# Patient Record
Sex: Female | Born: 1945 | Race: White | Hispanic: No | Smoking: Never smoker
Health system: Southern US, Community
[De-identification: ages and names within clinical notes are randomized; demographics above are authoritative.]

## PROBLEM LIST (undated history)

## (undated) DIAGNOSIS — I4891 Unspecified atrial fibrillation: Secondary | ICD-10-CM

## (undated) DIAGNOSIS — I10 Essential (primary) hypertension: Secondary | ICD-10-CM

## (undated) DIAGNOSIS — I499 Cardiac arrhythmia, unspecified: Secondary | ICD-10-CM

## (undated) DIAGNOSIS — M1990 Unspecified osteoarthritis, unspecified site: Secondary | ICD-10-CM

## (undated) DIAGNOSIS — F40240 Claustrophobia: Secondary | ICD-10-CM

## (undated) DIAGNOSIS — H9190 Unspecified hearing loss, unspecified ear: Secondary | ICD-10-CM

## (undated) DIAGNOSIS — Z87442 Personal history of urinary calculi: Secondary | ICD-10-CM

## (undated) HISTORY — PX: CHOLECYSTECTOMY: SHX55

---
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
## 2015-01-02 DEATH — deceased

## 2016-03-31 DIAGNOSIS — I129 Hypertensive chronic kidney disease with stage 1 through stage 4 chronic kidney disease, or unspecified chronic kidney disease: Secondary | ICD-10-CM | POA: Diagnosis not present

## 2016-03-31 DIAGNOSIS — G629 Polyneuropathy, unspecified: Secondary | ICD-10-CM | POA: Diagnosis not present

## 2016-03-31 DIAGNOSIS — Z23 Encounter for immunization: Secondary | ICD-10-CM | POA: Diagnosis not present

## 2016-03-31 DIAGNOSIS — Z6841 Body Mass Index (BMI) 40.0 and over, adult: Secondary | ICD-10-CM | POA: Diagnosis not present

## 2016-03-31 DIAGNOSIS — N182 Chronic kidney disease, stage 2 (mild): Secondary | ICD-10-CM | POA: Diagnosis not present

## 2016-04-05 DIAGNOSIS — G629 Polyneuropathy, unspecified: Secondary | ICD-10-CM | POA: Diagnosis not present

## 2016-04-05 DIAGNOSIS — Z6841 Body Mass Index (BMI) 40.0 and over, adult: Secondary | ICD-10-CM | POA: Diagnosis not present

## 2016-04-05 DIAGNOSIS — I129 Hypertensive chronic kidney disease with stage 1 through stage 4 chronic kidney disease, or unspecified chronic kidney disease: Secondary | ICD-10-CM | POA: Diagnosis not present

## 2016-04-05 DIAGNOSIS — Z79899 Other long term (current) drug therapy: Secondary | ICD-10-CM | POA: Diagnosis not present

## 2016-04-05 DIAGNOSIS — H9190 Unspecified hearing loss, unspecified ear: Secondary | ICD-10-CM | POA: Diagnosis not present

## 2016-04-05 DIAGNOSIS — E538 Deficiency of other specified B group vitamins: Secondary | ICD-10-CM | POA: Diagnosis not present

## 2016-04-05 DIAGNOSIS — N182 Chronic kidney disease, stage 2 (mild): Secondary | ICD-10-CM | POA: Diagnosis not present

## 2016-04-05 DIAGNOSIS — I4891 Unspecified atrial fibrillation: Secondary | ICD-10-CM | POA: Diagnosis not present

## 2016-06-30 DIAGNOSIS — I10 Essential (primary) hypertension: Secondary | ICD-10-CM | POA: Diagnosis not present

## 2016-08-31 DIAGNOSIS — E538 Deficiency of other specified B group vitamins: Secondary | ICD-10-CM | POA: Diagnosis not present

## 2016-08-31 DIAGNOSIS — E7800 Pure hypercholesterolemia, unspecified: Secondary | ICD-10-CM | POA: Diagnosis not present

## 2016-08-31 DIAGNOSIS — R350 Frequency of micturition: Secondary | ICD-10-CM | POA: Diagnosis not present

## 2016-08-31 DIAGNOSIS — I10 Essential (primary) hypertension: Secondary | ICD-10-CM | POA: Diagnosis not present

## 2016-09-19 DIAGNOSIS — Z1231 Encounter for screening mammogram for malignant neoplasm of breast: Secondary | ICD-10-CM | POA: Diagnosis not present

## 2017-02-28 DIAGNOSIS — I10 Essential (primary) hypertension: Secondary | ICD-10-CM | POA: Diagnosis not present

## 2017-02-28 DIAGNOSIS — N182 Chronic kidney disease, stage 2 (mild): Secondary | ICD-10-CM | POA: Diagnosis not present

## 2017-02-28 DIAGNOSIS — F339 Major depressive disorder, recurrent, unspecified: Secondary | ICD-10-CM | POA: Diagnosis not present

## 2017-02-28 DIAGNOSIS — E7800 Pure hypercholesterolemia, unspecified: Secondary | ICD-10-CM | POA: Diagnosis not present

## 2017-02-28 DIAGNOSIS — F330 Major depressive disorder, recurrent, mild: Secondary | ICD-10-CM | POA: Diagnosis not present

## 2017-02-28 DIAGNOSIS — F3160 Bipolar disorder, current episode mixed, unspecified: Secondary | ICD-10-CM | POA: Diagnosis not present

## 2017-02-28 DIAGNOSIS — I129 Hypertensive chronic kidney disease with stage 1 through stage 4 chronic kidney disease, or unspecified chronic kidney disease: Secondary | ICD-10-CM | POA: Diagnosis not present

## 2017-02-28 DIAGNOSIS — E538 Deficiency of other specified B group vitamins: Secondary | ICD-10-CM | POA: Diagnosis not present

## 2017-02-28 DIAGNOSIS — Z1379 Encounter for other screening for genetic and chromosomal anomalies: Secondary | ICD-10-CM | POA: Diagnosis not present

## 2017-02-28 DIAGNOSIS — M79609 Pain in unspecified limb: Secondary | ICD-10-CM | POA: Diagnosis not present

## 2017-04-06 DIAGNOSIS — H6122 Impacted cerumen, left ear: Secondary | ICD-10-CM | POA: Diagnosis not present

## 2017-08-23 DIAGNOSIS — E7800 Pure hypercholesterolemia, unspecified: Secondary | ICD-10-CM | POA: Diagnosis not present

## 2017-08-23 DIAGNOSIS — Z0000 Encounter for general adult medical examination without abnormal findings: Secondary | ICD-10-CM | POA: Diagnosis not present

## 2017-08-23 DIAGNOSIS — N182 Chronic kidney disease, stage 2 (mild): Secondary | ICD-10-CM | POA: Diagnosis not present

## 2017-08-23 DIAGNOSIS — I129 Hypertensive chronic kidney disease with stage 1 through stage 4 chronic kidney disease, or unspecified chronic kidney disease: Secondary | ICD-10-CM | POA: Diagnosis not present

## 2017-08-23 DIAGNOSIS — Z139 Encounter for screening, unspecified: Secondary | ICD-10-CM | POA: Diagnosis not present

## 2017-08-23 DIAGNOSIS — I4891 Unspecified atrial fibrillation: Secondary | ICD-10-CM | POA: Diagnosis not present

## 2017-08-23 DIAGNOSIS — Z1331 Encounter for screening for depression: Secondary | ICD-10-CM | POA: Diagnosis not present

## 2018-01-08 DIAGNOSIS — Z1231 Encounter for screening mammogram for malignant neoplasm of breast: Secondary | ICD-10-CM | POA: Diagnosis not present

## 2018-01-18 DIAGNOSIS — N6489 Other specified disorders of breast: Secondary | ICD-10-CM | POA: Diagnosis not present

## 2018-01-18 DIAGNOSIS — R918 Other nonspecific abnormal finding of lung field: Secondary | ICD-10-CM | POA: Diagnosis not present

## 2018-01-22 DIAGNOSIS — R928 Other abnormal and inconclusive findings on diagnostic imaging of breast: Secondary | ICD-10-CM | POA: Diagnosis not present

## 2018-01-22 DIAGNOSIS — N6489 Other specified disorders of breast: Secondary | ICD-10-CM | POA: Diagnosis not present

## 2018-02-28 DIAGNOSIS — I129 Hypertensive chronic kidney disease with stage 1 through stage 4 chronic kidney disease, or unspecified chronic kidney disease: Secondary | ICD-10-CM | POA: Diagnosis not present

## 2018-02-28 DIAGNOSIS — N182 Chronic kidney disease, stage 2 (mild): Secondary | ICD-10-CM | POA: Diagnosis not present

## 2018-02-28 DIAGNOSIS — Z23 Encounter for immunization: Secondary | ICD-10-CM | POA: Diagnosis not present

## 2018-02-28 DIAGNOSIS — R600 Localized edema: Secondary | ICD-10-CM | POA: Diagnosis not present

## 2018-02-28 DIAGNOSIS — E785 Hyperlipidemia, unspecified: Secondary | ICD-10-CM | POA: Diagnosis not present

## 2018-02-28 DIAGNOSIS — E7800 Pure hypercholesterolemia, unspecified: Secondary | ICD-10-CM | POA: Diagnosis not present

## 2018-03-19 DIAGNOSIS — Z1211 Encounter for screening for malignant neoplasm of colon: Secondary | ICD-10-CM | POA: Diagnosis not present

## 2018-03-19 DIAGNOSIS — Z1212 Encounter for screening for malignant neoplasm of rectum: Secondary | ICD-10-CM | POA: Diagnosis not present

## 2018-04-05 DIAGNOSIS — R600 Localized edema: Secondary | ICD-10-CM | POA: Diagnosis not present

## 2018-04-05 DIAGNOSIS — M7989 Other specified soft tissue disorders: Secondary | ICD-10-CM | POA: Diagnosis not present

## 2018-08-22 DIAGNOSIS — N182 Chronic kidney disease, stage 2 (mild): Secondary | ICD-10-CM | POA: Diagnosis not present

## 2018-08-22 DIAGNOSIS — E538 Deficiency of other specified B group vitamins: Secondary | ICD-10-CM | POA: Diagnosis not present

## 2018-08-22 DIAGNOSIS — E7800 Pure hypercholesterolemia, unspecified: Secondary | ICD-10-CM | POA: Diagnosis not present

## 2018-08-22 DIAGNOSIS — I129 Hypertensive chronic kidney disease with stage 1 through stage 4 chronic kidney disease, or unspecified chronic kidney disease: Secondary | ICD-10-CM | POA: Diagnosis not present

## 2018-08-22 DIAGNOSIS — I4891 Unspecified atrial fibrillation: Secondary | ICD-10-CM | POA: Diagnosis not present

## 2018-09-05 DIAGNOSIS — K625 Hemorrhage of anus and rectum: Secondary | ICD-10-CM | POA: Diagnosis not present

## 2018-09-05 DIAGNOSIS — I4891 Unspecified atrial fibrillation: Secondary | ICD-10-CM | POA: Diagnosis not present

## 2018-09-05 DIAGNOSIS — M170 Bilateral primary osteoarthritis of knee: Secondary | ICD-10-CM | POA: Diagnosis not present

## 2018-09-05 DIAGNOSIS — N183 Chronic kidney disease, stage 3 (moderate): Secondary | ICD-10-CM | POA: Diagnosis not present

## 2018-09-05 DIAGNOSIS — Z1331 Encounter for screening for depression: Secondary | ICD-10-CM | POA: Diagnosis not present

## 2018-09-17 DIAGNOSIS — R609 Edema, unspecified: Secondary | ICD-10-CM | POA: Diagnosis not present

## 2018-09-17 DIAGNOSIS — E278 Other specified disorders of adrenal gland: Secondary | ICD-10-CM | POA: Diagnosis not present

## 2018-09-17 DIAGNOSIS — K625 Hemorrhage of anus and rectum: Secondary | ICD-10-CM | POA: Diagnosis not present

## 2018-09-17 DIAGNOSIS — R262 Difficulty in walking, not elsewhere classified: Secondary | ICD-10-CM | POA: Diagnosis not present

## 2018-09-21 DIAGNOSIS — I129 Hypertensive chronic kidney disease with stage 1 through stage 4 chronic kidney disease, or unspecified chronic kidney disease: Secondary | ICD-10-CM | POA: Diagnosis not present

## 2018-09-21 DIAGNOSIS — I10 Essential (primary) hypertension: Secondary | ICD-10-CM | POA: Diagnosis not present

## 2018-09-21 DIAGNOSIS — N189 Chronic kidney disease, unspecified: Secondary | ICD-10-CM | POA: Diagnosis not present

## 2018-09-21 DIAGNOSIS — K649 Unspecified hemorrhoids: Secondary | ICD-10-CM | POA: Diagnosis not present

## 2018-09-21 DIAGNOSIS — Z7901 Long term (current) use of anticoagulants: Secondary | ICD-10-CM | POA: Diagnosis not present

## 2018-09-21 DIAGNOSIS — R609 Edema, unspecified: Secondary | ICD-10-CM | POA: Diagnosis not present

## 2018-09-21 DIAGNOSIS — R52 Pain, unspecified: Secondary | ICD-10-CM | POA: Diagnosis not present

## 2018-09-21 DIAGNOSIS — R58 Hemorrhage, not elsewhere classified: Secondary | ICD-10-CM | POA: Diagnosis not present

## 2018-09-21 DIAGNOSIS — I4891 Unspecified atrial fibrillation: Secondary | ICD-10-CM | POA: Diagnosis not present

## 2018-09-21 DIAGNOSIS — N938 Other specified abnormal uterine and vaginal bleeding: Secondary | ICD-10-CM | POA: Diagnosis not present

## 2018-09-24 DIAGNOSIS — Z7689 Persons encountering health services in other specified circumstances: Secondary | ICD-10-CM | POA: Diagnosis not present

## 2018-09-24 DIAGNOSIS — I482 Chronic atrial fibrillation, unspecified: Secondary | ICD-10-CM | POA: Diagnosis not present

## 2018-09-24 DIAGNOSIS — Z7189 Other specified counseling: Secondary | ICD-10-CM | POA: Diagnosis not present

## 2018-09-24 DIAGNOSIS — K649 Unspecified hemorrhoids: Secondary | ICD-10-CM | POA: Diagnosis not present

## 2018-11-30 DIAGNOSIS — I4891 Unspecified atrial fibrillation: Secondary | ICD-10-CM | POA: Diagnosis not present

## 2018-11-30 DIAGNOSIS — R609 Edema, unspecified: Secondary | ICD-10-CM | POA: Diagnosis not present

## 2018-11-30 DIAGNOSIS — I878 Other specified disorders of veins: Secondary | ICD-10-CM | POA: Diagnosis not present

## 2018-11-30 DIAGNOSIS — I872 Venous insufficiency (chronic) (peripheral): Secondary | ICD-10-CM | POA: Diagnosis not present

## 2018-12-28 DIAGNOSIS — Z6841 Body Mass Index (BMI) 40.0 and over, adult: Secondary | ICD-10-CM | POA: Diagnosis not present

## 2018-12-28 DIAGNOSIS — E876 Hypokalemia: Secondary | ICD-10-CM | POA: Diagnosis not present

## 2018-12-28 DIAGNOSIS — I4891 Unspecified atrial fibrillation: Secondary | ICD-10-CM

## 2018-12-28 DIAGNOSIS — I480 Paroxysmal atrial fibrillation: Secondary | ICD-10-CM | POA: Diagnosis not present

## 2018-12-28 DIAGNOSIS — I10 Essential (primary) hypertension: Secondary | ICD-10-CM

## 2018-12-28 DIAGNOSIS — I361 Nonrheumatic tricuspid (valve) insufficiency: Secondary | ICD-10-CM | POA: Diagnosis not present

## 2018-12-28 DIAGNOSIS — G4730 Sleep apnea, unspecified: Secondary | ICD-10-CM | POA: Diagnosis not present

## 2018-12-28 DIAGNOSIS — F419 Anxiety disorder, unspecified: Secondary | ICD-10-CM | POA: Diagnosis not present

## 2018-12-28 DIAGNOSIS — I50810 Right heart failure, unspecified: Secondary | ICD-10-CM | POA: Diagnosis not present

## 2018-12-28 DIAGNOSIS — L97919 Non-pressure chronic ulcer of unspecified part of right lower leg with unspecified severity: Secondary | ICD-10-CM | POA: Diagnosis not present

## 2018-12-28 DIAGNOSIS — I340 Nonrheumatic mitral (valve) insufficiency: Secondary | ICD-10-CM | POA: Diagnosis not present

## 2018-12-28 DIAGNOSIS — I110 Hypertensive heart disease with heart failure: Secondary | ICD-10-CM | POA: Diagnosis not present

## 2018-12-28 DIAGNOSIS — I517 Cardiomegaly: Secondary | ICD-10-CM | POA: Diagnosis not present

## 2018-12-28 DIAGNOSIS — I351 Nonrheumatic aortic (valve) insufficiency: Secondary | ICD-10-CM | POA: Diagnosis not present

## 2018-12-28 DIAGNOSIS — Z881 Allergy status to other antibiotic agents status: Secondary | ICD-10-CM | POA: Diagnosis not present

## 2018-12-28 DIAGNOSIS — L89890 Pressure ulcer of other site, unstageable: Secondary | ICD-10-CM | POA: Diagnosis not present

## 2018-12-28 DIAGNOSIS — J9811 Atelectasis: Secondary | ICD-10-CM | POA: Diagnosis not present

## 2018-12-28 DIAGNOSIS — Z888 Allergy status to other drugs, medicaments and biological substances status: Secondary | ICD-10-CM | POA: Diagnosis not present

## 2018-12-28 DIAGNOSIS — I83019 Varicose veins of right lower extremity with ulcer of unspecified site: Secondary | ICD-10-CM | POA: Diagnosis not present

## 2018-12-28 DIAGNOSIS — M1990 Unspecified osteoarthritis, unspecified site: Secondary | ICD-10-CM | POA: Diagnosis not present

## 2018-12-28 DIAGNOSIS — Z79899 Other long term (current) drug therapy: Secondary | ICD-10-CM | POA: Diagnosis not present

## 2018-12-28 DIAGNOSIS — N179 Acute kidney failure, unspecified: Secondary | ICD-10-CM | POA: Diagnosis not present

## 2018-12-28 DIAGNOSIS — H9190 Unspecified hearing loss, unspecified ear: Secondary | ICD-10-CM | POA: Diagnosis not present

## 2018-12-28 DIAGNOSIS — R600 Localized edema: Secondary | ICD-10-CM | POA: Diagnosis not present

## 2018-12-28 DIAGNOSIS — Z880 Allergy status to penicillin: Secondary | ICD-10-CM | POA: Diagnosis not present

## 2018-12-28 DIAGNOSIS — I4811 Longstanding persistent atrial fibrillation: Secondary | ICD-10-CM | POA: Diagnosis not present

## 2019-01-07 DIAGNOSIS — I129 Hypertensive chronic kidney disease with stage 1 through stage 4 chronic kidney disease, or unspecified chronic kidney disease: Secondary | ICD-10-CM | POA: Diagnosis not present

## 2019-01-07 DIAGNOSIS — L97819 Non-pressure chronic ulcer of other part of right lower leg with unspecified severity: Secondary | ICD-10-CM | POA: Diagnosis not present

## 2019-01-07 DIAGNOSIS — E876 Hypokalemia: Secondary | ICD-10-CM | POA: Diagnosis not present

## 2019-01-07 DIAGNOSIS — G629 Polyneuropathy, unspecified: Secondary | ICD-10-CM | POA: Diagnosis not present

## 2019-01-07 DIAGNOSIS — H9190 Unspecified hearing loss, unspecified ear: Secondary | ICD-10-CM | POA: Diagnosis not present

## 2019-01-07 DIAGNOSIS — N182 Chronic kidney disease, stage 2 (mild): Secondary | ICD-10-CM | POA: Diagnosis not present

## 2019-01-07 DIAGNOSIS — I480 Paroxysmal atrial fibrillation: Secondary | ICD-10-CM | POA: Diagnosis not present

## 2019-01-07 DIAGNOSIS — N200 Calculus of kidney: Secondary | ICD-10-CM | POA: Diagnosis not present

## 2019-01-07 DIAGNOSIS — Z7982 Long term (current) use of aspirin: Secondary | ICD-10-CM | POA: Diagnosis not present

## 2019-01-07 DIAGNOSIS — N179 Acute kidney failure, unspecified: Secondary | ICD-10-CM | POA: Diagnosis not present

## 2019-01-07 DIAGNOSIS — I872 Venous insufficiency (chronic) (peripheral): Secondary | ICD-10-CM | POA: Diagnosis not present

## 2019-01-14 DIAGNOSIS — Z7689 Persons encountering health services in other specified circumstances: Secondary | ICD-10-CM | POA: Diagnosis not present

## 2019-01-14 DIAGNOSIS — N183 Chronic kidney disease, stage 3 (moderate): Secondary | ICD-10-CM | POA: Diagnosis not present

## 2019-01-14 DIAGNOSIS — E785 Hyperlipidemia, unspecified: Secondary | ICD-10-CM | POA: Diagnosis not present

## 2019-01-14 DIAGNOSIS — E876 Hypokalemia: Secondary | ICD-10-CM | POA: Diagnosis not present

## 2019-01-14 DIAGNOSIS — I872 Venous insufficiency (chronic) (peripheral): Secondary | ICD-10-CM | POA: Diagnosis not present

## 2019-01-14 DIAGNOSIS — E538 Deficiency of other specified B group vitamins: Secondary | ICD-10-CM | POA: Diagnosis not present

## 2019-01-14 DIAGNOSIS — N179 Acute kidney failure, unspecified: Secondary | ICD-10-CM | POA: Diagnosis not present

## 2019-01-25 DIAGNOSIS — N183 Chronic kidney disease, stage 3 (moderate): Secondary | ICD-10-CM | POA: Diagnosis not present

## 2019-01-25 DIAGNOSIS — R609 Edema, unspecified: Secondary | ICD-10-CM | POA: Diagnosis not present

## 2019-01-25 DIAGNOSIS — Z6841 Body Mass Index (BMI) 40.0 and over, adult: Secondary | ICD-10-CM | POA: Diagnosis not present

## 2019-01-25 DIAGNOSIS — E876 Hypokalemia: Secondary | ICD-10-CM | POA: Diagnosis not present

## 2019-02-01 ENCOUNTER — Other Ambulatory Visit: Payer: Self-pay

## 2019-02-06 DIAGNOSIS — I872 Venous insufficiency (chronic) (peripheral): Secondary | ICD-10-CM | POA: Diagnosis not present

## 2019-02-06 DIAGNOSIS — I129 Hypertensive chronic kidney disease with stage 1 through stage 4 chronic kidney disease, or unspecified chronic kidney disease: Secondary | ICD-10-CM | POA: Diagnosis not present

## 2019-02-06 DIAGNOSIS — L97819 Non-pressure chronic ulcer of other part of right lower leg with unspecified severity: Secondary | ICD-10-CM | POA: Diagnosis not present

## 2019-02-06 DIAGNOSIS — H9190 Unspecified hearing loss, unspecified ear: Secondary | ICD-10-CM | POA: Diagnosis not present

## 2019-02-06 DIAGNOSIS — N179 Acute kidney failure, unspecified: Secondary | ICD-10-CM | POA: Diagnosis not present

## 2019-02-06 DIAGNOSIS — I480 Paroxysmal atrial fibrillation: Secondary | ICD-10-CM | POA: Diagnosis not present

## 2019-02-06 DIAGNOSIS — E876 Hypokalemia: Secondary | ICD-10-CM | POA: Diagnosis not present

## 2019-02-06 DIAGNOSIS — Z7982 Long term (current) use of aspirin: Secondary | ICD-10-CM | POA: Diagnosis not present

## 2019-02-06 DIAGNOSIS — N200 Calculus of kidney: Secondary | ICD-10-CM | POA: Diagnosis not present

## 2019-02-06 DIAGNOSIS — G629 Polyneuropathy, unspecified: Secondary | ICD-10-CM | POA: Diagnosis not present

## 2019-02-06 DIAGNOSIS — N182 Chronic kidney disease, stage 2 (mild): Secondary | ICD-10-CM | POA: Diagnosis not present

## 2019-02-22 DIAGNOSIS — M170 Bilateral primary osteoarthritis of knee: Secondary | ICD-10-CM | POA: Diagnosis not present

## 2019-02-22 DIAGNOSIS — I10 Essential (primary) hypertension: Secondary | ICD-10-CM | POA: Diagnosis not present

## 2019-02-22 DIAGNOSIS — G629 Polyneuropathy, unspecified: Secondary | ICD-10-CM | POA: Diagnosis not present

## 2019-02-22 DIAGNOSIS — L97812 Non-pressure chronic ulcer of other part of right lower leg with fat layer exposed: Secondary | ICD-10-CM | POA: Diagnosis not present

## 2019-02-22 DIAGNOSIS — I87311 Chronic venous hypertension (idiopathic) with ulcer of right lower extremity: Secondary | ICD-10-CM | POA: Diagnosis not present

## 2019-03-01 DIAGNOSIS — I87311 Chronic venous hypertension (idiopathic) with ulcer of right lower extremity: Secondary | ICD-10-CM | POA: Diagnosis not present

## 2019-03-01 DIAGNOSIS — L97812 Non-pressure chronic ulcer of other part of right lower leg with fat layer exposed: Secondary | ICD-10-CM | POA: Diagnosis not present

## 2019-03-08 DIAGNOSIS — L97812 Non-pressure chronic ulcer of other part of right lower leg with fat layer exposed: Secondary | ICD-10-CM | POA: Diagnosis not present

## 2019-03-08 DIAGNOSIS — L97512 Non-pressure chronic ulcer of other part of right foot with fat layer exposed: Secondary | ICD-10-CM | POA: Diagnosis not present

## 2019-03-08 DIAGNOSIS — I87311 Chronic venous hypertension (idiopathic) with ulcer of right lower extremity: Secondary | ICD-10-CM | POA: Diagnosis not present

## 2019-03-15 DIAGNOSIS — I87311 Chronic venous hypertension (idiopathic) with ulcer of right lower extremity: Secondary | ICD-10-CM | POA: Diagnosis not present

## 2019-03-15 DIAGNOSIS — L97812 Non-pressure chronic ulcer of other part of right lower leg with fat layer exposed: Secondary | ICD-10-CM | POA: Diagnosis not present

## 2019-04-05 DIAGNOSIS — L97812 Non-pressure chronic ulcer of other part of right lower leg with fat layer exposed: Secondary | ICD-10-CM | POA: Diagnosis not present

## 2019-04-05 DIAGNOSIS — I87311 Chronic venous hypertension (idiopathic) with ulcer of right lower extremity: Secondary | ICD-10-CM | POA: Diagnosis not present

## 2019-04-09 DIAGNOSIS — I87311 Chronic venous hypertension (idiopathic) with ulcer of right lower extremity: Secondary | ICD-10-CM | POA: Diagnosis not present

## 2019-04-09 DIAGNOSIS — L97812 Non-pressure chronic ulcer of other part of right lower leg with fat layer exposed: Secondary | ICD-10-CM | POA: Diagnosis not present

## 2019-05-03 DIAGNOSIS — I87311 Chronic venous hypertension (idiopathic) with ulcer of right lower extremity: Secondary | ICD-10-CM | POA: Diagnosis not present

## 2019-05-03 DIAGNOSIS — L97819 Non-pressure chronic ulcer of other part of right lower leg with unspecified severity: Secondary | ICD-10-CM | POA: Diagnosis not present

## 2019-05-10 DIAGNOSIS — L97812 Non-pressure chronic ulcer of other part of right lower leg with fat layer exposed: Secondary | ICD-10-CM | POA: Diagnosis not present

## 2019-05-10 DIAGNOSIS — I87331 Chronic venous hypertension (idiopathic) with ulcer and inflammation of right lower extremity: Secondary | ICD-10-CM | POA: Diagnosis not present

## 2019-05-10 DIAGNOSIS — I87311 Chronic venous hypertension (idiopathic) with ulcer of right lower extremity: Secondary | ICD-10-CM | POA: Diagnosis not present

## 2019-05-23 DIAGNOSIS — I87311 Chronic venous hypertension (idiopathic) with ulcer of right lower extremity: Secondary | ICD-10-CM | POA: Diagnosis not present

## 2019-05-23 DIAGNOSIS — L97812 Non-pressure chronic ulcer of other part of right lower leg with fat layer exposed: Secondary | ICD-10-CM | POA: Diagnosis not present

## 2019-06-04 DIAGNOSIS — I87311 Chronic venous hypertension (idiopathic) with ulcer of right lower extremity: Secondary | ICD-10-CM | POA: Diagnosis not present

## 2019-06-04 DIAGNOSIS — L97812 Non-pressure chronic ulcer of other part of right lower leg with fat layer exposed: Secondary | ICD-10-CM | POA: Diagnosis not present

## 2019-06-04 DIAGNOSIS — L97819 Non-pressure chronic ulcer of other part of right lower leg with unspecified severity: Secondary | ICD-10-CM | POA: Diagnosis not present

## 2019-06-07 DIAGNOSIS — I87311 Chronic venous hypertension (idiopathic) with ulcer of right lower extremity: Secondary | ICD-10-CM | POA: Diagnosis not present

## 2019-06-10 DIAGNOSIS — L97819 Non-pressure chronic ulcer of other part of right lower leg with unspecified severity: Secondary | ICD-10-CM | POA: Diagnosis not present

## 2019-06-10 DIAGNOSIS — I87311 Chronic venous hypertension (idiopathic) with ulcer of right lower extremity: Secondary | ICD-10-CM | POA: Diagnosis not present

## 2019-07-01 DIAGNOSIS — Z136 Encounter for screening for cardiovascular disorders: Secondary | ICD-10-CM | POA: Diagnosis not present

## 2019-07-01 DIAGNOSIS — Z23 Encounter for immunization: Secondary | ICD-10-CM | POA: Diagnosis not present

## 2019-07-01 DIAGNOSIS — Z0000 Encounter for general adult medical examination without abnormal findings: Secondary | ICD-10-CM | POA: Diagnosis not present

## 2019-07-01 DIAGNOSIS — Z1231 Encounter for screening mammogram for malignant neoplasm of breast: Secondary | ICD-10-CM | POA: Diagnosis not present

## 2019-07-01 DIAGNOSIS — Z139 Encounter for screening, unspecified: Secondary | ICD-10-CM | POA: Diagnosis not present

## 2019-07-01 DIAGNOSIS — Z1331 Encounter for screening for depression: Secondary | ICD-10-CM | POA: Diagnosis not present

## 2019-07-01 DIAGNOSIS — Z1339 Encounter for screening examination for other mental health and behavioral disorders: Secondary | ICD-10-CM | POA: Diagnosis not present

## 2019-07-03 DIAGNOSIS — N183 Chronic kidney disease, stage 3 unspecified: Secondary | ICD-10-CM | POA: Diagnosis not present

## 2019-07-03 DIAGNOSIS — Z6841 Body Mass Index (BMI) 40.0 and over, adult: Secondary | ICD-10-CM | POA: Diagnosis not present

## 2019-07-03 DIAGNOSIS — I872 Venous insufficiency (chronic) (peripheral): Secondary | ICD-10-CM | POA: Diagnosis not present

## 2019-07-03 DIAGNOSIS — E876 Hypokalemia: Secondary | ICD-10-CM | POA: Diagnosis not present

## 2019-07-11 DIAGNOSIS — I87311 Chronic venous hypertension (idiopathic) with ulcer of right lower extremity: Secondary | ICD-10-CM | POA: Diagnosis not present

## 2019-07-17 DIAGNOSIS — L97812 Non-pressure chronic ulcer of other part of right lower leg with fat layer exposed: Secondary | ICD-10-CM | POA: Diagnosis not present

## 2019-07-17 DIAGNOSIS — I87311 Chronic venous hypertension (idiopathic) with ulcer of right lower extremity: Secondary | ICD-10-CM | POA: Diagnosis not present

## 2019-07-31 DIAGNOSIS — I87311 Chronic venous hypertension (idiopathic) with ulcer of right lower extremity: Secondary | ICD-10-CM | POA: Diagnosis not present

## 2019-07-31 DIAGNOSIS — L97812 Non-pressure chronic ulcer of other part of right lower leg with fat layer exposed: Secondary | ICD-10-CM | POA: Diagnosis not present

## 2019-08-02 DIAGNOSIS — E876 Hypokalemia: Secondary | ICD-10-CM | POA: Diagnosis not present

## 2019-08-02 DIAGNOSIS — N183 Chronic kidney disease, stage 3 unspecified: Secondary | ICD-10-CM | POA: Diagnosis not present

## 2019-08-02 DIAGNOSIS — I872 Venous insufficiency (chronic) (peripheral): Secondary | ICD-10-CM | POA: Diagnosis not present

## 2019-08-19 DIAGNOSIS — I10 Essential (primary) hypertension: Secondary | ICD-10-CM | POA: Diagnosis not present

## 2019-08-19 DIAGNOSIS — Z6841 Body Mass Index (BMI) 40.0 and over, adult: Secondary | ICD-10-CM | POA: Diagnosis not present

## 2019-08-27 DIAGNOSIS — I10 Essential (primary) hypertension: Secondary | ICD-10-CM | POA: Diagnosis not present

## 2019-09-01 DIAGNOSIS — N183 Chronic kidney disease, stage 3 unspecified: Secondary | ICD-10-CM | POA: Diagnosis not present

## 2019-09-01 DIAGNOSIS — I10 Essential (primary) hypertension: Secondary | ICD-10-CM | POA: Diagnosis not present

## 2019-09-01 DIAGNOSIS — I872 Venous insufficiency (chronic) (peripheral): Secondary | ICD-10-CM | POA: Diagnosis not present

## 2019-09-01 DIAGNOSIS — E876 Hypokalemia: Secondary | ICD-10-CM | POA: Diagnosis not present

## 2019-09-02 DIAGNOSIS — Z6841 Body Mass Index (BMI) 40.0 and over, adult: Secondary | ICD-10-CM | POA: Diagnosis not present

## 2019-09-02 DIAGNOSIS — I10 Essential (primary) hypertension: Secondary | ICD-10-CM | POA: Diagnosis not present

## 2019-09-12 DIAGNOSIS — I87311 Chronic venous hypertension (idiopathic) with ulcer of right lower extremity: Secondary | ICD-10-CM | POA: Diagnosis not present

## 2019-09-12 DIAGNOSIS — L97819 Non-pressure chronic ulcer of other part of right lower leg with unspecified severity: Secondary | ICD-10-CM | POA: Diagnosis not present

## 2019-09-18 DIAGNOSIS — Z1231 Encounter for screening mammogram for malignant neoplasm of breast: Secondary | ICD-10-CM | POA: Diagnosis not present

## 2019-10-01 DIAGNOSIS — I10 Essential (primary) hypertension: Secondary | ICD-10-CM | POA: Diagnosis not present

## 2019-10-02 DIAGNOSIS — N183 Chronic kidney disease, stage 3 unspecified: Secondary | ICD-10-CM | POA: Diagnosis not present

## 2019-10-02 DIAGNOSIS — I10 Essential (primary) hypertension: Secondary | ICD-10-CM | POA: Diagnosis not present

## 2019-10-02 DIAGNOSIS — I872 Venous insufficiency (chronic) (peripheral): Secondary | ICD-10-CM | POA: Diagnosis not present

## 2019-10-02 DIAGNOSIS — E876 Hypokalemia: Secondary | ICD-10-CM | POA: Diagnosis not present

## 2019-10-17 ENCOUNTER — Other Ambulatory Visit: Payer: Self-pay | Admitting: Family Medicine

## 2019-10-17 DIAGNOSIS — N6489 Other specified disorders of breast: Secondary | ICD-10-CM | POA: Diagnosis not present

## 2019-10-17 DIAGNOSIS — R928 Other abnormal and inconclusive findings on diagnostic imaging of breast: Secondary | ICD-10-CM

## 2019-10-30 ENCOUNTER — Ambulatory Visit
Admission: RE | Admit: 2019-10-30 | Discharge: 2019-10-30 | Disposition: A | Discharge status: Home / Self Care | Payer: PPO | Source: Ambulatory Visit | Attending: Family Medicine | Admitting: Family Medicine

## 2019-10-30 ENCOUNTER — Other Ambulatory Visit: Payer: Self-pay | Admitting: Family Medicine

## 2019-10-30 ENCOUNTER — Other Ambulatory Visit: Payer: Self-pay

## 2019-10-30 DIAGNOSIS — N6310 Unspecified lump in the right breast, unspecified quadrant: Secondary | ICD-10-CM

## 2019-10-30 DIAGNOSIS — R928 Other abnormal and inconclusive findings on diagnostic imaging of breast: Secondary | ICD-10-CM

## 2019-11-01 DIAGNOSIS — E876 Hypokalemia: Secondary | ICD-10-CM | POA: Diagnosis not present

## 2019-11-01 DIAGNOSIS — M25551 Pain in right hip: Secondary | ICD-10-CM | POA: Diagnosis not present

## 2019-11-01 DIAGNOSIS — I10 Essential (primary) hypertension: Secondary | ICD-10-CM | POA: Diagnosis not present

## 2019-11-01 DIAGNOSIS — N183 Chronic kidney disease, stage 3 unspecified: Secondary | ICD-10-CM | POA: Diagnosis not present

## 2019-11-01 DIAGNOSIS — I872 Venous insufficiency (chronic) (peripheral): Secondary | ICD-10-CM | POA: Diagnosis not present

## 2019-11-04 DIAGNOSIS — N183 Chronic kidney disease, stage 3 unspecified: Secondary | ICD-10-CM | POA: Diagnosis not present

## 2019-11-04 DIAGNOSIS — I10 Essential (primary) hypertension: Secondary | ICD-10-CM | POA: Diagnosis not present

## 2019-11-04 DIAGNOSIS — Z6841 Body Mass Index (BMI) 40.0 and over, adult: Secondary | ICD-10-CM | POA: Diagnosis not present

## 2019-11-08 ENCOUNTER — Emergency Department (HOSPITAL_COMMUNITY): Payer: PPO

## 2019-11-08 ENCOUNTER — Inpatient Hospital Stay (HOSPITAL_COMMUNITY)
Admission: EM | Admit: 2019-11-08 | Discharge: 2019-11-12 | DRG: 660 | Disposition: A | Discharge status: Home Health | Payer: PPO | Source: Ambulatory Visit | Attending: Internal Medicine | Admitting: Internal Medicine

## 2019-11-08 ENCOUNTER — Other Ambulatory Visit: Payer: Self-pay

## 2019-11-08 ENCOUNTER — Encounter (HOSPITAL_COMMUNITY): Payer: Self-pay | Admitting: Emergency Medicine

## 2019-11-08 DIAGNOSIS — E872 Acidosis, unspecified: Secondary | ICD-10-CM

## 2019-11-08 DIAGNOSIS — Z888 Allergy status to other drugs, medicaments and biological substances status: Secondary | ICD-10-CM | POA: Diagnosis not present

## 2019-11-08 DIAGNOSIS — I1310 Hypertensive heart and chronic kidney disease without heart failure, with stage 1 through stage 4 chronic kidney disease, or unspecified chronic kidney disease: Secondary | ICD-10-CM | POA: Diagnosis present

## 2019-11-08 DIAGNOSIS — Z880 Allergy status to penicillin: Secondary | ICD-10-CM | POA: Diagnosis not present

## 2019-11-08 DIAGNOSIS — R5381 Other malaise: Secondary | ICD-10-CM | POA: Diagnosis not present

## 2019-11-08 DIAGNOSIS — N179 Acute kidney failure, unspecified: Principal | ICD-10-CM | POA: Diagnosis present

## 2019-11-08 DIAGNOSIS — N189 Chronic kidney disease, unspecified: Secondary | ICD-10-CM | POA: Diagnosis present

## 2019-11-08 DIAGNOSIS — R1031 Right lower quadrant pain: Secondary | ICD-10-CM | POA: Diagnosis present

## 2019-11-08 DIAGNOSIS — H9190 Unspecified hearing loss, unspecified ear: Secondary | ICD-10-CM | POA: Diagnosis not present

## 2019-11-08 DIAGNOSIS — J90 Pleural effusion, not elsewhere classified: Secondary | ICD-10-CM | POA: Diagnosis not present

## 2019-11-08 DIAGNOSIS — Z9181 History of falling: Secondary | ICD-10-CM

## 2019-11-08 DIAGNOSIS — Z79899 Other long term (current) drug therapy: Secondary | ICD-10-CM | POA: Diagnosis not present

## 2019-11-08 DIAGNOSIS — Z6839 Body mass index (BMI) 39.0-39.9, adult: Secondary | ICD-10-CM | POA: Diagnosis not present

## 2019-11-08 DIAGNOSIS — N183 Chronic kidney disease, stage 3 unspecified: Secondary | ICD-10-CM | POA: Diagnosis not present

## 2019-11-08 DIAGNOSIS — Z91041 Radiographic dye allergy status: Secondary | ICD-10-CM

## 2019-11-08 DIAGNOSIS — N132 Hydronephrosis with renal and ureteral calculous obstruction: Secondary | ICD-10-CM | POA: Diagnosis present

## 2019-11-08 DIAGNOSIS — N3281 Overactive bladder: Secondary | ICD-10-CM | POA: Diagnosis present

## 2019-11-08 DIAGNOSIS — Z91040 Latex allergy status: Secondary | ICD-10-CM

## 2019-11-08 DIAGNOSIS — N200 Calculus of kidney: Secondary | ICD-10-CM | POA: Diagnosis not present

## 2019-11-08 DIAGNOSIS — I5033 Acute on chronic diastolic (congestive) heart failure: Secondary | ICD-10-CM | POA: Diagnosis not present

## 2019-11-08 DIAGNOSIS — Z20822 Contact with and (suspected) exposure to covid-19: Secondary | ICD-10-CM | POA: Diagnosis present

## 2019-11-08 DIAGNOSIS — N139 Obstructive and reflux uropathy, unspecified: Secondary | ICD-10-CM | POA: Diagnosis not present

## 2019-11-08 DIAGNOSIS — I4510 Unspecified right bundle-branch block: Secondary | ICD-10-CM | POA: Diagnosis present

## 2019-11-08 DIAGNOSIS — Z7982 Long term (current) use of aspirin: Secondary | ICD-10-CM

## 2019-11-08 DIAGNOSIS — I4891 Unspecified atrial fibrillation: Secondary | ICD-10-CM | POA: Diagnosis not present

## 2019-11-08 DIAGNOSIS — E8770 Fluid overload, unspecified: Secondary | ICD-10-CM | POA: Diagnosis not present

## 2019-11-08 DIAGNOSIS — N3941 Urge incontinence: Secondary | ICD-10-CM | POA: Diagnosis present

## 2019-11-08 DIAGNOSIS — J9811 Atelectasis: Secondary | ICD-10-CM | POA: Diagnosis not present

## 2019-11-08 DIAGNOSIS — N1330 Unspecified hydronephrosis: Secondary | ICD-10-CM

## 2019-11-08 DIAGNOSIS — Z03818 Encounter for observation for suspected exposure to other biological agents ruled out: Secondary | ICD-10-CM | POA: Diagnosis not present

## 2019-11-08 DIAGNOSIS — R0602 Shortness of breath: Secondary | ICD-10-CM | POA: Diagnosis not present

## 2019-11-08 DIAGNOSIS — E875 Hyperkalemia: Secondary | ICD-10-CM | POA: Diagnosis not present

## 2019-11-08 DIAGNOSIS — Z881 Allergy status to other antibiotic agents status: Secondary | ICD-10-CM

## 2019-11-08 DIAGNOSIS — R34 Anuria and oliguria: Secondary | ICD-10-CM | POA: Diagnosis not present

## 2019-11-08 DIAGNOSIS — N201 Calculus of ureter: Secondary | ICD-10-CM | POA: Diagnosis not present

## 2019-11-08 HISTORY — DX: Unspecified atrial fibrillation: I48.91

## 2019-11-08 HISTORY — DX: Unspecified hearing loss, unspecified ear: H91.90

## 2019-11-08 LAB — URINALYSIS, ROUTINE W REFLEX MICROSCOPIC
Bilirubin Urine: NEGATIVE
Glucose, UA: NEGATIVE mg/dL
Ketones, ur: NEGATIVE mg/dL
Nitrite: NEGATIVE
Protein, ur: 100 mg/dL — AB
RBC / HPF: >50 RBC/hpf — ABNORMAL HIGH (ref 0–5)
Specific Gravity, Urine: 1.012 (ref 1.005–1.030)
pH: 5 (ref 5.0–8.0)

## 2019-11-08 LAB — COMPREHENSIVE METABOLIC PANEL
ALT: 14 U/L (ref 0–44)
AST: 14 U/L — ABNORMAL LOW (ref 15–41)
Albumin: 3.3 g/dL — ABNORMAL LOW (ref 3.5–5.0)
Alkaline Phosphatase: 66 U/L (ref 38–126)
Anion gap: 11 (ref 5–15)
BUN: 98 mg/dL — ABNORMAL HIGH (ref 8–23)
CO2: 21 mmol/L — ABNORMAL LOW (ref 22–32)
Calcium: 8.7 mg/dL — ABNORMAL LOW (ref 8.9–10.3)
Chloride: 109 mmol/L (ref 98–111)
Creatinine, Ser: 5.23 mg/dL — ABNORMAL HIGH (ref 0.44–1.00)
GFR calc Af Amer: 9 mL/min — ABNORMAL LOW (ref >60)
GFR calc non Af Amer: 8 mL/min — ABNORMAL LOW (ref >60)
Glucose, Bld: 101 mg/dL — ABNORMAL HIGH (ref 70–99)
Potassium: 5.6 mmol/L — ABNORMAL HIGH (ref 3.5–5.1)
Sodium: 141 mmol/L (ref 135–145)
Total Bilirubin: 0.6 mg/dL (ref 0.3–1.2)
Total Protein: 6.9 g/dL (ref 6.5–8.1)

## 2019-11-08 LAB — CBC WITH DIFFERENTIAL/PLATELET
Abs Immature Granulocytes: 0.02 10*3/uL (ref 0.00–0.07)
Basophils Absolute: 0 10*3/uL (ref 0.0–0.1)
Basophils Relative: 0 %
Eosinophils Absolute: 0 10*3/uL (ref 0.0–0.5)
Eosinophils Relative: 0 %
HCT: 40 % (ref 36.0–46.0)
Hemoglobin: 12.4 g/dL (ref 12.0–15.0)
Immature Granulocytes: 0 %
Lymphocytes Relative: 5 %
Lymphs Abs: 0.4 10*3/uL — ABNORMAL LOW (ref 0.7–4.0)
MCH: 31.8 pg (ref 26.0–34.0)
MCHC: 31 g/dL (ref 30.0–36.0)
MCV: 102.6 fL — ABNORMAL HIGH (ref 80.0–100.0)
Monocytes Absolute: 0.7 10*3/uL (ref 0.1–1.0)
Monocytes Relative: 7 %
Neutro Abs: 7.9 10*3/uL — ABNORMAL HIGH (ref 1.7–7.7)
Neutrophils Relative %: 88 %
Platelets: 260 10*3/uL (ref 150–400)
RBC: 3.9 MIL/uL (ref 3.87–5.11)
RDW: 13.1 % (ref 11.5–15.5)
WBC: 9 10*3/uL (ref 4.0–10.5)
nRBC: 0 % (ref 0.0–0.2)

## 2019-11-08 LAB — MAGNESIUM: Magnesium: 2.1 mg/dL (ref 1.7–2.4)

## 2019-11-08 LAB — PHOSPHORUS: Phosphorus: 6.6 mg/dL — ABNORMAL HIGH (ref 2.5–4.6)

## 2019-11-08 LAB — BRAIN NATRIURETIC PEPTIDE: B Natriuretic Peptide: 345.3 pg/mL — ABNORMAL HIGH (ref 0.0–100.0)

## 2019-11-08 MED ORDER — SODIUM ZIRCONIUM CYCLOSILICATE 10 G PO PACK
10.0000 g | PACK | Freq: Once | ORAL | Status: AC
  Administered 2019-11-08: 10 g via ORAL
  Filled 2019-11-08: qty 1

## 2019-11-08 NOTE — H&P (View-Only) (Signed)
Acute renal failure w/ oliguria.  CT shows bilateral UPJ stones with proximal hydronephrosis.  No evidence of infection.  Pain controlled.  Plan to place stents bilaterally in the AM.  Please make the patient NPO p MN.

## 2019-11-08 NOTE — ED Notes (Signed)
Pure wick has been placed. Suction set to 45mmHg.  

## 2019-11-08 NOTE — ED Provider Notes (Signed)
[facility] DEPT Provider Note   CSN: [id number] Arrival date & time: 11/08/19  1608     History Chief Complaint  Patient presents with  . Abnormal Lab    Gloria [name] is a 74 y.o. female.  Gloria [name] is a 75 y.o. female who is very hard of hearing with history of A. fib, CHF, obesity, who presents to the ED for evaluation of abnormal labs.  Due to patient's hearing impairment and anxiety she is able to provide limited information, she is able to read lips to communicate.  Her brother is at the bedside and helps to care for her, although patient lives at home by herself.  He states that he took her to a routine checkup appointment earlier this week where they did lab work and adjusted her carvedilol notes lab work came back with a creatinine of 7 and a GFR of 8.  Patient's PCP, Dr. Maryella Shivers was very concerned by this, have her come back in today for repeat lab work which again showed an elevated creatinine and low GFR.  For the past few years patient has had stable GFR is between 48-56, so this was very unexpected.  Patient has not had any recent medication changes or additions.  She does take a small dose of a diuretic as needed for swelling in her legs.  States she is not been taking it much lately, does have worsening swelling in her legs, previously had a chronic wound that has since healed.  Patient states she has been eating and drinking normally.  No chest pain, shortness of breath, abdominal pain, vomiting or diarrhea.  Denies any flank pain, has not noticed blood in her urine.  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        Past Medical History:  Diagnosis Date  . Atrial fibrillation ([place])   . Claustrophobia   . HOH (hard of hearing)     Patient Active Problem List   Diagnosis Date Noted  . Acute renal failure (ARF)  ([place]) 11/08/2019    History reviewed. No pertinent surgical history.   OB History   No obstetric history on file.     History reviewed. No pertinent family history.  Social History   Tobacco Use  . Smoking status: Not on file  Substance Use Topics  . Alcohol use: Not on file  . Drug use: Not on file    Home Medications Prior to Admission medications   Medication Sig Start Date End Date Taking? Authorizing Provider  aspirin 325 MG tablet Take 325 mg by mouth daily.   Yes [provider]  carvedilol (COREG) 6.25 MG tablet Take 6.25 mg by mouth 2 (two) times daily. 11/08/19  Yes [provider]  furosemide (LASIX) 20 MG tablet Take 20 mg by mouth as needed for fluid.   Yes [provider]  meclizine (ANTIVERT) 25 MG tablet Take 25 mg by mouth as needed for dizziness. 08/17/13  Yes [provider]    Allergies    Aspartame and phenylalanine, Azithromycin, Bee venom, Cortizone-5 [hydrocortisone], Latex, Olmesartan, Other, Penicillins, and Valsartan  Review of Systems   Review of Systems  Constitutional: Negative for chills and fever.  HENT: Negative.   Respiratory: Negative for cough and shortness of breath.   Gastrointestinal: Negative for abdominal pain, diarrhea, nausea and vomiting.  Genitourinary: Positive for difficulty urinating. Negative for dysuria.  Musculoskeletal: Positive for back pain.  Skin: Negative for color change and rash.  Neurological: Negative for dizziness, syncope and light-headedness.    Physical Exam Updated Vital Signs BP (!) 165/82 (BP Location: Left Arm)   Pulse (!) 57 Comment: Pt family expressed baseline lower HR.  Temp 97.6 F (36.4 C) (Oral)   Resp 16   Ht 5\' 3"  (1.6 m)   Wt 102.1 kg   SpO2 97%   BMI 39.86 kg/m   Physical Exam Vitals and nursing note reviewed.  Constitutional:      General: She is not in acute distress.    Appearance: She is well-developed. She is obese. She is not diaphoretic.       Comments: Chronically ill-appearing obese, elderly female  HENT:     Head: Normocephalic and atraumatic.  Eyes:     General:        Right eye: No discharge.        Left eye: No discharge.     Pupils: Pupils are equal, round, and reactive to light.  Cardiovascular:     Rate and Rhythm: Normal rate and regular rhythm.     Heart sounds: Normal heart sounds.  Pulmonary:     Effort: Pulmonary effort is normal. No respiratory distress.     Breath sounds: Normal breath sounds. No wheezing or rales.     Comments: Respirations equal and unlabored, patient able to speak in full sentences, lungs clear to auscultation bilaterally, no crackles or wheezes noted  Abdominal:     General: Bowel sounds are normal. There is no distension.     Palpations: Abdomen is soft. There is no mass.     Tenderness: There is no abdominal tenderness. There is no guarding.     Comments: Abdomen soft, nondistended, nontender to palpation in all quadrants without guarding or peritoneal sign  Musculoskeletal:        General: No deformity.     Cervical back: Neck supple.     Right lower leg: Edema present.     Left lower leg: Edema present.     Comments: Bilateral pitting lower extremity edema with well-healed chronic wound on the medial aspect of the right lower leg  Skin:    General: Skin is warm and dry.     Capillary Refill: Capillary refill takes less than 2 seconds.  Neurological:     Mental Status: She is alert.     Coordination: Coordination normal.     Comments: Speech is clear, able to follow commands CN III-XII intact Normal strength in upper and lower extremities bilaterally including dorsiflexion and plantar flexion, strong and equal grip strength Sensation normal to light and sharp touch Moves extremities without ataxia, coordination intact      ED Results / Procedures / Treatments   Labs (all labs ordered are listed, but only abnormal results are displayed) Labs Reviewed  COMPREHENSIVE  METABOLIC PANEL - Abnormal; Notable for the following components:      Result Value   Potassium 5.6 (*)    CO2 21 (*)    Glucose, Bld 101 (*)    BUN 98 (*)    Creatinine, Ser 5.23 (*)    Calcium 8.7 (*)    Albumin 3.3 (*)    AST 14 (*)    GFR calc non Af Amer 8 (*)    GFR calc Af Amer 9 (*)    All other components within normal limits  CBC  WITH DIFFERENTIAL/PLATELET - Abnormal; Notable for the following components:   MCV 102.6 (*)    Neutro Abs 7.9 (*)    Lymphs Abs 0.4 (*)    All other components within normal limits  URINALYSIS, ROUTINE W REFLEX MICROSCOPIC - Abnormal; Notable for the following components:   Hgb urine dipstick LARGE (*)    Protein, ur 100 (*)    Leukocytes,Ua SMALL (*)    RBC / HPF >50 (*)    Bacteria, UA RARE (*)    All other components within normal limits  BRAIN NATRIURETIC PEPTIDE - Abnormal; Notable for the following components:   B Natriuretic Peptide 345.3 (*)    All other components within normal limits  PHOSPHORUS - Abnormal; Notable for the following components:   Phosphorus 6.6 (*)    All other components within normal limits  RESPIRATORY PANEL BY RT PCR (FLU A&B, COVID)  MAGNESIUM    EKG EKG Interpretation  Date/Time:  Friday Nov 08 2019 17:23:55 EDT Ventricular Rate:  59 PR Interval:    QRS Duration: 177 QT Interval:  427 QTC Calculation: 423 R Axis:   90 Text Interpretation: Atrial fibrillation RBBB and LPFB Artifact in lead(s) I II III aVR aVL aVF V1 V2 No old tracing to compare Confirmed by Daleen Bo [phone]) on 11/08/2019 5:44:43 PM   Radiology US Renal  Result Date: 11/08/2019 CLINICAL DATA:  74 year old female with acute renal insufficiency. EXAM: RENAL / URINARY TRACT ULTRASOUND COMPLETE COMPARISON:  None. FINDINGS: Evaluation is very limited due to body habitus and inability of the patient to cooperate with exam. Right Kidney: Renal measurements: 13.4 x 7.3 x 8.5 cm = volume: 437 mL. There is mild increased renal parenchymal  echogenicity. There is moderate hydronephrosis. Inferior pole stone or clusters of adjacent stones measure up to 2.5 cm. Left Kidney: Renal measurements: 12.5 x 6.1 x 7.0 cm = volume: 278 mL. The left kidney is suboptimally visualized. There is mild increased echogenicity. There is moderate left hydronephrosis. No shadowing stone. Bladder: Not visualized. Other: None. IMPRESSION: 1. Increased renal parenchymal echogenicity in keeping with underlying kidney disease. 2. Moderate bilateral hydronephrosis. 3. Right renal calculi. Electronically Signed   By: Anner Crete M.D.   On: 11/08/2019 20:55   DG Chest Port 1 View  Result Date: 11/08/2019 CLINICAL DATA:  Severe anxiety.  Short of breath and weakness EXAM: PORTABLE CHEST 1 VIEW COMPARISON:  12/28/2018 FINDINGS: Moderate enlargement of the cardiopericardial silhouette. No mediastinal or hilar masses. Clear lungs.  No convincing pleural effusion and no pneumothorax. Skeletal structures are grossly intact. IMPRESSION: 1. No acute cardiopulmonary disease. 2. Stable moderate cardiomegaly. Electronically Signed   By: Lajean Manes M.D.   On: 11/08/2019 18:28   CT Renal Stone Study  Result Date: 11/08/2019 CLINICAL DATA:  Flank pain EXAM: CT ABDOMEN AND PELVIS WITHOUT CONTRAST TECHNIQUE: Multidetector CT imaging of the abdomen and pelvis was performed following the standard protocol without IV contrast. COMPARISON:  CT 07/03/2015, ultrasound 11/08/2019 FINDINGS: Lower chest: Moderate right pleural effusion.  Cardiomegaly. Hepatobiliary: Status post cholecystectomy. No focal hepatic abnormality or biliary dilatation. Pancreas: Unremarkable. No pancreatic ductal dilatation or surrounding inflammatory changes. Spleen: Normal in size without focal abnormality. Adrenals/Urinary Tract: 2.6 cm right adrenal adenoma. 2.3 cm left adrenal adenoma. These are unchanged. Moderate to marked right hydronephrosis and moderate left hydronephrosis. Numerous intrarenal stones on  the right. Twelve by 16 mm stone in the left renal pelvis. Sixteen by 15 mm stone in the right renal pelvis. Multiple additional  stones in the lower pole of the right kidney. Considerable right perinephric stranding. No distal ureteral stones. Urinary bladder is unremarkable Stomach/Bowel: The stomach is nonenlarged. No dilated small bowel. No bowel wall thickening. Sigmoid colon diverticula without acute inflammatory change Vascular/Lymphatic: Moderate aortic atherosclerosis without aneurysm. No suspicious adenopathy Reproductive: Uterus unremarkable. Stable ovoid calcification in the left adnexa Other: No free air. Small pelvic fluid. Left paramidline ventral hernia containing mesenteric fat. Generalized subcutaneous edema consistent with anasarca. Musculoskeletal: No acute or suspicious osseous abnormality IMPRESSION: 1. Moderate severe right hydronephrosis and moderate left hydronephrosis. Large stones within the bilateral renal pelvises measuring 16 mm on the right and 16 mm on the left, contributing to bilateral hydronephrosis. No ureteral stones. Multiple additional intrarenal stones on the right 2. Stable bilateral adrenal gland adenomas. 3. Left abdominal ventral hernia containing fat 4. Sigmoid colon diverticular disease without acute inflammatory change 5. Cardiomegaly.  Moderate right pleural effusion Electronically Signed   By: Donavan Foil M.D.   On: 11/08/2019 22:40    Procedures .Critical Care Performed by: Jacqlyn Larsen, PA-C Authorized by: Jacqlyn Larsen, PA-C   Critical care provider statement:    Critical care time (minutes):  45   Critical care was necessary to treat or prevent imminent or life-threatening deterioration of the following conditions:  Renal failure   Critical care was time spent personally by me on the following activities:  Discussions with consultants, evaluation of patient's response to treatment, examination of patient, ordering and performing treatments and  interventions, ordering and review of laboratory studies, ordering and review of radiographic studies, pulse oximetry, re-evaluation of patient's condition, obtaining history from patient or surrogate and review of old charts   (including critical care time)  Medications Ordered in ED Medications  sodium zirconium cyclosilicate (LOKELMA) packet 10 g (10 g Oral Given 11/08/19 1956)    ED Course  I have reviewed the triage vital signs and the nursing notes.  Pertinent labs & imaging results that were available during my care of the patient were reviewed by me and considered in my medical decision making (see chart for details).    MDM Rules/Calculators/A&P                      74 year old female sent from PCPs office for acutely worsening renal failure, with unclear cause.  Patient does not appear dehydrated, and if anything appears a bit fluid overloaded.  She has not had any new medication changes that would likely contribute to worsening kidney function, and only takes a low dose of diuretic as needed.  She states she has been eating and drinking well.  Does have a history of kidney stones but denies flank pain or aminal pain.  Will get basic labs, chest x-ray and renal ultrasound to assess for potential causes of acute renal failure.  I have independently ordered, reviewed and interpreted all labs and imaging: Labs confirm creatinine of 5.23 with a GFR of 8.  Patient also noted to have hyperkalemia of 5.8, and mild hypocalcemia.  Phosphorus is elevated as well.  LFTs are unremarkable.  Patient without leukocytosis and stable hemoglobin.  Urinalysis without significant signs of infection.  Chest x-ray shows stable cardiomegaly without acute findings. Renal ultrasound shows bilateral hydronephrosis with some intrarenal stones present, concern that with bilateral hypertrophic certainly be contributing to patient's acute renal failure, will get CT renal stone study.  CT shows two 21mm kidney  stones each present in bilateral renal pelvises causing hydronephrosis,  worse on the right than left.  Patient also noted to have numerous intrarenal stones.  I suspect this obstructive uropathy is causing patient's symptoms.  Will discuss with urology.  Case discussed with Dr. Louis Meckel with urology who reviewed films, will likely plan to take patient tomorrow morning for stenting, request medicine admission.  Case discussed with Dr. Replied with [facility] hospitalist who will see and admit the patient.  Final Clinical Impression(s) / ED Diagnoses Final diagnoses:  Acute renal failure, unspecified acute renal failure type ([place])  Bilateral renal stones  Bilateral hydronephrosis    Rx / DC Orders ED Discharge Orders    None       Jacqlyn Larsen, [place] 11/09/19 Irven Coe    Daleen Bo, MD 11/09/19 1515

## 2019-11-08 NOTE — Consult Note (Signed)
Acute renal failure w/ oliguria.  CT shows bilateral UPJ stones with proximal hydronephrosis.  No evidence of infection.  Pain controlled.  Plan to place stents bilaterally in the AM.  Please make the patient NPO p MN.

## 2019-11-08 NOTE — ED Notes (Signed)
While attempting to get pt into bed. Pt is noted to have severe anxiety. Pt is very tearful and anxious. Pt unable to tolerate the gown to be tied, pt pulling gown off so gown will not be touching her throat. . Pt wants the door left open due to claustrophobia, pt unable to lay flat. Pts brother at bedside.

## 2019-11-08 NOTE — H&P (Signed)
History and Physical    Gloria [name] [id number] DOB: [birth year] DOA: 11/08/2019  PCP: Maryella Shivers, MD Patient coming from: Home  Chief Complaint: Abnormal labs  HPI: Gloria [name] is a 74 y.o. female with medical history significant of atrial fibrillation, hypertension being sent to the ED by his PCP for evaluation of acute renal failure.  No history could be obtained from the patient as she is very hard of hearing and is able to communicate only by reading her brothers lips who is at bedside.  Brother states that patient was seen by her PCP 3 days ago for follow-up for high blood pressure and lab work was done which revealed a creatinine of 7.  Repeat labs were done today which came back showing creatinine of 5.  Patient had a mammogram 2 weeks ago and since then has been complaining of mild intermittent right groin pain.  She thought this was related to her twisting on the table to get her mammogram done.  She was subsequently seen at an urgent care center and prescribed prednisone for her groin pain.  She has not complained of any flank pain.  No fevers, chills, cough, or chest pain.  Brother states patient had some shortness of breath a few days ago while taking prednisone but it has now resolved.  He is not sure how much urine output the patient is having as she wears pads for incontinence.  States her legs are chronically swollen and there is no recent change in the swelling.  Patient lives by herself and her brother lives next door to her.  ED Course: No fever. Labs showing no leukocytosis.  BUN 98, creatinine 5.2. GFR 8. Potassium 5.6. Bicarb 21, anion gap 11. BNP 345.  Phosphorus 6.6. UA with evidence of microscopic hematuria but no significant pyuria or bacteriuria to suggest infection. SARS-CoV-2 PCR test pending. Chest x-ray showing stable moderate cardiomegaly and no acute cardiopulmonary disease. Renal ultrasound showing increased renal parenchymal echogenicity, right  renal calculi, and moderate bilateral hydronephrosis. CT renal stone study showing moderate to severe right hydronephrosis and moderate left hydronephrosis. Large stones within the bilateral renal pelvises measuring 16 mm in the right and 16 mm on the left, contributing to bilateral hydronephrosis. No ureteral stones. Multiple additional intrarenal stones on the right.  CT renal stone study also showing cardiomegaly and moderate right pleural effusion.  Patient was given Lokelma.  Urology was consulted.   Review of Systems:  All systems reviewed and apart from history of presenting illness, are negative.  Past Medical History:  Diagnosis Date  . Atrial fibrillation ([place])   . Claustrophobia   . HOH (hard of hearing)     History reviewed. No pertinent surgical history.   has no history on file for tobacco, alcohol, and drug.  Allergies  Allergen Reactions  . Aspartame And Phenylalanine   . Azithromycin Other (See Comments)    Unknown  . Bee Venom Other (See Comments)    Hyper  . Cortizone-5 [Hydrocortisone]   . Latex   . Olmesartan Other (See Comments)    unknown  . Other Other (See Comments)    IV dye Dyes - unknown reaction   . Penicillins   . Valsartan Other (See Comments)    Unknown    History reviewed. No pertinent family history.  Prior to Admission medications   Medication Sig Start Date End Date Taking? Authorizing Provider  aspirin 325 MG tablet Take 325 mg by mouth daily.   Yes [provider]  carvedilol (COREG) 6.25 MG tablet Take 6.25 mg by mouth 2 (two) times daily. 11/08/19  Yes [provider]  furosemide (LASIX) 20 MG tablet Take 20 mg by mouth as needed for fluid.   Yes [provider]  meclizine (ANTIVERT) 25 MG tablet Take 25 mg by mouth as needed for dizziness. 08/17/13  Yes [provider]    Physical Exam: Vitals:   11/08/19 2000 11/08/19 2100 11/08/19 2200 11/08/19 2300  BP: 136/67 (!) 142/78 (!) 150/70 (!)  151/82  Pulse: (!) 58 66 62 (!) 50  Resp: 20 20 20 16   Temp:      TempSrc:      SpO2: 97% 98% 97% 92%  Weight:      Height:        Physical Exam  Constitutional: She is oriented to person, place, and time. She appears well-developed and well-nourished. No distress.  HENT:  Head: Normocephalic.  Eyes: Right eye exhibits no discharge. Left eye exhibits no discharge.  Cardiovascular: Normal rate, regular rhythm and intact distal pulses.  Pulmonary/Chest: No respiratory distress. She has no wheezes. She has no rales.  Respiratory rate in the low 20s Decreased breath sounds at the right lung base  Abdominal: Soft. Bowel sounds are normal. She exhibits no distension. There is no abdominal tenderness. There is no guarding.  Musculoskeletal:        General: Edema present.     Cervical back: Neck supple.     Comments: +4 pitting edema of bilateral lower extremities  Neurological: She is alert and oriented to person, place, and time.  Skin: Skin is warm and dry. She is not diaphoretic.    Labs on Admission: I have personally reviewed following labs and imaging studies  CBC: Recent Labs  Lab 11/08/19 1722  WBC 9.0  NEUTROABS 7.9*  HGB 12.4  HCT 40.0  MCV 102.6*  PLT 123456   Basic Metabolic Panel: Recent Labs  Lab 11/08/19 1722  NA 141  K 5.6*  CL 109  CO2 21*  GLUCOSE 101*  BUN 98*  CREATININE 5.23*  CALCIUM 8.7*  MG 2.1  PHOS 6.6*   GFR: Estimated Creatinine Clearance: 10.8 mL/min (A) (by C-G formula based on SCr of 5.23 mg/dL (H)). Liver Function Tests: Recent Labs  Lab 11/08/19 1722  AST 14*  ALT 14  ALKPHOS 66  BILITOT 0.6  PROT 6.9  ALBUMIN 3.3*   No results for input(s): LIPASE, AMYLASE in the last 168 hours. No results for input(s): AMMONIA in the last 168 hours. Coagulation Profile: No results for input(s): INR, PROTIME in the last 168 hours. Cardiac Enzymes: No results for input(s): CKTOTAL, CKMB, CKMBINDEX, TROPONINI in the last 168 hours. BNP  (last 3 results) No results for input(s): PROBNP in the last 8760 hours. HbA1C: No results for input(s): HGBA1C in the last 72 hours. CBG: No results for input(s): GLUCAP in the last 168 hours. Lipid Profile: No results for input(s): CHOL, HDL, LDLCALC, TRIG, CHOLHDL, LDLDIRECT in the last 72 hours. Thyroid Function Tests: No results for input(s): TSH, T4TOTAL, FREET4, T3FREE, THYROIDAB in the last 72 hours. Anemia Panel: No results for input(s): VITAMINB12, FOLATE, FERRITIN, TIBC, IRON, RETICCTPCT in the last 72 hours. Urine analysis:    Component Value Date/Time   COLORURINE YELLOW 11/08/2019 2001   APPEARANCEUR CLEAR 11/08/2019 2001   LABSPEC 1.012 11/08/2019 2001   PHURINE 5.0 11/08/2019 2001   GLUCOSEU NEGATIVE 11/08/2019 2001   [place] (A) 11/08/2019 2001  BILIRUBINUR NEGATIVE 11/08/2019 2001   [place] 11/08/2019 2001   PROTEINUR 100 (A) 11/08/2019 2001   NITRITE NEGATIVE 11/08/2019 2001   LEUKOCYTESUR SMALL (A) 11/08/2019 2001    Radiological Exams on Admission: US Renal  Result Date: 11/08/2019 CLINICAL DATA:  74 year old female with acute renal insufficiency. EXAM: RENAL / URINARY TRACT ULTRASOUND COMPLETE COMPARISON:  None. FINDINGS: Evaluation is very limited due to body habitus and inability of the patient to cooperate with exam. Right Kidney: Renal measurements: 13.4 x 7.3 x 8.5 cm = volume: 437 mL. There is mild increased renal parenchymal echogenicity. There is moderate hydronephrosis. Inferior pole stone or clusters of adjacent stones measure up to 2.5 cm. Left Kidney: Renal measurements: 12.5 x 6.1 x 7.0 cm = volume: 278 mL. The left kidney is suboptimally visualized. There is mild increased echogenicity. There is moderate left hydronephrosis. No shadowing stone. Bladder: Not visualized. Other: None. IMPRESSION: 1. Increased renal parenchymal echogenicity in keeping with underlying kidney disease. 2. Moderate bilateral hydronephrosis. 3. Right renal  calculi. Electronically Signed   By: Anner Crete M.D.   On: 11/08/2019 20:55   DG Chest Port 1 View  Result Date: 11/08/2019 CLINICAL DATA:  Severe anxiety.  Short of breath and weakness EXAM: PORTABLE CHEST 1 VIEW COMPARISON:  12/28/2018 FINDINGS: Moderate enlargement of the cardiopericardial silhouette. No mediastinal or hilar masses. Clear lungs.  No convincing pleural effusion and no pneumothorax. Skeletal structures are grossly intact. IMPRESSION: 1. No acute cardiopulmonary disease. 2. Stable moderate cardiomegaly. Electronically Signed   By: Lajean Manes M.D.   On: 11/08/2019 18:28   CT Renal Stone Study  Result Date: 11/08/2019 CLINICAL DATA:  Flank pain EXAM: CT ABDOMEN AND PELVIS WITHOUT CONTRAST TECHNIQUE: Multidetector CT imaging of the abdomen and pelvis was performed following the standard protocol without IV contrast. COMPARISON:  CT 07/03/2015, ultrasound 11/08/2019 FINDINGS: Lower chest: Moderate right pleural effusion.  Cardiomegaly. Hepatobiliary: Status post cholecystectomy. No focal hepatic abnormality or biliary dilatation. Pancreas: Unremarkable. No pancreatic ductal dilatation or surrounding inflammatory changes. Spleen: Normal in size without focal abnormality. Adrenals/Urinary Tract: 2.6 cm right adrenal adenoma. 2.3 cm left adrenal adenoma. These are unchanged. Moderate to marked right hydronephrosis and moderate left hydronephrosis. Numerous intrarenal stones on the right. Twelve by 16 mm stone in the left renal pelvis. Sixteen by 15 mm stone in the right renal pelvis. Multiple additional stones in the lower pole of the right kidney. Considerable right perinephric stranding. No distal ureteral stones. Urinary bladder is unremarkable Stomach/Bowel: The stomach is nonenlarged. No dilated small bowel. No bowel wall thickening. Sigmoid colon diverticula without acute inflammatory change Vascular/Lymphatic: Moderate aortic atherosclerosis without aneurysm. No suspicious adenopathy  Reproductive: Uterus unremarkable. Stable ovoid calcification in the left adnexa Other: No free air. Small pelvic fluid. Left paramidline ventral hernia containing mesenteric fat. Generalized subcutaneous edema consistent with anasarca. Musculoskeletal: No acute or suspicious osseous abnormality IMPRESSION: 1. Moderate severe right hydronephrosis and moderate left hydronephrosis. Large stones within the bilateral renal pelvises measuring 16 mm on the right and 16 mm on the left, contributing to bilateral hydronephrosis. No ureteral stones. Multiple additional intrarenal stones on the right 2. Stable bilateral adrenal gland adenomas. 3. Left abdominal ventral hernia containing fat 4. Sigmoid colon diverticular disease without acute inflammatory change 5. Cardiomegaly.  Moderate right pleural effusion Electronically Signed   By: Donavan Foil M.D.   On: 11/08/2019 22:40    EKG: Independently reviewed. Rate controlled A. fib, RBBB. No prior EKG for comparison.  Assessment/Plan Principal  Problem:   Acute renal failure (ARF) (HCC) Active Problems:   Obstructive uropathy   Hyperkalemia   Metabolic acidosis   Hyperphosphatemia   Acute renal failure secondary to obstructive uropathy:  BUN 98, creatinine 5.2. GFR 8. No recent labs for comparison. CT showing large stones within the bilateral renal pelvises contributing to bilateral hydronephrosis (moderate to severe in the right and moderate on the left). Multiple additional intrarenal stones on the right. Urology planning on placing bilateral stents in the morning. Keep n.p.o. after midnight. Avoid nephrotoxic agents/contrast.  Obstructive uropathy: CT showing large stones within the bilateral renal pelvises contributing to bilateral hydronephrosis (moderate to severe in the right and moderate on the left). Multiple additional intrarenal stones on the right. No signs of infection. Urology planning on placing bilateral stents in the morning. Keep n.p.o. after  midnight.  Mild intermittent right groin pain: Musculoskeletal versus possible renal colic.  No acute osseous abnormality identified on CT.  Order Tylenol as needed for pain.  Mild hyperkalemia: Likely due to AKI. Potassium 5.6. Patient has been given Lokelma. Repeat BMP. Continue cardiac monitoring.  Mild normal anion gap metabolic acidosis: Likely due to AKI. Bicarb 21, anion gap 11. Management of AKI as mentioned above. Continue to monitor.  Hyperphosphatemia: Likely due to AKI. Phosphorus 6.6. Management of AKI as mentioned above. Continue to monitor.  Volume overload: Possibly related to acute renal failure versus CHF. No documented history of CHF. BNP elevated at 345.  Has significant peripheral edema on exam which her brother states is chronic.  Chest x-ray showing stable moderate cardiomegaly and no acute cardiopulmonary disease.  CT renal stone study with evidence of moderate right pleural effusion.  Patient is slightly tachypneic but not hypoxic.  Order CT chest without contrast for further elevation, may need thoracentesis.  Also order echocardiogram. Unable to give a diuretic in the setting of acute renal failure.    Atrial fibrillation: Currently rate controlled. Last cardiology note is from August 2017 under care everywhere. She is currently on aspirin 325 mg daily. Not on anticoagulation due to high risk of falls. Continue home aspirin and Coreg.   Hypertension: Blood pressure mildly elevated with systolic in the 0000000 to [id number]. Continue home Coreg. Hydralazine as needed for SBP >170.   DVT prophylaxis: SCDs Code Status: Full code.  Discussed with patient's brother at bedside. Family Communication: Brother updated. Disposition Plan: Status is: Inpatient  Remains inpatient appropriate because:Ongoing diagnostic testing needed not appropriate for outpatient work up and Inpatient level of care appropriate due to severity of illness   Dispo: The patient is from: Home               Anticipated d/c is to: Home              Anticipated d/c date is: 3 days              Patient currently is not medically stable to d/c.  The medical decision making on this patient was of high complexity and the patient is at high risk for clinical deterioration, therefore this is a level 3 visit.  Shela Leff MD [facility]  If 7PM-7AM, please contact night-coverage [email or web address]  11/08/2019, 11:54 PM

## 2019-11-08 NOTE — ED Provider Notes (Signed)
  Face-to-face evaluation   History: Patient sent here by PCP after screening labs revealed creatinine to be elevated.  She last had screening labs in July 2021 apparently her GFR was around 40.  Her only recent illness is leg pain, treated with prednisone for 4 days.  Physical exam: Alert obese elderly female.  She is alert and cooperative and answers questions, when she can read lips.  No respiratory distress.  She is lucid.  Medical screening examination/treatment/procedure(s) were conducted as a shared visit with non-physician practitioner(s) and myself.  I personally evaluated the patient during the encounter    Daleen Bo, MD 11/09/19 1515

## 2019-11-08 NOTE — ED Triage Notes (Signed)
Patient went to PCP for basic labs, found creatinine at 7, redrew the lab and creatinine was still at 7. Patient has no complaints. Brother is primary source of information, patient is HOH.

## 2019-11-09 ENCOUNTER — Inpatient Hospital Stay (HOSPITAL_COMMUNITY): Payer: PPO

## 2019-11-09 ENCOUNTER — Encounter (HOSPITAL_COMMUNITY): Payer: Self-pay | Admitting: Internal Medicine

## 2019-11-09 ENCOUNTER — Encounter (HOSPITAL_COMMUNITY)
Admission: EM | Disposition: A | Discharge status: Home Health | Payer: Self-pay | Source: Ambulatory Visit | Attending: Internal Medicine

## 2019-11-09 ENCOUNTER — Inpatient Hospital Stay (HOSPITAL_COMMUNITY): Payer: PPO | Admitting: Certified Registered Nurse Anesthetist

## 2019-11-09 DIAGNOSIS — N189 Chronic kidney disease, unspecified: Secondary | ICD-10-CM | POA: Diagnosis not present

## 2019-11-09 DIAGNOSIS — N179 Acute kidney failure, unspecified: Secondary | ICD-10-CM | POA: Diagnosis not present

## 2019-11-09 DIAGNOSIS — E872 Acidosis: Secondary | ICD-10-CM

## 2019-11-09 DIAGNOSIS — N201 Calculus of ureter: Secondary | ICD-10-CM | POA: Diagnosis not present

## 2019-11-09 DIAGNOSIS — I5033 Acute on chronic diastolic (congestive) heart failure: Secondary | ICD-10-CM

## 2019-11-09 DIAGNOSIS — E875 Hyperkalemia: Secondary | ICD-10-CM

## 2019-11-09 DIAGNOSIS — I4891 Unspecified atrial fibrillation: Secondary | ICD-10-CM | POA: Diagnosis not present

## 2019-11-09 DIAGNOSIS — N139 Obstructive and reflux uropathy, unspecified: Secondary | ICD-10-CM

## 2019-11-09 HISTORY — PX: CYSTOSCOPY WITH STENT PLACEMENT: SHX5790

## 2019-11-09 LAB — RENAL FUNCTION PANEL
Albumin: 3 g/dL — ABNORMAL LOW (ref 3.5–5.0)
Anion gap: 11 (ref 5–15)
BUN: 97 mg/dL — ABNORMAL HIGH (ref 8–23)
CO2: 19 mmol/L — ABNORMAL LOW (ref 22–32)
Calcium: 8.3 mg/dL — ABNORMAL LOW (ref 8.9–10.3)
Chloride: 109 mmol/L (ref 98–111)
Creatinine, Ser: 5.24 mg/dL — ABNORMAL HIGH (ref 0.44–1.00)
GFR calc Af Amer: 9 mL/min — ABNORMAL LOW (ref >60)
GFR calc non Af Amer: 7 mL/min — ABNORMAL LOW (ref >60)
Glucose, Bld: 84 mg/dL (ref 70–99)
Phosphorus: 6.6 mg/dL — ABNORMAL HIGH (ref 2.5–4.6)
Potassium: 5.3 mmol/L — ABNORMAL HIGH (ref 3.5–5.1)
Sodium: 139 mmol/L (ref 135–145)

## 2019-11-09 LAB — RESPIRATORY PANEL BY RT PCR (FLU A&B, COVID)
Influenza A by PCR: NEGATIVE
Influenza B by PCR: NEGATIVE
SARS Coronavirus 2 by RT PCR: NEGATIVE

## 2019-11-09 LAB — ECHOCARDIOGRAM COMPLETE
Height: 63 in
Weight: 3485.03 oz

## 2019-11-09 LAB — POTASSIUM: Potassium: 5.4 mmol/L — ABNORMAL HIGH (ref 3.5–5.1)

## 2019-11-09 SURGERY — CYSTOSCOPY, WITH STENT INSERTION
Anesthesia: General | Site: Ureter | Laterality: Bilateral

## 2019-11-09 MED ORDER — CIPROFLOXACIN IN D5W 400 MG/200ML IV SOLN
INTRAVENOUS | Status: AC
  Filled 2019-11-09: qty 200

## 2019-11-09 MED ORDER — PROPOFOL 10 MG/ML IV BOLUS
INTRAVENOUS | Status: AC
  Filled 2019-11-09: qty 20

## 2019-11-09 MED ORDER — STERILE WATER FOR IRRIGATION IR SOLN
Status: DC | PRN
  Administered 2019-11-09: 10 mL

## 2019-11-09 MED ORDER — ASPIRIN 325 MG PO TABS
325.0000 mg | ORAL_TABLET | Freq: Every day | ORAL | Status: DC
  Administered 2019-11-10 – 2019-11-12 (×3): 325 mg via ORAL
  Filled 2019-11-09 (×3): qty 1

## 2019-11-09 MED ORDER — ONDANSETRON HCL 4 MG/2ML IJ SOLN
INTRAMUSCULAR | Status: AC
  Filled 2019-11-09: qty 2

## 2019-11-09 MED ORDER — SODIUM CHLORIDE 0.9 % IR SOLN
Status: DC | PRN
  Administered 2019-11-09: 3000 mL

## 2019-11-09 MED ORDER — CHLORHEXIDINE GLUCONATE CLOTH 2 % EX PADS
6.0000 | MEDICATED_PAD | Freq: Every day | CUTANEOUS | Status: DC
  Administered 2019-11-09 – 2019-11-10 (×2): 6 via TOPICAL

## 2019-11-09 MED ORDER — SODIUM CHLORIDE 0.9% FLUSH
3.0000 mL | Freq: Two times a day (BID) | INTRAVENOUS | Status: DC
  Administered 2019-11-09 – 2019-11-12 (×7): 3 mL via INTRAVENOUS

## 2019-11-09 MED ORDER — DEXAMETHASONE SODIUM PHOSPHATE 10 MG/ML IJ SOLN
INTRAMUSCULAR | Status: DC | PRN
  Administered 2019-11-09: 10 mg via INTRAVENOUS

## 2019-11-09 MED ORDER — PROMETHAZINE HCL 25 MG/ML IJ SOLN: 6.2500 mg | INTRAMUSCULAR | Status: DC | PRN

## 2019-11-09 MED ORDER — FENTANYL CITRATE (PF) 100 MCG/2ML IJ SOLN
INTRAMUSCULAR | Status: DC | PRN
  Administered 2019-11-09 (×2): 25 ug via INTRAVENOUS
  Administered 2019-11-09: 50 ug via INTRAVENOUS

## 2019-11-09 MED ORDER — PROPOFOL 500 MG/50ML IV EMUL
INTRAVENOUS | Status: DC | PRN
  Administered 2019-11-09: 120 mg via INTRAVENOUS

## 2019-11-09 MED ORDER — ACETAMINOPHEN 325 MG PO TABS: 650.0000 mg | ORAL_TABLET | ORAL | Status: DC | PRN

## 2019-11-09 MED ORDER — IOHEXOL 300 MG/ML  SOLN
INTRAMUSCULAR | Status: DC | PRN
  Administered 2019-11-09: 15 mL via URETHRAL

## 2019-11-09 MED ORDER — EPHEDRINE 5 MG/ML INJ
INTRAVENOUS | Status: AC
  Filled 2019-11-09: qty 10

## 2019-11-09 MED ORDER — LACTATED RINGERS IV SOLN: INTRAVENOUS | Status: DC | PRN

## 2019-11-09 MED ORDER — SODIUM CHLORIDE 0.9 % IV SOLN: 250.0000 mL | INTRAVENOUS | Status: DC | PRN

## 2019-11-09 MED ORDER — CIPROFLOXACIN IN D5W 400 MG/200ML IV SOLN
400.0000 mg | Freq: Once | INTRAVENOUS | Status: AC
  Administered 2019-11-09: 400 mg via INTRAVENOUS

## 2019-11-09 MED ORDER — FENTANYL CITRATE (PF) 100 MCG/2ML IJ SOLN
INTRAMUSCULAR | Status: AC
  Filled 2019-11-09: qty 2

## 2019-11-09 MED ORDER — CARVEDILOL 6.25 MG PO TABS
6.2500 mg | ORAL_TABLET | Freq: Two times a day (BID) | ORAL | Status: DC
  Administered 2019-11-09 – 2019-11-12 (×8): 6.25 mg via ORAL
  Filled 2019-11-09 (×7): qty 1
  Filled 2019-11-09: qty 2

## 2019-11-09 MED ORDER — DEXAMETHASONE SODIUM PHOSPHATE 10 MG/ML IJ SOLN
INTRAMUSCULAR | Status: AC
  Filled 2019-11-09: qty 1

## 2019-11-09 MED ORDER — LIDOCAINE 2% (20 MG/ML) 5 ML SYRINGE
INTRAMUSCULAR | Status: DC | PRN
  Administered 2019-11-09: 60 mg via INTRAVENOUS

## 2019-11-09 MED ORDER — ONDANSETRON HCL 4 MG/2ML IJ SOLN
INTRAMUSCULAR | Status: DC | PRN
  Administered 2019-11-09: 4 mg via INTRAVENOUS

## 2019-11-09 MED ORDER — LIDOCAINE 2% (20 MG/ML) 5 ML SYRINGE
INTRAMUSCULAR | Status: AC
  Filled 2019-11-09: qty 5

## 2019-11-09 MED ORDER — FENTANYL CITRATE (PF) 100 MCG/2ML IJ SOLN: 25.0000 ug | INTRAMUSCULAR | Status: DC | PRN

## 2019-11-09 MED ORDER — HYDRALAZINE HCL 20 MG/ML IJ SOLN: 5.0000 mg | INTRAMUSCULAR | Status: DC | PRN

## 2019-11-09 MED ORDER — SODIUM CHLORIDE 0.9% FLUSH: 3.0000 mL | INTRAVENOUS | Status: DC | PRN

## 2019-11-09 MED ORDER — FENTANYL CITRATE (PF) 100 MCG/2ML IJ SOLN: 12.5000 ug | INTRAMUSCULAR | Status: DC | PRN

## 2019-11-09 MED ORDER — PHENYLEPHRINE 40 MCG/ML (10ML) SYRINGE FOR IV PUSH (FOR BLOOD PRESSURE SUPPORT)
PREFILLED_SYRINGE | INTRAVENOUS | Status: AC
  Filled 2019-11-09: qty 10

## 2019-11-09 SURGICAL SUPPLY — 18 items
BAG URO CATCHER STRL LF (MISCELLANEOUS) ×3 IMPLANT
CATH URET 5FR 28IN OPEN ENDED (CATHETERS) ×3 IMPLANT
CLOTH BEACON ORANGE TIMEOUT ST (SAFETY) ×3 IMPLANT
GLOVE BIOGEL PI IND STRL 7.0 (GLOVE) ×2 IMPLANT
GLOVE BIOGEL PI IND STRL 7.5 (GLOVE) ×2 IMPLANT
GLOVE BIOGEL PI INDICATOR 7.0 (GLOVE) ×4
GLOVE BIOGEL PI INDICATOR 7.5 (GLOVE) ×4
GOWN STRL REUS W/TWL XL LVL3 (GOWN DISPOSABLE) ×6 IMPLANT
GUIDEWIRE STR DUAL SENSOR (WIRE) ×3 IMPLANT
KIT TURNOVER KIT A (KITS) ×3 IMPLANT
MANIFOLD NEPTUNE II (INSTRUMENTS) ×3 IMPLANT
STENT URET 6FRX24 CONTOUR (STENTS) ×6 IMPLANT
TRAY CYSTO PACK (CUSTOM PROCEDURE TRAY) ×3 IMPLANT
TRAY FOL W/BAG SLVR 16FR STRL (SET/KITS/TRAYS/PACK) ×1 IMPLANT
TRAY FOLEY W/BAG SLVR 16FR LF (SET/KITS/TRAYS/PACK) ×2
TRAY PREP A LATEX SAFE STRL (SET/KITS/TRAYS/PACK) ×3 IMPLANT
TUBING CONNECTING 10 (TUBING) ×2 IMPLANT
TUBING CONNECTING 10' (TUBING) ×1

## 2019-11-09 NOTE — Anesthesia Preprocedure Evaluation (Addendum)
Anesthesia Evaluation  Patient identified by MRN, date of birth, ID band Patient awake    Reviewed: Allergy & Precautions, NPO status , Patient's Chart, lab work & pertinent test results  History of Anesthesia Complications Negative for: history of anesthetic complications  Airway Mallampati: III  TM Distance: >3 FB Neck ROM: Full    Dental no notable dental hx. (+) Dental Advisory Given   Pulmonary neg pulmonary ROS,    Pulmonary exam normal        Cardiovascular + dysrhythmias Atrial Fibrillation  Rhythm:Irregular Rate:Normal     Neuro/Psych negative neurological ROS     GI/Hepatic negative GI ROS, Neg liver ROS,   Endo/Other  negative endocrine ROS  Renal/GU ARFRenal disease     Musculoskeletal negative musculoskeletal ROS (+)   Abdominal   Peds  Hematology negative hematology ROS (+)   Anesthesia Other Findings Day of surgery medications reviewed with the patient.  Reproductive/Obstetrics                            Anesthesia Physical Anesthesia Plan  ASA: III  Anesthesia Plan: General   Post-op Pain Management:    Induction: Intravenous  PONV Risk Score and Plan: 3 and Ondansetron, Dexamethasone and Treatment may vary due to age or medical condition  Airway Management Planned: LMA  Additional Equipment:   Intra-op Plan:   Post-operative Plan: Extubation in OR  Informed Consent: I have reviewed the patients History and Physical, chart, labs and discussed the procedure including the risks, benefits and alternatives for the proposed anesthesia with the patient or authorized representative who has indicated his/her understanding and acceptance.     Dental advisory given  Plan Discussed with: Anesthesiologist, CRNA and Surgeon  Anesthesia Plan Comments:        Anesthesia Quick Evaluation

## 2019-11-09 NOTE — Anesthesia Procedure Notes (Signed)
Procedure Name: LMA Insertion Date/Time: 11/09/2019 10:42 AM Performed by: Gerald Leitz, CRNA Pre-anesthesia Checklist: Patient identified, Patient being monitored, Timeout performed, Emergency Drugs available and Suction available Patient Re-evaluated:Patient Re-evaluated prior to induction Oxygen Delivery Method: Circle system utilized Preoxygenation: Pre-oxygenation with 100% oxygen Induction Type: IV induction Ventilation: Mask ventilation without difficulty LMA: LMA inserted and LMA with gastric port inserted LMA Size: 4.0 Tube type: Oral Number of attempts: 1 Placement Confirmation: positive ETCO2 and breath sounds checked- equal and bilateral Tube secured with: Tape Dental Injury: Teeth and Oropharynx as per pre-operative assessment

## 2019-11-09 NOTE — Progress Notes (Signed)
The patient underwent cystoscopy, bilateral retrograde pyelogram and stent placement.  The patient had significant hydronephrosis proximal to the obstructing stones.  There was a lot of brisk sediment is/purulent urine in the patient's right collecting system.  She will certainly feel better after having the stents placed.  Would recommend keep a close eye on her vital signs that she may develop infection or sepsis following this procedure.  I did send a urine culture.  Once the patient recovers from her acute renal failure and this hospitalization we will get her scheduled for ureteroscopy or stone treatment.

## 2019-11-09 NOTE — Progress Notes (Signed)
Patient returned from surgery. No change from am assessment. Foley is now in place, site clean dry and intact. Will continue to monitor.

## 2019-11-09 NOTE — Transfer of Care (Signed)
Immediate Anesthesia Transfer of Care Note  Patient: Gloria [name]  Procedure(s) Performed: Procedure(s): CYSTOSCOPY BILATERAL URETERAL STENT PLACEMENT (Bilateral)  Patient Location: PACU  Anesthesia Type:General  Level of Consciousness: Alert, Awake, Oriented  Airway & Oxygen Therapy: Patient Spontanous Breathing  Post-op Assessment: Report given to RN  Post vital signs: Reviewed and stable  Last Vitals:  Vitals:   11/09/19 0643 11/09/19 0912  BP: (!) 162/92 (!) 149/81  Pulse: 65 (!) 55  Resp: 17 18  Temp: 36.6 C 36.7 C  SpO2: [id number]    Complications: No apparent anesthesia complications

## 2019-11-09 NOTE — Progress Notes (Signed)
PROGRESS NOTE  Gloria [name] [id number] DOB: 1946-05-20 DOA: 11/08/2019 PCP: Maryella Shivers, MD   LOS: 1 day   Brief narrative: As per HPI,  Gloria [name] is a 74 y.o. female with past medical history significant of atrial fibrillation, hypertension was sent to the emergency department for abnormal labs.  Patient was a hard of hearing but  reads lips.  Information was obtained from the patient's brother who stated that the patient had gone to her primary care physician for elevated blood pressure and had elevated creatinine of 7.  Repeat labs were done which showed a creatinine of 5.  Patient was also complaining of groin pain shortness of breath.  History of chronic leg swelling.  Patient does live by herself at home.  ED Course:  Patient did not have any fever.  Labs showed no leukocytosis.  BUN 98, creatinine 5.2. GFR 8. Potassium 5.6. Bicarb 21, anion gap 11. BNP 345.  Phosphorus 6.6. UA with evidence of microscopic hematuria but no significant pyuria or bacteriuria to suggest infection. SARS-CoV-2 PCR test pending. Chest x-ray showing stable moderate cardiomegaly and no acute cardiopulmonary disease. Renal ultrasound showing increased renal parenchymal echogenicity, right renal calculi, and moderate bilateral hydronephrosis. CT renal stone study showing moderate to severe right hydronephrosis and moderate left hydronephrosis. Large stones within the bilateral renal pelvises measuring 16 mm in the right and 16 mm on the left, contributing to bilateral hydronephrosis. No ureteral stones. Multiple additional intrarenal stones on the right.  CT renal stone study also showing cardiomegaly and moderate right pleural effusion.  Assessment/Plan:  Principal Problem:   Acute renal failure (ARF) (HCC) Active Problems:   Obstructive uropathy   Hyperkalemia   Metabolic acidosis   Hyperphosphatemia  Acute kidney injury secondary to obstructive uropathy:  BUN 98, creatinine 5.2. GFR 8.   CT showing large stones within the bilateral renal pelvises contributing to bilateral hydronephrosis (moderate to severe in the right and moderate on the left). Multiple additional intrarenal stones on the right.  Urology on board and planning for bilateral stents today.  Keep n.p.o. for now.    Obstructive uropathy: CT showing large stones within the bilateral renal pelvises contributing to bilateral hydronephrosis (moderate to severe in the right and moderate on the left). Multiple additional intrarenal stones on the right. No signs of infection. Urology planning on placing bilateral stents in the morning. Keep n.p.o. after midnight.  Mild intermittent right groin pain: Musculoskeletal versus possible renal colic.    CT scan with renal stones..  Mild hyperkalemia: Received Lokelma.  Slightly improved, potassium level today potassium of 5.3.  Check BMP in a.m.  Mild normal anion gap metabolic acidosis: Anion gap of 11.  Hyperphosphatemia: Likely due to AKI. Phosphorus 6.6. Management of AKI as mentioned above. Continue to monitor.  Volume overload: Likely secondary to acute renal failure.  No prior history of congestive heart failure.  BNP elevated at 345.  Peripheral edema on exam.  Chest x-ray with moderate cardiomegaly.  CT scan with moderate pleural effusion.  Check 2D echocardiogram.  Hold off with diuretics at this time.   Atrial fibrillation: on aspirin 325 mg daily. Not on anticoagulation due to high risk of falls. Continue home aspirin and Coreg.  Rate controlled at this time.   Hypertension:  Continue Coreg, hydralazine as needed for SBP >170.   VTE Prophylaxis: Sequential compression device  Code Status: Full code  Family Communication: Spoke with the patient's brother at bedside.  Status is: Inpatient  Remains  inpatient appropriate because:Ongoing diagnostic testing needed not appropriate for outpatient work up, Unsafe d/c plan and Inpatient level of care appropriate due  to severity of illness, plan for urological intervention  Dispo: The patient is from: Home              Anticipated d/c is to: Home              Anticipated d/c date is: 2 days              Patient currently is not medically stable to d/c.  Consultants:  Urology  Procedures:  None yet  Antibiotics:  . None  Anti-infectives (From admission, onward)   None     Subjective: Today, patient was seen and examined at bedside.  Very hard of hearing.  Patient anxious at bedside.  Denies any abdominal pain or urinary urgency but has knee pain and body pain  Objective: Vitals:   11/09/19 0241 11/09/19 0643  BP: 124/85 (!) 162/92  Pulse: 61 65  Resp: 16 17  Temp: 97.7 F (36.5 C) 97.8 F (36.6 C)  SpO2: 93% 95%   No intake or output data in the 24 hours ending 11/09/19 0744 Filed Weights   11/08/19 1617 11/09/19 0500  Weight: 102.1 kg 98.8 kg   Body mass index is 38.58 kg/m.   Physical Exam: GENERAL: Patient is alert awake and oriented. Not in obvious distress.  Morbidly obese, anxious hard of hearing HENT: No scleral pallor or icterus. Pupils equally reactive to light. Oral mucosa is moist NECK: is supple, no gross swelling noted. CHEST:   Diminished breath sounds bilaterally. CVS: S1 and S2 heard, no murmur.  Irregular rhythm. ABDOMEN: Soft, non-tender, bowel sounds are present.  External Foley catheter in place EXTREMITIES: Redness of the bilateral lower extremities, knee tenderness on palpation. CNS: Cranial nerves are intact. No focal motor deficits. SKIN: warm and dry without rashes.  Data Review: I have personally reviewed the following laboratory data and studies,  CBC: Recent Labs  Lab 11/08/19 1722  WBC 9.0  NEUTROABS 7.9*  HGB 12.4  HCT 40.0  MCV 102.6*  PLT [id number]   Basic Metabolic Panel: Recent Labs  Lab 11/08/19 1722 11/09/19 0000 11/09/19 0431  NA 141  --  139  K 5.6* 5.4* 5.3*  CL 109  --  109  CO2 21*  --  19*  GLUCOSE 101*  --  84  BUN  98*  --  97*  CREATININE 5.23*  --  5.24*  CALCIUM 8.7*  --  8.3*  MG 2.1  --   --   PHOS 6.6*  --  6.6*   Liver Function Tests: Recent Labs  Lab 11/08/19 1722 11/09/19 0431  AST 14*  --   ALT 14  --   ALKPHOS 66  --   BILITOT 0.6  --   PROT 6.9  --   ALBUMIN 3.3* 3.0*   No results for input(s): LIPASE, AMYLASE in the last 168 hours. No results for input(s): AMMONIA in the last 168 hours. Cardiac Enzymes: No results for input(s): CKTOTAL, CKMB, CKMBINDEX, TROPONINI in the last 168 hours. BNP (last 3 results) Recent Labs    11/08/19 1722  BNP 345.3*    ProBNP (last 3 results) No results for input(s): PROBNP in the last 8760 hours.  CBG: No results for input(s): GLUCAP in the last 168 hours. Recent Results (from the past 240 hour(s))  Respiratory Panel by RT PCR (Flu A&B, Covid) - Nasopharyngeal Swab  Status: None   Collection Time: 11/08/19 10:48 PM   Specimen: Nasopharyngeal Swab  Result Value Ref Range Status   SARS Coronavirus 2 by RT PCR NEGATIVE NEGATIVE Final    Comment: (NOTE) SARS-CoV-2 target nucleic acids are NOT DETECTED. The SARS-CoV-2 RNA is generally detectable in upper respiratoy specimens during the acute phase of infection. The lowest concentration of SARS-CoV-2 viral copies this assay can detect is 131 copies/mL. A negative result does not preclude SARS-Cov-2 infection and should not be used as the sole basis for treatment or other patient management decisions. A negative result may occur with  improper specimen collection/handling, submission of specimen other than nasopharyngeal swab, presence of viral mutation(s) within the areas targeted by this assay, and inadequate number of viral copies (<131 copies/mL). A negative result must be combined with clinical observations, patient history, and epidemiological information. The expected result is Negative. Fact Sheet for Patients:  PinkCheek.be Fact Sheet for  Healthcare Providers:  GravelBags.it This test is not yet ap proved or cleared by the [place] FDA and  has been authorized for detection and/or diagnosis of SARS-CoV-2 by FDA under an Emergency Use Authorization (EUA). This EUA will remain  in effect (meaning this test can be used) for the duration of the COVID-19 declaration under Section 564(b)(1) of the Act, 21 U.S.C. section 360bbb-3(b)(1), unless the authorization is terminated or revoked sooner.    Influenza A by PCR NEGATIVE NEGATIVE Final   Influenza B by PCR NEGATIVE NEGATIVE Final    Comment: (NOTE) The Xpert Xpress SARS-CoV-2/FLU/RSV assay is intended as an aid in  the diagnosis of influenza from Nasopharyngeal swab specimens and  should not be used as a sole basis for treatment. Nasal washings and  aspirates are unacceptable for Xpert Xpress SARS-CoV-2/FLU/RSV  testing. Fact Sheet for Patients: PinkCheek.be Fact Sheet for Healthcare Providers: GravelBags.it This test is not yet approved or cleared by the [place] FDA and  has been authorized for detection and/or diagnosis of SARS-CoV-2 by  FDA under an Emergency Use Authorization (EUA). This EUA will remain  in effect (meaning this test can be used) for the duration of the  Covid-19 declaration under Section 564(b)(1) of the Act, 21  U.S.C. section 360bbb-3(b)(1), unless the authorization is  terminated or revoked. Performed at [facility], [place] [address], [place], [place] [address]      Studies: US Renal  Result Date: 11/08/2019 CLINICAL DATA:  74 year old female with acute renal insufficiency. EXAM: RENAL / URINARY TRACT ULTRASOUND COMPLETE COMPARISON:  None. FINDINGS: Evaluation is very limited due to body habitus and inability of the patient to cooperate with exam. Right Kidney: Renal measurements: 13.4 x 7.3 x 8.5 cm = volume: 437 mL. There is mild  increased renal parenchymal echogenicity. There is moderate hydronephrosis. Inferior pole stone or clusters of adjacent stones measure up to 2.5 cm. Left Kidney: Renal measurements: 12.5 x 6.1 x 7.0 cm = volume: 278 mL. The left kidney is suboptimally visualized. There is mild increased echogenicity. There is moderate left hydronephrosis. No shadowing stone. Bladder: Not visualized. Other: None. IMPRESSION: 1. Increased renal parenchymal echogenicity in keeping with underlying kidney disease. 2. Moderate bilateral hydronephrosis. 3. Right renal calculi. Electronically Signed   By: Anner Crete M.D.   On: 11/08/2019 20:55   DG Chest Port 1 View  Result Date: 11/08/2019 CLINICAL DATA:  Severe anxiety.  Short of breath and weakness EXAM: PORTABLE CHEST 1 VIEW COMPARISON:  12/28/2018 FINDINGS: Moderate enlargement of the cardiopericardial silhouette.  No mediastinal or hilar masses. Clear lungs.  No convincing pleural effusion and no pneumothorax. Skeletal structures are grossly intact. IMPRESSION: 1. No acute cardiopulmonary disease. 2. Stable moderate cardiomegaly. Electronically Signed   By: Lajean Manes M.D.   On: 11/08/2019 18:28   CT Renal Stone Study  Result Date: 11/08/2019 CLINICAL DATA:  Flank pain EXAM: CT ABDOMEN AND PELVIS WITHOUT CONTRAST TECHNIQUE: Multidetector CT imaging of the abdomen and pelvis was performed following the standard protocol without IV contrast. COMPARISON:  CT 07/03/2015, ultrasound 11/08/2019 FINDINGS: Lower chest: Moderate right pleural effusion.  Cardiomegaly. Hepatobiliary: Status post cholecystectomy. No focal hepatic abnormality or biliary dilatation. Pancreas: Unremarkable. No pancreatic ductal dilatation or surrounding inflammatory changes. Spleen: Normal in size without focal abnormality. Adrenals/Urinary Tract: 2.6 cm right adrenal adenoma. 2.3 cm left adrenal adenoma. These are unchanged. Moderate to marked right hydronephrosis and moderate left hydronephrosis.  Numerous intrarenal stones on the right. Twelve by 16 mm stone in the left renal pelvis. Sixteen by 15 mm stone in the right renal pelvis. Multiple additional stones in the lower pole of the right kidney. Considerable right perinephric stranding. No distal ureteral stones. Urinary bladder is unremarkable Stomach/Bowel: The stomach is nonenlarged. No dilated small bowel. No bowel wall thickening. Sigmoid colon diverticula without acute inflammatory change Vascular/Lymphatic: Moderate aortic atherosclerosis without aneurysm. No suspicious adenopathy Reproductive: Uterus unremarkable. Stable ovoid calcification in the left adnexa Other: No free air. Small pelvic fluid. Left paramidline ventral hernia containing mesenteric fat. Generalized subcutaneous edema consistent with anasarca. Musculoskeletal: No acute or suspicious osseous abnormality IMPRESSION: 1. Moderate severe right hydronephrosis and moderate left hydronephrosis. Large stones within the bilateral renal pelvises measuring 16 mm on the right and 16 mm on the left, contributing to bilateral hydronephrosis. No ureteral stones. Multiple additional intrarenal stones on the right 2. Stable bilateral adrenal gland adenomas. 3. Left abdominal ventral hernia containing fat 4. Sigmoid colon diverticular disease without acute inflammatory change 5. Cardiomegaly.  Moderate right pleural effusion Electronically Signed   By: Donavan Foil M.D.   On: 11/08/2019 22:40      Flora Lipps, MD  [facility] 11/09/2019

## 2019-11-09 NOTE — Consult Note (Signed)
I have been asked to see the patient by Dr. Shela Leff, for evaluation and management of bilateral hydronephrosis from UPJ obstructing stones and acute renal failure.  History of present illness: 74 year old female with a history of mild renal insufficiency who has a history of osteoarthritis and associated joint pain who has been following with her PCP over the last several days because of her pain and elevated blood pressure.  She was noted to have at elevated creatinine.  This was subsequently repeated and the creatinine was higher.  The patient was experiencing some right-sided flank and abdominal pain, which she initially attributed to her osteoarthritis and hip pain.  She was sent to the emergency department for further evaluation.  She had a ultrasound demonstrating hydronephrosis.  A CT scan was subsequently performed demonstrating bilateral hydronephrosis with bilateral obstructing stones.  Her creatinine was 5.2.  Given her obstructing stones and worsening renal function urology was consulted.  The patient denies any dysuria, fevers or chills.  Review of systems: A 12 point comprehensive review of systems was obtained and is negative unless otherwise stated in the history of present illness.  Patient Active Problem List   Diagnosis Date Noted  . Acute renal failure (ARF) ([place]) 11/08/2019  . Obstructive uropathy 11/08/2019  . Hyperkalemia 11/08/2019  . Metabolic acidosis [id number]  . Hyperphosphatemia 11/08/2019    No current facility-administered medications on file prior to encounter.   Current Outpatient Medications on File Prior to Encounter  Medication Sig Dispense Refill  . aspirin 325 MG tablet Take 325 mg by mouth daily.    . carvedilol (COREG) 6.25 MG tablet Take 6.25 mg by mouth 2 (two) times daily.    . furosemide (LASIX) 20 MG tablet Take 20 mg by mouth as needed for fluid.    Marland Kitchen meclizine (ANTIVERT) 25 MG tablet Take 25 mg by mouth as needed for dizziness.       Past Medical History:  Diagnosis Date  . Atrial fibrillation ([place])   . Claustrophobia   . HOH (hard of hearing)     History reviewed. No pertinent surgical history.  Social History   Tobacco Use  . Smoking status: Never Smoker  . Smokeless tobacco: Never Used  Substance Use Topics  . Alcohol use: Not on file  . Drug use: Not on file    History reviewed. No pertinent family history.  PE: Vitals:   11/09/19 0241 11/09/19 0500 11/09/19 0643 11/09/19 0912  BP: 124/85  (!) 162/92 (!) 149/81  Pulse: 61  65 (!) 55  Resp: 16  17 18   Temp: 97.7 F (36.5 C)  97.8 F (36.6 C) 98 F (36.7 C)  TempSrc: Oral  Oral Oral  SpO2: 93%  95% 93%  Weight:  98.8 kg    Height:       Patient appears to be in no acute distress  patient is alert and oriented x3 Atraumatic normocephalic head No cervical or supraclavicular lymphadenopathy appreciated No increased work of breathing, no audible wheezes/rhonchi Regular sinus rhythm/rate Abdomen is soft, nontender, nondistended, right-sided CVA and flank tenderness Lower extremities are symmetric without appreciable edema Grossly neurologically intact No identifiable skin lesions  Recent Labs    11/08/19 1722  WBC 9.0  HGB 12.4  HCT 40.0   Recent Labs    11/08/19 1722 11/09/19 0000 11/09/19 0431  NA 141  --  139  K 5.6* 5.4* 5.3*  CL 109  --  109  CO2 21*  --  19*  GLUCOSE  101*  --  84  BUN 98*  --  97*  CREATININE 5.23*  --  5.24*  CALCIUM 8.7*  --  8.3*   No results for input(s): LABPT, INR in the last 72 hours. No results for input(s): LABURIN in the last 72 hours. Results for orders placed or performed during the hospital encounter of 11/08/19  Respiratory Panel by RT PCR (Flu A&B, Covid) - Nasopharyngeal Swab     Status: None   Collection Time: 11/08/19 10:48 PM   Specimen: Nasopharyngeal Swab  Result Value Ref Range Status   SARS Coronavirus 2 by RT PCR NEGATIVE NEGATIVE Final    Comment: (NOTE) SARS-CoV-2  target nucleic acids are NOT DETECTED. The SARS-CoV-2 RNA is generally detectable in upper respiratoy specimens during the acute phase of infection. The lowest concentration of SARS-CoV-2 viral copies this assay can detect is 131 copies/mL. A negative result does not preclude SARS-Cov-2 infection and should not be used as the sole basis for treatment or other patient management decisions. A negative result may occur with  improper specimen collection/handling, submission of specimen other than nasopharyngeal swab, presence of viral mutation(s) within the areas targeted by this assay, and inadequate number of viral copies (<131 copies/mL). A negative result must be combined with clinical observations, patient history, and epidemiological information. The expected result is Negative. Fact Sheet for Patients:  PinkCheek.be Fact Sheet for Healthcare Providers:  GravelBags.it This test is not yet ap proved or cleared by the [place] FDA and  has been authorized for detection and/or diagnosis of SARS-CoV-2 by FDA under an Emergency Use Authorization (EUA). This EUA will remain  in effect (meaning this test can be used) for the duration of the COVID-19 declaration under Section 564(b)(1) of the Act, 21 U.S.C. section 360bbb-3(b)(1), unless the authorization is terminated or revoked sooner.    Influenza A by PCR NEGATIVE NEGATIVE Final   Influenza B by PCR NEGATIVE NEGATIVE Final    Comment: (NOTE) The Xpert Xpress SARS-CoV-2/FLU/RSV assay is intended as an aid in  the diagnosis of influenza from Nasopharyngeal swab specimens and  should not be used as a sole basis for treatment. Nasal washings and  aspirates are unacceptable for Xpert Xpress SARS-CoV-2/FLU/RSV  testing. Fact Sheet for Patients: PinkCheek.be Fact Sheet for Healthcare Providers: GravelBags.it This test is  not yet approved or cleared by the [place] FDA and  has been authorized for detection and/or diagnosis of SARS-CoV-2 by  FDA under an Emergency Use Authorization (EUA). This EUA will remain  in effect (meaning this test can be used) for the duration of the  Covid-19 declaration under Section 564(b)(1) of the Act, 21  U.S.C. section 360bbb-3(b)(1), unless the authorization is  terminated or revoked. Performed at [facility], [place] [address], [place], [place] [address]     Imaging: I have independently reviewed the patient's CT scan demonstrating a large proximal ureteral stones with associated hydronephrosis.  Imp: The patient has acute on chronic renal failure from bilateral ureteral obstruction.  Recommendations: Our plan is to take the patient urgently to the operating room and placed bilateral ureteral stents.  I spoke with her brother as well as the patient collectively about the proposed procedure.  I explained the rationale for it and the risk and associated benefits.  The patient has opted to proceed.    Ardis Hughs

## 2019-11-09 NOTE — Progress Notes (Signed)
  Echocardiogram 2D Echocardiogram has been performed.  Gloria [name] 11/09/2019, 1:26 PM

## 2019-11-09 NOTE — Anesthesia Postprocedure Evaluation (Signed)
Anesthesia Post Note  Patient: Gloria [name]  Procedure(s) Performed: CYSTOSCOPY BILATERAL URETERAL STENT PLACEMENT (Bilateral Ureter)     Patient location during evaluation: PACU Anesthesia Type: General Level of consciousness: sedated Pain management: pain level controlled Vital Signs Assessment: post-procedure vital signs reviewed and stable Respiratory status: spontaneous breathing and respiratory function stable Cardiovascular status: stable Postop Assessment: no apparent nausea or vomiting Anesthetic complications: no    Last Vitals:  Vitals:   11/09/19 1130 11/09/19 1145  BP: 140/77 132/74  Pulse: 66 62  Resp: 13 (!) 21  Temp:    SpO2: 99% 90%    Last Pain:  Vitals:   11/09/19 1145  TempSrc:   PainSc: 0-No pain                 Tyvion Edmondson DANIEL

## 2019-11-09 NOTE — H&P (View-Only) (Signed)
I have been asked to see the patient by Dr. Shela Leff, for evaluation and management of bilateral hydronephrosis from UPJ obstructing stones and acute renal failure.  History of present illness: 74 year old female with a history of mild renal insufficiency who has a history of osteoarthritis and associated joint pain who has been following with her PCP over the last several days because of her pain and elevated blood pressure.  She was noted to have at elevated creatinine.  This was subsequently repeated and the creatinine was higher.  The patient was experiencing some right-sided flank and abdominal pain, which she initially attributed to her osteoarthritis and hip pain.  She was sent to the emergency department for further evaluation.  She had a ultrasound demonstrating hydronephrosis.  A CT scan was subsequently performed demonstrating bilateral hydronephrosis with bilateral obstructing stones.  Her creatinine was 5.2.  Given her obstructing stones and worsening renal function urology was consulted.  The patient denies any dysuria, fevers or chills.  Review of systems: A 12 point comprehensive review of systems was obtained and is negative unless otherwise stated in the history of present illness.  Patient Active Problem List   Diagnosis Date Noted  . Acute renal failure (ARF) ([place]) 11/08/2019  . Obstructive uropathy 11/08/2019  . Hyperkalemia 11/08/2019  . Metabolic acidosis [id number]  . Hyperphosphatemia 11/08/2019    No current facility-administered medications on file prior to encounter.   Current Outpatient Medications on File Prior to Encounter  Medication Sig Dispense Refill  . aspirin 325 MG tablet Take 325 mg by mouth daily.    . carvedilol (COREG) 6.25 MG tablet Take 6.25 mg by mouth 2 (two) times daily.    . furosemide (LASIX) 20 MG tablet Take 20 mg by mouth as needed for fluid.    Marland Kitchen meclizine (ANTIVERT) 25 MG tablet Take 25 mg by mouth as needed for dizziness.       Past Medical History:  Diagnosis Date  . Atrial fibrillation ([place])   . Claustrophobia   . HOH (hard of hearing)     History reviewed. No pertinent surgical history.  Social History   Tobacco Use  . Smoking status: Never Smoker  . Smokeless tobacco: Never Used  Substance Use Topics  . Alcohol use: Not on file  . Drug use: Not on file    History reviewed. No pertinent family history.  PE: Vitals:   11/09/19 0241 11/09/19 0500 11/09/19 0643 11/09/19 0912  BP: 124/85  (!) 162/92 (!) 149/81  Pulse: 61  65 (!) 55  Resp: 16  17 18   Temp: 97.7 F (36.5 C)  97.8 F (36.6 C) 98 F (36.7 C)  TempSrc: Oral  Oral Oral  SpO2: 93%  95% 93%  Weight:  98.8 kg    Height:       Patient appears to be in no acute distress  patient is alert and oriented x3 Atraumatic normocephalic head No cervical or supraclavicular lymphadenopathy appreciated No increased work of breathing, no audible wheezes/rhonchi Regular sinus rhythm/rate Abdomen is soft, nontender, nondistended, right-sided CVA and flank tenderness Lower extremities are symmetric without appreciable edema Grossly neurologically intact No identifiable skin lesions  Recent Labs    11/08/19 1722  WBC 9.0  HGB 12.4  HCT 40.0   Recent Labs    11/08/19 1722 11/09/19 0000 11/09/19 0431  NA 141  --  139  K 5.6* 5.4* 5.3*  CL 109  --  109  CO2 21*  --  19*  GLUCOSE  101*  --  84  BUN 98*  --  97*  CREATININE 5.23*  --  5.24*  CALCIUM 8.7*  --  8.3*   No results for input(s): LABPT, INR in the last 72 hours. No results for input(s): LABURIN in the last 72 hours. Results for orders placed or performed during the hospital encounter of 11/08/19  Respiratory Panel by RT PCR (Flu A&B, Covid) - Nasopharyngeal Swab     Status: None   Collection Time: 11/08/19 10:48 PM   Specimen: Nasopharyngeal Swab  Result Value Ref Range Status   SARS Coronavirus 2 by RT PCR NEGATIVE NEGATIVE Final    Comment: (NOTE) SARS-CoV-2  target nucleic acids are NOT DETECTED. The SARS-CoV-2 RNA is generally detectable in upper respiratoy specimens during the acute phase of infection. The lowest concentration of SARS-CoV-2 viral copies this assay can detect is 131 copies/mL. A negative result does not preclude SARS-Cov-2 infection and should not be used as the sole basis for treatment or other patient management decisions. A negative result may occur with  improper specimen collection/handling, submission of specimen other than nasopharyngeal swab, presence of viral mutation(s) within the areas targeted by this assay, and inadequate number of viral copies (<131 copies/mL). A negative result must be combined with clinical observations, patient history, and epidemiological information. The expected result is Negative. Fact Sheet for Patients:  PinkCheek.be Fact Sheet for Healthcare Providers:  GravelBags.it This test is not yet ap proved or cleared by the [place] FDA and  has been authorized for detection and/or diagnosis of SARS-CoV-2 by FDA under an Emergency Use Authorization (EUA). This EUA will remain  in effect (meaning this test can be used) for the duration of the COVID-19 declaration under Section 564(b)(1) of the Act, 21 U.S.C. section 360bbb-3(b)(1), unless the authorization is terminated or revoked sooner.    Influenza A by PCR NEGATIVE NEGATIVE Final   Influenza B by PCR NEGATIVE NEGATIVE Final    Comment: (NOTE) The Xpert Xpress SARS-CoV-2/FLU/RSV assay is intended as an aid in  the diagnosis of influenza from Nasopharyngeal swab specimens and  should not be used as a sole basis for treatment. Nasal washings and  aspirates are unacceptable for Xpert Xpress SARS-CoV-2/FLU/RSV  testing. Fact Sheet for Patients: PinkCheek.be Fact Sheet for Healthcare Providers: GravelBags.it This test is  not yet approved or cleared by the [place] FDA and  has been authorized for detection and/or diagnosis of SARS-CoV-2 by  FDA under an Emergency Use Authorization (EUA). This EUA will remain  in effect (meaning this test can be used) for the duration of the  Covid-19 declaration under Section 564(b)(1) of the Act, 21  U.S.C. section 360bbb-3(b)(1), unless the authorization is  terminated or revoked. Performed at [facility], [place] [address], [place], [place] [address]     Imaging: I have independently reviewed the patient's CT scan demonstrating a large proximal ureteral stones with associated hydronephrosis.  Imp: The patient has acute on chronic renal failure from bilateral ureteral obstruction.  Recommendations: Our plan is to take the patient urgently to the operating room and placed bilateral ureteral stents.  I spoke with her brother as well as the patient collectively about the proposed procedure.  I explained the rationale for it and the risk and associated benefits.  The patient has opted to proceed.    Ardis Hughs

## 2019-11-09 NOTE — Op Note (Signed)
Preoperative diagnosis:  1. Bilateral obstructing urolithiasis 2. Acute on chronic renal failure  Postoperative diagnosis:  1. Same  Procedure:  1. Cystoscopy 2. bilateral ureteral stent placement 3. bilateral retrograde pyelography with interpretation   Surgeon: Ardis Hughs, MD  Anesthesia: General  Complications: None  Intraoperative findings:  bilateral retrograde pyelography demonstrated a filling defect within the bilateral ureter consistent with the patient's known calculus without other abnormalities.  Her obstruction was proximal in both circumstances.  The distal ureters were normal.  The bladder was also normal in appearance.  EBL: Minimal  Specimens: Urine culture  Indication: Gloria [name] is a 74 y.o. patient with acute renal failure and found to have bilateral kidney stones.  After reviewing the management options for treatment, he elected to proceed with the above surgical procedure(s). We have discussed the potential benefits and risks of the procedure, side effects of the proposed treatment, the likelihood of the patient achieving the goals of the procedure, and any potential problems that might occur during the procedure or recuperation. Informed consent has been obtained.  Description of procedure:  The patient was taken to the operating room and general anesthesia was induced.  The patient was placed in the dorsal lithotomy position, prepped and draped in the usual sterile fashion, and preoperative antibiotics were administered. A preoperative time-out was performed.   Cystourethroscopy was performed.  The patient's urethra was examined and was normal. The bladder was then systematically examined in its entirety. There was no evidence for any bladder tumors, stones, or other mucosal pathology.    Attention then turned to the bilateralureteral orifice and a ureteral catheter was used to intubate the ureteral orifice.  Omnipaque contrast was injected  through the ureteral catheter and a retrograde pyelogram was performed with findings as dictated above.  A 0.38 sensor guidewire was then advanced up the bilateral ureter into the renal pelvis under fluoroscopic guidance.  The wire was then backloaded through the cystoscope and a ureteral stent was advance over the wire using Seldinger technique.  The stent was positioned appropriately under fluoroscopic and cystoscopic guidance.  The wire was then removed with an adequate stent curl noted in the renal pelvis as well as in the bladder.  The bladder was then emptied and the procedure ended.  The patient appeared to tolerate the procedure well and without complications.  The patient was able to be awakened and transferred to the recovery unit in satisfactory condition.   Given the hydronephrotic drip and the sediment in the patient's right collecting system that was effluxing from the stent I opted to send a urine culture for further analysis.  Ardis Hughs, M.D.

## 2019-11-10 DIAGNOSIS — N200 Calculus of kidney: Secondary | ICD-10-CM

## 2019-11-10 LAB — CBC
HCT: 38 % (ref 36.0–46.0)
Hemoglobin: 11.5 g/dL — ABNORMAL LOW (ref 12.0–15.0)
MCH: 31.4 pg (ref 26.0–34.0)
MCHC: 30.3 g/dL (ref 30.0–36.0)
MCV: 103.8 fL — ABNORMAL HIGH (ref 80.0–100.0)
Platelets: 237 10*3/uL (ref 150–400)
RBC: 3.66 MIL/uL — ABNORMAL LOW (ref 3.87–5.11)
RDW: 13 % (ref 11.5–15.5)
WBC: 6.6 10*3/uL (ref 4.0–10.5)
nRBC: 0 % (ref 0.0–0.2)

## 2019-11-10 LAB — MAGNESIUM: Magnesium: 1.9 mg/dL (ref 1.7–2.4)

## 2019-11-10 LAB — COMPREHENSIVE METABOLIC PANEL
ALT: 13 U/L (ref 0–44)
AST: 13 U/L — ABNORMAL LOW (ref 15–41)
Albumin: 2.7 g/dL — ABNORMAL LOW (ref 3.5–5.0)
Alkaline Phosphatase: 57 U/L (ref 38–126)
Anion gap: 8 (ref 5–15)
BUN: 79 mg/dL — ABNORMAL HIGH (ref 8–23)
CO2: 22 mmol/L (ref 22–32)
Calcium: 8.5 mg/dL — ABNORMAL LOW (ref 8.9–10.3)
Chloride: 115 mmol/L — ABNORMAL HIGH (ref 98–111)
Creatinine, Ser: 3.53 mg/dL — ABNORMAL HIGH (ref 0.44–1.00)
GFR calc Af Amer: 14 mL/min — ABNORMAL LOW (ref >60)
GFR calc non Af Amer: 12 mL/min — ABNORMAL LOW (ref >60)
Glucose, Bld: 128 mg/dL — ABNORMAL HIGH (ref 70–99)
Potassium: 5.4 mmol/L — ABNORMAL HIGH (ref 3.5–5.1)
Sodium: 145 mmol/L (ref 135–145)
Total Bilirubin: 0.3 mg/dL (ref 0.3–1.2)
Total Protein: 6.1 g/dL — ABNORMAL LOW (ref 6.5–8.1)

## 2019-11-10 LAB — URINE CULTURE: Culture: NO GROWTH

## 2019-11-10 LAB — PHOSPHORUS: Phosphorus: 5.2 mg/dL — ABNORMAL HIGH (ref 2.5–4.6)

## 2019-11-10 MED ORDER — MIRABEGRON ER 50 MG PO TB24: 50.0000 mg | ORAL_TABLET | Freq: Every day | ORAL | 11 refills | Status: DC

## 2019-11-10 MED ORDER — OXYCODONE-ACETAMINOPHEN 5-325 MG PO TABS: 1.0000 | ORAL_TABLET | Freq: Four times a day (QID) | ORAL | Status: DC | PRN

## 2019-11-10 MED ORDER — MIRABEGRON ER 25 MG PO TB24
50.0000 mg | ORAL_TABLET | Freq: Every day | ORAL | Status: DC
  Administered 2019-11-10 – 2019-11-11 (×2): 50 mg via ORAL
  Filled 2019-11-10 (×3): qty 2

## 2019-11-10 NOTE — H&P (View-Only) (Signed)
Urology Inpatient Progress Report  Bilateral hydronephrosis [N13.30] Acute renal failure (ARF) (HCC) [N17.9] Acute renal failure, unspecified acute renal failure type ([place]) [N17.9] Bilateral renal stones [N20.0]  Procedure(s): CYSTOSCOPY BILATERAL URETERAL STENT PLACEMENT  1 Day Post-Op   Intv/Subj: No acute events overnight. Patient is without complaint. Patient feels significantly better this morning. Kidney function has improved.  Principal Problem:   Acute renal failure (ARF) (HCC) Active Problems:   Obstructive uropathy   Hyperkalemia   Metabolic acidosis   Hyperphosphatemia  Current Facility-Administered Medications  Medication Dose Route Frequency Provider Last Rate Last Admin  . 0.9 %  sodium chloride infusion  250 mL Intravenous PRN Shela Leff, MD      . acetaminophen (TYLENOL) tablet 650 mg  650 mg Oral Q4H PRN Shela Leff, MD      . aspirin tablet 325 mg  325 mg Oral Daily Shela Leff, MD   325 mg at 11/10/19 0907  . carvedilol (COREG) tablet 6.25 mg  6.25 mg Oral BID Shela Leff, MD   6.25 mg at 11/10/19 0907  . Chlorhexidine Gluconate Cloth 2 % PADS 6 each  6 each Topical Daily Pokhrel, Laxman, MD   6 each at 11/10/19 0907  . fentaNYL (SUBLIMAZE) injection 12.5 mcg  12.5 mcg Intravenous Q2H PRN Shela Leff, MD      . hydrALAZINE (APRESOLINE) injection 5 mg  5 mg Intravenous Q4H PRN Shela Leff, MD      . sodium chloride flush (NS) 0.9 % injection 3 mL  3 mL Intravenous Q12H Shela Leff, MD   3 mL at 11/10/19 0908  . sodium chloride flush (NS) 0.9 % injection 3 mL  3 mL Intravenous PRN Shela Leff, MD         Objective: Vital: Vitals:   11/09/19 2114 11/10/19 0107 11/10/19 0502 11/10/19 0854  BP: (!) 125/56 (!) 136/109 (!) 141/68 (!) 134/59  Pulse: (!) 56 60 (!) 52 (!) 58  Resp: 16 16 16    Temp: 97.7 F (36.5 C) 97.6 F (36.4 C) 97.6 F (36.4 C)   TempSrc: Oral Oral Oral   SpO2: 94% 92% 94% 93%   Weight:      Height:       I/Os: I/O last 3 completed shifts: In: 39 [P.O.:360; I.V.:500] Out: 2700 [Urine:2700]  Physical Exam:  General: Patient is in no apparent distress Lungs: Normal respiratory effort, chest expands symmetrically. GI: Incisions are c/d/i.  The abdomen is soft and nontender without mass. Foley: Clear yellow urine Ext: lower extremities symmetric  Lab Results: Recent Labs    11/08/19 1722 11/10/19 0455  WBC 9.0 6.6  HGB 12.4 11.5*  HCT 40.0 38.0   Recent Labs    11/08/19 1722 11/08/19 1722 11/09/19 0000 11/09/19 0431 11/10/19 0455  NA 141  --   --  139 145  K 5.6*   < > 5.4* 5.3* 5.4*  CL 109  --   --  109 115*  CO2 21*  --   --  19* 22  GLUCOSE 101*  --   --  84 128*  BUN 98*  --   --  97* 79*  CREATININE 5.23*  --   --  5.24* 3.53*  CALCIUM 8.7*  --   --  8.3* 8.5*   < > = values in this interval not displayed.   No results for input(s): LABPT, INR in the last 72 hours. No results for input(s): LABURIN in the last 72 hours. Results for orders placed or  performed during the hospital encounter of 11/08/19  Respiratory Panel by RT PCR (Flu A&B, Covid) - Nasopharyngeal Swab     Status: None   Collection Time: 11/08/19 10:48 PM   Specimen: Nasopharyngeal Swab  Result Value Ref Range Status   SARS Coronavirus 2 by RT PCR NEGATIVE NEGATIVE Final    Comment: (NOTE) SARS-CoV-2 target nucleic acids are NOT DETECTED. The SARS-CoV-2 RNA is generally detectable in upper respiratoy specimens during the acute phase of infection. The lowest concentration of SARS-CoV-2 viral copies this assay can detect is 131 copies/mL. A negative result does not preclude SARS-Cov-2 infection and should not be used as the sole basis for treatment or other patient management decisions. A negative result may occur with  improper specimen collection/handling, submission of specimen other than nasopharyngeal swab, presence of viral mutation(s) within the areas targeted  by this assay, and inadequate number of viral copies (<131 copies/mL). A negative result must be combined with clinical observations, patient history, and epidemiological information. The expected result is Negative. Fact Sheet for Patients:  PinkCheek.be Fact Sheet for Healthcare Providers:  GravelBags.it This test is not yet ap proved or cleared by the [place] FDA and  has been authorized for detection and/or diagnosis of SARS-CoV-2 by FDA under an Emergency Use Authorization (EUA). This EUA will remain  in effect (meaning this test can be used) for the duration of the COVID-19 declaration under Section 564(b)(1) of the Act, 21 U.S.C. section 360bbb-3(b)(1), unless the authorization is terminated or revoked sooner.    Influenza A by PCR NEGATIVE NEGATIVE Final   Influenza B by PCR NEGATIVE NEGATIVE Final    Comment: (NOTE) The Xpert Xpress SARS-CoV-2/FLU/RSV assay is intended as an aid in  the diagnosis of influenza from Nasopharyngeal swab specimens and  should not be used as a sole basis for treatment. Nasal washings and  aspirates are unacceptable for Xpert Xpress SARS-CoV-2/FLU/RSV  testing. Fact Sheet for Patients: PinkCheek.be Fact Sheet for Healthcare Providers: GravelBags.it This test is not yet approved or cleared by the [place] FDA and  has been authorized for detection and/or diagnosis of SARS-CoV-2 by  FDA under an Emergency Use Authorization (EUA). This EUA will remain  in effect (meaning this test can be used) for the duration of the  Covid-19 declaration under Section 564(b)(1) of the Act, 21  U.S.C. section 360bbb-3(b)(1), unless the authorization is  terminated or revoked. Performed at [facility], [place] [address], [place], [place] [address]   Anaerobic culture     Status: None (Preliminary result)   Collection Time:  11/09/19 11:08 AM   Specimen: Urine, Cystoscope; Urinary  Result Value Ref Range Status   Specimen Description   Final    CYSTOSCOPY Performed at [place] [address], [place], [place] [address]    Special Requests   Final    STENT Performed at [facility], [place] [address] [place], [place] [address]    Gram Stain   Final    CYTOSPIN SMEAR WBC PRESENT, PREDOMINANTLY PMN NO ORGANISMS SEEN Performed at [facility], [place] [address] [place], [place] [address]    Culture PENDING  Incomplete   Report Status PENDING  Incomplete    Studies/Results: CT CHEST WO CONTRAST  Result Date: 11/09/2019 CLINICAL DATA:  74 year old female with pleural effusion seen on the x-ray. EXAM: CT CHEST WITHOUT CONTRAST TECHNIQUE: Multidetector CT imaging of the chest was performed following the standard protocol without IV contrast. COMPARISON:  Chest radiograph dated 11/08/2019. FINDINGS: Evaluation of this exam is limited in the absence of intravenous contrast. Cardiovascular: There is moderate cardiomegaly. Advanced coronary vascular calcification of the LAD as well as calcification of the mitral annulus. There is mild atherosclerotic calcification of the thoracic aorta. There is enlargement of the main pulmonary trunk suggestive of a degree of pulmonary hypertension. Clinical correlation is recommended. Mediastinum/Nodes: There is no hilar or mediastinal adenopathy. The esophagus is grossly unremarkable. No mediastinal fluid collection. Lungs/Pleura: There is a moderate right pleural effusion with partial compressive atelectasis of the right lower lobe. Pneumonia is not excluded clinical correlation is recommended. Bibasilar linear and bandlike atelectasis/scarring. There is no pneumothorax. The central airways are patent. Upper Abdomen: Partially visualized air within the right kidney may be related to recent instrumentation or infection. Clinical correlation is  recommended. There is an indeterminate 2 cm right adrenal nodule. Musculoskeletal: Osteopenia with degenerative changes of the spine. No acute osseous pathology. IMPRESSION: 1. Moderate right pleural effusion with partial compressive atelectasis of the right lower lobe. Pneumonia is not excluded. Clinical correlation is recommended. 2. Moderate cardiomegaly with advanced coronary vascular calcification of the LAD. 3. Partially visualized air within the right kidney may be related to recent instrumentation or infection. Clinical correlation is recommended. 4. Aortic Atherosclerosis (ICD10-I70.0). Electronically Signed   By: Anner Crete M.D.   On: 11/09/2019 20:37   US Renal  Result Date: 11/08/2019 CLINICAL DATA:  74 year old female with acute renal insufficiency. EXAM: RENAL / URINARY TRACT ULTRASOUND COMPLETE COMPARISON:  None. FINDINGS: Evaluation is very limited due to body habitus and inability of the patient to cooperate with exam. Right Kidney: Renal measurements: 13.4 x 7.3 x 8.5 cm = volume: 437 mL. There is mild increased renal parenchymal echogenicity. There is moderate hydronephrosis. Inferior pole stone or clusters of adjacent stones measure up to 2.5 cm. Left Kidney: Renal measurements: 12.5 x 6.1 x 7.0 cm = volume: 278 mL. The left kidney is suboptimally visualized. There is mild increased echogenicity. There is moderate left hydronephrosis. No shadowing stone. Bladder: Not visualized. Other: None. IMPRESSION: 1. Increased renal parenchymal echogenicity in keeping with underlying kidney disease. 2. Moderate bilateral hydronephrosis. 3. Right renal calculi. Electronically Signed   By: Anner Crete M.D.   On: 11/08/2019 20:55   DG Chest Port 1 View  Result Date: 11/08/2019 CLINICAL DATA:  Severe anxiety.  Short of breath and weakness EXAM: PORTABLE CHEST 1 VIEW COMPARISON:  12/28/2018 FINDINGS: Moderate enlargement of the cardiopericardial silhouette. No mediastinal or hilar masses. Clear  lungs.  No convincing pleural effusion and no pneumothorax. Skeletal structures are grossly intact. IMPRESSION: 1. No acute cardiopulmonary disease. 2. Stable moderate cardiomegaly. Electronically Signed   By: Lajean Manes M.D.   On: 11/08/2019 18:28   DG C-Arm 1-60 Min-No Report  Result Date: 11/09/2019 Fluoroscopy was utilized by the requesting physician.  No radiographic interpretation.   ECHOCARDIOGRAM COMPLETE  Result Date: 11/09/2019    ECHOCARDIOGRAM REPORT   Patient Name:   NAKILA HAMELL Date of Exam: 11/09/2019 Medical Rec #:  [id number]           Height:       63.0 in Accession #:    [id number]          Weight:       217.8 lb Date of Birth:  [birth year]            BSA:          2.005 m Patient Age:  73 years            BP:           132/78 mmHg Patient Gender: F                   HR:           53 bpm. Exam Location:  Inpatient Procedure: 2D Echo, Cardiac Doppler and Color Doppler Indications:    I50.33 Acute on chronic diastolic (congestive) heart failure  History:        Patient has no prior history of Echocardiogram examinations.                 CHF, Abnormal ECG; Arrythmias:Atrial Fibrillation.  Sonographer:    Roseanna Rainbow RDCS Referring Phys: [id number] [facility]  Sonographer Comments: Patient is morbidly obese and Technically difficult study due to poor echo windows. Patient complained of pain form pressure in apical region. IMPRESSIONS  1. Left ventricular ejection fraction, by estimation, is 60 to 65%. The left ventricle has normal function. The left ventricle has no regional wall motion abnormalities. There is severe left ventricular hypertrophy. Left ventricular diastolic function could not be evaluated.  2. Right ventricular systolic function is normal. The right ventricular size is moderately enlarged. There is moderately elevated pulmonary artery systolic pressure. The estimated right ventricular systolic pressure is [id number] mmHg.  3. Left atrial size was severely dilated.  4. Right  atrial size was mildly dilated.  5. The mitral valve is normal in structure. Mild mitral valve regurgitation.  6. Tricuspid valve regurgitation is moderate.  7. The aortic valve is tricuspid. Aortic valve regurgitation is mild. No aortic stenosis is present.  8. Aortic dilatation noted. 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  9. The inferior vena cava is dilated in size with <50% respiratory variability, suggesting right atrial pressure of 15 mmHg. FINDINGS  Left Ventricle: Left ventricular ejection fraction, by estimation, is 60 to 65%. The left ventricle has normal function. The left ventricle has no regional wall motion abnormalities. The left ventricular internal cavity size was normal in size. There is  severe left ventricular hypertrophy. Left ventricular diastolic function could not be evaluated due to atrial fibrillation. Left ventricular diastolic function could not be evaluated. Right Ventricle: The right ventricular size is moderately enlarged. No increase in right ventricular wall thickness. Right ventricular systolic function is normal. There is moderately elevated pulmonary artery systolic pressure. The tricuspid regurgitant  velocity is 3.19 m/s, and with an assumed right atrial pressure of 15 mmHg, the estimated right ventricular systolic pressure is 99991111 mmHg. Left Atrium: Left atrial size was severely dilated. Right Atrium: Right atrial size was mildly dilated. Pericardium: Trivial pericardial effusion is present. Mitral Valve: The mitral valve is normal in structure. Moderate mitral annular calcification. Mild mitral valve regurgitation. MV peak gradient, 11.0 mmHg. The mean mitral valve gradient is 3.0 mmHg. Tricuspid Valve: The tricuspid valve is normal in structure. Tricuspid valve regurgitation is moderate. Aortic Valve: The aortic valve is tricuspid. . There is mild thickening of the aortic valve. Aortic valve regurgitation is mild. No aortic stenosis is present.  Mild aortic valve annular calcification. There is mild thickening of the aortic valve. Aortic valve mean gradient measures 5.0 mmHg. Aortic valve peak gradient measures 11.4 mmHg. Aortic valve area, by VTI measures 1.77 cm. Pulmonic Valve: The pulmonic valve was normal in structure. Pulmonic valve regurgitation is not visualized. No evidence of pulmonic stenosis. Aorta: Aortic dilatation noted.  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 Venous: The inferior vena cava is dilated in size with less than 50% respiratory variability, suggesting right atrial pressure of 15 mmHg. IAS/Shunts: The atrial septum is grossly normal.  LEFT VENTRICLE PLAX 2D LVIDd:         3.72 cm     Diastology LVIDs:         1.83 cm     LV e' lateral:   6.58 cm/s LV PW:         1.82 cm     LV E/e' lateral: 23.3 LV IVS:        2.09 cm     LV e' medial:    5.06 cm/s LVOT diam:     1.80 cm     LV E/e' medial:  30.3 LV SV:         65 LV SV Index:   32 LVOT Area:     2.54 cm  LV Volumes (MOD) LV vol d, MOD A2C: 45.7 ml LV vol d, MOD A4C: 60.5 ml LV vol s, MOD A2C: 19.7 ml LV vol s, MOD A4C: 15.8 ml LV SV MOD A2C:     26.0 ml LV SV MOD A4C:     60.5 ml LV SV MOD BP:      37.7 ml RIGHT VENTRICLE            IVC RV S prime:     7.97 cm/s  IVC diam: 3.03 cm TAPSE (M-mode): 1.9 cm LEFT ATRIUM              Index       RIGHT ATRIUM           Index LA diam:        5.10 cm  2.54 cm/m  RA Area:     23.90 cm LA Vol (A2C):   64.4 ml  32.12 ml/m RA Volume:   78.80 ml  39.30 ml/m LA Vol (A4C):   164.0 ml 81.79 ml/m LA Biplane Vol: 106.0 ml 52.87 ml/m  AORTIC VALVE AV Area (Vmax):    1.64 cm AV Area (Vmean):   1.72 cm AV Area (VTI):     1.77 cm AV Vmax:           169.00 cm/s AV Vmean:          105.000 cm/s AV VTI:            0.369 m AV Peak Grad:      11.4 mmHg AV Mean Grad:      5.0 mmHg LVOT Vmax:         109.00 cm/s LVOT Vmean:        70.900 cm/s LVOT VTI:          0.256 m LVOT/AV VTI ratio: 0.69  AORTA Ao Root diam: 3.60 cm  Ao Asc diam:  4.00 cm MITRAL VALVE                TRICUSPID VALVE MV Area (PHT): 3.32 cm     TR Peak grad:   40.7 mmHg MV Peak grad:  11.0 mmHg    TR Vmax:        319.00 cm/s MV Mean grad:  3.0 mmHg MV Vmax:       1.66 m/s     SHUNTS MV Vmean:      72.2 cm/s    Systemic VTI:  0.26 m MV Decel Time: 229 msec  Systemic Diam: 1.80 cm MV E velocity: 153.50 cm/s MV A velocity: 41.60 cm/s MV E/A ratio:  3.69 Mertie Moores MD Electronically signed by Mertie Moores MD Signature Date/Time: 11/09/2019/2:16:33 PM    Final    CT Renal Stone Study  Result Date: 11/08/2019 CLINICAL DATA:  Flank pain EXAM: CT ABDOMEN AND PELVIS WITHOUT CONTRAST TECHNIQUE: Multidetector CT imaging of the abdomen and pelvis was performed following the standard protocol without IV contrast. COMPARISON:  CT 07/03/2015, ultrasound 11/08/2019 FINDINGS: Lower chest: Moderate right pleural effusion.  Cardiomegaly. Hepatobiliary: Status post cholecystectomy. No focal hepatic abnormality or biliary dilatation. Pancreas: Unremarkable. No pancreatic ductal dilatation or surrounding inflammatory changes. Spleen: Normal in size without focal abnormality. Adrenals/Urinary Tract: 2.6 cm right adrenal adenoma. 2.3 cm left adrenal adenoma. These are unchanged. Moderate to marked right hydronephrosis and moderate left hydronephrosis. Numerous intrarenal stones on the right. Twelve by 16 mm stone in the left renal pelvis. Sixteen by 15 mm stone in the right renal pelvis. Multiple additional stones in the lower pole of the right kidney. Considerable right perinephric stranding. No distal ureteral stones. Urinary bladder is unremarkable Stomach/Bowel: The stomach is nonenlarged. No dilated small bowel. No bowel wall thickening. Sigmoid colon diverticula without acute inflammatory change Vascular/Lymphatic: Moderate aortic atherosclerosis without aneurysm. No suspicious adenopathy Reproductive: Uterus unremarkable. Stable ovoid calcification in the left adnexa  Other: No free air. Small pelvic fluid. Left paramidline ventral hernia containing mesenteric fat. Generalized subcutaneous edema consistent with anasarca. Musculoskeletal: No acute or suspicious osseous abnormality IMPRESSION: 1. Moderate severe right hydronephrosis and moderate left hydronephrosis. Large stones within the bilateral renal pelvises measuring 16 mm on the right and 16 mm on the left, contributing to bilateral hydronephrosis. No ureteral stones. Multiple additional intrarenal stones on the right 2. Stable bilateral adrenal gland adenomas. 3. Left abdominal ventral hernia containing fat 4. Sigmoid colon diverticular disease without acute inflammatory change 5. Cardiomegaly.  Moderate right pleural effusion Electronically Signed   By: Donavan Foil M.D.   On: 11/08/2019 22:40    Assessment: Procedure(s): CYSTOSCOPY BILATERAL URETERAL STENT PLACEMENT, 1 Day Post-Op  doing well.  Renal function has improved quite significantly.  Her pain is also getting better.  She has no signs or symptoms of infection today.  Plan: DC Foley catheter.  Start Myrbetriq for overactive bladder with urinary urge incontinence.  I have sent a prescription home with the patient for this.  Does not appear that she will require any pain medicine going home. Plan for last 2 schedule her for removal of her kidney stones approximately 2 weeks.  I communicated the plan as above both with the patient and her brother over the phone.  I will sign off on this patient, please contact me for any additional questions or concerns.   Louis Meckel, MD Urology 11/10/2019, 9:09 AM

## 2019-11-10 NOTE — Discharge Instructions (Signed)
DISCHARGE INSTRUCTIONS FOR KIDNEY STONE/URETERAL STENT   MEDICATIONS:  1.  Resume all your other meds from home - except do not take any extra narcotic pain meds that you may have at home.  2. Myrbetriq is for urinary urgency and incontinence.  ACTIVITY:  1. No strenuous activity x 1week  2. No driving while on narcotic pain medications  3. Drink plenty of water  4. Continue to walk at home - you can still get blood clots when you are at home, so keep active, but don't over do it.  5. May return to work/school tomorrow or when you feel ready   BATHING:  1. You can shower and we recommend daily showers    SIGNS/SYMPTOMS TO CALL:  Please call us if you have a fever greater than 101.5, uncontrolled nausea/vomiting, uncontrolled pain, dizziness, unable to urinate, bloody urine, chest pain, shortness of breath, leg swelling, leg pain, redness around wound, drainage from wound, or any other concerns or questions.   You can reach [place] at [phone].   FOLLOW-UP:  1. You will be scheduled for kidney stone removal in 2 weeks.

## 2019-11-10 NOTE — Progress Notes (Signed)
PROGRESS NOTE  Gloria [name] [id number] DOB: 09-01-1945 DOA: 11/08/2019 PCP: Maryella Shivers, MD   LOS: 2 days   Brief narrative: As per HPI,  Gloria [name] is a 74 y.o. female with past medical history significant of atrial fibrillation, hypertension was sent to the emergency department for abnormal labs.  Patient was a hard of hearing but  reads lips.  Information was obtained from the patient's brother who stated that the patient had gone to her primary care physician for elevated blood pressure and had elevated creatinine of 7.0.  Repeat labs were done which showed a creatinine of 5.  Patient was also complaining of groin pain shortness of breath.  History of chronic leg swelling.  Patient does live by herself at home.  ED Course:  Patient did not have any fever.  Labs showed no leukocytosis.  BUN 98, creatinine 5.2. GFR 8. Potassium 5.6. Bicarb 21, anion gap 11. BNP 345.  Phosphorus 6.6. UA with evidence of microscopic hematuria but no significant pyuria or bacteriuria to suggest infection. SARS-CoV-2 PCR test pending. Chest x-ray showing stable moderate cardiomegaly and no acute cardiopulmonary disease. Renal ultrasound showing increased renal parenchymal echogenicity, right renal calculi, and moderate bilateral hydronephrosis. CT renal stone study showing moderate to severe right hydronephrosis and moderate left hydronephrosis. Large stones within the bilateral renal pelvises measuring 16 mm in the right and 16 mm on the left, contributing to bilateral hydronephrosis. No ureteral stones. Multiple additional intrarenal stones on the right.  CT renal stone study also showing cardiomegaly and moderate right pleural effusion.  Assessment/Plan:  Principal Problem:   Acute renal failure (ARF) (HCC) Active Problems:   Obstructive uropathy   Hyperkalemia   Metabolic acidosis   Hyperphosphatemia  Acute kidney injury secondary to obstructive uropathy:  BUN 98, creatinine 5.2. GFR  8 on presentation.  CT showing large stones within the bilateral renal pelvises contributing to bilateral hydronephrosis (moderate to severe in the right and moderate on the left). Multiple additional intrarenal stones on the right.  Status post bilateral retrograde pyelography with bilateral ureteral stent placement on 11/09/2019.  Improvement in renal function noted today.  Creatinine has gone down to 3.5 today from 5.2.  We will continue to monitor.  Patient has been started on Myrbetriq by urology.  Obstructive uropathy: CT showing large stones within the bilateral renal pelvises contributing to bilateral hydronephrosis (moderate to severe in the right and moderate on the left). Multiple additional intrarenal stones on the right. No signs of infection.  Status post bilateral ureteral stent placement.  Continue Myrbetriq.  Patient will need to follow-up with urology as outpatient for definitive procedure.  Urology planning to discontinue Foley catheter today.  Mild intermittent right groin pain: Improved.  Likely to be renal colic.  CT scan with renal stones..  Mild hyperkalemia: Received Lokelma.  Slightly improved, potassium level today potassium of 5.4.  Check BMP in a.m. will not treat this number today.  Renal function is starting to improve.  Mild normal anion gap metabolic acidosis: Improved.  Anion gap of 11 presentation.  Gap of 8 today.  Bicarb of 22.Marland Kitchen  Hyperphosphatemia:  [place] secondary to acute kidney injury,phosphorus 6.6 presentation.  Improved to 5.2 today.  Volume overload: Likely secondary to acute renal failure.  No prior history of congestive heart failure.  BNP elevated at 345.  Peripheral edema on exam.  Chest x-ray with moderate cardiomegaly.  CT scan with moderate pleural effusion.   2D echocardiogram from 11/09/2019 shows LV ejection  fraction of 60 to 65% with severe LVH and elevated pulmonary artery pressure at 55.Marland Kitchen  No dyspnea and appears to be compensated.  Atrial  fibrillation: on aspirin 325 mg daily. Not on anticoagulation due to high risk of falls. Continue home aspirin and Coreg.  Rate controlled at this time.   Hypertension:  Continue Coreg, hydralazine as needed for SBP >170.   VTE Prophylaxis: Sequential compression device  Code Status: Full code  Family Communication:  None today. Spoke with the patient's brother at bedside yesterday.  Status is: Inpatient  Remains inpatient appropriate because: Inpatient level of care appropriate due to severity of illness, status post urological intervention, significant acute kidney injury  Dispo: The patient is from: Home              Anticipated d/c is to: Home              Anticipated d/c date is: 2 days              Patient currently is not medically stable to d/c.  Consultants:  Urology  Procedures:  Retrograde ureteroscopy with bilateral ureteric stent placement on 11/19/2019 by urology.  Antibiotics:  . Prophylactic antibiotic with ciprofloxacin  Anti-infectives (From admission, onward)   Start     Dose/Rate Route Frequency Ordered Stop   11/09/19 1115  ciprofloxacin (CIPRO) IVPB 400 mg     400 mg 200 mL/hr over 60 Minutes Intravenous  Once 11/09/19 1029 11/09/19 1038   11/09/19 1029  ciprofloxacin (CIPRO) 400 MG/200ML IVPB    Note to Pharmacy: Marquis Buggy   : cabinet override      11/09/19 1029 11/09/19 1047     Subjective: Today, patient was seen and examined at bedside.  Feels little better.  Less anxious.  Denies abdominal pain.  Denies nausea vomiting cough or shortness of breath.    Objective: Vitals:   11/10/19 0502 11/10/19 0854  BP: (!) 141/68 (!) 134/59  Pulse: (!) 52 (!) 58  Resp: 16   Temp: 97.6 F (36.4 C)   SpO2: 94% 93%    Intake/Output Summary (Last 24 hours) at 11/10/2019 0942 Last data filed at 11/10/2019 0127 Gross per 24 hour  Intake 860 ml  Output 2700 ml  Net -1840 ml   Filed Weights   11/08/19 1617 11/09/19 0500  Weight: 102.1 kg 98.8 kg    Body mass index is 38.58 kg/m.   Physical Exam: GENERAL: Patient is alert awake and oriented. Not in obvious distress.  Morbidly obese,  hard of hearing but able to read lips. HENT: No scleral pallor or icterus. Pupils equally reactive to light. Oral mucosa is moist NECK: is supple, no gross swelling noted. CHEST:   Diminished breath sounds bilaterally. CVS: S1 and S2 heard, no murmur.  Irregular rhythm. ABDOMEN: Soft, non-tender, bowel sounds are present.  Foley catheter in place. EXTREMITIES: Redness of the bilateral lower extremities, knee tenderness on palpation. CNS: Cranial nerves are intact. No focal motor deficits. SKIN: warm and dry without rashes.  Data Review: I have personally reviewed the following laboratory data and studies,  CBC: Recent Labs  Lab 11/08/19 1722 11/10/19 0455  WBC 9.0 6.6  NEUTROABS 7.9*  --   HGB 12.4 11.5*  HCT 40.0 38.0  MCV 102.6* 103.8*  PLT 260 [id number]   Basic Metabolic Panel: Recent Labs  Lab 11/08/19 1722 11/09/19 0000 11/09/19 0431 11/10/19 0455  NA 141  --  139 145  K 5.6* 5.4* 5.3* 5.4*  CL  109  --  109 115*  CO2 21*  --  19* 22  GLUCOSE 101*  --  84 128*  BUN 98*  --  97* 79*  CREATININE 5.23*  --  5.24* 3.53*  CALCIUM 8.7*  --  8.3* 8.5*  MG 2.1  --   --  1.9  PHOS 6.6*  --  6.6* 5.2*   Liver Function Tests: Recent Labs  Lab 11/08/19 1722 11/09/19 0431 11/10/19 0455  AST 14*  --  13*  ALT 14  --  13  ALKPHOS 66  --  57  BILITOT 0.6  --  0.3  PROT 6.9  --  6.1*  ALBUMIN 3.3* 3.0* 2.7*   No results for input(s): LIPASE, AMYLASE in the last 168 hours. No results for input(s): AMMONIA in the last 168 hours. Cardiac Enzymes: No results for input(s): CKTOTAL, CKMB, CKMBINDEX, TROPONINI in the last 168 hours. BNP (last 3 results) Recent Labs    11/08/19 1722  BNP 345.3*    ProBNP (last 3 results) No results for input(s): PROBNP in the last 8760 hours.  CBG: No results for input(s): GLUCAP in the last 168  hours. Recent Results (from the past 240 hour(s))  Respiratory Panel by RT PCR (Flu A&B, Covid) - Nasopharyngeal Swab     Status: None   Collection Time: 11/08/19 10:48 PM   Specimen: Nasopharyngeal Swab  Result Value Ref Range Status   SARS Coronavirus 2 by RT PCR NEGATIVE NEGATIVE Final    Comment: (NOTE) SARS-CoV-2 target nucleic acids are NOT DETECTED. The SARS-CoV-2 RNA is generally detectable in upper respiratoy specimens during the acute phase of infection. The lowest concentration of SARS-CoV-2 viral copies this assay can detect is 131 copies/mL. A negative result does not preclude SARS-Cov-2 infection and should not be used as the sole basis for treatment or other patient management decisions. A negative result may occur with  improper specimen collection/handling, submission of specimen other than nasopharyngeal swab, presence of viral mutation(s) within the areas targeted by this assay, and inadequate number of viral copies (<131 copies/mL). A negative result must be combined with clinical observations, patient history, and epidemiological information. The expected result is Negative. Fact Sheet for Patients:  PinkCheek.be Fact Sheet for Healthcare Providers:  GravelBags.it This test is not yet ap proved or cleared by the [place] FDA and  has been authorized for detection and/or diagnosis of SARS-CoV-2 by FDA under an Emergency Use Authorization (EUA). This EUA will remain  in effect (meaning this test can be used) for the duration of the COVID-19 declaration under Section 564(b)(1) of the Act, 21 U.S.C. section 360bbb-3(b)(1), unless the authorization is terminated or revoked sooner.    Influenza A by PCR NEGATIVE NEGATIVE Final   Influenza B by PCR NEGATIVE NEGATIVE Final    Comment: (NOTE) The Xpert Xpress SARS-CoV-2/FLU/RSV assay is intended as an aid in  the diagnosis of influenza from Nasopharyngeal  swab specimens and  should not be used as a sole basis for treatment. Nasal washings and  aspirates are unacceptable for Xpert Xpress SARS-CoV-2/FLU/RSV  testing. Fact Sheet for Patients: PinkCheek.be Fact Sheet for Healthcare Providers: GravelBags.it This test is not yet approved or cleared by the [place] FDA and  has been authorized for detection and/or diagnosis of SARS-CoV-2 by  FDA under an Emergency Use Authorization (EUA). This EUA will remain  in effect (meaning this test can be used) for the duration of the  Covid-19 declaration under Section 564(b)(1) of the  Act, 21  U.S.C. section 360bbb-3(b)(1), unless the authorization is  terminated or revoked. Performed at [facility], [place] [address] [place], [place] [address]   Anaerobic culture     Status: None (Preliminary result)   Collection Time: 11/09/19 11:08 AM   Specimen: Urine, Cystoscope; Urinary  Result Value Ref Range Status   Specimen Description   Final    CYSTOSCOPY Performed at [facility] [address] [place], [place] [address]    Special Requests   Final    STENT Performed at [facility], [place] [address] [place], [place] [address]    Gram Stain   Final    CYTOSPIN SMEAR WBC PRESENT, PREDOMINANTLY PMN NO ORGANISMS SEEN Performed at [facility], [place] [address], [place], [place] [address]    Culture PENDING  Incomplete   Report Status PENDING  Incomplete     Studies: CT CHEST WO CONTRAST  Result Date: 11/09/2019 CLINICAL DATA:  74 year old female with pleural effusion seen on the x-ray. EXAM: CT CHEST WITHOUT CONTRAST TECHNIQUE: Multidetector CT imaging of the chest was performed following the standard protocol without IV contrast. COMPARISON:  Chest radiograph dated 11/08/2019. FINDINGS: Evaluation of this exam is limited in the absence of intravenous contrast. Cardiovascular:  There is moderate cardiomegaly. Advanced coronary vascular calcification of the LAD as well as calcification of the mitral annulus. There is mild atherosclerotic calcification of the thoracic aorta. There is enlargement of the main pulmonary trunk suggestive of a degree of pulmonary hypertension. Clinical correlation is recommended. Mediastinum/Nodes: There is no hilar or mediastinal adenopathy. The esophagus is grossly unremarkable. No mediastinal fluid collection. Lungs/Pleura: There is a moderate right pleural effusion with partial compressive atelectasis of the right lower lobe. Pneumonia is not excluded clinical correlation is recommended. Bibasilar linear and bandlike atelectasis/scarring. There is no pneumothorax. The central airways are patent. Upper Abdomen: Partially visualized air within the right kidney may be related to recent instrumentation or infection. Clinical correlation is recommended. There is an indeterminate 2 cm right adrenal nodule. Musculoskeletal: Osteopenia with degenerative changes of the spine. No acute osseous pathology. IMPRESSION: 1. Moderate right pleural effusion with partial compressive atelectasis of the right lower lobe. Pneumonia is not excluded. Clinical correlation is recommended. 2. Moderate cardiomegaly with advanced coronary vascular calcification of the LAD. 3. Partially visualized air within the right kidney may be related to recent instrumentation or infection. Clinical correlation is recommended. 4. Aortic Atherosclerosis (ICD10-I70.0). Electronically Signed   By: Anner Crete M.D.   On: 11/09/2019 20:37   US Renal  Result Date: 11/08/2019 CLINICAL DATA:  74 year old female with acute renal insufficiency. EXAM: RENAL / URINARY TRACT ULTRASOUND COMPLETE COMPARISON:  None. FINDINGS: Evaluation is very limited due to body habitus and inability of the patient to cooperate with exam. Right Kidney: Renal measurements: 13.4 x 7.3 x 8.5 cm = volume: 437 mL. There is  mild increased renal parenchymal echogenicity. There is moderate hydronephrosis. Inferior pole stone or clusters of adjacent stones measure up to 2.5 cm. Left Kidney: Renal measurements: 12.5 x 6.1 x 7.0 cm = volume: 278 mL. The left kidney is suboptimally visualized. There is mild increased echogenicity. There is moderate left hydronephrosis. No shadowing stone. Bladder: Not visualized. Other: None. IMPRESSION: 1. Increased renal parenchymal echogenicity in keeping with underlying kidney disease. 2. Moderate bilateral hydronephrosis. 3. Right renal calculi. Electronically Signed   By: Anner Crete M.D.   On: 11/08/2019 20:55   DG Chest Port 1  View  Result Date: 11/08/2019 CLINICAL DATA:  Severe anxiety.  Short of breath and weakness EXAM: PORTABLE CHEST 1 VIEW COMPARISON:  12/28/2018 FINDINGS: Moderate enlargement of the cardiopericardial silhouette. No mediastinal or hilar masses. Clear lungs.  No convincing pleural effusion and no pneumothorax. Skeletal structures are grossly intact. IMPRESSION: 1. No acute cardiopulmonary disease. 2. Stable moderate cardiomegaly. Electronically Signed   By: Lajean Manes M.D.   On: 11/08/2019 18:28   DG C-Arm 1-60 Min-No Report  Result Date: 11/09/2019 Fluoroscopy was utilized by the requesting physician.  No radiographic interpretation.   ECHOCARDIOGRAM COMPLETE  Result Date: 11/09/2019    ECHOCARDIOGRAM REPORT   Patient Name:   MAYDELLE KINDLER Date of Exam: 11/09/2019 Medical Rec #:  [id number]           Height:       63.0 in Accession #:    [id number]          Weight:       217.8 lb Date of Birth:  December 02, 1945            BSA:          2.005 m Patient Age:    61 years            BP:           132/78 mmHg Patient Gender: F                   HR:           53 bpm. Exam Location:  Inpatient Procedure: 2D Echo, Cardiac Doppler and Color Doppler Indications:    I50.33 Acute on chronic diastolic (congestive) heart failure  History:        Patient has no prior history of  Echocardiogram examinations.                 CHF, Abnormal ECG; Arrythmias:Atrial Fibrillation.  Sonographer:    Roseanna Rainbow RDCS Referring Phys: [id number] [facility]  Sonographer Comments: Patient is morbidly obese and Technically difficult study due to poor echo windows. Patient complained of pain form pressure in apical region. IMPRESSIONS  1. Left ventricular ejection fraction, by estimation, is 60 to 65%. The left ventricle has normal function. The left ventricle has no regional wall motion abnormalities. There is severe left ventricular hypertrophy. Left ventricular diastolic function could not be evaluated.  2. Right ventricular systolic function is normal. The right ventricular size is moderately enlarged. There is moderately elevated pulmonary artery systolic pressure. The estimated right ventricular systolic pressure is 99991111 mmHg.  3. Left atrial size was severely dilated.  4. Right atrial size was mildly dilated.  5. The mitral valve is normal in structure. Mild mitral valve regurgitation.  6. Tricuspid valve regurgitation is moderate.  7. The aortic valve is tricuspid. Aortic valve regurgitation is mild. No aortic stenosis is present.  8. Aortic dilatation noted. 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  9. The inferior vena cava is dilated in size with <50% respiratory variability, suggesting right atrial pressure of 15 mmHg. FINDINGS  Left Ventricle: Left ventricular ejection fraction, by estimation, is 60 to 65%. The left ventricle has normal function. The left ventricle has no regional wall motion abnormalities. The left ventricular internal cavity size was normal in size. There is  severe left ventricular hypertrophy. Left ventricular diastolic function could not be evaluated due to atrial fibrillation. Left ventricular diastolic function could not be evaluated. Right Ventricle: The right ventricular  size is moderately enlarged. No increase in right ventricular wall  thickness. Right ventricular systolic function is normal. There is moderately elevated pulmonary artery systolic pressure. The tricuspid regurgitant  velocity is 3.19 m/s, and with an assumed right atrial pressure of 15 mmHg, the estimated right ventricular systolic pressure is 99991111 mmHg. Left Atrium: Left atrial size was severely dilated. Right Atrium: Right atrial size was mildly dilated. Pericardium: Trivial pericardial effusion is present. Mitral Valve: The mitral valve is normal in structure. Moderate mitral annular calcification. Mild mitral valve regurgitation. MV peak gradient, 11.0 mmHg. The mean mitral valve gradient is 3.0 mmHg. Tricuspid Valve: The tricuspid valve is normal in structure. Tricuspid valve regurgitation is moderate. Aortic Valve: The aortic valve is tricuspid. . There is mild thickening of the aortic valve. Aortic valve regurgitation is mild. No aortic stenosis is present. Mild aortic valve annular calcification. There is mild thickening of the aortic valve. Aortic valve mean gradient measures 5.0 mmHg. Aortic valve peak gradient measures 11.4 mmHg. Aortic valve area, by VTI measures 1.77 cm. Pulmonic Valve: The pulmonic valve was normal in structure. Pulmonic valve regurgitation is not visualized. No evidence of pulmonic stenosis. Aorta: Aortic dilatation noted. 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 Venous: The inferior vena cava is dilated in size with less than 50% respiratory variability, suggesting right atrial pressure of 15 mmHg. IAS/Shunts: The atrial septum is grossly normal.  LEFT VENTRICLE PLAX 2D LVIDd:         3.72 cm     Diastology LVIDs:         1.83 cm     LV e' lateral:   6.58 cm/s LV PW:         1.82 cm     LV E/e' lateral: 23.3 LV IVS:        2.09 cm     LV e' medial:    5.06 cm/s LVOT diam:     1.80 cm     LV E/e' medial:  30.3 LV SV:         65 LV SV Index:   32 LVOT Area:     2.54 cm  LV Volumes (MOD) LV vol d, MOD A2C: 45.7 ml LV vol  d, MOD A4C: 60.5 ml LV vol s, MOD A2C: 19.7 ml LV vol s, MOD A4C: 15.8 ml LV SV MOD A2C:     26.0 ml LV SV MOD A4C:     60.5 ml LV SV MOD BP:      37.7 ml RIGHT VENTRICLE            IVC RV S prime:     7.97 cm/s  IVC diam: 3.03 cm TAPSE (M-mode): 1.9 cm LEFT ATRIUM              Index       RIGHT ATRIUM           Index LA diam:        5.10 cm  2.54 cm/m  RA Area:     23.90 cm LA Vol (A2C):   64.4 ml  32.12 ml/m RA Volume:   78.80 ml  39.30 ml/m LA Vol (A4C):   164.0 ml 81.79 ml/m LA Biplane Vol: 106.0 ml 52.87 ml/m  AORTIC VALVE AV Area (Vmax):    1.64 cm AV Area (Vmean):   1.72 cm AV Area (VTI):     1.77 cm AV Vmax:  169.00 cm/s AV Vmean:          105.000 cm/s AV VTI:            0.369 m AV Peak Grad:      11.4 mmHg AV Mean Grad:      5.0 mmHg LVOT Vmax:         109.00 cm/s LVOT Vmean:        70.900 cm/s LVOT VTI:          0.256 m LVOT/AV VTI ratio: 0.69  AORTA Ao Root diam: 3.60 cm Ao Asc diam:  4.00 cm MITRAL VALVE                TRICUSPID VALVE MV Area (PHT): 3.32 cm     TR Peak grad:   40.7 mmHg MV Peak grad:  11.0 mmHg    TR Vmax:        319.00 cm/s MV Mean grad:  3.0 mmHg MV Vmax:       1.66 m/s     SHUNTS MV Vmean:      72.2 cm/s    Systemic VTI:  0.26 m MV Decel Time: 229 msec     Systemic Diam: 1.80 cm MV E velocity: 153.50 cm/s MV A velocity: 41.60 cm/s MV E/A ratio:  3.69 Mertie Moores MD Electronically signed by Mertie Moores MD Signature Date/Time: 11/09/2019/2:16:33 PM    Final    CT Renal Stone Study  Result Date: 11/08/2019 CLINICAL DATA:  Flank pain EXAM: CT ABDOMEN AND PELVIS WITHOUT CONTRAST TECHNIQUE: Multidetector CT imaging of the abdomen and pelvis was performed following the standard protocol without IV contrast. COMPARISON:  CT 07/03/2015, ultrasound 11/08/2019 FINDINGS: Lower chest: Moderate right pleural effusion.  Cardiomegaly. Hepatobiliary: Status post cholecystectomy. No focal hepatic abnormality or biliary dilatation. Pancreas: Unremarkable. No pancreatic ductal  dilatation or surrounding inflammatory changes. Spleen: Normal in size without focal abnormality. Adrenals/Urinary Tract: 2.6 cm right adrenal adenoma. 2.3 cm left adrenal adenoma. These are unchanged. Moderate to marked right hydronephrosis and moderate left hydronephrosis. Numerous intrarenal stones on the right. Twelve by 16 mm stone in the left renal pelvis. Sixteen by 15 mm stone in the right renal pelvis. Multiple additional stones in the lower pole of the right kidney. Considerable right perinephric stranding. No distal ureteral stones. Urinary bladder is unremarkable Stomach/Bowel: The stomach is nonenlarged. No dilated small bowel. No bowel wall thickening. Sigmoid colon diverticula without acute inflammatory change Vascular/Lymphatic: Moderate aortic atherosclerosis without aneurysm. No suspicious adenopathy Reproductive: Uterus unremarkable. Stable ovoid calcification in the left adnexa Other: No free air. Small pelvic fluid. Left paramidline ventral hernia containing mesenteric fat. Generalized subcutaneous edema consistent with anasarca. Musculoskeletal: No acute or suspicious osseous abnormality IMPRESSION: 1. Moderate severe right hydronephrosis and moderate left hydronephrosis. Large stones within the bilateral renal pelvises measuring 16 mm on the right and 16 mm on the left, contributing to bilateral hydronephrosis. No ureteral stones. Multiple additional intrarenal stones on the right 2. Stable bilateral adrenal gland adenomas. 3. Left abdominal ventral hernia containing fat 4. Sigmoid colon diverticular disease without acute inflammatory change 5. Cardiomegaly.  Moderate right pleural effusion Electronically Signed   By: Donavan Foil M.D.   On: 11/08/2019 22:40      Flora Lipps, MD  [facility] 11/10/2019

## 2019-11-10 NOTE — Progress Notes (Signed)
Urology Inpatient Progress Report  Bilateral hydronephrosis [N13.30] Acute renal failure (ARF) (HCC) [N17.9] Acute renal failure, unspecified acute renal failure type ([place]) [N17.9] Bilateral renal stones [N20.0]  Procedure(s): CYSTOSCOPY BILATERAL URETERAL STENT PLACEMENT  1 Day Post-Op   Intv/Subj: No acute events overnight. Patient is without complaint. Patient feels significantly better this morning. Kidney function has improved.  Principal Problem:   Acute renal failure (ARF) (HCC) Active Problems:   Obstructive uropathy   Hyperkalemia   Metabolic acidosis   Hyperphosphatemia  Current Facility-Administered Medications  Medication Dose Route Frequency Provider Last Rate Last Admin  . 0.9 %  sodium chloride infusion  250 mL Intravenous PRN Shela Leff, MD      . acetaminophen (TYLENOL) tablet 650 mg  650 mg Oral Q4H PRN Shela Leff, MD      . aspirin tablet 325 mg  325 mg Oral Daily Shela Leff, MD   325 mg at 11/10/19 0907  . carvedilol (COREG) tablet 6.25 mg  6.25 mg Oral BID Shela Leff, MD   6.25 mg at 11/10/19 0907  . Chlorhexidine Gluconate Cloth 2 % PADS 6 each  6 each Topical Daily Pokhrel, Laxman, MD   6 each at 11/10/19 0907  . fentaNYL (SUBLIMAZE) injection 12.5 mcg  12.5 mcg Intravenous Q2H PRN Shela Leff, MD      . hydrALAZINE (APRESOLINE) injection 5 mg  5 mg Intravenous Q4H PRN Shela Leff, MD      . sodium chloride flush (NS) 0.9 % injection 3 mL  3 mL Intravenous Q12H Shela Leff, MD   3 mL at 11/10/19 0908  . sodium chloride flush (NS) 0.9 % injection 3 mL  3 mL Intravenous PRN Shela Leff, MD         Objective: Vital: Vitals:   11/09/19 2114 11/10/19 0107 11/10/19 0502 11/10/19 0854  BP: (!) 125/56 (!) 136/109 (!) 141/68 (!) 134/59  Pulse: (!) 56 60 (!) 52 (!) 58  Resp: 16 16 16    Temp: 97.7 F (36.5 C) 97.6 F (36.4 C) 97.6 F (36.4 C)   TempSrc: Oral Oral Oral   SpO2: 94% 92% 94% 93%   Weight:      Height:       I/Os: I/O last 3 completed shifts: In: 59 [P.O.:360; I.V.:500] Out: 2700 [Urine:2700]  Physical Exam:  General: Patient is in no apparent distress Lungs: Normal respiratory effort, chest expands symmetrically. GI: Incisions are c/d/i.  The abdomen is soft and nontender without mass. Foley: Clear yellow urine Ext: lower extremities symmetric  Lab Results: Recent Labs    11/08/19 1722 11/10/19 0455  WBC 9.0 6.6  HGB 12.4 11.5*  HCT 40.0 38.0   Recent Labs    11/08/19 1722 11/08/19 1722 11/09/19 0000 11/09/19 0431 11/10/19 0455  NA 141  --   --  139 145  K 5.6*   < > 5.4* 5.3* 5.4*  CL 109  --   --  109 115*  CO2 21*  --   --  19* 22  GLUCOSE 101*  --   --  84 128*  BUN 98*  --   --  97* 79*  CREATININE 5.23*  --   --  5.24* 3.53*  CALCIUM 8.7*  --   --  8.3* 8.5*   < > = values in this interval not displayed.   No results for input(s): LABPT, INR in the last 72 hours. No results for input(s): LABURIN in the last 72 hours. Results for orders placed or  performed during the hospital encounter of 11/08/19  Respiratory Panel by RT PCR (Flu A&B, Covid) - Nasopharyngeal Swab     Status: None   Collection Time: 11/08/19 10:48 PM   Specimen: Nasopharyngeal Swab  Result Value Ref Range Status   SARS Coronavirus 2 by RT PCR NEGATIVE NEGATIVE Final    Comment: (NOTE) SARS-CoV-2 target nucleic acids are NOT DETECTED. The SARS-CoV-2 RNA is generally detectable in upper respiratoy specimens during the acute phase of infection. The lowest concentration of SARS-CoV-2 viral copies this assay can detect is 131 copies/mL. A negative result does not preclude SARS-Cov-2 infection and should not be used as the sole basis for treatment or other patient management decisions. A negative result may occur with  improper specimen collection/handling, submission of specimen other than nasopharyngeal swab, presence of viral mutation(s) within the areas targeted  by this assay, and inadequate number of viral copies (<131 copies/mL). A negative result must be combined with clinical observations, patient history, and epidemiological information. The expected result is Negative. Fact Sheet for Patients:  PinkCheek.be Fact Sheet for Healthcare Providers:  GravelBags.it This test is not yet ap proved or cleared by the [place] FDA and  has been authorized for detection and/or diagnosis of SARS-CoV-2 by FDA under an Emergency Use Authorization (EUA). This EUA will remain  in effect (meaning this test can be used) for the duration of the COVID-19 declaration under Section 564(b)(1) of the Act, 21 U.S.C. section 360bbb-3(b)(1), unless the authorization is terminated or revoked sooner.    Influenza A by PCR NEGATIVE NEGATIVE Final   Influenza B by PCR NEGATIVE NEGATIVE Final    Comment: (NOTE) The Xpert Xpress SARS-CoV-2/FLU/RSV assay is intended as an aid in  the diagnosis of influenza from Nasopharyngeal swab specimens and  should not be used as a sole basis for treatment. Nasal washings and  aspirates are unacceptable for Xpert Xpress SARS-CoV-2/FLU/RSV  testing. Fact Sheet for Patients: PinkCheek.be Fact Sheet for Healthcare Providers: GravelBags.it This test is not yet approved or cleared by the [place] FDA and  has been authorized for detection and/or diagnosis of SARS-CoV-2 by  FDA under an Emergency Use Authorization (EUA). This EUA will remain  in effect (meaning this test can be used) for the duration of the  Covid-19 declaration under Section 564(b)(1) of the Act, 21  U.S.C. section 360bbb-3(b)(1), unless the authorization is  terminated or revoked. Performed at [facility], [place] [address], [place], [place] [address]   Anaerobic culture     Status: None (Preliminary result)   Collection Time:  11/09/19 11:08 AM   Specimen: Urine, Cystoscope; Urinary  Result Value Ref Range Status   Specimen Description   Final    CYSTOSCOPY Performed at [facility] [address], [place], [place] [address]    Special Requests   Final    STENT Performed at [facility], [place] [address] [place], [place] [address]    Gram Stain   Final    CYTOSPIN SMEAR WBC PRESENT, PREDOMINANTLY PMN NO ORGANISMS SEEN Performed at [facility], [place] [address], [place], [place] [address]    Culture PENDING  Incomplete   Report Status PENDING  Incomplete    Studies/Results: CT CHEST WO CONTRAST  Result Date: 11/09/2019 CLINICAL DATA:  74 year old female with pleural effusion seen on the x-ray. EXAM: CT CHEST WITHOUT CONTRAST TECHNIQUE: Multidetector CT imaging of the chest was performed following the standard protocol without IV contrast. COMPARISON:  Chest radiograph dated 11/08/2019. FINDINGS: Evaluation of this exam is limited in the absence of intravenous contrast. Cardiovascular: There is moderate cardiomegaly. Advanced coronary vascular calcification of the LAD as well as calcification of the mitral annulus. There is mild atherosclerotic calcification of the thoracic aorta. There is enlargement of the main pulmonary trunk suggestive of a degree of pulmonary hypertension. Clinical correlation is recommended. Mediastinum/Nodes: There is no hilar or mediastinal adenopathy. The esophagus is grossly unremarkable. No mediastinal fluid collection. Lungs/Pleura: There is a moderate right pleural effusion with partial compressive atelectasis of the right lower lobe. Pneumonia is not excluded clinical correlation is recommended. Bibasilar linear and bandlike atelectasis/scarring. There is no pneumothorax. The central airways are patent. Upper Abdomen: Partially visualized air within the right kidney may be related to recent instrumentation or infection. Clinical correlation is  recommended. There is an indeterminate 2 cm right adrenal nodule. Musculoskeletal: Osteopenia with degenerative changes of the spine. No acute osseous pathology. IMPRESSION: 1. Moderate right pleural effusion with partial compressive atelectasis of the right lower lobe. Pneumonia is not excluded. Clinical correlation is recommended. 2. Moderate cardiomegaly with advanced coronary vascular calcification of the LAD. 3. Partially visualized air within the right kidney may be related to recent instrumentation or infection. Clinical correlation is recommended. 4. Aortic Atherosclerosis (ICD10-I70.0). Electronically Signed   By: Anner Crete M.D.   On: 11/09/2019 20:37   US Renal  Result Date: 11/08/2019 CLINICAL DATA:  74 year old female with acute renal insufficiency. EXAM: RENAL / URINARY TRACT ULTRASOUND COMPLETE COMPARISON:  None. FINDINGS: Evaluation is very limited due to body habitus and inability of the patient to cooperate with exam. Right Kidney: Renal measurements: 13.4 x 7.3 x 8.5 cm = volume: 437 mL. There is mild increased renal parenchymal echogenicity. There is moderate hydronephrosis. Inferior pole stone or clusters of adjacent stones measure up to 2.5 cm. Left Kidney: Renal measurements: 12.5 x 6.1 x 7.0 cm = volume: 278 mL. The left kidney is suboptimally visualized. There is mild increased echogenicity. There is moderate left hydronephrosis. No shadowing stone. Bladder: Not visualized. Other: None. IMPRESSION: 1. Increased renal parenchymal echogenicity in keeping with underlying kidney disease. 2. Moderate bilateral hydronephrosis. 3. Right renal calculi. Electronically Signed   By: Anner Crete M.D.   On: 11/08/2019 20:55   DG Chest Port 1 View  Result Date: 11/08/2019 CLINICAL DATA:  Severe anxiety.  Short of breath and weakness EXAM: PORTABLE CHEST 1 VIEW COMPARISON:  12/28/2018 FINDINGS: Moderate enlargement of the cardiopericardial silhouette. No mediastinal or hilar masses. Clear  lungs.  No convincing pleural effusion and no pneumothorax. Skeletal structures are grossly intact. IMPRESSION: 1. No acute cardiopulmonary disease. 2. Stable moderate cardiomegaly. Electronically Signed   By: Lajean Manes M.D.   On: 11/08/2019 18:28   DG C-Arm 1-60 Min-No Report  Result Date: 11/09/2019 Fluoroscopy was utilized by the requesting physician.  No radiographic interpretation.   ECHOCARDIOGRAM COMPLETE  Result Date: 11/09/2019    ECHOCARDIOGRAM REPORT   Patient Name:   Gloria [name] Date of Exam: 11/09/2019 Medical Rec #:  [id number]           Height:       63.0 in Accession #:    [id number]          Weight:       217.8 lb Date of Birth:  [birth year]            BSA:          2.005 m Patient Age:  73 years            BP:           132/78 mmHg Patient Gender: F                   HR:           53 bpm. Exam Location:  Inpatient Procedure: 2D Echo, Cardiac Doppler and Color Doppler Indications:    I50.33 Acute on chronic diastolic (congestive) heart failure  History:        Patient has no prior history of Echocardiogram examinations.                 CHF, Abnormal ECG; Arrythmias:Atrial Fibrillation.  Sonographer:    Roseanna Rainbow RDCS Referring Phys: [id number] [facility]  Sonographer Comments: Patient is morbidly obese and Technically difficult study due to poor echo windows. Patient complained of pain form pressure in apical region. IMPRESSIONS  1. Left ventricular ejection fraction, by estimation, is 60 to 65%. The left ventricle has normal function. The left ventricle has no regional wall motion abnormalities. There is severe left ventricular hypertrophy. Left ventricular diastolic function could not be evaluated.  2. Right ventricular systolic function is normal. The right ventricular size is moderately enlarged. There is moderately elevated pulmonary artery systolic pressure. The estimated right ventricular systolic pressure is [id number] mmHg.  3. Left atrial size was severely dilated.  4. Right  atrial size was mildly dilated.  5. The mitral valve is normal in structure. Mild mitral valve regurgitation.  6. Tricuspid valve regurgitation is moderate.  7. The aortic valve is tricuspid. Aortic valve regurgitation is mild. No aortic stenosis is present.  8. Aortic dilatation noted. 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  9. The inferior vena cava is dilated in size with <50% respiratory variability, suggesting right atrial pressure of 15 mmHg. FINDINGS  Left Ventricle: Left ventricular ejection fraction, by estimation, is 60 to 65%. The left ventricle has normal function. The left ventricle has no regional wall motion abnormalities. The left ventricular internal cavity size was normal in size. There is  severe left ventricular hypertrophy. Left ventricular diastolic function could not be evaluated due to atrial fibrillation. Left ventricular diastolic function could not be evaluated. Right Ventricle: The right ventricular size is moderately enlarged. No increase in right ventricular wall thickness. Right ventricular systolic function is normal. There is moderately elevated pulmonary artery systolic pressure. The tricuspid regurgitant  velocity is 3.19 m/s, and with an assumed right atrial pressure of 15 mmHg, the estimated right ventricular systolic pressure is 99991111 mmHg. Left Atrium: Left atrial size was severely dilated. Right Atrium: Right atrial size was mildly dilated. Pericardium: Trivial pericardial effusion is present. Mitral Valve: The mitral valve is normal in structure. Moderate mitral annular calcification. Mild mitral valve regurgitation. MV peak gradient, 11.0 mmHg. The mean mitral valve gradient is 3.0 mmHg. Tricuspid Valve: The tricuspid valve is normal in structure. Tricuspid valve regurgitation is moderate. Aortic Valve: The aortic valve is tricuspid. . There is mild thickening of the aortic valve. Aortic valve regurgitation is mild. No aortic stenosis is present.  Mild aortic valve annular calcification. There is mild thickening of the aortic valve. Aortic valve mean gradient measures 5.0 mmHg. Aortic valve peak gradient measures 11.4 mmHg. Aortic valve area, by VTI measures 1.77 cm. Pulmonic Valve: The pulmonic valve was normal in structure. Pulmonic valve regurgitation is not visualized. No evidence of pulmonic stenosis. Aorta: Aortic dilatation noted.  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 Venous: The inferior vena cava is dilated in size with less than 50% respiratory variability, suggesting right atrial pressure of 15 mmHg. IAS/Shunts: The atrial septum is grossly normal.  LEFT VENTRICLE PLAX 2D LVIDd:         3.72 cm     Diastology LVIDs:         1.83 cm     LV e' lateral:   6.58 cm/s LV PW:         1.82 cm     LV E/e' lateral: 23.3 LV IVS:        2.09 cm     LV e' medial:    5.06 cm/s LVOT diam:     1.80 cm     LV E/e' medial:  30.3 LV SV:         65 LV SV Index:   32 LVOT Area:     2.54 cm  LV Volumes (MOD) LV vol d, MOD A2C: 45.7 ml LV vol d, MOD A4C: 60.5 ml LV vol s, MOD A2C: 19.7 ml LV vol s, MOD A4C: 15.8 ml LV SV MOD A2C:     26.0 ml LV SV MOD A4C:     60.5 ml LV SV MOD BP:      37.7 ml RIGHT VENTRICLE            IVC RV S prime:     7.97 cm/s  IVC diam: 3.03 cm TAPSE (M-mode): 1.9 cm LEFT ATRIUM              Index       RIGHT ATRIUM           Index LA diam:        5.10 cm  2.54 cm/m  RA Area:     23.90 cm LA Vol (A2C):   64.4 ml  32.12 ml/m RA Volume:   78.80 ml  39.30 ml/m LA Vol (A4C):   164.0 ml 81.79 ml/m LA Biplane Vol: 106.0 ml 52.87 ml/m  AORTIC VALVE AV Area (Vmax):    1.64 cm AV Area (Vmean):   1.72 cm AV Area (VTI):     1.77 cm AV Vmax:           169.00 cm/s AV Vmean:          105.000 cm/s AV VTI:            0.369 m AV Peak Grad:      11.4 mmHg AV Mean Grad:      5.0 mmHg LVOT Vmax:         109.00 cm/s LVOT Vmean:        70.900 cm/s LVOT VTI:          0.256 m LVOT/AV VTI ratio: 0.69  AORTA Ao Root diam: 3.60 cm  Ao Asc diam:  4.00 cm MITRAL VALVE                TRICUSPID VALVE MV Area (PHT): 3.32 cm     TR Peak grad:   40.7 mmHg MV Peak grad:  11.0 mmHg    TR Vmax:        319.00 cm/s MV Mean grad:  3.0 mmHg MV Vmax:       1.66 m/s     SHUNTS MV Vmean:      72.2 cm/s    Systemic VTI:  0.26 m MV Decel Time: 229 msec  Systemic Diam: 1.80 cm MV E velocity: 153.50 cm/s MV A velocity: 41.60 cm/s MV E/A ratio:  3.69 Mertie Moores MD Electronically signed by Mertie Moores MD Signature Date/Time: 11/09/2019/2:16:33 PM    Final    CT Renal Stone Study  Result Date: 11/08/2019 CLINICAL DATA:  Flank pain EXAM: CT ABDOMEN AND PELVIS WITHOUT CONTRAST TECHNIQUE: Multidetector CT imaging of the abdomen and pelvis was performed following the standard protocol without IV contrast. COMPARISON:  CT 07/03/2015, ultrasound 11/08/2019 FINDINGS: Lower chest: Moderate right pleural effusion.  Cardiomegaly. Hepatobiliary: Status post cholecystectomy. No focal hepatic abnormality or biliary dilatation. Pancreas: Unremarkable. No pancreatic ductal dilatation or surrounding inflammatory changes. Spleen: Normal in size without focal abnormality. Adrenals/Urinary Tract: 2.6 cm right adrenal adenoma. 2.3 cm left adrenal adenoma. These are unchanged. Moderate to marked right hydronephrosis and moderate left hydronephrosis. Numerous intrarenal stones on the right. Twelve by 16 mm stone in the left renal pelvis. Sixteen by 15 mm stone in the right renal pelvis. Multiple additional stones in the lower pole of the right kidney. Considerable right perinephric stranding. No distal ureteral stones. Urinary bladder is unremarkable Stomach/Bowel: The stomach is nonenlarged. No dilated small bowel. No bowel wall thickening. Sigmoid colon diverticula without acute inflammatory change Vascular/Lymphatic: Moderate aortic atherosclerosis without aneurysm. No suspicious adenopathy Reproductive: Uterus unremarkable. Stable ovoid calcification in the left adnexa  Other: No free air. Small pelvic fluid. Left paramidline ventral hernia containing mesenteric fat. Generalized subcutaneous edema consistent with anasarca. Musculoskeletal: No acute or suspicious osseous abnormality IMPRESSION: 1. Moderate severe right hydronephrosis and moderate left hydronephrosis. Large stones within the bilateral renal pelvises measuring 16 mm on the right and 16 mm on the left, contributing to bilateral hydronephrosis. No ureteral stones. Multiple additional intrarenal stones on the right 2. Stable bilateral adrenal gland adenomas. 3. Left abdominal ventral hernia containing fat 4. Sigmoid colon diverticular disease without acute inflammatory change 5. Cardiomegaly.  Moderate right pleural effusion Electronically Signed   By: Donavan Foil M.D.   On: 11/08/2019 22:40    Assessment: Procedure(s): CYSTOSCOPY BILATERAL URETERAL STENT PLACEMENT, 1 Day Post-Op  doing well.  Renal function has improved quite significantly.  Her pain is also getting better.  She has no signs or symptoms of infection today.  Plan: DC Foley catheter.  Start Myrbetriq for overactive bladder with urinary urge incontinence.  I have sent a prescription home with the patient for this.  Does not appear that she will require any pain medicine going home. Plan for last 2 schedule her for removal of her kidney stones approximately 2 weeks.  I communicated the plan as above both with the patient and her brother over the phone.  I will sign off on this patient, please contact me for any additional questions or concerns.   Louis Meckel, MD Urology 11/10/2019, 9:09 AM

## 2019-11-11 ENCOUNTER — Other Ambulatory Visit: Payer: Self-pay | Admitting: Urology

## 2019-11-11 LAB — COMPREHENSIVE METABOLIC PANEL
ALT: 14 U/L (ref 0–44)
AST: 13 U/L — ABNORMAL LOW (ref 15–41)
Albumin: 2.8 g/dL — ABNORMAL LOW (ref 3.5–5.0)
Alkaline Phosphatase: 54 U/L (ref 38–126)
Anion gap: 9 (ref 5–15)
BUN: 61 mg/dL — ABNORMAL HIGH (ref 8–23)
CO2: 23 mmol/L (ref 22–32)
Calcium: 8.3 mg/dL — ABNORMAL LOW (ref 8.9–10.3)
Chloride: 112 mmol/L — ABNORMAL HIGH (ref 98–111)
Creatinine, Ser: 2.43 mg/dL — ABNORMAL HIGH (ref 0.44–1.00)
GFR calc Af Amer: 22 mL/min — ABNORMAL LOW (ref >60)
GFR calc non Af Amer: 19 mL/min — ABNORMAL LOW (ref >60)
Glucose, Bld: 88 mg/dL (ref 70–99)
Potassium: 4.4 mmol/L (ref 3.5–5.1)
Sodium: 144 mmol/L (ref 135–145)
Total Bilirubin: 0.3 mg/dL (ref 0.3–1.2)
Total Protein: 6.2 g/dL — ABNORMAL LOW (ref 6.5–8.1)

## 2019-11-11 LAB — MAGNESIUM: Magnesium: 1.8 mg/dL (ref 1.7–2.4)

## 2019-11-11 LAB — CBC
HCT: 41.1 % (ref 36.0–46.0)
Hemoglobin: 12.4 g/dL (ref 12.0–15.0)
MCH: 31.8 pg (ref 26.0–34.0)
MCHC: 30.2 g/dL (ref 30.0–36.0)
MCV: 105.4 fL — ABNORMAL HIGH (ref 80.0–100.0)
Platelets: 277 10*3/uL (ref 150–400)
RBC: 3.9 MIL/uL (ref 3.87–5.11)
RDW: 13.3 % (ref 11.5–15.5)
WBC: 8 10*3/uL (ref 4.0–10.5)
nRBC: 0 % (ref 0.0–0.2)

## 2019-11-11 LAB — PHOSPHORUS: Phosphorus: 3.6 mg/dL (ref 2.5–4.6)

## 2019-11-11 NOTE — Evaluation (Signed)
Physical Therapy Evaluation Patient Details Name: Gloria [name] MRN: [id number] DOB: [birth year] Today's Date: 11/11/2019   History of Present Illness  74 yo female admitted with AKI, uropathy. S/P ureteral stent placement 5/8  Clinical Impression  On eval, pt required Min assist for mobility. She walked ~12 feet x 2 (to and from bathroom) using 1 quad cane + 1 straight cane. She adamantly refused RW use during session. O2 88% on RA during session. Dyspnea 2/4. Pt was fatigued after activity. Assisted back to bed. Pt stated she plans to return home at discharge. Recommend HHPT and 24 hour supervision/assist at this time. Will follow and progress activity as tolerated.     Follow Up Recommendations Home health PT;Supervision/Assistance - 24 hour    Equipment Recommendations  None recommended by PT    Recommendations for Other Services       Precautions / Restrictions Precautions Precautions: Fall Precaution Comments: monitor O2 Restrictions Weight Bearing Restrictions: No      Mobility  Bed Mobility Overal bed mobility: Needs Assistance Bed Mobility: Supine to Sit;Sit to Supine     Supine to sit: Min guard;HOB elevated Sit to supine: Min assist;HOB elevated   General bed mobility comments: Small amount of assist for LEs onto bed.  Transfers Overall transfer level: Needs assistance Equipment used: Quad cane;Straight cane Transfers: Sit to/from Stand Sit to Stand: Min assist         General transfer comment: Pt refused RW use. Prefers to use 1 straight cane, 1 quad cane. Assist to power up, steady, control descent.  Ambulation/Gait Ambulation/Gait assistance: Min assist Gait Distance (Feet): 12 Feet(x2) Assistive device: Straight cane;Quad cane Gait Pattern/deviations: Step-through pattern     General Gait Details: Slow gait speed. Assist to steady throughout distance. O2 88% on RA. Dyspnea 2/4. Walked to and from bathroom.  Stairs             Wheelchair Mobility    Modified Rankin (Stroke Patients Only)       Balance Overall balance assessment: Needs assistance         Standing balance support: Bilateral upper extremity supported Standing balance-Leahy Scale: Fair                               Pertinent Vitals/Pain Pain Assessment: Faces Faces Pain Scale: No hurt    Home Living Family/patient expects to be discharged to:: Private residence Living Arrangements: Alone Available Help at Discharge: Family;Available PRN/intermittently(brother lives next door)           Home Equipment: Kasandra Knudsen - quad;Cane - single point      Prior Function Level of Independence: Independent with assistive device(s)         Comments: walks with a cane     Hand Dominance        Extremity/Trunk Assessment   Upper Extremity Assessment Upper Extremity Assessment: Defer to OT evaluation    Lower Extremity Assessment Lower Extremity Assessment: Generalized weakness    Cervical / Trunk Assessment Cervical / Trunk Assessment: Kyphotic  Communication   Communication: HOH  Cognition Arousal/Alertness: Awake/alert Behavior During Therapy: WFL for tasks assessed/performed Overall Cognitive Status: Difficult to assess                                 General Comments: appears [facility]      General Comments  Exercises     Assessment/Plan    PT Assessment Patient needs continued PT services  PT Problem List Decreased strength;Decreased mobility;Decreased balance;Decreased activity tolerance;Decreased knowledge of use of DME;Obesity       PT Treatment Interventions DME instruction;Gait training;Therapeutic activities;Therapeutic exercise;Patient/family education;Balance training;Functional mobility training    PT Goals (Current goals can be found in the Care Plan section)  Acute Rehab PT Goals Patient Stated Goal: home. PT Goal Formulation: With patient Time For Goal Achievement:  11/25/19 Potential to Achieve Goals: Good    Frequency Min 3X/week   Barriers to discharge        Co-evaluation               AM-PAC PT "6 Clicks" Mobility  Outcome Measure Help needed turning from your back to your side while in a flat bed without using bedrails?: A Little Help needed moving from lying on your back to sitting on the side of a flat bed without using bedrails?: A Little Help needed moving to and from a bed to a chair (including a wheelchair)?: A Little Help needed standing up from a chair using your arms (e.g., wheelchair or bedside chair)?: A Little Help needed to walk in hospital room?: A Little Help needed climbing 3-5 steps with a railing? : A Lot 6 Click Score: 17    End of Session Equipment Utilized During Treatment: Gait belt Activity Tolerance: Patient limited by fatigue Patient left: in bed;with call bell/phone within reach;with bed alarm set   PT Visit Diagnosis: Muscle weakness (generalized) (M62.81);Difficulty in walking, not elsewhere classified (R26.2)    Time: [id number] PT Time Calculation (min) (ACUTE ONLY): 26 min   Charges:   PT Evaluation $PT Eval Low Complexity: 1 Low PT Treatments $Gait Training: 8-22 mins           Doreatha Massed, PT Acute Rehabilitation

## 2019-11-11 NOTE — Progress Notes (Addendum)
PROGRESS NOTE  Gloria [name] [id number] DOB: 20-Mar-1946 DOA: 11/08/2019 PCP: Maryella Shivers, MD   LOS: 3 days   Brief narrative: As per HPI,  Gloria [name] is a 74 y.o. female with past medical history significant of atrial fibrillation, hypertension was sent to the emergency department for abnormal labs.  Patient was a hard of hearing but  reads lips.  Information was obtained from the patient's brother who stated that the patient had gone to her primary care physician for elevated blood pressure and had elevated creatinine of 7.0.  Repeat labs were done which showed a creatinine of 5.  Patient was also complaining of groin pain shortness of breath.  History of chronic leg swelling.  Patient does live by herself at home.  ED Course:  Patient did not have any fever.  Labs showed no leukocytosis.  BUN 98, creatinine 5.2. GFR 8. Potassium 5.6. Bicarb 21, anion gap 11. BNP 345.  Phosphorus 6.6. UA with evidence of microscopic hematuria but no significant pyuria or bacteriuria to suggest infection. SARS-CoV-2 PCR test pending. Chest x-ray showing stable moderate cardiomegaly and no acute cardiopulmonary disease. Renal ultrasound showing increased renal parenchymal echogenicity, right renal calculi, and moderate bilateral hydronephrosis. CT renal stone study showing moderate to severe right hydronephrosis and moderate left hydronephrosis. Large stones within the bilateral renal pelvises measuring 16 mm in the right and 16 mm on the left, contributing to bilateral hydronephrosis. No ureteral stones. Multiple additional intrarenal stones on the right.  CT renal stone study also showing cardiomegaly and moderate right pleural effusion.  Assessment/Plan:  Principal Problem:   Acute renal failure (ARF) (HCC) Active Problems:   Obstructive uropathy   Hyperkalemia   Metabolic acidosis   Hyperphosphatemia  Acute kidney injury secondary to obstructive uropathy:  BUN 98, creatinine 5.2. GFR  8 on presentation.  CT showing large stones within the bilateral renal pelvises contributing to bilateral hydronephrosis (moderate to severe in the right and moderate on the left). Multiple additional intrarenal stones on the right.  Status post bilateral retrograde pyelography with bilateral ureteral stent placement on 11/09/2019.  Improvement in renal function .  Creatinine has gone down to 2.4 from 3.5.  on Myrbetriq by urology. Patient will need to follow-up with urology as outpatient for nephrolithiasis.  Mild hyperkalemia: Resolved at this time.  Received Lokelma x1.  Mild normal anion gap metabolic acidosis: Resolved  Hyperphosphatemia:  Likely secondary to acute kidney injury,phosphorus 6.6 presentation.  Improved to 3.6 today.  Volume overload: Improving.  Chest x-ray with moderate cardiomegaly.  CT scan with moderate pleural effusion on presentation..   2D echocardiogram from 11/09/2019 shows LV ejection fraction of 60 to 65% with severe LVH and elevated pulmonary artery pressure at 55.Marland Kitchen  No dyspnea and appears to be compensated.  Atrial fibrillation: Rate controlled at this time.  Continue aspirin Coreg.  Not on anticoagulation due to high risk for falls   Essential hypertension:  Continue Coreg, hydralazine as needed for SBP >170.   Debility, deconditioning.  Patient uses 2 canes at home for ambulation.  Will get PT evaluation.  Might need home PT on discharge.  VTE Prophylaxis: Sequential compression device  Code Status: Full code  Family Communication: Spoke with the patient's brother on the phone and updated him about the clinical condition of the patient and the plan for disposition home in 1 to 2 days.  Status is: Inpatient  Remains inpatient appropriate because: Inpatient level of care appropriate due to severity of illness, status  post urological intervention, significant acute kidney injury, assess for ambulation- consult PT  Dispo: The patient is from: Home               Anticipated d/c is to: Home              Anticipated d/c date is: 1-2 days              Patient currently is not medically stable to d/c.  Consultants:  Urology  Procedures:  Retrograde ureteroscopy with bilateral ureteric stent placement on 11/19/2019 by urology.  Antibiotics:  . Prophylactic antibiotic with ciprofloxacin  Anti-infectives (From admission, onward)   Start     Dose/Rate Route Frequency Ordered Stop   11/09/19 1115  ciprofloxacin (CIPRO) IVPB 400 mg     400 mg 200 mL/hr over 60 Minutes Intravenous  Once 11/09/19 1029 11/09/19 1038   11/09/19 1029  ciprofloxacin (CIPRO) 400 MG/200ML IVPB    Note to Pharmacy: Marquis Buggy   : cabinet override      11/09/19 1029 11/09/19 1047     Subjective: Today, patient was seen and examined at bedside.  States that she was incontinent.  Denies overt pain.  Feels very anxious.  No fever, chills or rigor.  Objective: Vitals:   11/11/19 0437 11/11/19 0933  BP: 134/77 (!) 158/67  Pulse: (!) 50 61  Resp: 18 20  Temp: 97.8 F (36.6 C) 98 F (36.7 C)  SpO2: 93% 92%    Intake/Output Summary (Last 24 hours) at 11/11/2019 1102 Last data filed at 11/11/2019 1000 Gross per 24 hour  Intake 680 ml  Output 2150 ml  Net -1470 ml   Filed Weights   11/08/19 1617 11/09/19 0500 11/11/19 0458  Weight: 102.1 kg 98.8 kg 99.1 kg   Body mass index is 38.7 kg/m.   Physical Exam: GENERAL: Patient is alert awake and oriented. Not in obvious distress.  Morbidly obese,  hard of hearing but able to read lips. HENT: No scleral pallor or icterus. Pupils equally reactive to light. Oral mucosa is moist NECK: is supple, no gross swelling noted. CHEST:   Diminished breath sounds bilaterally.  No obvious crackles or wheezes noted. CVS: S1 and S2 heard, no murmur.  ABDOMEN: Soft, non-tender, bowel sounds are present. EXTREMITIES: Redness of the bilateral lower extremities, knee tenderness on palpation. CNS: Cranial nerves are intact. No focal  motor deficits. SKIN: warm and dry without rashes.  Data Review: I have personally reviewed the following laboratory data and studies,  CBC: Recent Labs  Lab 11/08/19 1722 11/10/19 0455 11/11/19 0442  WBC 9.0 6.6 8.0  NEUTROABS 7.9*  --   --   HGB 12.4 11.5* 12.4  HCT 40.0 38.0 41.1  MCV 102.6* 103.8* 105.4*  PLT 260 237 [id number]   Basic Metabolic Panel: Recent Labs  Lab 11/08/19 1722 11/09/19 0000 11/09/19 0431 11/10/19 0455 11/11/19 0442  NA 141  --  139 145 144  K 5.6* 5.4* 5.3* 5.4* 4.4  CL 109  --  109 115* 112*  CO2 21*  --  19* 22 23  GLUCOSE 101*  --  84 128* 88  BUN 98*  --  97* 79* 61*  CREATININE 5.23*  --  5.24* 3.53* 2.43*  CALCIUM 8.7*  --  8.3* 8.5* 8.3*  MG 2.1  --   --  1.9 1.8  PHOS 6.6*  --  6.6* 5.2* 3.6   Liver Function Tests: Recent Labs  Lab 11/08/19 1722 11/09/19 0431 11/10/19 0455  11/11/19 0442  AST 14*  --  13* 13*  ALT 14  --  13 14  ALKPHOS 66  --  57 54  BILITOT 0.6  --  0.3 0.3  PROT 6.9  --  6.1* 6.2*  ALBUMIN 3.3* 3.0* 2.7* 2.8*   No results for input(s): LIPASE, AMYLASE in the last 168 hours. No results for input(s): AMMONIA in the last 168 hours. Cardiac Enzymes: No results for input(s): CKTOTAL, CKMB, CKMBINDEX, TROPONINI in the last 168 hours. BNP (last 3 results) Recent Labs    11/08/19 1722  BNP 345.3*    ProBNP (last 3 results) No results for input(s): PROBNP in the last 8760 hours.  CBG: No results for input(s): GLUCAP in the last 168 hours. Recent Results (from the past 240 hour(s))  Respiratory Panel by RT PCR (Flu A&B, Covid) - Nasopharyngeal Swab     Status: None   Collection Time: 11/08/19 10:48 PM   Specimen: Nasopharyngeal Swab  Result Value Ref Range Status   SARS Coronavirus 2 by RT PCR NEGATIVE NEGATIVE Final    Comment: (NOTE) SARS-CoV-2 target nucleic acids are NOT DETECTED. The SARS-CoV-2 RNA is generally detectable in upper respiratoy specimens during the acute phase of infection. The  lowest concentration of SARS-CoV-2 viral copies this assay can detect is 131 copies/mL. A negative result does not preclude SARS-Cov-2 infection and should not be used as the sole basis for treatment or other patient management decisions. A negative result may occur with  improper specimen collection/handling, submission of specimen other than nasopharyngeal swab, presence of viral mutation(s) within the areas targeted by this assay, and inadequate number of viral copies (<131 copies/mL). A negative result must be combined with clinical observations, patient history, and epidemiological information. The expected result is Negative. Fact Sheet for Patients:  PinkCheek.be Fact Sheet for Healthcare Providers:  GravelBags.it This test is not yet ap proved or cleared by the [place] FDA and  has been authorized for detection and/or diagnosis of SARS-CoV-2 by FDA under an Emergency Use Authorization (EUA). This EUA will remain  in effect (meaning this test can be used) for the duration of the COVID-19 declaration under Section 564(b)(1) of the Act, 21 U.S.C. section 360bbb-3(b)(1), unless the authorization is terminated or revoked sooner.    Influenza A by PCR NEGATIVE NEGATIVE Final   Influenza B by PCR NEGATIVE NEGATIVE Final    Comment: (NOTE) The Xpert Xpress SARS-CoV-2/FLU/RSV assay is intended as an aid in  the diagnosis of influenza from Nasopharyngeal swab specimens and  should not be used as a sole basis for treatment. Nasal washings and  aspirates are unacceptable for Xpert Xpress SARS-CoV-2/FLU/RSV  testing. Fact Sheet for Patients: PinkCheek.be Fact Sheet for Healthcare Providers: GravelBags.it This test is not yet approved or cleared by the [place] FDA and  has been authorized for detection and/or diagnosis of SARS-CoV-2 by  FDA under an Emergency  Use Authorization (EUA). This EUA will remain  in effect (meaning this test can be used) for the duration of the  Covid-19 declaration under Section 564(b)(1) of the Act, 21  U.S.C. section 360bbb-3(b)(1), unless the authorization is  terminated or revoked. Performed at [facility], [place] [address], [place],  [address]   Anaerobic culture     Status: None (Preliminary result)   Collection Time: 11/09/19 11:08 AM   Specimen: Urine, Cystoscope; Urinary  Result Value Ref Range Status   Specimen Description   Final    CYSTOSCOPY Performed at  [facility], [place] [address] [place], [place] [address]    Special Requests   Final    STENT Performed at [facility], [place] [address] [place], [place] [address]    Gram Stain   Final    CYTOSPIN SMEAR WBC PRESENT, PREDOMINANTLY PMN NO ORGANISMS SEEN Performed at [facility], [place] [address] [place], [place] [address]    Culture   Final    NO ANAEROBES ISOLATED; CULTURE IN PROGRESS FOR 5 DAYS   Report Status PENDING  Incomplete  Urine Culture     Status: None   Collection Time: 11/09/19 11:08 AM   Specimen: Urinary Bladder  Result Value Ref Range Status   Specimen Description BLADDER CYTOSCOPY  Final   Special Requests NONE  Final   Culture   Final    NO GROWTH Performed at [facility], [place] [address], [place], [place] [address]    Report Status 11/10/2019 FINAL  Final     Studies: CT CHEST WO CONTRAST  Result Date: 11/09/2019 CLINICAL DATA:  74 year old female with pleural effusion seen on the x-ray. EXAM: CT CHEST WITHOUT CONTRAST TECHNIQUE: Multidetector CT imaging of the chest was performed following the standard protocol without IV contrast. COMPARISON:  Chest radiograph dated 11/08/2019. FINDINGS: Evaluation of this exam is limited in the absence of intravenous contrast. Cardiovascular: There is moderate cardiomegaly. Advanced coronary vascular  calcification of the LAD as well as calcification of the mitral annulus. There is mild atherosclerotic calcification of the thoracic aorta. There is enlargement of the main pulmonary trunk suggestive of a degree of pulmonary hypertension. Clinical correlation is recommended. Mediastinum/Nodes: There is no hilar or mediastinal adenopathy. The esophagus is grossly unremarkable. No mediastinal fluid collection. Lungs/Pleura: There is a moderate right pleural effusion with partial compressive atelectasis of the right lower lobe. Pneumonia is not excluded clinical correlation is recommended. Bibasilar linear and bandlike atelectasis/scarring. There is no pneumothorax. The central airways are patent. Upper Abdomen: Partially visualized air within the right kidney may be related to recent instrumentation or infection. Clinical correlation is recommended. There is an indeterminate 2 cm right adrenal nodule. Musculoskeletal: Osteopenia with degenerative changes of the spine. No acute osseous pathology. IMPRESSION: 1. Moderate right pleural effusion with partial compressive atelectasis of the right lower lobe. Pneumonia is not excluded. Clinical correlation is recommended. 2. Moderate cardiomegaly with advanced coronary vascular calcification of the LAD. 3. Partially visualized air within the right kidney may be related to recent instrumentation or infection. Clinical correlation is recommended. 4. Aortic Atherosclerosis (ICD10-I70.0). Electronically Signed   By: Anner Crete M.D.   On: 11/09/2019 20:37   DG C-Arm 1-60 Min-No Report  Result Date: 11/09/2019 Fluoroscopy was utilized by the requesting physician.  No radiographic interpretation.   ECHOCARDIOGRAM COMPLETE  Result Date: 11/09/2019    ECHOCARDIOGRAM REPORT   Patient Name:   Gloria [name] Date of Exam: 11/09/2019 Medical Rec #:  [id number]           Height:       63.0 in Accession #:    [id number]          Weight:       217.8 lb Date of Birth:  [birth year]             BSA:          2.005 m Patient Age:    37 years            BP:  132/78 mmHg Patient Gender: F                   HR:           53 bpm. Exam Location:  Inpatient Procedure: 2D Echo, Cardiac Doppler and Color Doppler Indications:    I50.33 Acute on chronic diastolic (congestive) heart failure  History:        Patient has no prior history of Echocardiogram examinations.                 CHF, Abnormal ECG; Arrythmias:Atrial Fibrillation.  Sonographer:    Roseanna Rainbow RDCS Referring Phys: [id number] [facility]  Sonographer Comments: Patient is morbidly obese and Technically difficult study due to poor echo windows. Patient complained of pain form pressure in apical region. IMPRESSIONS  1. Left ventricular ejection fraction, by estimation, is 60 to 65%. The left ventricle has normal function. The left ventricle has no regional wall motion abnormalities. There is severe left ventricular hypertrophy. Left ventricular diastolic function could not be evaluated.  2. Right ventricular systolic function is normal. The right ventricular size is moderately enlarged. There is moderately elevated pulmonary artery systolic pressure. The estimated right ventricular systolic pressure is 99991111 mmHg.  3. Left atrial size was severely dilated.  4. Right atrial size was mildly dilated.  5. The mitral valve is normal in structure. Mild mitral valve regurgitation.  6. Tricuspid valve regurgitation is moderate.  7. The aortic valve is tricuspid. Aortic valve regurgitation is mild. No aortic stenosis is present.  8. Aortic dilatation noted. 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  9. The inferior vena cava is dilated in size with <50% respiratory variability, suggesting right atrial pressure of 15 mmHg. FINDINGS  Left Ventricle: Left ventricular ejection fraction, by estimation, is 60 to 65%. The left ventricle has normal function. The left ventricle has no regional wall motion abnormalities. The  left ventricular internal cavity size was normal in size. There is  severe left ventricular hypertrophy. Left ventricular diastolic function could not be evaluated due to atrial fibrillation. Left ventricular diastolic function could not be evaluated. Right Ventricle: The right ventricular size is moderately enlarged. No increase in right ventricular wall thickness. Right ventricular systolic function is normal. There is moderately elevated pulmonary artery systolic pressure. The tricuspid regurgitant  velocity is 3.19 m/s, and with an assumed right atrial pressure of 15 mmHg, the estimated right ventricular systolic pressure is 99991111 mmHg. Left Atrium: Left atrial size was severely dilated. Right Atrium: Right atrial size was mildly dilated. Pericardium: Trivial pericardial effusion is present. Mitral Valve: The mitral valve is normal in structure. Moderate mitral annular calcification. Mild mitral valve regurgitation. MV peak gradient, 11.0 mmHg. The mean mitral valve gradient is 3.0 mmHg. Tricuspid Valve: The tricuspid valve is normal in structure. Tricuspid valve regurgitation is moderate. Aortic Valve: The aortic valve is tricuspid. . There is mild thickening of the aortic valve. Aortic valve regurgitation is mild. No aortic stenosis is present. Mild aortic valve annular calcification. There is mild thickening of the aortic valve. Aortic valve mean gradient measures 5.0 mmHg. Aortic valve peak gradient measures 11.4 mmHg. Aortic valve area, by VTI measures 1.77 cm. Pulmonic Valve: The pulmonic valve was normal in structure. Pulmonic valve regurgitation is not visualized. No evidence of pulmonic stenosis. Aorta: Aortic dilatation noted. 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 Venous: The inferior vena cava is dilated in size with less than 50% respiratory  variability, suggesting right atrial pressure of 15 mmHg. IAS/Shunts: The atrial septum is grossly normal.  LEFT VENTRICLE PLAX 2D  LVIDd:         3.72 cm     Diastology LVIDs:         1.83 cm     LV e' lateral:   6.58 cm/s LV PW:         1.82 cm     LV E/e' lateral: 23.3 LV IVS:        2.09 cm     LV e' medial:    5.06 cm/s LVOT diam:     1.80 cm     LV E/e' medial:  30.3 LV SV:         65 LV SV Index:   32 LVOT Area:     2.54 cm  LV Volumes (MOD) LV vol d, MOD A2C: 45.7 ml LV vol d, MOD A4C: 60.5 ml LV vol s, MOD A2C: 19.7 ml LV vol s, MOD A4C: 15.8 ml LV SV MOD A2C:     26.0 ml LV SV MOD A4C:     60.5 ml LV SV MOD BP:      37.7 ml RIGHT VENTRICLE            IVC RV S prime:     7.97 cm/s  IVC diam: 3.03 cm TAPSE (M-mode): 1.9 cm LEFT ATRIUM              Index       RIGHT ATRIUM           Index LA diam:        5.10 cm  2.54 cm/m  RA Area:     23.90 cm LA Vol (A2C):   64.4 ml  32.12 ml/m RA Volume:   78.80 ml  39.30 ml/m LA Vol (A4C):   164.0 ml 81.79 ml/m LA Biplane Vol: 106.0 ml 52.87 ml/m  AORTIC VALVE AV Area (Vmax):    1.64 cm AV Area (Vmean):   1.72 cm AV Area (VTI):     1.77 cm AV Vmax:           169.00 cm/s AV Vmean:          105.000 cm/s AV VTI:            0.369 m AV Peak Grad:      11.4 mmHg AV Mean Grad:      5.0 mmHg LVOT Vmax:         109.00 cm/s LVOT Vmean:        70.900 cm/s LVOT VTI:          0.256 m LVOT/AV VTI ratio: 0.69  AORTA Ao Root diam: 3.60 cm Ao Asc diam:  4.00 cm MITRAL VALVE                TRICUSPID VALVE MV Area (PHT): 3.32 cm     TR Peak grad:   40.7 mmHg MV Peak grad:  11.0 mmHg    TR Vmax:        319.00 cm/s MV Mean grad:  3.0 mmHg MV Vmax:       1.66 m/s     SHUNTS MV Vmean:      72.2 cm/s    Systemic VTI:  0.26 m MV Decel Time: 229 msec     Systemic Diam: 1.80 cm MV E velocity: 153.50 cm/s MV A velocity: 41.60 cm/s MV E/A ratio:  3.69 Mertie Moores MD Electronically signed  by Mertie Moores MD Signature Date/Time: 11/09/2019/2:16:33 PM    Final       Flora Lipps, MD  [facility] 11/11/2019

## 2019-11-11 NOTE — Care Management Important Message (Signed)
Important Message  Patient Details IM Letter given to Evette Cristal SW Case Manager to present to the Patient Name: Gloria [name] MRN: [id number] Date of Birth: [birth year]   Medicare Important Message Given:  Yes     Kerin Salen 11/11/2019, 10:37 AM

## 2019-11-12 LAB — CBC
HCT: 42.3 % (ref 36.0–46.0)
Hemoglobin: 12.6 g/dL (ref 12.0–15.0)
MCH: 31.4 pg (ref 26.0–34.0)
MCHC: 29.8 g/dL — ABNORMAL LOW (ref 30.0–36.0)
MCV: 105.5 fL — ABNORMAL HIGH (ref 80.0–100.0)
Platelets: 223 10*3/uL (ref 150–400)
RBC: 4.01 MIL/uL (ref 3.87–5.11)
RDW: 13.2 % (ref 11.5–15.5)
WBC: 5.7 10*3/uL (ref 4.0–10.5)
nRBC: 0 % (ref 0.0–0.2)

## 2019-11-12 LAB — BASIC METABOLIC PANEL
Anion gap: 8 (ref 5–15)
BUN: 47 mg/dL — ABNORMAL HIGH (ref 8–23)
CO2: 26 mmol/L (ref 22–32)
Calcium: 8.3 mg/dL — ABNORMAL LOW (ref 8.9–10.3)
Chloride: 110 mmol/L (ref 98–111)
Creatinine, Ser: 2.02 mg/dL — ABNORMAL HIGH (ref 0.44–1.00)
GFR calc Af Amer: 27 mL/min — ABNORMAL LOW (ref >60)
GFR calc non Af Amer: 24 mL/min — ABNORMAL LOW (ref >60)
Glucose, Bld: 101 mg/dL — ABNORMAL HIGH (ref 70–99)
Potassium: 4.1 mmol/L (ref 3.5–5.1)
Sodium: 144 mmol/L (ref 135–145)

## 2019-11-12 LAB — MAGNESIUM: Magnesium: 1.4 mg/dL — ABNORMAL LOW (ref 1.7–2.4)

## 2019-11-12 LAB — PHOSPHORUS: Phosphorus: 3.1 mg/dL (ref 2.5–4.6)

## 2019-11-12 MED ORDER — MAGNESIUM OXIDE 400 (241.3 MG) MG PO TABS
400.0000 mg | ORAL_TABLET | Freq: Two times a day (BID) | ORAL | Status: DC
  Administered 2019-11-12: 400 mg via ORAL
  Filled 2019-11-12: qty 1

## 2019-11-12 MED ORDER — MAGNESIUM OXIDE 400 (241.3 MG) MG PO TABS: 400.0000 mg | ORAL_TABLET | Freq: Two times a day (BID) | ORAL | 0 refills | Status: DC

## 2019-11-12 MED ORDER — FUROSEMIDE 20 MG PO TABS: 20.0000 mg | ORAL_TABLET | ORAL | 0 refills | Status: DC | PRN

## 2019-11-12 MED ORDER — MAGNESIUM SULFATE 2 GM/50ML IV SOLN
2.0000 g | Freq: Once | INTRAVENOUS | Status: AC
  Administered 2019-11-12: 2 g via INTRAVENOUS
  Filled 2019-11-12: qty 50

## 2019-11-12 NOTE — Discharge Summary (Signed)
Physician Discharge Summary  Gloria [name] [id number] DOB: Apr 09, 1946 DOA: 11/08/2019  PCP: Maryella Shivers, MD  Admit date: 11/08/2019 Discharge date: 11/12/2019  Admitted From: Home  Discharge disposition: Home   Recommendations for Outpatient Follow-Up:   . Follow up with your primary care provider in one week.  . Check CBC, BMP, magnesium in the next visit.  Patient did have obstructive uropathy with acute kidney injury.  This will need to be closely monitored. . Follow-up with Dr. Louis Meckel, urology in 1 to 2 weeks to discuss about kidney stones and further care.   Discharge Diagnosis:   Principal Problem:   Acute renal failure (ARF) (HCC) Active Problems:   Obstructive uropathy   Hyperkalemia   Metabolic acidosis   Hyperphosphatemia   Discharge Condition: Improved.  Diet recommendation: Low sodium, heart healthy.   Wound care: None.  Code status: Full.   History of Present Illness:   Gloria [name] a 74 y.o.femalewith past medical history significant ofatrial fibrillation, hypertension was sent to the emergency department for abnormal labs.  Patient is a hard of hearing but  reads lips.  Information was obtained from the patient's brother who stated that the patient had gone to her primary care physician for elevated blood pressure and had elevated creatinine of 7.0.  Repeat labs were done which showed a creatinine of 5.  Patient was also complained of groin pain shortness of breath.  History of chronic leg swelling.  Patient does live by herself at home.  ED Course: Patient did not have any fever.  Labs showed no leukocytosis. BUN 98, creatinine 5.2. GFR 8. Potassium 5.6. Bicarb 21, anion gap 11. BNP 345. Phosphorus 6.6. UA with evidence of microscopic hematuria but no significant pyuria or bacteriuria to suggest infection. SARS-CoV-2 PCR test pending. Chest x-ray showing stable moderate cardiomegaly and no acute cardiopulmonary disease. Renal  ultrasound showing increased renal parenchymal echogenicity, right renal calculi, and moderate bilateral hydronephrosis. CT renal stone study showing moderate to severe right hydronephrosis and moderate left hydronephrosis. Large stones within the bilateral renal pelvises measuring 16 mm in the right and 16 mm on the left, contributing to bilateral hydronephrosis. No ureteral stones. Multiple additional intrarenal stones on the right.  CT renal stone study also showed cardiomegaly and moderate right pleural effusion.  Patient was then admitted to hospital for further evaluation and care.   Hospital Course:   Following conditions were addressed during hospitalization as listed below,  Acute kidney injury secondary to obstructive uropathy: BUN 98, creatinine 5.2. GFR 8 on presentation.  CT showing large stones within the bilateral renal pelvises contributing to bilateral hydronephrosis (moderate to severe in the right and moderate on the left). Multiple additional intrarenal stones on the right.  Status post bilateral retrograde pyelography with bilateral ureteral stent placement on 11/09/2019.  Improvement in renal function .  Creatinine has gone down to 2.02 from.  on Myrbetriq by urology. Patient will need to follow-up with urology as outpatient for nephrolithiasis.  Continue Myrbetriq on discharge.  Mild hyperkalemia: Resolved at this time.  Received Lokelma x1.  Will need to check BMP as outpatient.  Potassium prior to discharge was 4.1.  Mild normal anion gap metabolic acidosis: Resolved.  Hyperphosphatemia: Likely secondary to acute kidney injury, phosphorus 6.6 presentation.  Improved to 3.1 today.  Volume overload:   Chest x-ray with moderate cardiomegaly.  CT scan with moderate pleural effusion on presentation..   2D echocardiogram from 11/09/2019 shows LV ejection fraction of 60 to 65%  with severe LVH and elevated pulmonary artery pressure at 55.Marland Kitchen  No dyspnea and appears to be compensated.   Patient will resume her home Lasix in few days after discharge.  Atrial fibrillation: Rate controlled at this time.  Continue aspirin Coreg.  Not on anticoagulation due to high risk for falls  Essential hypertension: Continue Coreg on discharge.  Debility, deconditioning.  Patient uses 2 canes at home for ambulation.  PT evaluation recommend home health on discharge.  Home health has been requested on discharge.  Disposition.  At this time, patient is stable for disposition home with home health.  I also spoke with the patient's brother on the phone and updated him about the clinical condition of the patient and the plan for disposition home today.  Medical Consultants:   Urology Procedures:     Retrograde ureteroscopy with bilateral ureteric stent placement on 11/19/2019 by urology. Subjective:   Today, patient feels okay.  Denies any urinary urgency, frequency or dysuria.  Denies fever, chills or rigor.  Discharge Exam:   Vitals:   11/11/19 1933 11/12/19 0340  BP: (!) 118/59 139/60  Pulse: (!) 48 (!) 59  Resp: 18 20  Temp: 98.7 F (37.1 C) 98 F (36.7 C)  SpO2: 94% 96%   Vitals:   11/11/19 1314 11/11/19 1933 11/12/19 0340 11/12/19 0506  BP: (!) 111/55 (!) 118/59 139/60   Pulse: (!) 52 (!) 48 (!) 59   Resp: 18 18 20    Temp: 98.2 F (36.8 C) 98.7 F (37.1 C) 98 F (36.7 C)   TempSrc: Oral Oral Oral   SpO2: 94% 94% 96%   Weight:    94.4 kg  Height:        General: Alert awake, not in obvious distress, hard of hearing.  Reads lips.  Morbidly obese. HENT: pupils equally reacting to light,  No scleral pallor or icterus noted. Oral mucosa is moist.  Chest:  Clear breath sounds.  Diminished breath sounds bilaterally. No crackles or wheezes.  CVS: S1 &S2 heard. No murmur.  Regular rate and rhythm. Abdomen: Soft, nontender, nondistended.  Bowel sounds are heard.   Extremities: No cyanosis, clubbing or edema.  Peripheral pulses are palpable. Psych: Alert, awake and  oriented, normal mood CNS:  No cranial nerve deficits.  Power equal in all extremities.   Skin: Warm and dry, leg redness bilaterally.  The results of significant diagnostics from this hospitalization (including imaging, microbiology, ancillary and laboratory) are listed below for reference.     Diagnostic Studies:   CT CHEST WO CONTRAST  Result Date: 11/09/2019 CLINICAL DATA:  74 year old female with pleural effusion seen on the x-ray. EXAM: CT CHEST WITHOUT CONTRAST TECHNIQUE: Multidetector CT imaging of the chest was performed following the standard protocol without IV contrast. COMPARISON:  Chest radiograph dated 11/08/2019. FINDINGS: Evaluation of this exam is limited in the absence of intravenous contrast. Cardiovascular: There is moderate cardiomegaly. Advanced coronary vascular calcification of the LAD as well as calcification of the mitral annulus. There is mild atherosclerotic calcification of the thoracic aorta. There is enlargement of the main pulmonary trunk suggestive of a degree of pulmonary hypertension. Clinical correlation is recommended. Mediastinum/Nodes: There is no hilar or mediastinal adenopathy. The esophagus is grossly unremarkable. No mediastinal fluid collection. Lungs/Pleura: There is a moderate right pleural effusion with partial compressive atelectasis of the right lower lobe. Pneumonia is not excluded clinical correlation is recommended. Bibasilar linear and bandlike atelectasis/scarring. There is no pneumothorax. The central airways are patent. Upper Abdomen: Partially  visualized air within the right kidney may be related to recent instrumentation or infection. Clinical correlation is recommended. There is an indeterminate 2 cm right adrenal nodule. Musculoskeletal: Osteopenia with degenerative changes of the spine. No acute osseous pathology. IMPRESSION: 1. Moderate right pleural effusion with partial compressive atelectasis of the right lower lobe. Pneumonia is not  excluded. Clinical correlation is recommended. 2. Moderate cardiomegaly with advanced coronary vascular calcification of the LAD. 3. Partially visualized air within the right kidney may be related to recent instrumentation or infection. Clinical correlation is recommended. 4. Aortic Atherosclerosis (ICD10-I70.0). Electronically Signed   By: Anner Crete M.D.   On: 11/09/2019 20:37   US Renal  Result Date: 11/08/2019 CLINICAL DATA:  74 year old female with acute renal insufficiency. EXAM: RENAL / URINARY TRACT ULTRASOUND COMPLETE COMPARISON:  None. FINDINGS: Evaluation is very limited due to body habitus and inability of the patient to cooperate with exam. Right Kidney: Renal measurements: 13.4 x 7.3 x 8.5 cm = volume: 437 mL. There is mild increased renal parenchymal echogenicity. There is moderate hydronephrosis. Inferior pole stone or clusters of adjacent stones measure up to 2.5 cm. Left Kidney: Renal measurements: 12.5 x 6.1 x 7.0 cm = volume: 278 mL. The left kidney is suboptimally visualized. There is mild increased echogenicity. There is moderate left hydronephrosis. No shadowing stone. Bladder: Not visualized. Other: None. IMPRESSION: 1. Increased renal parenchymal echogenicity in keeping with underlying kidney disease. 2. Moderate bilateral hydronephrosis. 3. Right renal calculi. Electronically Signed   By: Anner Crete M.D.   On: 11/08/2019 20:55   DG Chest Port 1 View  Result Date: 11/08/2019 CLINICAL DATA:  Severe anxiety.  Short of breath and weakness EXAM: PORTABLE CHEST 1 VIEW COMPARISON:  12/28/2018 FINDINGS: Moderate enlargement of the cardiopericardial silhouette. No mediastinal or hilar masses. Clear lungs.  No convincing pleural effusion and no pneumothorax. Skeletal structures are grossly intact. IMPRESSION: 1. No acute cardiopulmonary disease. 2. Stable moderate cardiomegaly. Electronically Signed   By: Lajean Manes M.D.   On: 11/08/2019 18:28   DG C-Arm 1-60 Min-No  Report  Result Date: 11/09/2019 Fluoroscopy was utilized by the requesting physician.  No radiographic interpretation.   ECHOCARDIOGRAM COMPLETE  Result Date: 11/09/2019    ECHOCARDIOGRAM REPORT   Patient Name:   JOSETT NICHOL Date of Exam: 11/09/2019 Medical Rec #:  [id number]           Height:       63.0 in Accession #:    [id number]          Weight:       217.8 lb Date of Birth:  09/20/1945            BSA:          2.005 m Patient Age:    62 years            BP:           132/78 mmHg Patient Gender: F                   HR:           53 bpm. Exam Location:  Inpatient Procedure: 2D Echo, Cardiac Doppler and Color Doppler Indications:    I50.33 Acute on chronic diastolic (congestive) heart failure  History:        Patient has no prior history of Echocardiogram examinations.                 CHF, Abnormal ECG; Arrythmias:Atrial Fibrillation.  Sonographer:    Roseanna Rainbow RDCS Referring Phys: [id number] [facility]  Sonographer Comments: Patient is morbidly obese and Technically difficult study due to poor echo windows. Patient complained of pain form pressure in apical region. IMPRESSIONS  1. Left ventricular ejection fraction, by estimation, is 60 to 65%. The left ventricle has normal function. The left ventricle has no regional wall motion abnormalities. There is severe left ventricular hypertrophy. Left ventricular diastolic function could not be evaluated.  2. Right ventricular systolic function is normal. The right ventricular size is moderately enlarged. There is moderately elevated pulmonary artery systolic pressure. The estimated right ventricular systolic pressure is [id number] mmHg.  3. Left atrial size was severely dilated.  4. Right atrial size was mildly dilated.  5. The mitral valve is normal in structure. Mild mitral valve regurgitation.  6. Tricuspid valve regurgitation is moderate.  7. The aortic valve is tricuspid. Aortic valve regurgitation is mild. No aortic stenosis is present.  8. Aortic  dilatation noted. 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  9. The inferior vena cava is dilated in size with <50% respiratory variability, suggesting right atrial pressure of 15 mmHg. FINDINGS  Left Ventricle: Left ventricular ejection fraction, by estimation, is 60 to 65%. The left ventricle has normal function. The left ventricle has no regional wall motion abnormalities. The left ventricular internal cavity size was normal in size. There is  severe left ventricular hypertrophy. Left ventricular diastolic function could not be evaluated due to atrial fibrillation. Left ventricular diastolic function could not be evaluated. Right Ventricle: The right ventricular size is moderately enlarged. No increase in right ventricular wall thickness. Right ventricular systolic function is normal. There is moderately elevated pulmonary artery systolic pressure. The tricuspid regurgitant  velocity is 3.19 m/s, and with an assumed right atrial pressure of 15 mmHg, the estimated right ventricular systolic pressure is 99991111 mmHg. Left Atrium: Left atrial size was severely dilated. Right Atrium: Right atrial size was mildly dilated. Pericardium: Trivial pericardial effusion is present. Mitral Valve: The mitral valve is normal in structure. Moderate mitral annular calcification. Mild mitral valve regurgitation. MV peak gradient, 11.0 mmHg. The mean mitral valve gradient is 3.0 mmHg. Tricuspid Valve: The tricuspid valve is normal in structure. Tricuspid valve regurgitation is moderate. Aortic Valve: The aortic valve is tricuspid. . There is mild thickening of the aortic valve. Aortic valve regurgitation is mild. No aortic stenosis is present. Mild aortic valve annular calcification. There is mild thickening of the aortic valve. Aortic valve mean gradient measures 5.0 mmHg. Aortic valve peak gradient measures 11.4 mmHg. Aortic valve area, by VTI measures 1.77 cm. Pulmonic Valve: The pulmonic valve was  normal in structure. Pulmonic valve regurgitation is not visualized. No evidence of pulmonic stenosis. Aorta: Aortic dilatation noted. 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 Venous: The inferior vena cava is dilated in size with less than 50% respiratory variability, suggesting right atrial pressure of 15 mmHg. IAS/Shunts: The atrial septum is grossly normal.  LEFT VENTRICLE PLAX 2D LVIDd:         3.72 cm     Diastology LVIDs:         1.83 cm     LV e' lateral:   6.58 cm/s LV PW:         1.82 cm     LV E/e' lateral: 23.3 LV IVS:        2.09 cm     LV e' medial:  5.06 cm/s LVOT diam:     1.80 cm     LV E/e' medial:  30.3 LV SV:         65 LV SV Index:   32 LVOT Area:     2.54 cm  LV Volumes (MOD) LV vol d, MOD A2C: 45.7 ml LV vol d, MOD A4C: 60.5 ml LV vol s, MOD A2C: 19.7 ml LV vol s, MOD A4C: 15.8 ml LV SV MOD A2C:     26.0 ml LV SV MOD A4C:     60.5 ml LV SV MOD BP:      37.7 ml RIGHT VENTRICLE            IVC RV S prime:     7.97 cm/s  IVC diam: 3.03 cm TAPSE (M-mode): 1.9 cm LEFT ATRIUM              Index       RIGHT ATRIUM           Index LA diam:        5.10 cm  2.54 cm/m  RA Area:     23.90 cm LA Vol (A2C):   64.4 ml  32.12 ml/m RA Volume:   78.80 ml  39.30 ml/m LA Vol (A4C):   164.0 ml 81.79 ml/m LA Biplane Vol: 106.0 ml 52.87 ml/m  AORTIC VALVE AV Area (Vmax):    1.64 cm AV Area (Vmean):   1.72 cm AV Area (VTI):     1.77 cm AV Vmax:           169.00 cm/s AV Vmean:          105.000 cm/s AV VTI:            0.369 m AV Peak Grad:      11.4 mmHg AV Mean Grad:      5.0 mmHg LVOT Vmax:         109.00 cm/s LVOT Vmean:        70.900 cm/s LVOT VTI:          0.256 m LVOT/AV VTI ratio: 0.69  AORTA Ao Root diam: 3.60 cm Ao Asc diam:  4.00 cm MITRAL VALVE                TRICUSPID VALVE MV Area (PHT): 3.32 cm     TR Peak grad:   40.7 mmHg MV Peak grad:  11.0 mmHg    TR Vmax:        319.00 cm/s MV Mean grad:  3.0 mmHg MV Vmax:       1.66 m/s     SHUNTS MV Vmean:      72.2 cm/s     Systemic VTI:  0.26 m MV Decel Time: 229 msec     Systemic Diam: 1.80 cm MV E velocity: 153.50 cm/s MV A velocity: 41.60 cm/s MV E/A ratio:  3.69 Mertie Moores MD Electronically signed by Mertie Moores MD Signature Date/Time: 11/09/2019/2:16:33 PM    Final    CT Renal Stone Study  Result Date: 11/08/2019 CLINICAL DATA:  Flank pain EXAM: CT ABDOMEN AND PELVIS WITHOUT CONTRAST TECHNIQUE: Multidetector CT imaging of the abdomen and pelvis was performed following the standard protocol without IV contrast. COMPARISON:  CT 07/03/2015, ultrasound 11/08/2019 FINDINGS: Lower chest: Moderate right pleural effusion.  Cardiomegaly. Hepatobiliary: Status post cholecystectomy. No focal hepatic abnormality or biliary dilatation. Pancreas: Unremarkable. No pancreatic ductal dilatation or surrounding inflammatory changes. Spleen: Normal in size without focal abnormality. Adrenals/Urinary Tract: 2.6  cm right adrenal adenoma. 2.3 cm left adrenal adenoma. These are unchanged. Moderate to marked right hydronephrosis and moderate left hydronephrosis. Numerous intrarenal stones on the right. Twelve by 16 mm stone in the left renal pelvis. Sixteen by 15 mm stone in the right renal pelvis. Multiple additional stones in the lower pole of the right kidney. Considerable right perinephric stranding. No distal ureteral stones. Urinary bladder is unremarkable Stomach/Bowel: The stomach is nonenlarged. No dilated small bowel. No bowel wall thickening. Sigmoid colon diverticula without acute inflammatory change Vascular/Lymphatic: Moderate aortic atherosclerosis without aneurysm. No suspicious adenopathy Reproductive: Uterus unremarkable. Stable ovoid calcification in the left adnexa Other: No free air. Small pelvic fluid. Left paramidline ventral hernia containing mesenteric fat. Generalized subcutaneous edema consistent with anasarca. Musculoskeletal: No acute or suspicious osseous abnormality IMPRESSION: 1. Moderate severe right hydronephrosis  and moderate left hydronephrosis. Large stones within the bilateral renal pelvises measuring 16 mm on the right and 16 mm on the left, contributing to bilateral hydronephrosis. No ureteral stones. Multiple additional intrarenal stones on the right 2. Stable bilateral adrenal gland adenomas. 3. Left abdominal ventral hernia containing fat 4. Sigmoid colon diverticular disease without acute inflammatory change 5. Cardiomegaly.  Moderate right pleural effusion Electronically Signed   By: Donavan Foil M.D.   On: 11/08/2019 22:40     Labs:   Basic Metabolic Panel: Recent Labs  Lab 11/08/19 1722 11/09/19 0000 11/09/19 0431 11/09/19 0431 11/10/19 0455 11/10/19 0455 11/11/19 0442 11/12/19 0637  NA 141  --  139  --  145  --  144 144  K 5.6*   < > 5.3*   < > 5.4*   < > 4.4 4.1  CL 109  --  109  --  115*  --  112* 110  CO2 21*  --  19*  --  22  --  23 26  GLUCOSE 101*  --  84  --  128*  --  88 101*  BUN 98*  --  97*  --  79*  --  61* 47*  CREATININE 5.23*  --  5.24*  --  3.53*  --  2.43* 2.02*  CALCIUM 8.7*  --  8.3*  --  8.5*  --  8.3* 8.3*  MG 2.1  --   --   --  1.9  --  1.8 1.4*  PHOS 6.6*  --  6.6*  --  5.2*  --  3.6 3.1   < > = values in this interval not displayed.   GFR Estimated Creatinine Clearance: 26.7 mL/min (A) (by C-G formula based on SCr of 2.02 mg/dL (H)). Liver Function Tests: Recent Labs  Lab 11/08/19 1722 11/09/19 0431 11/10/19 0455 11/11/19 0442  AST 14*  --  13* 13*  ALT 14  --  13 14  ALKPHOS 66  --  57 54  BILITOT 0.6  --  0.3 0.3  PROT 6.9  --  6.1* 6.2*  ALBUMIN 3.3* 3.0* 2.7* 2.8*   No results for input(s): LIPASE, AMYLASE in the last 168 hours. No results for input(s): AMMONIA in the last 168 hours. Coagulation profile No results for input(s): INR, PROTIME in the last 168 hours.  CBC: Recent Labs  Lab 11/08/19 1722 11/10/19 0455 11/11/19 0442 11/12/19 0637  WBC 9.0 6.6 8.0 5.7  NEUTROABS 7.9*  --   --   --   HGB 12.4 11.5* 12.4 12.6  HCT  40.0 38.0 41.1 42.3  MCV 102.6* 103.8* 105.4* 105.5*  PLT 260 237  277 223   Cardiac Enzymes: No results for input(s): CKTOTAL, CKMB, CKMBINDEX, TROPONINI in the last 168 hours. BNP: Invalid input(s): POCBNP CBG: No results for input(s): GLUCAP in the last 168 hours. D-Dimer No results for input(s): DDIMER in the last 72 hours. Hgb A1c No results for input(s): HGBA1C in the last 72 hours. Lipid Profile No results for input(s): CHOL, HDL, LDLCALC, TRIG, CHOLHDL, LDLDIRECT in the last 72 hours. Thyroid function studies No results for input(s): TSH, T4TOTAL, T3FREE, THYROIDAB in the last 72 hours.  Invalid input(s): FREET3 Anemia work up No results for input(s): VITAMINB12, FOLATE, FERRITIN, TIBC, IRON, RETICCTPCT in the last 72 hours. Microbiology Recent Results (from the past 240 hour(s))  Respiratory Panel by RT PCR (Flu A&B, Covid) - Nasopharyngeal Swab     Status: None   Collection Time: 11/08/19 10:48 PM   Specimen: Nasopharyngeal Swab  Result Value Ref Range Status   SARS Coronavirus 2 by RT PCR NEGATIVE NEGATIVE Final    Comment: (NOTE) SARS-CoV-2 target nucleic acids are NOT DETECTED. The SARS-CoV-2 RNA is generally detectable in upper respiratoy specimens during the acute phase of infection. The lowest concentration of SARS-CoV-2 viral copies this assay can detect is 131 copies/mL. A negative result does not preclude SARS-Cov-2 infection and should not be used as the sole basis for treatment or other patient management decisions. A negative result may occur with  improper specimen collection/handling, submission of specimen other than nasopharyngeal swab, presence of viral mutation(s) within the areas targeted by this assay, and inadequate number of viral copies (<131 copies/mL). A negative result must be combined with clinical observations, patient history, and epidemiological information. The expected result is Negative. Fact Sheet for Patients:   PinkCheek.be Fact Sheet for Healthcare Providers:  GravelBags.it This test is not yet ap proved or cleared by the [place] FDA and  has been authorized for detection and/or diagnosis of SARS-CoV-2 by FDA under an Emergency Use Authorization (EUA). This EUA will remain  in effect (meaning this test can be used) for the duration of the COVID-19 declaration under Section 564(b)(1) of the Act, 21 U.S.C. section 360bbb-3(b)(1), unless the authorization is terminated or revoked sooner.    Influenza A by PCR NEGATIVE NEGATIVE Final   Influenza B by PCR NEGATIVE NEGATIVE Final    Comment: (NOTE) The Xpert Xpress SARS-CoV-2/FLU/RSV assay is intended as an aid in  the diagnosis of influenza from Nasopharyngeal swab specimens and  should not be used as a sole basis for treatment. Nasal washings and  aspirates are unacceptable for Xpert Xpress SARS-CoV-2/FLU/RSV  testing. Fact Sheet for Patients: PinkCheek.be Fact Sheet for Healthcare Providers: GravelBags.it This test is not yet approved or cleared by the [place] FDA and  has been authorized for detection and/or diagnosis of SARS-CoV-2 by  FDA under an Emergency Use Authorization (EUA). This EUA will remain  in effect (meaning this test can be used) for the duration of the  Covid-19 declaration under Section 564(b)(1) of the Act, 21  U.S.C. section 360bbb-3(b)(1), unless the authorization is  terminated or revoked. Performed at [facility], [place] [address] [place], [place] [address]   Anaerobic culture     Status: None (Preliminary result)   Collection Time: 11/09/19 11:08 AM   Specimen: Urine, Cystoscope; Urinary  Result Value Ref Range Status   Specimen Description   Final    CYSTOSCOPY Performed at [facility] [address], [place], [place] [address]    Special  Requests  Final    STENT Performed at [facility], [place] [address] [place], [place] [address]    Gram Stain   Final    CYTOSPIN SMEAR WBC PRESENT, PREDOMINANTLY PMN NO ORGANISMS SEEN Performed at [facility], [place] [address] [place], [place] [address]    Culture   Final    NO ANAEROBES ISOLATED; CULTURE IN PROGRESS FOR 5 DAYS   Report Status PENDING  Incomplete  Urine Culture     Status: None   Collection Time: 11/09/19 11:08 AM   Specimen: Urinary Bladder  Result Value Ref Range Status   Specimen Description BLADDER CYTOSCOPY  Final   Special Requests NONE  Final   Culture   Final    NO GROWTH Performed at [facility], [place] [address], [place], [place] [address]    Report Status 11/10/2019 FINAL  Final     Discharge Instructions:   Discharge Instructions    Call MD for:   Complete by: As directed    Worsening symptoms including fever, abdominal pain, urinary symptoms.   Diet - low sodium heart healthy   Complete by: As directed    Discharge instructions   Complete by: As directed    Please follow-up with your primary care physician in 1 week and check blood work at that time.  Follow-up with urology Dr. Louis Meckel in 1 to 2 weeks to discuss about kidney stones.   Increase activity slowly   Complete by: As directed      Allergies as of 11/12/2019      Reactions   Aspartame And Phenylalanine    Azithromycin Other (See Comments)   Unknown   Bee Venom Other (See Comments)   Hyper   Cortizone-5 [hydrocortisone]    Latex    Olmesartan Other (See Comments)   unknown   Other Other (See Comments)   IV dye Dyes - unknown reaction   Penicillins    Valsartan Other (See Comments)   Unknown      Medication List    TAKE these medications   aspirin 325 MG tablet Take 325 mg by mouth daily.   carvedilol 6.25 MG tablet Commonly known as: COREG Take 6.25 mg by mouth 2 (two) times daily.   furosemide 20 MG tablet Commonly known as:  LASIX Take 1 tablet (20 mg total) by mouth as needed for fluid. Start taking on: Nov 15, 2019 What changed: These instructions start on Nov 15, 2019. If you are unsure what to do until then, ask your doctor or other care provider.   magnesium oxide 400 (241.3 Mg) MG tablet Commonly known as: MAG-OX Take 1 tablet (400 mg total) by mouth 2 (two) times daily.   meclizine 25 MG tablet Commonly known as: ANTIVERT Take 25 mg by mouth as needed for dizziness.   mirabegron ER 50 MG Tb24 tablet Commonly known as: Myrbetriq Take 1 tablet (50 mg total) by mouth daily.      Follow-up Information    Ardis Hughs, MD In 2 weeks.   Specialty: Urology Why: Removal of right kidney stones Contact information: [place] [address] [phone]           Time coordinating discharge: 39 minutes  Signed:  Kenzo Ozment  [facility] 11/12/2019, 10:54 AM

## 2019-11-12 NOTE — Plan of Care (Signed)

## 2019-11-12 NOTE — Evaluation (Signed)
Occupational Therapy Evaluation Patient Details Name: Gloria [name] MRN: [id number] DOB: 06/08/1946 Today's Date: 11/12/2019    History of Present Illness 74 yo female admitted with AKI, uropathy. S/P ureteral stent placement 5/8   Clinical Impression   Patient with functional deficits listed below impacting independence with self care. Patient min guard assist for functional transfer for safety and increased time for power up to standing. While brushing teeth patient incontinent of urine onto floor, patient reports at home sometimes she can make it to the bathroom sometimes not. Patient require mod A for LE bathing and total A for peri care after transferring to bedside commode.   Patient reports her primary concern is going to be managing incontinence issues at home as it seems to be worse than prior to hospitalization. Attempt to problem solve with patient use of pads (having difficulty finding in stores and online ordering can take 8-14 days) or wash clothes. Also suggest patient implement a toileting schedule to try and minimize accidents and encourage bladder routine. Recommend initial 24/7 A due to difficulty with incontinence and managing clean up/hygiene.    Follow Up Recommendations  Home health OT;Supervision/Assistance - 24 hour    Equipment Recommendations  None recommended by OT(has recommended equipment)       Precautions / Restrictions Precautions Precautions: Fall Precaution Comments: monitor O2, incontinence Restrictions Weight Bearing Restrictions: No      Mobility Bed Mobility Overal bed mobility: Needs Assistance Bed Mobility: Supine to Sit;Sit to Supine     Supine to sit: Min guard;HOB elevated Sit to supine: Min assist;HOB elevated   General bed mobility comments: min A for safety lifting LEs onto bed  Transfers Overall transfer level: Needs assistance Equipment used: Quad cane;Straight cane Transfers: Sit to/from Stand Sit to Stand: Min guard          General transfer comment: pt prefers use of canes vs walker, min guard and increased time to power up to standing    Balance Overall balance assessment: Needs assistance Sitting-balance support: No upper extremity supported;Feet supported Sitting balance-Leahy Scale: Good     Standing balance support: Bilateral upper extremity supported;During functional activity Standing balance-Leahy Scale: Fair Standing balance comment: able to maintain static balance with one upper extremity supported, increased safety with B UE support                           ADL either performed or assessed with clinical judgement   ADL Overall ADL's : Needs assistance/impaired     Grooming: Oral care;Standing;Supervision/safety   Upper Body Bathing: Set up;Sitting   Lower Body Bathing: Moderate assistance;Sitting/lateral leans   Upper Body Dressing : Set up;Sitting   Lower Body Dressing: Set up;Sit to/from stand Lower Body Dressing Details (indicate cue type and reason): to don slide on shoes Toilet Transfer: Min guard;BSC;Ambulation;Cueing for safety(cane)   Toileting- Clothing Manipulation and Hygiene: Total assistance;Sit to/from stand Toileting - Clothing Manipulation Details (indicate cue type and reason): for peri care in standing     Functional mobility during ADLs: Min guard;Cane;Cueing for safety General ADL Comments: patient requires increased assistance with self care due to decreased activity tolerance, strength     Vision Baseline Vision/History: Wears glasses Wears Glasses: At all times              Pertinent Vitals/Pain Pain Assessment: Faces Faces Pain Scale: Hurts little more Pain Location: B knees Pain Descriptors / Indicators: Aching Pain Intervention(s): Monitored during session  Hand Dominance Right   Extremity/Trunk Assessment Upper Extremity Assessment Upper Extremity Assessment: Generalized weakness   Lower Extremity Assessment Lower  Extremity Assessment: Defer to PT evaluation   Cervical / Trunk Assessment Cervical / Trunk Assessment: Kyphotic   Communication Communication Communication: HOH;Other (comment)(reads lips)   Cognition Arousal/Alertness: Awake/alert Behavior During Therapy: WFL for tasks assessed/performed Overall Cognitive Status: Difficult to assess                                 General Comments: follows directions appropriately              Home Living Family/patient expects to be discharged to:: Private residence Living Arrangements: Alone Available Help at Discharge: Family;Available PRN/intermittently(brother lives next door) Type of Home: House       Home Layout: One level     Bathroom Shower/Tub: Other (comment)(does not use)         Home Equipment: Cane - quad;Cane - single point;Bedside commode;Walker - 2 wheels          Prior Functioning/Environment Level of Independence: Independent with assistive device(s)        Comments: walks with a cane, sponge bathes only        OT Problem List: Decreased strength;Decreased activity tolerance;Impaired balance (sitting and/or standing);Decreased safety awareness      OT Treatment/Interventions: Self-care/ADL training;Therapeutic exercise;Energy conservation;DME and/or AE instruction;Therapeutic activities;Patient/family education;Balance training    OT Goals(Current goals can be found in the care plan section) Acute Rehab OT Goals Patient Stated Goal: home. OT Goal Formulation: With patient Time For Goal Achievement: 11/26/19 Potential to Achieve Goals: Good  OT Frequency: Min 2X/week    AM-PAC OT "6 Clicks" Daily Activity     Outcome Measure Help from another person eating meals?: None Help from another person taking care of personal grooming?: A Little Help from another person toileting, which includes using toliet, bedpan, or urinal?: A Lot Help from another person bathing (including washing,  rinsing, drying)?: A Lot Help from another person to put on and taking off regular upper body clothing?: A Little Help from another person to put on and taking off regular lower body clothing?: A Little 6 Click Score: 17   End of Session Equipment Utilized During Treatment: Other (comment)(quad cane, straight cane) Nurse Communication: Mobility status  Activity Tolerance: Treatment limited secondary to medical complications (Comment)(urinary incontinence) Patient left: in bed;with call bell/phone within reach  OT Visit Diagnosis: Unsteadiness on feet (R26.81);Other abnormalities of gait and mobility (R26.89)                Time: [id number] OT Time Calculation (min): 46 min Charges:  OT General Charges $OT Visit: 1 Visit OT Evaluation $OT Eval Moderate Complexity: 1 Mod OT Treatments $Self Care/Home Management : 23-37 mins  Delbert Phenix OT Pager: [phone]  Rosemary Holms 11/12/2019, 11:57 AM

## 2019-11-12 NOTE — TOC Transition Note (Signed)
Transition of Care [facility]) - CM/SW Discharge Note   Patient Details  Name: Gloria [name] MRN: [id number] Date of Birth: [birth year]  [facility] of [facility]) CM/SW Contact:  Ross Ludwig, LCSW Phone Number: 11/12/2019, 2:11 PM   Clinical Narrative:     Patient will be going home with home health through [facility].  CSW signing off please reconsult with any other social work needs, home health agency has been notified of planned discharge.  Patient's brother will be transporting her home.    Final next level of care: [place] Barriers to Discharge: Barriers Resolved   Patient Goals and CMS Choice Patient states their goals for this hospitalization and ongoing recovery are:: To return back home with home health services.      Discharge Placement     Home with home health PT and OT.                  Discharge Plan and Services     Post Acute Care Choice: Home Health                    HH Arranged: OT, PT [facility] Agency: [place] ([facility]) Date [place]: 11/12/19 Time [place]: [place] Representative spoke with at [place]: [place] ([place]) Interventions     Readmission Risk Interventions No flowsheet data found.

## 2019-11-13 NOTE — Patient Instructions (Addendum)
DUE TO COVID-19 ONLY ONE VISITOR IS ALLOWED TO COME WITH YOU AND STAY IN THE WAITING ROOM ONLY DURING PRE OP AND PROCEDURE DAY OF SURGERY. THE 1 VISITOR MAY VISIT WITH YOU AFTER SURGERY IN YOUR PRIVATE ROOM DURING VISITING HOURS ONLY!  YOU NEED TO HAVE A COVID 19 TEST ON 11-19-19 @ 9:15 AM, THIS TEST MUST BE DONE BEFORE SURGERY, COME  [place], [place] , [address].  ([place]) ONCE YOUR COVID TEST IS COMPLETED, PLEASE BEGIN THE QUARANTINE INSTRUCTIONS AS OUTLINED IN YOUR HANDOUT.                Gloria [name]  11/13/2019   Your procedure is scheduled on: 11-22-19   Report to [facility] Main  Entrance    Report to Admitting at 10:15 AM     Call this number if you have problems the morning of surgery [phone]    Remember: Do not eat food or drink liquids :After Midnight.   BRUSH YOUR TEETH MORNING OF SURGERY AND RINSE YOUR MOUTH OUT, NO CHEWING GUM CANDY OR MINTS.     Take these medicines the morning of surgery with A SIP OF WATER: Carvedilol (Coreg)                                You may not have any metal on your body including hair pins and              piercings     Do not wear jewelry, make-up, lotions, powders or perfumes, deodorant              Do not wear nail polish on your fingernails.  Do not shave  48 hours prior to surgery.      Do not bring valuables to the hospital. [place].  Contacts, dentures or bridgework may not be worn into surgery.     Patients discharged the day of surgery will not be allowed to drive home. IF YOU ARE HAVING SURGERY AND GOING HOME THE SAME DAY, YOU MUST HAVE AN ADULT TO DRIVE YOU HOME AND BE WITH YOU FOR 24 HOURS. YOU MAY GO HOME BY TAXI OR UBER OR ORTHERWISE, BUT AN ADULT MUST ACCOMPANY YOU HOME AND STAY WITH YOU FOR 24 HOURS.  Name and phone number of your driver:[name]) [phone]  Special Instructions: N/A   Please read over the following fact sheets you were given: _____________________________________________________________________             [facility] - Preparing for Surgery Before surgery, you can play an important role.  Because skin is not sterile, your skin needs to be as free of germs as possible.  You can reduce the number of germs on your skin by washing with CHG (chlorahexidine gluconate) soap before surgery.  CHG is an antiseptic cleaner which kills germs and bonds with the skin to continue killing germs even after washing. Please DO NOT use if you have an allergy to CHG or antibacterial soaps.  If your skin becomes reddened/irritated stop using the CHG and inform your nurse when you arrive at Short Stay. Do not shave (including legs and underarms) for at least 48 hours prior to the first CHG shower.  You may shave your face/neck. Please follow these instructions carefully:  1.  Shower with CHG Soap the night before surgery and the  morning of Surgery.  2.  If you choose to wash your hair, wash your hair first as usual with your  normal  shampoo.  3.  After you shampoo, rinse your hair and body thoroughly to remove the  shampoo.                           4.  Use CHG as you would any other liquid soap.  You can apply chg directly  to the skin and wash                       Gently with a scrungie or clean washcloth.  5.  Apply the CHG Soap to your body ONLY FROM THE NECK DOWN.   Do not use on face/ open                           Wound or open sores. Avoid contact with eyes, ears mouth and genitals (private parts).                       Wash face,  Genitals (private parts) with your normal soap.             6.  Wash thoroughly, paying special attention to the area where your surgery  will be performed.  7.  Thoroughly rinse your body with warm water from the neck down.  8.  DO NOT shower/wash with your normal soap after using and rinsing off  the CHG Soap.                9.  Pat  yourself dry with a clean towel.            10.  Wear clean pajamas.            11.  Place clean sheets on your bed the night of your first shower and do not  sleep with pets. Day of Surgery : Do not apply any lotions/deodorants the morning of surgery.  Please wear clean clothes to the hospital/surgery center.  FAILURE TO FOLLOW THESE INSTRUCTIONS MAY RESULT IN THE CANCELLATION OF YOUR SURGERY PATIENT SIGNATURE_________________________________  NURSE SIGNATURE__________________________________  ________________________________________________________________________

## 2019-11-13 NOTE — Progress Notes (Addendum)
PCP - Maryella Shivers, MD Cardiologist -  No stimulator   Chest x-ray -  EKG - 11-11-19 Stress Test -  ECHO - 11-09-19 Cardiac Cath -   Sleep Study -  CPAP -   Fasting Blood Sugar -  Checks Blood Sugar _____ times a day  Blood Thinner Instructions:  Aspirin Instructions: 325 mg ASA. Pt to hold x 5 days.  Last Dose:11-17-19  Anesthesia review:   Patient denies shortness of breath, fever, cough and chest pain at PAT appointment   Patient verbalized understanding of instructions that were given to them at the PAT appointment. Patient was also instructed that they will need to review over the PAT instructions again at home before surgery.

## 2019-11-14 LAB — ANAEROBIC CULTURE

## 2019-11-18 ENCOUNTER — Other Ambulatory Visit: Payer: Self-pay

## 2019-11-18 ENCOUNTER — Encounter (HOSPITAL_COMMUNITY)
Admission: RE | Admit: 2019-11-18 | Discharge: 2019-11-18 | Disposition: A | Discharge status: Home / Self Care | Payer: PPO | Source: Ambulatory Visit | Attending: Urology | Admitting: Urology

## 2019-11-18 ENCOUNTER — Encounter (HOSPITAL_COMMUNITY): Payer: Self-pay

## 2019-11-18 DIAGNOSIS — Z01812 Encounter for preprocedural laboratory examination: Secondary | ICD-10-CM | POA: Insufficient documentation

## 2019-11-19 ENCOUNTER — Other Ambulatory Visit (HOSPITAL_COMMUNITY)
Admission: RE | Admit: 2019-11-19 | Discharge: 2019-11-19 | Disposition: A | Discharge status: Home / Self Care | Payer: PPO | Source: Ambulatory Visit | Attending: Urology | Admitting: Urology

## 2019-11-19 ENCOUNTER — Other Ambulatory Visit: Payer: Self-pay

## 2019-11-19 ENCOUNTER — Encounter (HOSPITAL_COMMUNITY)
Admission: RE | Admit: 2019-11-19 | Discharge: 2019-11-19 | Disposition: A | Discharge status: Home / Self Care | Payer: PPO | Source: Ambulatory Visit | Attending: Family Medicine | Admitting: Family Medicine

## 2019-11-19 DIAGNOSIS — Z20822 Contact with and (suspected) exposure to covid-19: Secondary | ICD-10-CM | POA: Insufficient documentation

## 2019-11-19 DIAGNOSIS — Z01812 Encounter for preprocedural laboratory examination: Secondary | ICD-10-CM | POA: Diagnosis not present

## 2019-11-19 LAB — CBC
HCT: 39.4 % (ref 36.0–46.0)
Hemoglobin: 12.3 g/dL (ref 12.0–15.0)
MCH: 31.9 pg (ref 26.0–34.0)
MCHC: 31.2 g/dL (ref 30.0–36.0)
MCV: 102.3 fL — ABNORMAL HIGH (ref 80.0–100.0)
Platelets: 189 10*3/uL (ref 150–400)
RBC: 3.85 MIL/uL — ABNORMAL LOW (ref 3.87–5.11)
RDW: 13.2 % (ref 11.5–15.5)
WBC: 3.2 10*3/uL — ABNORMAL LOW (ref 4.0–10.5)
nRBC: 0 % (ref 0.0–0.2)

## 2019-11-19 LAB — BASIC METABOLIC PANEL
Anion gap: 9 (ref 5–15)
BUN: 24 mg/dL — ABNORMAL HIGH (ref 8–23)
CO2: 27 mmol/L (ref 22–32)
Calcium: 9.1 mg/dL (ref 8.9–10.3)
Chloride: 107 mmol/L (ref 98–111)
Creatinine, Ser: 1.4 mg/dL — ABNORMAL HIGH (ref 0.44–1.00)
GFR calc Af Amer: 43 mL/min — ABNORMAL LOW (ref >60)
GFR calc non Af Amer: 37 mL/min — ABNORMAL LOW (ref >60)
Glucose, Bld: 89 mg/dL (ref 70–99)
Potassium: 3.8 mmol/L (ref 3.5–5.1)
Sodium: 143 mmol/L (ref 135–145)

## 2019-11-19 LAB — SARS CORONAVIRUS 2 (TAT 6-24 HRS): SARS Coronavirus 2: NEGATIVE

## 2019-11-21 MED ORDER — GENTAMICIN SULFATE 40 MG/ML IJ SOLN
5.0000 mg/kg | INTRAVENOUS | Status: AC
  Administered 2019-11-22: 340 mg via INTRAVENOUS
  Filled 2019-11-21: qty 8.5

## 2019-11-22 ENCOUNTER — Other Ambulatory Visit: Payer: Self-pay

## 2019-11-22 ENCOUNTER — Ambulatory Visit (HOSPITAL_COMMUNITY): Payer: PPO | Admitting: Physician Assistant

## 2019-11-22 ENCOUNTER — Ambulatory Visit (HOSPITAL_COMMUNITY): Payer: PPO | Admitting: Anesthesiology

## 2019-11-22 ENCOUNTER — Ambulatory Visit (HOSPITAL_COMMUNITY): Payer: PPO

## 2019-11-22 ENCOUNTER — Encounter (HOSPITAL_COMMUNITY)
Admission: RE | Disposition: A | Discharge status: Home / Self Care | Payer: Self-pay | Source: Ambulatory Visit | Attending: Urology

## 2019-11-22 ENCOUNTER — Encounter (HOSPITAL_COMMUNITY): Payer: Self-pay | Admitting: Urology

## 2019-11-22 ENCOUNTER — Ambulatory Visit (HOSPITAL_COMMUNITY)
Admission: RE | Admit: 2019-11-22 | Discharge: 2019-11-22 | Disposition: A | Discharge status: Home / Self Care | Payer: PPO | Source: Ambulatory Visit | Attending: Urology | Admitting: Urology

## 2019-11-22 DIAGNOSIS — H9190 Unspecified hearing loss, unspecified ear: Secondary | ICD-10-CM | POA: Diagnosis not present

## 2019-11-22 DIAGNOSIS — Z79899 Other long term (current) drug therapy: Secondary | ICD-10-CM | POA: Diagnosis not present

## 2019-11-22 DIAGNOSIS — Z7982 Long term (current) use of aspirin: Secondary | ICD-10-CM | POA: Diagnosis not present

## 2019-11-22 DIAGNOSIS — N200 Calculus of kidney: Secondary | ICD-10-CM | POA: Diagnosis not present

## 2019-11-22 DIAGNOSIS — I4891 Unspecified atrial fibrillation: Secondary | ICD-10-CM | POA: Insufficient documentation

## 2019-11-22 DIAGNOSIS — N132 Hydronephrosis with renal and ureteral calculous obstruction: Secondary | ICD-10-CM | POA: Diagnosis not present

## 2019-11-22 DIAGNOSIS — N179 Acute kidney failure, unspecified: Secondary | ICD-10-CM | POA: Diagnosis not present

## 2019-11-22 DIAGNOSIS — N139 Obstructive and reflux uropathy, unspecified: Secondary | ICD-10-CM

## 2019-11-22 DIAGNOSIS — E875 Hyperkalemia: Secondary | ICD-10-CM | POA: Diagnosis not present

## 2019-11-22 HISTORY — PX: CYSTOSCOPY/URETEROSCOPY/HOLMIUM LASER/STENT PLACEMENT: SHX6546

## 2019-11-22 SURGERY — CYSTOSCOPY/URETEROSCOPY/HOLMIUM LASER/STENT PLACEMENT
Anesthesia: General | Laterality: Right

## 2019-11-22 MED ORDER — FENTANYL CITRATE (PF) 100 MCG/2ML IJ SOLN
INTRAMUSCULAR | Status: DC | PRN
  Administered 2019-11-22: 50 ug via INTRAVENOUS

## 2019-11-22 MED ORDER — TRAMADOL HCL 50 MG PO TABS: 50.0000 mg | ORAL_TABLET | Freq: Four times a day (QID) | ORAL | 0 refills | Status: DC | PRN

## 2019-11-22 MED ORDER — LACTATED RINGERS IV SOLN: INTRAVENOUS | Status: DC

## 2019-11-22 MED ORDER — LIDOCAINE 2% (20 MG/ML) 5 ML SYRINGE
INTRAMUSCULAR | Status: DC | PRN
  Administered 2019-11-22: 90 mg via INTRAVENOUS

## 2019-11-22 MED ORDER — ONDANSETRON HCL 4 MG/2ML IJ SOLN
INTRAMUSCULAR | Status: DC | PRN
  Administered 2019-11-22: 4 mg via INTRAVENOUS

## 2019-11-22 MED ORDER — FENTANYL CITRATE (PF) 100 MCG/2ML IJ SOLN
INTRAMUSCULAR | Status: AC
  Filled 2019-11-22: qty 2

## 2019-11-22 MED ORDER — DEXAMETHASONE SODIUM PHOSPHATE 10 MG/ML IJ SOLN
INTRAMUSCULAR | Status: DC | PRN
  Administered 2019-11-22: 5 mg via INTRAVENOUS

## 2019-11-22 MED ORDER — CHLORHEXIDINE GLUCONATE 0.12 % MT SOLN
15.0000 mL | Freq: Once | OROMUCOSAL | Status: AC
  Administered 2019-11-22: 15 mL via OROMUCOSAL

## 2019-11-22 MED ORDER — PROPOFOL 10 MG/ML IV BOLUS
INTRAVENOUS | Status: AC
  Filled 2019-11-22: qty 20

## 2019-11-22 MED ORDER — FENTANYL CITRATE (PF) 100 MCG/2ML IJ SOLN
25.0000 ug | INTRAMUSCULAR | Status: DC | PRN
  Administered 2019-11-22: 25 ug via INTRAVENOUS
  Administered 2019-11-22: 50 ug via INTRAVENOUS
  Administered 2019-11-22: 25 ug via INTRAVENOUS

## 2019-11-22 MED ORDER — ACETAMINOPHEN 500 MG PO TABS
1000.0000 mg | ORAL_TABLET | Freq: Once | ORAL | Status: AC
  Administered 2019-11-22: 1000 mg via ORAL
  Filled 2019-11-22: qty 2

## 2019-11-22 MED ORDER — IOHEXOL 300 MG/ML  SOLN
INTRAMUSCULAR | Status: DC | PRN
  Administered 2019-11-22: 9 mL via URETHRAL

## 2019-11-22 MED ORDER — PHENAZOPYRIDINE HCL 200 MG PO TABS: 200.0000 mg | ORAL_TABLET | Freq: Three times a day (TID) | ORAL | 0 refills | Status: DC | PRN

## 2019-11-22 MED ORDER — PROPOFOL 10 MG/ML IV BOLUS
INTRAVENOUS | Status: DC | PRN
  Administered 2019-11-22: 150 mg via INTRAVENOUS

## 2019-11-22 MED ORDER — MIRABEGRON ER 25 MG PO TB24: 25.0000 mg | ORAL_TABLET | Freq: Every day | ORAL | Status: DC

## 2019-11-22 SURGICAL SUPPLY — 20 items
BAG URO CATCHER STRL LF (MISCELLANEOUS) ×3 IMPLANT
BASKET ZERO TIP NITINOL 2.4FR (BASKET) IMPLANT
CATH URET 5FR 28IN OPEN ENDED (CATHETERS) ×3 IMPLANT
CATH URET DUAL LUMEN 6-10FR 50 (CATHETERS) ×3 IMPLANT
CLOTH BEACON ORANGE TIMEOUT ST (SAFETY) ×3 IMPLANT
EXTRACTOR STONE 1.7FRX115CM (UROLOGICAL SUPPLIES) ×3 IMPLANT
FIBER LASER TRAC TIP (UROLOGICAL SUPPLIES) ×3 IMPLANT
GLOVE BIOGEL M STRL SZ7.5 (GLOVE) ×3 IMPLANT
GOWN STRL REUS W/TWL XL LVL3 (GOWN DISPOSABLE) ×6 IMPLANT
GUIDEWIRE ANG ZIPWIRE 038X150 (WIRE) IMPLANT
GUIDEWIRE STR DUAL SENSOR (WIRE) ×6 IMPLANT
KIT TURNOVER KIT A (KITS) ×3 IMPLANT
MANIFOLD NEPTUNE II (INSTRUMENTS) ×3 IMPLANT
SHEATH URETERAL 12FRX28CM (UROLOGICAL SUPPLIES) ×3 IMPLANT
SHEATH URETERAL 12FRX35CM (MISCELLANEOUS) IMPLANT
STENT URET 6FRX24 CONTOUR (STENTS) ×3 IMPLANT
TRAY CYSTO PACK (CUSTOM PROCEDURE TRAY) ×3 IMPLANT
TUBING CONNECTING 10 (TUBING) ×2 IMPLANT
TUBING CONNECTING 10' (TUBING) ×1
TUBING UROLOGY SET (TUBING) ×3 IMPLANT

## 2019-11-22 NOTE — Anesthesia Procedure Notes (Signed)
Procedure Name: LMA Insertion Performed by: Kaetlyn Noa J, CRNA Pre-anesthesia Checklist: Patient identified, Emergency Drugs available, Suction available, Patient being monitored and Timeout performed Patient Re-evaluated:Patient Re-evaluated prior to induction Oxygen Delivery Method: Circle system utilized Preoxygenation: Pre-oxygenation with 100% oxygen Induction Type: IV induction Ventilation: Mask ventilation without difficulty LMA: LMA inserted LMA Size: 4.0 Number of attempts: 1 Placement Confirmation: positive ETCO2 and breath sounds checked- equal and bilateral Tube secured with: Tape Dental Injury: Teeth and Oropharynx as per pre-operative assessment        

## 2019-11-22 NOTE — Transfer of Care (Signed)
Immediate Anesthesia Transfer of Care Note  Patient: Gloria [name]  Procedure(s) Performed: CYSTOSCOPY RIGHT URETEROSCOPY/HOLMIUM LASER STONE EXTRACTION /STENT EXCHANGE (Right )  Patient Location: PACU  Anesthesia Type:General  Level of Consciousness: sedated, patient cooperative and responds to stimulation  Airway & Oxygen Therapy: Patient Spontanous Breathing and Patient connected to face mask oxygen  Post-op Assessment: Report given to RN and Post -op Vital signs reviewed and stable  Post vital signs: Reviewed and stable  Last Vitals:  Vitals Value Taken Time  BP 172/76 11/22/19 1435  Temp    Pulse 48 11/22/19 1437  Resp 7 11/22/19 1437  SpO2 99 % 11/22/19 1437  Vitals shown include unvalidated device data.  Last Pain:  Vitals:   11/22/19 1040  TempSrc: Oral  PainSc:       Patients Stated Pain Goal: 7 ([id number])  Complications: No apparent anesthesia complications

## 2019-11-22 NOTE — Interval H&P Note (Signed)
History and Physical Interval Note:  11/22/2019 12:19 PM  Gloria [name]  has presented today for surgery, with the diagnosis of [place].  The various methods of treatment have been discussed with the patient and family. After consideration of risks, benefits and other options for treatment, the patient has consented to  Procedure(s): CYSTOSCOPY RIGHT URETEROSCOPY/HOLMIUM LASER STONE EXTRACTION /STENT EXCHANGE (Right) as a surgical intervention.  The patient's history has been reviewed, patient examined, no change in status, stable for surgery.  I have reviewed the patient's chart and labs.  Questions were answered to the patient's satisfaction.     Ardis Hughs

## 2019-11-22 NOTE — Progress Notes (Signed)
Pt discharged in NAD, VSS, pain tolerable. Her brother received discharge instructions via telephone. Pt given back all belongings. Pt discharged by wheelchair home with brother.

## 2019-11-22 NOTE — Interval H&P Note (Signed)
History and Physical Interval Note:  11/22/2019 12:17 PM  Gloria [name]  has presented today for surgery, with the diagnosis of [place].  The various methods of treatment have been discussed with the patient and family. After consideration of risks, benefits and other options for treatment, the patient has consented to  Procedure(s): CYSTOSCOPY RIGHT URETEROSCOPY/HOLMIUM LASER STONE EXTRACTION /STENT EXCHANGE (Right) as a surgical intervention.  The patient's history has been reviewed, patient examined, no change in status, stable for surgery.  I have reviewed the patient's chart and labs.  Questions were answered to the patient's satisfaction.     Ardis Hughs

## 2019-11-22 NOTE — Anesthesia Preprocedure Evaluation (Addendum)
Anesthesia Evaluation  Patient identified by MRN, date of birth, ID band Patient awake    Reviewed: Allergy & Precautions, NPO status , Patient's Chart, lab work & pertinent test results, reviewed documented beta blocker date and time   Airway Mallampati: III  TM Distance: >3 FB Neck ROM: Full    Dental no notable dental hx. (+) Teeth Intact, Dental Advisory Given   Pulmonary neg pulmonary ROS,    Pulmonary exam normal breath sounds clear to auscultation       Cardiovascular Normal cardiovascular exam+ dysrhythmias Atrial Fibrillation  Rhythm:Regular Rate:Normal  TTE 11/2019 1. Left ventricular ejection fraction, by estimation, is 60 to 65%. The left ventricle has normal function. The left ventricle has no regional wall motion abnormalities. There is severe left ventricular hypertrophy. Left ventricular diastolic function could not be evaluated.  2. Right ventricular systolic function is normal. The right ventricular size is moderately enlarged. There is moderately elevated pulmonary artery systolic pressure. The estimated right ventricular systolic pressure is 99991111 mmHg.  3. Left atrial size was severely dilated.  4. Right atrial size was mildly dilated.  5. The mitral valve is normal in structure. Mild mitral valve  regurgitation.  6. Tricuspid valve regurgitation is moderate.  7. The aortic valve is tricuspid. Aortic valve regurgitation is mild. No aortic stenosis is present.  8. Aortic dilatation noted. 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  9. The inferior vena cava is dilated in size with <50% respiratory variability, suggesting right atrial pressure of 15 mmHg.    Neuro/Psych PSYCHIATRIC DISORDERS Anxiety negative neurological ROS     GI/Hepatic negative GI ROS, Neg liver ROS,   Endo/Other  negative endocrine ROS  Renal/GU Renal InsufficiencyRenal disease (K 3.8, Cr 1.40)  negative  genitourinary   Musculoskeletal negative musculoskeletal ROS (+)   Abdominal   Peds  Hematology negative hematology ROS (+)   Anesthesia Other Findings   Reproductive/Obstetrics                            Anesthesia Physical Anesthesia Plan  ASA: III  Anesthesia Plan: General   Post-op Pain Management:    Induction: Intravenous  PONV Risk Score and Plan: 3 and Ondansetron, Dexamethasone and Treatment may vary due to age or medical condition  Airway Management Planned: LMA  Additional Equipment:   Intra-op Plan:   Post-operative Plan: Extubation in OR  Informed Consent: I have reviewed the patients History and Physical, chart, labs and discussed the procedure including the risks, benefits and alternatives for the proposed anesthesia with the patient or authorized representative who has indicated his/her understanding and acceptance.     Dental advisory given  Plan Discussed with: CRNA  Anesthesia Plan Comments:         Anesthesia Quick Evaluation

## 2019-11-22 NOTE — Discharge Instructions (Signed)
DISCHARGE INSTRUCTIONS FOR KIDNEY STONE/URETERAL STENT   MEDICATIONS:  1.  Resume all your other meds from home - except do not take any extra narcotic pain meds that you may have at home.  2. Pyridium is to help with the burning/stinging when you urinate. 3. Tramadol is for moderate/severe pain, otherwise taking upto 1000 mg every 6 hours of plainTylenol will help treat your pain.     ACTIVITY:  1. No strenuous activity x 1week  2. No driving while on narcotic pain medications  3. Drink plenty of water  4. Continue to walk at home - you can still get blood clots when you are at home, so keep active, but don't over do it.  5. May return to work/school tomorrow or when you feel ready   BATHING:  1. You can shower and we recommend daily showers   SIGNS/SYMPTOMS TO CALL:  Please call us if you have a fever greater than 101.5, uncontrolled nausea/vomiting, uncontrolled pain, dizziness, unable to urinate, bloody urine, chest pain, shortness of breath, leg swelling, leg pain, redness around wound, drainage from wound, or any other concerns or questions.   You can reach [place] at [phone].   FOLLOW-UP:  1. You have an appointment in 2 weeks for repeat ureteroscopy of the left kidney.

## 2019-11-22 NOTE — Anesthesia Postprocedure Evaluation (Signed)
Anesthesia Post Note  Patient: Gloria [name]  Procedure(s) Performed: CYSTOSCOPY RIGHT URETEROSCOPY/HOLMIUM LASER STONE EXTRACTION /STENT EXCHANGE (Right )     Patient location during evaluation: PACU Anesthesia Type: General Level of consciousness: awake Pain management: pain level controlled Vital Signs Assessment: post-procedure vital signs reviewed and stable Respiratory status: spontaneous breathing Cardiovascular status: stable Postop Assessment: no apparent nausea or vomiting Anesthetic complications: no    Last Vitals:  Vitals:   11/22/19 1040  BP: (!) 167/73  Pulse: (!) 48  Resp: 18  Temp: (!) 36.4 C  SpO2: 100%    Last Pain:  Vitals:   11/22/19 1040  TempSrc: Oral  PainSc:                  Ernestine Rohman

## 2019-11-25 NOTE — Op Note (Signed)
Preoperative diagnosis:  1. Bilateral kidney stones   Postoperative diagnosis:  1. same   Procedure: 1. Right retrograde pyelogram with interpretation 2. Right ureteroscopy, laser lithotripsy, stone extraction 3. Right ureteral stent exchange  Surgeon: Ardis Hughs, MD  Anesthesia: General  Complications: None  Intraoperative findings:  #1: Retrograde pyelogram demonstrated a normal caliber ureter with a large lower pole filling defect consistent with the patient's known stone.   There were no other defects or abnormalities. #2: Large stone burden in lower pole, I removed some of the smaller stones and moved those that I could manipulate into the upper pole.  There was a large lower pole stone in which the angle was to sharp to basket.  I lasered the stones in the upper pole into small fragments and worked on the lower pole stone at a difficult angle.  I was unable to fragment this stone completely. #3: Given the stone burden and difficult angle I opted to stage this side and placed a 24cm x 6 F double J stent.  EBL: Minimal  Specimens: None  Indication: Gloria [name] is a 74 y.o. patient with bilateral ureteral stones - she was stented 10 days prior.  After reviewing the management options for treatment, he elected to proceed with the above surgical procedure(s). We have discussed the potential benefits and risks of the procedure, side effects of the proposed treatment, the likelihood of the patient achieving the goals of the procedure, and any potential problems that might occur during the procedure or recuperation. Informed consent has been obtained.  Description of procedure:  The patient was taken to the operating room and general anesthesia was induced.  The patient was placed in the dorsal lithotomy position, prepped and draped in the usual sterile fashion, and preoperative antibiotics were administered. A preoperative time-out was performed.   W/ a 36F cystoscope  and 30 deg lens I entered the patient's bladder and grasped the stent emanating from her right UO.  I pulled it to the urethral meatus and then advanced a wire through the stent and up to right renal pelvis.  I then removed the stent and exchanged it for a dual lumen catheter.  This was advanced to the right UVJ and a retrograde pyelogram was performed with the above findings.  I advanced a second wire and then advanced a 28cm 12/59F ureteral access sheath up to the proximal right ureter.  Using the dual lumen flexible ureteroscope I nagivated the renal pelvis and noted no stones in the upper pole and a large stone burden in the right lower pole.  I removed some of the smaller stones and then fragmented the larger stones so that they could be easily basketed and relocated to the upper pole.  I was unable to remove the largest stone which was in the most inferior and posterior calyx, and I fragmented this stone as much as it would allow.  I turned my attention to the upper pole and the stones that I had moved there.  I dusted these stones as much as my visualization would allow.  I then opted given the amount of stone remaining to stage this procedure and replaced a 24cmx6F stent through the cystoscope using fluoro in the routine standard fashion.  The ureter had minimal trauma as I backed the sheath and the flexible scope out simultaneously.    No stent tether was left.  The bladder was drained and the patient awoken from anesthesia.  She was returned to the  PACU in good condition.  The patient will be scheduled for bilateral ureteroscopy on her subsequent f/u to finish removing her stones on the right and start her left stone extraction process.    Ardis Hughs, M.D.

## 2019-11-26 DIAGNOSIS — N202 Calculus of kidney with calculus of ureter: Secondary | ICD-10-CM | POA: Diagnosis not present

## 2019-11-29 NOTE — Patient Instructions (Addendum)
DUE TO COVID-19 ONLY ONE VISITOR IS ALLOWED TO COME WITH YOU AND STAY IN THE WAITING ROOM ONLY DURING PRE OP AND PROCEDURE DAY OF SURGERY. THE 1 VISITOR MAY VISIT WITH YOU AFTER SURGERY IN YOUR PRIVATE ROOM DURING VISITING HOURS ONLY!  YOU NEED TO HAVE A COVID 19 TEST ON__6/1_____ @_2 :15______, THIS TEST MUST BE DONE BEFORE SURGERY, COME  [address], [place] , [address].  ([place]) ONCE YOUR COVID TEST IS COMPLETED, PLEASE BEGIN THE QUARANTINE INSTRUCTIONS AS OUTLINED IN YOUR HANDOUT.                Gloria [name]    Your procedure is scheduled on: 12/06/19   Report to [facility] Main  Entrance   Report to admitting at  7:30 AM     Call this number if you have problems the morning of surgery [phone]    Remember: Do not eat food or drink liquids :After Midnight.   BRUSH YOUR TEETH MORNING OF SURGERY AND RINSE YOUR MOUTH OUT, NO CHEWING GUM CANDY OR MINTS.     Take these medicines the morning of surgery with A SIP OF WATER: Coreg, Myrbetriq                                 You may not have any metal on your body including hair pins and              piercings  Do not wear jewelry, make-up, lotions, powders or perfumes, deodorant             Do not wear nail polish on your fingernails.  Do not shave  48 hours prior to surgery.            Do not bring valuables to the hospital. [facility].  Contacts, dentures or bridgework may not be worn into surgery.      Patients discharged the day of surgery will not be allowed to drive home  . IF YOU ARE HAVING SURGERY AND GOING HOME THE SAME DAY, YOU MUST HAVE AN ADULT TO DRIVE YOU HOME AND BE WITH YOU FOR 24 HOURS.   YOU MAY GO HOME BY TAXI OR UBER OR ORTHERWISE, BUT AN ADULT MUST ACCOMPANY YOU HOME AND STAY WITH YOU FOR 24 HOURS.  Name and phone number of your [name]:  Special Instructions: N/A              Please read over the following fact  sheets you were given: _____________________________________________________________________             [facility] - Preparing for Surgery Before surgery, you can play an important role.   Because skin is not sterile, your skin needs to be as free of germs as possible.   You can reduce the number of germs on your skin by washing with CHG (chlorahexidine gluconate) soap before surgery.   CHG is an antiseptic cleaner which kills germs and bonds with the skin to continue killing germs even after washing. Please DO NOT use if you have an allergy to CHG or antibacterial soaps .  If your skin becomes reddened/irritated stop using the CHG and inform your nurse when you arrive at Short Stay. Do not shave (including legs and underarms) for at least 48 hours prior to  the first CHG shower.    Please follow these instructions carefully:  1.  Shower with CHG Soap the night before surgery and the  morning of Surgery.  2.  If you choose to wash your hair, wash your hair first as usual with your  normal  shampoo.  3.  After you shampoo, rinse your hair and body thoroughly to remove the  shampoo.                                        4.  Use CHG as you would any other liquid soap.  You can apply chg directly  to the skin and wash                       Gently with a scrungie or clean washcloth.  5.  Apply the CHG Soap to your body ONLY FROM THE NECK DOWN.   Do not use on face/ open                           Wound or open sores. Avoid contact with eyes, ears mouth and genitals (private parts).                       Wash face,  Genitals (private parts) with your normal soap.             6.  Wash thoroughly, paying special attention to the area where your surgery  will be performed.  7.  Thoroughly rinse your body with warm water from the neck down.  8.  DO NOT shower/wash with your normal soap after using and rinsing off  the CHG Soap.             9.  Pat yourself dry with a clean towel.            10.   Wear clean pajamas.            11.  Place clean sheets on your bed the night of your first shower and do not  sleep with pets. Day of Surgery : Do not apply any lotions/deodorants the morning of surgery.  Please wear clean clothes to the hospital/surgery center.  FAILURE TO FOLLOW THESE INSTRUCTIONS MAY RESULT IN THE CANCELLATION OF YOUR SURGERY PATIENT SIGNATURE_________________________________  NURSE SIGNATURE__________________________________  ________________________________________________________________________

## 2019-12-02 DIAGNOSIS — E876 Hypokalemia: Secondary | ICD-10-CM | POA: Diagnosis not present

## 2019-12-02 DIAGNOSIS — I10 Essential (primary) hypertension: Secondary | ICD-10-CM | POA: Diagnosis not present

## 2019-12-02 DIAGNOSIS — Z6841 Body Mass Index (BMI) 40.0 and over, adult: Secondary | ICD-10-CM | POA: Diagnosis not present

## 2019-12-02 DIAGNOSIS — N183 Chronic kidney disease, stage 3 unspecified: Secondary | ICD-10-CM | POA: Diagnosis not present

## 2019-12-03 ENCOUNTER — Other Ambulatory Visit (HOSPITAL_COMMUNITY)
Admission: RE | Admit: 2019-12-03 | Discharge: 2019-12-03 | Disposition: A | Discharge status: Home / Self Care | Payer: PPO | Source: Ambulatory Visit | Attending: Urology | Admitting: Urology

## 2019-12-03 ENCOUNTER — Encounter (HOSPITAL_COMMUNITY)
Admission: RE | Admit: 2019-12-03 | Discharge: 2019-12-03 | Disposition: A | Discharge status: Home / Self Care | Payer: PPO | Source: Ambulatory Visit | Attending: Urology | Admitting: Urology

## 2019-12-03 ENCOUNTER — Encounter (HOSPITAL_COMMUNITY): Payer: Self-pay

## 2019-12-03 ENCOUNTER — Other Ambulatory Visit: Payer: Self-pay

## 2019-12-03 ENCOUNTER — Encounter (HOSPITAL_COMMUNITY): Payer: PPO

## 2019-12-03 DIAGNOSIS — Z20822 Contact with and (suspected) exposure to covid-19: Secondary | ICD-10-CM | POA: Insufficient documentation

## 2019-12-03 DIAGNOSIS — Z01812 Encounter for preprocedural laboratory examination: Secondary | ICD-10-CM | POA: Insufficient documentation

## 2019-12-03 HISTORY — DX: Cardiac arrhythmia, unspecified: I49.9

## 2019-12-03 HISTORY — DX: Essential (primary) hypertension: I10

## 2019-12-03 HISTORY — DX: Personal history of urinary calculi: Z87.442

## 2019-12-03 LAB — CBC
HCT: 39.6 % (ref 36.0–46.0)
Hemoglobin: 12.3 g/dL (ref 12.0–15.0)
MCH: 31.7 pg (ref 26.0–34.0)
MCHC: 31.1 g/dL (ref 30.0–36.0)
MCV: 102.1 fL — ABNORMAL HIGH (ref 80.0–100.0)
Platelets: 208 10*3/uL (ref 150–400)
RBC: 3.88 MIL/uL (ref 3.87–5.11)
RDW: 13.8 % (ref 11.5–15.5)
WBC: 3.2 10*3/uL — ABNORMAL LOW (ref 4.0–10.5)
nRBC: 0 % (ref 0.0–0.2)

## 2019-12-03 NOTE — Progress Notes (Signed)
COVID Vaccine Completed:No Date COVID Vaccine completed: COVID vaccine manufacturer: [place]   PCP - Dr. Sheral Apley Cardiologist - no  Chest x-ray - 11/09/19 EKG - 11/11/19 Stress Test - no ECHO - 11/09/19 Cardiac Cath - no  Sleep Study - no CPAP -   Fasting Blood Sugar - NA Checks Blood Sugar _____ times a day  Blood Thinner Instructions:ASA  Aspirin Instructions:Dr. Louis Meckel said to stop it 3 days prior Last Dose:12/02/19  Anesthesia review:   Patient denies shortness of breath, fever, cough and chest pain at PAT appointment yes  Patient verbalized understanding of instructions that were given to them at the PAT appointment. Patient was also instructed that they will need to review over the PAT instructions again at home before surgery. Yes

## 2019-12-04 LAB — SARS CORONAVIRUS 2 (TAT 6-24 HRS): SARS Coronavirus 2: NEGATIVE

## 2019-12-05 MED ORDER — GENTAMICIN SULFATE 40 MG/ML IJ SOLN
5.0000 mg/kg | INTRAVENOUS | Status: AC
  Administered 2019-12-06: 340 mg via INTRAVENOUS
  Filled 2019-12-05: qty 8.5

## 2019-12-06 ENCOUNTER — Other Ambulatory Visit: Payer: Self-pay

## 2019-12-06 ENCOUNTER — Ambulatory Visit (HOSPITAL_COMMUNITY): Payer: PPO

## 2019-12-06 ENCOUNTER — Ambulatory Visit (HOSPITAL_COMMUNITY)
Admission: RE | Admit: 2019-12-06 | Discharge: 2019-12-06 | Disposition: A | Discharge status: Home / Self Care | Payer: PPO | Attending: Urology | Admitting: Urology

## 2019-12-06 ENCOUNTER — Ambulatory Visit (HOSPITAL_COMMUNITY): Payer: PPO | Admitting: Registered Nurse

## 2019-12-06 ENCOUNTER — Encounter (HOSPITAL_COMMUNITY)
Admission: RE | Disposition: A | Discharge status: Home / Self Care | Payer: Self-pay | Source: Home / Self Care | Attending: Urology

## 2019-12-06 ENCOUNTER — Encounter (HOSPITAL_COMMUNITY): Payer: Self-pay | Admitting: Urology

## 2019-12-06 DIAGNOSIS — E872 Acidosis: Secondary | ICD-10-CM | POA: Insufficient documentation

## 2019-12-06 DIAGNOSIS — N201 Calculus of ureter: Secondary | ICD-10-CM | POA: Diagnosis not present

## 2019-12-06 DIAGNOSIS — D3501 Benign neoplasm of right adrenal gland: Secondary | ICD-10-CM | POA: Diagnosis not present

## 2019-12-06 DIAGNOSIS — N3281 Overactive bladder: Secondary | ICD-10-CM | POA: Diagnosis not present

## 2019-12-06 DIAGNOSIS — Z9049 Acquired absence of other specified parts of digestive tract: Secondary | ICD-10-CM | POA: Diagnosis not present

## 2019-12-06 DIAGNOSIS — Z7982 Long term (current) use of aspirin: Secondary | ICD-10-CM | POA: Diagnosis not present

## 2019-12-06 DIAGNOSIS — K439 Ventral hernia without obstruction or gangrene: Secondary | ICD-10-CM | POA: Diagnosis not present

## 2019-12-06 DIAGNOSIS — I10 Essential (primary) hypertension: Secondary | ICD-10-CM | POA: Diagnosis not present

## 2019-12-06 DIAGNOSIS — N3941 Urge incontinence: Secondary | ICD-10-CM | POA: Diagnosis not present

## 2019-12-06 DIAGNOSIS — Z79899 Other long term (current) drug therapy: Secondary | ICD-10-CM | POA: Insufficient documentation

## 2019-12-06 DIAGNOSIS — J90 Pleural effusion, not elsewhere classified: Secondary | ICD-10-CM | POA: Insufficient documentation

## 2019-12-06 DIAGNOSIS — K5730 Diverticulosis of large intestine without perforation or abscess without bleeding: Secondary | ICD-10-CM | POA: Diagnosis not present

## 2019-12-06 DIAGNOSIS — N139 Obstructive and reflux uropathy, unspecified: Secondary | ICD-10-CM

## 2019-12-06 DIAGNOSIS — I5033 Acute on chronic diastolic (congestive) heart failure: Secondary | ICD-10-CM | POA: Diagnosis not present

## 2019-12-06 DIAGNOSIS — Z20822 Contact with and (suspected) exposure to covid-19: Secondary | ICD-10-CM | POA: Diagnosis not present

## 2019-12-06 DIAGNOSIS — I083 Combined rheumatic disorders of mitral, aortic and tricuspid valves: Secondary | ICD-10-CM | POA: Insufficient documentation

## 2019-12-06 DIAGNOSIS — E875 Hyperkalemia: Secondary | ICD-10-CM | POA: Insufficient documentation

## 2019-12-06 DIAGNOSIS — N179 Acute kidney failure, unspecified: Secondary | ICD-10-CM | POA: Diagnosis not present

## 2019-12-06 DIAGNOSIS — I700 Atherosclerosis of aorta: Secondary | ICD-10-CM | POA: Insufficient documentation

## 2019-12-06 DIAGNOSIS — F419 Anxiety disorder, unspecified: Secondary | ICD-10-CM | POA: Insufficient documentation

## 2019-12-06 DIAGNOSIS — M8580 Other specified disorders of bone density and structure, unspecified site: Secondary | ICD-10-CM | POA: Diagnosis not present

## 2019-12-06 DIAGNOSIS — D3502 Benign neoplasm of left adrenal gland: Secondary | ICD-10-CM | POA: Diagnosis not present

## 2019-12-06 DIAGNOSIS — I4891 Unspecified atrial fibrillation: Secondary | ICD-10-CM | POA: Insufficient documentation

## 2019-12-06 DIAGNOSIS — N132 Hydronephrosis with renal and ureteral calculous obstruction: Secondary | ICD-10-CM | POA: Insufficient documentation

## 2019-12-06 DIAGNOSIS — N200 Calculus of kidney: Secondary | ICD-10-CM | POA: Diagnosis not present

## 2019-12-06 HISTORY — PX: CYSTOSCOPY/URETEROSCOPY/HOLMIUM LASER/STENT PLACEMENT: SHX6546

## 2019-12-06 SURGERY — CYSTOSCOPY/URETEROSCOPY/HOLMIUM LASER/STENT PLACEMENT
Anesthesia: General | Laterality: Left

## 2019-12-06 MED ORDER — LACTATED RINGERS IV SOLN: INTRAVENOUS | Status: DC

## 2019-12-06 MED ORDER — TRAMADOL HCL 50 MG PO TABS: 50.0000 mg | ORAL_TABLET | Freq: Four times a day (QID) | ORAL | 0 refills | Status: DC | PRN

## 2019-12-06 MED ORDER — LIDOCAINE 2% (20 MG/ML) 5 ML SYRINGE
INTRAMUSCULAR | Status: AC
  Filled 2019-12-06: qty 5

## 2019-12-06 MED ORDER — CHLORHEXIDINE GLUCONATE 0.12 % MT SOLN
15.0000 mL | Freq: Once | OROMUCOSAL | Status: AC
  Administered 2019-12-06: 15 mL via OROMUCOSAL

## 2019-12-06 MED ORDER — ORAL CARE MOUTH RINSE: 15.0000 mL | Freq: Once | OROMUCOSAL | Status: AC

## 2019-12-06 MED ORDER — ONDANSETRON HCL 4 MG/2ML IJ SOLN
INTRAMUSCULAR | Status: AC
  Filled 2019-12-06: qty 2

## 2019-12-06 MED ORDER — BELLADONNA ALKALOIDS-OPIUM 16.2-60 MG RE SUPP
RECTAL | Status: DC | PRN
  Administered 2019-12-06: 1 via RECTAL

## 2019-12-06 MED ORDER — EPHEDRINE SULFATE-NACL 50-0.9 MG/10ML-% IV SOSY
PREFILLED_SYRINGE | INTRAVENOUS | Status: DC | PRN
  Administered 2019-12-06 (×2): 10 mg via INTRAVENOUS
  Administered 2019-12-06: 5 mg via INTRAVENOUS

## 2019-12-06 MED ORDER — LIDOCAINE 2% (20 MG/ML) 5 ML SYRINGE
INTRAMUSCULAR | Status: DC | PRN
  Administered 2019-12-06: 40 mg via INTRAVENOUS

## 2019-12-06 MED ORDER — PROPOFOL 10 MG/ML IV BOLUS
INTRAVENOUS | Status: DC | PRN
  Administered 2019-12-06: 50 mg via INTRAVENOUS
  Administered 2019-12-06: 100 mg via INTRAVENOUS

## 2019-12-06 MED ORDER — EPHEDRINE 5 MG/ML INJ
INTRAVENOUS | Status: AC
  Filled 2019-12-06: qty 10

## 2019-12-06 MED ORDER — BELLADONNA ALKALOIDS-OPIUM 16.2-30 MG RE SUPP
RECTAL | Status: AC
  Filled 2019-12-06: qty 1

## 2019-12-06 MED ORDER — SODIUM CHLORIDE 0.9 % IR SOLN
Status: DC | PRN
  Administered 2019-12-06: 3000 mL

## 2019-12-06 MED ORDER — FENTANYL CITRATE (PF) 100 MCG/2ML IJ SOLN
INTRAMUSCULAR | Status: DC | PRN
  Administered 2019-12-06 (×4): 25 ug via INTRAVENOUS

## 2019-12-06 MED ORDER — ONDANSETRON HCL 4 MG/2ML IJ SOLN
INTRAMUSCULAR | Status: DC | PRN
  Administered 2019-12-06: 4 mg via INTRAVENOUS

## 2019-12-06 MED ORDER — FENTANYL CITRATE (PF) 100 MCG/2ML IJ SOLN
INTRAMUSCULAR | Status: AC
  Filled 2019-12-06: qty 2

## 2019-12-06 MED ORDER — PHENAZOPYRIDINE HCL 200 MG PO TABS
200.0000 mg | ORAL_TABLET | Freq: Three times a day (TID) | ORAL | 0 refills | Status: DC | PRN
Start: 2019-12-06 — End: 2019-12-25

## 2019-12-06 MED ORDER — SODIUM CHLORIDE 0.9 % IV SOLN
INTRAVENOUS | Status: DC | PRN
  Administered 2019-12-06: 10 mL

## 2019-12-06 SURGICAL SUPPLY — 19 items
BAG URO CATCHER STRL LF (MISCELLANEOUS) ×3 IMPLANT
BASKET ZERO TIP NITINOL 2.4FR (BASKET) IMPLANT
CATH URET 5FR 28IN OPEN ENDED (CATHETERS) ×3 IMPLANT
CATH URET FLEX-TIP 2 LUMEN 10F (CATHETERS) ×3 IMPLANT
CLOTH BEACON ORANGE TIMEOUT ST (SAFETY) ×3 IMPLANT
EXTRACTOR STONE 1.7FRX115CM (UROLOGICAL SUPPLIES) ×3 IMPLANT
GLOVE BIOGEL M STRL SZ7.5 (GLOVE) ×3 IMPLANT
GOWN STRL REUS W/TWL XL LVL3 (GOWN DISPOSABLE) ×3 IMPLANT
GUIDEWIRE ANG ZIPWIRE 038X150 (WIRE) IMPLANT
GUIDEWIRE STR DUAL SENSOR (WIRE) ×3 IMPLANT
KIT TURNOVER KIT A (KITS) IMPLANT
MANIFOLD NEPTUNE II (INSTRUMENTS) ×3 IMPLANT
SHEATH URETERAL 12FRX28CM (UROLOGICAL SUPPLIES) IMPLANT
SHEATH URETERAL 12FRX35CM (MISCELLANEOUS) ×3 IMPLANT
STENT URET 6FRX24 CONTOUR (STENTS) ×6 IMPLANT
TRAY CYSTO PACK (CUSTOM PROCEDURE TRAY) ×3 IMPLANT
TUBING CONNECTING 10 (TUBING) ×2 IMPLANT
TUBING CONNECTING 10' (TUBING) ×1
TUBING UROLOGY SET (TUBING) ×3 IMPLANT

## 2019-12-06 NOTE — Transfer of Care (Signed)
Immediate Anesthesia Transfer of Care Note  Patient: Gloria [name]  Procedure(s) Performed: CYSTOSCOPY BILATERAL URETEROSCOPY/HOLMIUM LASER/STENT EXCHANGE (Left )  Patient Location: PACU  Anesthesia Type:General  Level of Consciousness: sedated  Airway & Oxygen Therapy: Patient Spontanous Breathing and Patient connected to face mask oxygen  Post-op Assessment: Report given to RN and Post -op Vital signs reviewed and stable  Post vital signs: Reviewed and stable  Last Vitals:  Vitals Value Taken Time  BP 108/64 12/06/19 1146  Temp    Pulse 61 12/06/19 1147  Resp 8 12/06/19 1147  SpO2 100 % 12/06/19 1147  Vitals shown include unvalidated device data.  Last Pain:  Vitals:   12/06/19 0808  TempSrc:   PainSc: 7       Patients Stated Pain Goal: 4 (78/93/81 0175)  Complications: No apparent anesthesia complications

## 2019-12-06 NOTE — Anesthesia Postprocedure Evaluation (Signed)
Anesthesia Post Note  Patient: Gloria [name]  Procedure(s) Performed: CYSTOSCOPY BILATERAL URETEROSCOPY/HOLMIUM LASER/STENT EXCHANGE (Left )     Patient location during evaluation: PACU Anesthesia Type: General Level of consciousness: awake and alert Pain management: pain level controlled Vital Signs Assessment: post-procedure vital signs reviewed and stable Respiratory status: spontaneous breathing, nonlabored ventilation, respiratory function stable and patient connected to nasal cannula oxygen Cardiovascular status: blood pressure returned to baseline and stable Postop Assessment: no apparent nausea or vomiting Anesthetic complications: no    Last Vitals:  Vitals:   12/06/19 1245 12/06/19 1300  BP: (!) 165/98 (!) 156/77  Pulse: (!) 56 (!) 52  Resp: 14 17  Temp:  36.5 C  SpO2: 99% 97%    Last Pain:  Vitals:   12/06/19 1230  TempSrc:   PainSc: 0-No pain                 Effie Berkshire

## 2019-12-06 NOTE — Anesthesia Preprocedure Evaluation (Addendum)
Anesthesia Evaluation  Patient identified by MRN, date of birth, ID band Patient awake    Reviewed: Allergy & Precautions, NPO status , Patient's Chart, lab work & pertinent test results  Airway Mallampati: I  TM Distance: >3 FB Neck ROM: Full    Dental  (+) Teeth Intact, Dental Advisory Given   Pulmonary neg pulmonary ROS,    breath sounds clear to auscultation       Cardiovascular hypertension, Pt. on home beta blockers + dysrhythmias Atrial Fibrillation  Rhythm:Regular Rate:Normal     Neuro/Psych negative neurological ROS     GI/Hepatic negative GI ROS, Neg liver ROS,   Endo/Other  negative endocrine ROS  Renal/GU Renal disease     Musculoskeletal negative musculoskeletal ROS (+)   Abdominal Normal abdominal exam  (+)   Peds  Hematology negative hematology ROS (+)   Anesthesia Other Findings   Reproductive/Obstetrics                            Anesthesia Physical Anesthesia Plan  ASA: III  Anesthesia Plan: General   Post-op Pain Management:    Induction: Intravenous  PONV Risk Score and Plan: 4 or greater and Ondansetron and Treatment may vary due to age or medical condition  Airway Management Planned: LMA  Additional Equipment: None  Intra-op Plan:   Post-operative Plan: Extubation in OR  Informed Consent: I have reviewed the patients History and Physical, chart, labs and discussed the procedure including the risks, benefits and alternatives for the proposed anesthesia with the patient or authorized representative who has indicated his/her understanding and acceptance.     Dental advisory given  Plan Discussed with: CRNA  Anesthesia Plan Comments: (EKG: atrial fibrillation.  Echo: 1. Left ventricular ejection fraction, by estimation, is 60 to 65%. The  left ventricle has normal function. The left ventricle has no regional  wall motion abnormalities. There is  severe left ventricular hypertrophy.  Left ventricular diastolic function  could not be evaluated.  2. Right ventricular systolic function is normal. The right ventricular  size is moderately enlarged. There is moderately elevated pulmonary artery  systolic pressure. The estimated right ventricular systolic pressure is  93.7 mmHg.  3. Left atrial size was severely dilated.  4. Right atrial size was mildly dilated.  5. The mitral valve is normal in structure. Mild mitral valve  regurgitation.  6. Tricuspid valve regurgitation is moderate.  7. The aortic valve is tricuspid. Aortic valve regurgitation is mild. No  aortic stenosis is present.  8. Aortic dilatation noted. 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  9. The inferior vena cava is dilated in size with <50% respiratory  variability, suggesting right atrial pressure of 15 mmHg.  )       Anesthesia Quick Evaluation

## 2019-12-06 NOTE — Interval H&P Note (Signed)
History and Physical Interval Note:  12/06/2019 8:52 AM  Gloria [name]  has presented today for surgery, with the diagnosis of Malakoff.  The various methods of treatment have been discussed with the patient and family. After consideration of risks, benefits and other options for treatment, the patient has consented to  bilateral ureteral ureteroscopy/laser lithotripsy and stone remove with stent exchange as a surgical intervention.  The patient's history has been reviewed, patient examined, no change in status, stable for surgery.  I have reviewed the patient's chart and labs.  Questions were answered to the patient's satisfaction.     Ardis Hughs

## 2019-12-06 NOTE — Discharge Instructions (Signed)
DISCHARGE INSTRUCTIONS FOR KIDNEY STONE/URETERAL STENT   MEDICATIONS:  1.  Resume all your other meds from home - except do not take any extra narcotic pain meds that you may have at home.  2. Pyridium is to help with the burning/stinging when you urinate. 3. Tramadol is for moderate/severe pain, otherwise taking upto 1000 mg every 6 hours of plainTylenol will help treat your pain.    ACTIVITY:  1. No strenuous activity x 1week  2. No driving while on narcotic pain medications  3. Drink plenty of water  4. Continue to walk at home - you can still get blood clots when you are at home, so keep active, but don't over do it.  5. May return to work/school tomorrow or when you feel ready   BATHING:  1. You can shower and we recommend daily showers   SIGNS/SYMPTOMS TO CALL:  Please call us if you have a fever greater than 101.5, uncontrolled nausea/vomiting, uncontrolled pain, dizziness, unable to urinate, bloody urine, chest pain, shortness of breath, leg swelling, leg pain, redness around wound, drainage from wound, or any other concerns or questions.   You can reach [place] at [phone].   FOLLOW-UP:  1. You will be scheduled for completion of the stone removal in 2 weeks.\

## 2019-12-06 NOTE — Op Note (Signed)
Preoperative diagnosis:  1. Bilateral ureteral calculi  Postoperative diagnosis:  Same Procedure: 1. Bilateral ureteroscopy, laser lithotripsy and stone removal 2. Bilateral retrograde pyelograms with interpretation 3. Bilateral ureteral stent exchange  Surgeon: Ardis Hughs, MD  Anesthesia: General  Complications: None  Intraoperative findings:  1.:  The patient has right retrograde pyelogram demonstrated a large filling defect in the lower pole of the right kidney with no significant hydroureteronephrosis or other abnormality. 2.:  The patient's left retrograde pyelogram demonstrated a normal ureter with a normal caliber no filling defects.  There is a large filling defect at the UPJ consistent with the patient's known stone. 3.:  This stone in the lower pole the right kidney was fragmented and thus pieces were then moved to the upper pole/mid pole calyx and the stone was dusted and fragmented into the very small pieces that should pass.  There are no additional stone fragments that were too big to pass. 4. the patient had a large stone at the left UPJ that I fragmented/dusted.  There was a was 2 cm of stone, about 50% of the stone was fragmented.  Visualization became problematic in the patient had been under anesthesia in dorsal lithotomy for 2 hours.  At this point I opted to stage the patient's left ureteroscopy.  EBL: Minimal  Specimens: None  Indication: ERLINE SIDDOWAY is a 74 y.o. patient with Large bilateral stones who presented several weeks ago with renal failure.  She has stents placed at that time.  Two weeks ago she had a right-sided procedure.  She presents today for completion of the right side and initiation of left-sided ureteroscopy and laser lithotripsy..  After reviewing the management options for treatment, he elected to proceed with the above surgical procedure(s). We have discussed the potential benefits and risks of the procedure, side effects of the  proposed treatment, the likelihood of the patient achieving the goals of the procedure, and any potential problems that might occur during the procedure or recuperation. Informed consent has been obtained.  Description of procedure:  The patient was taken to the operating room and general anesthesia was induced.  The patient was placed in the dorsal lithotomy position, prepped and draped in the usual sterile fashion, and preoperative antibiotics were administered. A preoperative time-out was performed.   Twenty-one French 30 degree cystoscope was gently passed through the patient's urethra and into the bladder under visual guidance.  The stent emanating from the patient's right ureter was grasped with a stent grasper and brought to the urethral meatus.  A Sensor wire was then advanced up through the wire and into the right renal pelvis under visual guidance.  The stent was then removed over the wire.  I then advanced a dual-lumen catheter performed a right retrograde pyelogram with the above findings.  Subsequently advanced a 2nd wire through the dual-lumen and into the right renal pelvis under force copy.  I then removed the dual-lumen catheter and advanced a 12/14 French x 35 cm ureteral access sheath under fluoroscopic guidance into the right proximal ureter.  The inner portion of the sheath was removed with the 2nd wire.  I then used the flexible ureteral scope and advanced it up into the right renal pelvis.  The stone was noted to be within the lower pole in a very difficult angle.  I was able to ultimately fragment the stone with the laser and then basketed the big pieces in move them up into the interpolar calyx.  I then  continued to fragment them and [place] them into very small pieces.  I explored the remainder of the renal pelvis and any additional stone fragments were fragmented/dusted.  There did not appear to be any additional large stone fragments at this time.  I subsequently slowly backed out the  ureteral scope and the access she simultaneously noting no significant ureteral trauma.  I then advanced a 24 cm x 6 French double-J stent over the wire and advanced in the renal pelvis under fluoroscopic guidance.  Once the stent was noted to be well within the right renal pelvis I advanced it to the res meatus and putting some gentle traction on the stent while removing the wire the stent was deployed into the patient's bladder.  I then repassed the cystoscope into the patient's bladder and grasped the stent emanating from the patient's left ureteral orifice.  I advanced a wire through the stent up into the left renal pelvis.  I then removed the stent over the wire exchanged for the dual-lumen catheter.  I advanced a dual-lumen into the distal aspect of the patient's ureter and performed retrograde pyelogram with the above findings.  I then advanced a 2nd wire through the dual-lumen catheter and removed the catheter over the 2nd wire.  I then advanced a 12/14 French x 38 cm ureteral access sheath over the 2nd wire and into the proximal ureter removing the inner portion of the wire and sheath.  I then advanced the flexible ureteral scope up to the renal pelvis and using a of laser fragmented the stone.  Stone was noted to be approximately 2 cm and as such only vault 50% of the stone was dusted at the time.  Given the difficulty in visualization as well as the amount of time or any under anesthesia have to do staged is portion of the procedure and will reschedule the patient to have a left-sided ureteroscopy in the coming weeks.  I slowly backed out this ureteral scope and the sheath simultaneously noting no ureteral trauma.  I then advanced a 24 cm x 6 French double-J stent over the wire and into the patient's left renal pelvis prior to removing the wire.  The will stent was noted to be well positioned within left kidney.  I then readvanced the cystoscope noting that there were 2 nice curl within the patient's  bladder.  No stent others were left.  The bladder subsequently empty.  The patient was subsequently extubated returned the PACU in stable condition.  Ardis Hughs, M.D.

## 2019-12-06 NOTE — Anesthesia Procedure Notes (Addendum)
Procedure Name: LMA Insertion Date/Time: 12/06/2019 9:35 AM Performed by: Talbot Grumbling, CRNA Pre-anesthesia Checklist: Patient identified, Emergency Drugs available, Suction available and Patient being monitored Patient Re-evaluated:Patient Re-evaluated prior to induction Oxygen Delivery Method: Circle system utilized Preoxygenation: Pre-oxygenation with 100% oxygen Induction Type: IV induction Ventilation: Mask ventilation without difficulty LMA: LMA inserted LMA Size: 4.0 Number of attempts: 1 Placement Confirmation: positive ETCO2 and breath sounds checked- equal and bilateral Tube secured with: Tape Dental Injury: Teeth and Oropharynx as per pre-operative assessment

## 2019-12-11 ENCOUNTER — Other Ambulatory Visit: Payer: Self-pay | Admitting: Urology

## 2019-12-18 ENCOUNTER — Other Ambulatory Visit: Payer: Self-pay

## 2019-12-18 ENCOUNTER — Encounter (HOSPITAL_COMMUNITY): Payer: Self-pay

## 2019-12-18 ENCOUNTER — Encounter (HOSPITAL_COMMUNITY)
Admission: RE | Admit: 2019-12-18 | Discharge: 2019-12-18 | Disposition: A | Discharge status: Home / Self Care | Payer: PPO | Source: Ambulatory Visit | Attending: Urology | Admitting: Urology

## 2019-12-18 DIAGNOSIS — Z01818 Encounter for other preprocedural examination: Secondary | ICD-10-CM | POA: Diagnosis not present

## 2019-12-18 HISTORY — DX: Unspecified osteoarthritis, unspecified site: M19.90

## 2019-12-18 NOTE — Progress Notes (Signed)
COVID Vaccine Completed:No Date COVID Vaccine completed: COVID vaccine manufacturer: [place]   PCP - Dr. Sheral Apley Cardiologist - no  Chest x-ray - 11/08/19 EKG - 11/11/19 Stress Test - no ECHO - 11/09/19 Cardiac Cath - no  Sleep Study -no  CPAP -   Fasting Blood Sugar - NA Checks Blood Sugar _____ times a day  Blood Thinner Instructions:ASA Aspirin Instructions:Dr. Louis Meckel said to stop it 7 days prior to DOS Last Dose:12/18/19  Anesthesia review:   Patient denies shortness of breath, fever, cough and chest pain at PAT appointment yes   Patient verbalized understanding of instructions that were given to them at the PAT appointment. Patient was also instructed that they will need to review over the PAT instructions again at home before surgery. Yes. Pt has hearing aid in Lt ear. She is very HOH and reads lips. Her Brother is her POA.

## 2019-12-18 NOTE — Patient Instructions (Addendum)
DUE TO COVID-19 ONLY ONE VISITOR IS ALLOWED TO COME WITH YOU AND STAY IN THE WAITING ROOM ONLY DURING PRE OP AND PROCEDURE DAY OF SURGERY. THE 1 VISITOR MAY VISIT WITH YOU AFTER SURGERY IN YOUR PRIVATE ROOM DURING VISITING HOURS ONLY!  YOU NEED TO HAVE A COVID 19 TEST ON: 12/23/19 @  9:45              , THIS TEST MUST BE DONE BEFORE SURGERY, COME  Gloria [name] , [id number].  ([place]) ONCE YOUR COVID TEST IS COMPLETED, PLEASE BEGIN THE QUARANTINE INSTRUCTIONS AS OUTLINED IN YOUR HANDOUT.                Gloria [name]    Your procedure is scheduled on: 12/25/19   Report to [facility] Main  Entrance   Report to admitting at: 8:30 AM     Call this number if you have problems the morning of surgery [phone]    Remember:   NO SOLID FOOD AFTER MIDNIGHT THE NIGHT PRIOR TO SURGERY. NOTHING BY MOUTH EXCEPT CLEAR LIQUIDS UNTIL: 7:30 am .     CLEAR LIQUID DIET   Foods Allowed                                                                     Foods Excluded  Coffee and tea, regular and decaf                             liquids that you cannot  Plain Jell-O any favor except red or purple                                           see through such as: Fruit ices (not with fruit pulp)                                     milk, soups, orange juice  Iced Popsicles                                    All solid food Carbonated beverages, regular and diet                                    Cranberry, grape and apple juices Sports drinks like Gatorade Lightly seasoned clear broth or consume(fat free) Sugar, honey syrup    BRUSH YOUR TEETH MORNING OF SURGERY AND RINSE YOUR MOUTH OUT, NO CHEWING GUM CANDY OR MINTS.     Take these medicines the morning of surgery with A SIP OF WATER: carvedilol                                 You may not have any metal on your body including hair pins and  piercings  Do not wear jewelry, make-up,  lotions, powders or perfumes, deodorant             Do not wear nail polish on your fingernails.             Do not shave  48 hours prior to surgery.              Do not bring valuables to the hospital. [place].  Contacts, dentures or bridgework may not be worn into surgery.       Patients discharged the day of surgery will not be allowed to drive home.  IF YOU ARE HAVING SURGERY AND GOING HOME THE SAME DAY, YOU MUST HAVE AN ADULT TO DRIVE YOU HOME AND BE WITH YOU FOR 24 HOURS . YOU MAY GO HOME BY TAXI OR UBER OR ORTHERWISE, BUT AN ADULT MUST ACCOMPANY YOU HOME AND STAY WITH YOU FOR 24 HOURS.  Name and phone number of your driver:  Special Instructions: N/A              Please read over the following fact sheets you were given: _____________________________________________________________________  [facility] - Preparing for Surgery Before surgery, you can play an important role .  Because skin is not sterile, your skin needs to be as free of germs as possible.   You can reduce the number of germs on your skin by washing with CHG (chlorahexidine gluconate) soap before surgery.   CHG is an antiseptic cleaner which kills germs and bonds with the skin to continue killing germs even after washing. Please DO NOT use if you have an allergy to CHG or antibacterial soaps.   If your skin becomes reddened/irritated stop using the CHG and inform your nurse when you arrive at Short Stay. Do not shave (including legs and underarms) for at least 48 hours prior to the first CHG shower.   Please follow these instructions carefully:   1.  Shower with CHG Soap the night before surgery and the  morning of Surgery.  2.  If you choose to wash your hair, wash your hair first as usual with your  normal  shampoo.  3.  After you shampoo, rinse your hair and body thoroughly to remove the  shampoo.                                        4.  Use CHG as you  would any other liquid soap.  You can apply chg directly  to the skin and wash                       Gently with a scrungie or clean washcloth.  5.  Apply the CHG Soap to your body ONLY FROM THE NECK DOWN.   Do not use on face/ open                           Wound or open sores. Avoid contact with eyes, ears mouth and genitals (private parts).                       Wash face,  Genitals (private parts) with your normal soap.  6.  Wash thoroughly, paying special attention to the area where your surgery  will be performed.  7.  Thoroughly rinse your body with warm water from the neck down.  8.  DO NOT shower/wash with your normal soap after using and rinsing off  the CHG Soap.             9.  Pat yourself dry with a clean towel.            10.  Wear clean pajamas.            11.  Place clean sheets on your bed the night of your first shower and do not  sleep with pets. Day of Surgery : Do not apply any lotions/deodorants the morning of surgery.  Please wear clean clothes to the hospital/surgery center.  FAILURE TO FOLLOW THESE INSTRUCTIONS MAY RESULT IN THE CANCELLATION OF YOUR SURGERY PATIENT SIGNATURE_________________________________  NURSE SIGNATURE__________________________________  ________________________________________________________________________

## 2019-12-18 NOTE — Progress Notes (Signed)
Pt. Needs orders for upcomming surgery.PST appointment on 12/18/19.Thank you.

## 2019-12-23 ENCOUNTER — Encounter (HOSPITAL_COMMUNITY)
Admission: RE | Admit: 2019-12-23 | Discharge: 2019-12-23 | Disposition: A | Discharge status: Home / Self Care | Payer: PPO | Source: Ambulatory Visit | Attending: Urology | Admitting: Urology

## 2019-12-23 ENCOUNTER — Other Ambulatory Visit: Payer: Self-pay

## 2019-12-23 ENCOUNTER — Other Ambulatory Visit (HOSPITAL_COMMUNITY)
Admission: RE | Admit: 2019-12-23 | Discharge: 2019-12-23 | Disposition: A | Discharge status: Home / Self Care | Payer: PPO | Source: Ambulatory Visit | Attending: Urology | Admitting: Urology

## 2019-12-23 DIAGNOSIS — Z20822 Contact with and (suspected) exposure to covid-19: Secondary | ICD-10-CM | POA: Insufficient documentation

## 2019-12-23 DIAGNOSIS — Z01812 Encounter for preprocedural laboratory examination: Secondary | ICD-10-CM | POA: Diagnosis not present

## 2019-12-23 LAB — BASIC METABOLIC PANEL
Anion gap: 10 (ref 5–15)
BUN: 26 mg/dL — ABNORMAL HIGH (ref 8–23)
CO2: 25 mmol/L (ref 22–32)
Calcium: 9 mg/dL (ref 8.9–10.3)
Chloride: 108 mmol/L (ref 98–111)
Creatinine, Ser: 1.49 mg/dL — ABNORMAL HIGH (ref 0.44–1.00)
GFR calc Af Amer: 40 mL/min — ABNORMAL LOW (ref >60)
GFR calc non Af Amer: 34 mL/min — ABNORMAL LOW (ref >60)
Glucose, Bld: 96 mg/dL (ref 70–99)
Potassium: 4.8 mmol/L (ref 3.5–5.1)
Sodium: 143 mmol/L (ref 135–145)

## 2019-12-23 LAB — CBC
HCT: 38.1 % (ref 36.0–46.0)
Hemoglobin: 11.8 g/dL — ABNORMAL LOW (ref 12.0–15.0)
MCH: 32.4 pg (ref 26.0–34.0)
MCHC: 31 g/dL (ref 30.0–36.0)
MCV: 104.7 fL — ABNORMAL HIGH (ref 80.0–100.0)
Platelets: 224 10*3/uL (ref 150–400)
RBC: 3.64 MIL/uL — ABNORMAL LOW (ref 3.87–5.11)
RDW: 13.9 % (ref 11.5–15.5)
WBC: 3.9 10*3/uL — ABNORMAL LOW (ref 4.0–10.5)
nRBC: 0 % (ref 0.0–0.2)

## 2019-12-23 LAB — SARS CORONAVIRUS 2 (TAT 6-24 HRS): SARS Coronavirus 2: NEGATIVE

## 2019-12-24 MED ORDER — GENTAMICIN SULFATE 40 MG/ML IJ SOLN
5.0000 mg/kg | INTRAVENOUS | Status: AC
  Administered 2019-12-25: 350 mg via INTRAVENOUS
  Filled 2019-12-24: qty 8.75

## 2019-12-25 ENCOUNTER — Encounter (HOSPITAL_COMMUNITY)
Admission: RE | Disposition: A | Discharge status: Home / Self Care | Payer: Self-pay | Source: Ambulatory Visit | Attending: Urology

## 2019-12-25 ENCOUNTER — Encounter (HOSPITAL_COMMUNITY): Payer: Self-pay | Admitting: Urology

## 2019-12-25 ENCOUNTER — Ambulatory Visit (HOSPITAL_COMMUNITY): Payer: PPO | Admitting: Physician Assistant

## 2019-12-25 ENCOUNTER — Ambulatory Visit (HOSPITAL_COMMUNITY)
Admission: RE | Admit: 2019-12-25 | Discharge: 2019-12-25 | Disposition: A | Discharge status: Home / Self Care | Payer: PPO | Source: Ambulatory Visit | Attending: Urology | Admitting: Urology

## 2019-12-25 ENCOUNTER — Ambulatory Visit (HOSPITAL_COMMUNITY): Payer: PPO

## 2019-12-25 ENCOUNTER — Ambulatory Visit (HOSPITAL_COMMUNITY): Payer: PPO | Admitting: Anesthesiology

## 2019-12-25 DIAGNOSIS — Z466 Encounter for fitting and adjustment of urinary device: Secondary | ICD-10-CM | POA: Insufficient documentation

## 2019-12-25 DIAGNOSIS — I10 Essential (primary) hypertension: Secondary | ICD-10-CM | POA: Insufficient documentation

## 2019-12-25 DIAGNOSIS — N179 Acute kidney failure, unspecified: Secondary | ICD-10-CM | POA: Diagnosis not present

## 2019-12-25 DIAGNOSIS — Z7982 Long term (current) use of aspirin: Secondary | ICD-10-CM | POA: Insufficient documentation

## 2019-12-25 DIAGNOSIS — N132 Hydronephrosis with renal and ureteral calculous obstruction: Secondary | ICD-10-CM | POA: Diagnosis not present

## 2019-12-25 DIAGNOSIS — I272 Pulmonary hypertension, unspecified: Secondary | ICD-10-CM | POA: Insufficient documentation

## 2019-12-25 DIAGNOSIS — N139 Obstructive and reflux uropathy, unspecified: Secondary | ICD-10-CM

## 2019-12-25 DIAGNOSIS — Z79899 Other long term (current) drug therapy: Secondary | ICD-10-CM | POA: Insufficient documentation

## 2019-12-25 DIAGNOSIS — I4891 Unspecified atrial fibrillation: Secondary | ICD-10-CM | POA: Insufficient documentation

## 2019-12-25 DIAGNOSIS — I639 Cerebral infarction, unspecified: Secondary | ICD-10-CM | POA: Diagnosis not present

## 2019-12-25 DIAGNOSIS — N200 Calculus of kidney: Secondary | ICD-10-CM | POA: Diagnosis not present

## 2019-12-25 HISTORY — PX: CYSTOSCOPY/URETEROSCOPY/HOLMIUM LASER/STENT PLACEMENT: SHX6546

## 2019-12-25 SURGERY — CYSTOSCOPY/URETEROSCOPY/HOLMIUM LASER/STENT PLACEMENT
Anesthesia: General | Laterality: Bilateral

## 2019-12-25 MED ORDER — LIDOCAINE 2% (20 MG/ML) 5 ML SYRINGE
INTRAMUSCULAR | Status: AC
  Filled 2019-12-25: qty 5

## 2019-12-25 MED ORDER — BELLADONNA ALKALOIDS-OPIUM 16.2-30 MG RE SUPP
RECTAL | Status: AC
  Filled 2019-12-25: qty 1

## 2019-12-25 MED ORDER — PROPOFOL 10 MG/ML IV BOLUS
INTRAVENOUS | Status: AC
  Filled 2019-12-25: qty 20

## 2019-12-25 MED ORDER — OXYCODONE HCL 5 MG PO TABS: 5.0000 mg | ORAL_TABLET | Freq: Once | ORAL | Status: DC | PRN

## 2019-12-25 MED ORDER — BELLADONNA ALKALOIDS-OPIUM 16.2-60 MG RE SUPP
RECTAL | Status: DC | PRN
  Administered 2019-12-25: 1 via RECTAL

## 2019-12-25 MED ORDER — OXYCODONE HCL 5 MG/5ML PO SOLN: 5.0000 mg | Freq: Once | ORAL | Status: DC | PRN

## 2019-12-25 MED ORDER — FENTANYL CITRATE (PF) 100 MCG/2ML IJ SOLN: 25.0000 ug | INTRAMUSCULAR | Status: DC | PRN

## 2019-12-25 MED ORDER — EPHEDRINE SULFATE-NACL 50-0.9 MG/10ML-% IV SOSY
PREFILLED_SYRINGE | INTRAVENOUS | Status: DC | PRN
  Administered 2019-12-25: 5 mg via INTRAVENOUS
  Administered 2019-12-25 (×2): 10 mg via INTRAVENOUS

## 2019-12-25 MED ORDER — ONDANSETRON HCL 4 MG/2ML IJ SOLN
INTRAMUSCULAR | Status: DC | PRN
  Administered 2019-12-25: 4 mg via INTRAVENOUS

## 2019-12-25 MED ORDER — CHLORHEXIDINE GLUCONATE 0.12 % MT SOLN
15.0000 mL | Freq: Once | OROMUCOSAL | Status: AC
  Administered 2019-12-25: 15 mL via OROMUCOSAL

## 2019-12-25 MED ORDER — LIDOCAINE 2% (20 MG/ML) 5 ML SYRINGE
INTRAMUSCULAR | Status: DC | PRN
  Administered 2019-12-25: 60 mg via INTRAVENOUS

## 2019-12-25 MED ORDER — LACTATED RINGERS IV SOLN
INTRAVENOUS | Status: DC | PRN
Start: 2019-12-25 — End: 2019-12-25

## 2019-12-25 MED ORDER — ONDANSETRON HCL 4 MG/2ML IJ SOLN: 4.0000 mg | Freq: Once | INTRAMUSCULAR | Status: DC | PRN

## 2019-12-25 MED ORDER — FENTANYL CITRATE (PF) 100 MCG/2ML IJ SOLN
INTRAMUSCULAR | Status: AC
  Filled 2019-12-25: qty 2

## 2019-12-25 MED ORDER — ONDANSETRON HCL 4 MG/2ML IJ SOLN
INTRAMUSCULAR | Status: AC
  Filled 2019-12-25: qty 2

## 2019-12-25 MED ORDER — PROPOFOL 10 MG/ML IV BOLUS
INTRAVENOUS | Status: DC | PRN
  Administered 2019-12-25: 130 mg via INTRAVENOUS

## 2019-12-25 MED ORDER — FENTANYL CITRATE (PF) 100 MCG/2ML IJ SOLN
INTRAMUSCULAR | Status: DC | PRN
  Administered 2019-12-25 (×4): 25 ug via INTRAVENOUS

## 2019-12-25 MED ORDER — SODIUM CHLORIDE 0.9 % IR SOLN
Status: DC | PRN
  Administered 2019-12-25: 6000 mL via INTRAVESICAL

## 2019-12-25 MED ORDER — ORAL CARE MOUTH RINSE: 15.0000 mL | Freq: Once | OROMUCOSAL | Status: AC

## 2019-12-25 MED ORDER — IOHEXOL 300 MG/ML  SOLN
INTRAMUSCULAR | Status: DC | PRN
  Administered 2019-12-25: 20 mL

## 2019-12-25 MED ORDER — EPHEDRINE 5 MG/ML INJ
INTRAVENOUS | Status: AC
  Filled 2019-12-25: qty 10

## 2019-12-25 SURGICAL SUPPLY — 20 items
BAG URO CATCHER STRL LF (MISCELLANEOUS) ×3 IMPLANT
BASKET ZERO TIP NITINOL 2.4FR (BASKET) IMPLANT
CATH URET 5FR 28IN OPEN ENDED (CATHETERS) ×3 IMPLANT
CATH URET DUAL LUMEN 6-10FR 50 (CATHETERS) ×3 IMPLANT
CLOTH BEACON ORANGE TIMEOUT ST (SAFETY) ×3 IMPLANT
EXTRACTOR STONE 1.7FRX115CM (UROLOGICAL SUPPLIES) ×3 IMPLANT
FIBER LASER TRAC TIP (UROLOGICAL SUPPLIES) ×3 IMPLANT
GLOVE BIOGEL M STRL SZ7.5 (GLOVE) ×3 IMPLANT
GOWN STRL REUS W/TWL XL LVL3 (GOWN DISPOSABLE) ×3 IMPLANT
GUIDEWIRE ANG ZIPWIRE 038X150 (WIRE) IMPLANT
GUIDEWIRE STR DUAL SENSOR (WIRE) ×9 IMPLANT
KIT TURNOVER KIT A (KITS) IMPLANT
MANIFOLD NEPTUNE II (INSTRUMENTS) ×3 IMPLANT
SHEATH URETERAL 12FRX28CM (UROLOGICAL SUPPLIES) IMPLANT
SHEATH URETERAL 12FRX35CM (MISCELLANEOUS) ×3 IMPLANT
STENT URET 6FRX24 CONTOUR (STENTS) ×6 IMPLANT
TRAY CYSTO PACK (CUSTOM PROCEDURE TRAY) ×3 IMPLANT
TUBING CONNECTING 10 (TUBING) ×2 IMPLANT
TUBING CONNECTING 10' (TUBING) ×1
TUBING UROLOGY SET (TUBING) ×3 IMPLANT

## 2019-12-25 NOTE — H&P (Signed)
History of present illness: 74 year old female with a history of mild renal insufficiency who has a history of osteoarthritis and associated joint pain who has been following with her PCP over the last several days because of her pain and elevated blood pressure.  She was noted to have at elevated creatinine.  This was subsequently repeated and the creatinine was higher.  The patient was experiencing some right-sided flank and abdominal pain, which she initially attributed to her osteoarthritis and hip pain.  She was sent to the emergency department for further evaluation.  She had a ultrasound demonstrating hydronephrosis.  A CT scan was subsequently performed demonstrating bilateral hydronephrosis with bilateral obstructing stones.  Her creatinine was 5.2.  Given her obstructing stones and worsening renal function urology was consulted.  The patient denies any dysuria, fevers or chills.  Review of systems: A 12 point comprehensive review of systems was obtained and is negative unless otherwise stated in the history of present illness.      Patient Active Problem List   Diagnosis Date Noted  . Acute renal failure (ARF) ([place]) 11/08/2019  . Obstructive uropathy 11/08/2019  . Hyperkalemia 11/08/2019  . Metabolic acidosis 74/02/1447  . Hyperphosphatemia 11/08/2019    No current facility-administered medications on file prior to encounter.         Current Outpatient Medications on File Prior to Encounter  Medication Sig Dispense Refill  . aspirin 325 MG tablet Take 325 mg by mouth daily.    . carvedilol (COREG) 6.25 MG tablet Take 6.25 mg by mouth 2 (two) times daily.    . furosemide (LASIX) 20 MG tablet Take 20 mg by mouth as needed for fluid.    Marland Kitchen meclizine (ANTIVERT) 25 MG tablet Take 25 mg by mouth as needed for dizziness.          Past Medical History:  Diagnosis Date  . Atrial fibrillation ([place])   . Claustrophobia   . HOH (hard of hearing)     History  reviewed. No pertinent surgical history.  Social History       Tobacco Use  . Smoking status: Never Smoker  . Smokeless tobacco: Never Used  Substance Use Topics  . Alcohol use: Not on file  . Drug use: Not on file    History reviewed. No pertinent family history.  PE:       Vitals:   11/09/19 0241 11/09/19 0500 11/09/19 0643 11/09/19 0912  BP: 124/85  (!) 162/92 (!) 149/81  Pulse: 61  65 (!) 55  Resp: 16  17 18   Temp: 97.7 F (36.5 C)  97.8 F (36.6 C) 98 F (36.7 C)  TempSrc: Oral  Oral Oral  SpO2: 93%  95% 93%  Weight:  98.8 kg    Height:       Patient appears to be in no acute distress  patient is alert and oriented x3 Atraumatic normocephalic head No cervical or supraclavicular lymphadenopathy appreciated No increased work of breathing, no audible wheezes/rhonchi Regular sinus rhythm/rate Abdomen is soft, nontender, nondistended, right-sided CVA and flank tenderness Lower extremities are symmetric without appreciable edema Grossly neurologically intact No identifiable skin lesions  Recent Labs (last 2 labs)      Recent Labs    11/08/19 1722  WBC 9.0  HGB 12.4  HCT 40.0     Recent Labs (last 2 labs)        Recent Labs    11/08/19 1722 11/09/19 0000 11/09/19 0431  NA 141  --  139  K 5.6* 5.4* 5.3*  CL 109  --  109  CO2 21*  --  19*  GLUCOSE 101*  --  84  BUN 98*  --  97*  CREATININE 5.23*  --  5.24*  CALCIUM 8.7*  --  8.3*     Recent Labs (last 2 labs)   No results for input(s): LABPT, INR in the last 72 hours.   Recent Labs (last 2 labs)   No results for input(s): LABURIN in the last 72 hours.   >         Results for orders placed or performed during the hospital encounter of 11/08/19  Respiratory Panel by RT PCR (Flu A&B, Covid) - Nasopharyngeal Swab     Status: None   Collection Time: 11/08/19 10:48 PM   Specimen: Nasopharyngeal Swab  Result Value Ref Range Status   SARS Coronavirus 2 by RT PCR NEGATIVE  NEGATIVE Final    Comment: (NOTE) SARS-CoV-2 target nucleic acids are NOT DETECTED. The SARS-CoV-2 RNA is generally detectable in upper respiratoy specimens during the acute phase of infection. The lowest concentration of SARS-CoV-2 viral copies this assay can detect is 131 copies/mL. A negative result does not preclude SARS-Cov-2 infection and should not be used as the sole basis for treatment or other patient management decisions. A negative result may occur with  improper specimen collection/handling, submission of specimen other than nasopharyngeal swab, presence of viral mutation(s) within the areas targeted by this assay, and inadequate number of viral copies (<131 copies/mL). A negative result must be combined with clinical observations, patient history, and epidemiological information. The expected result is Negative. Fact Sheet for Patients:  PinkCheek.be Fact Sheet for Healthcare Providers:  GravelBags.it This test is not yet ap proved or cleared by the [place] FDA and  has been authorized for detection and/or diagnosis of SARS-CoV-2 by FDA under an Emergency Use Authorization (EUA). This EUA will remain  in effect (meaning this test can be used) for the duration of the COVID-19 declaration under Section 564(b)(1) of the Act, 21 U.S.C. section 360bbb-3(b)(1), unless the authorization is terminated or revoked sooner.    Influenza A by PCR NEGATIVE NEGATIVE Final   Influenza B by PCR NEGATIVE NEGATIVE Final    Comment: (NOTE) The Xpert Xpress SARS-CoV-2/FLU/RSV assay is intended as an aid in  the diagnosis of influenza from Nasopharyngeal swab specimens and  should not be used as a sole basis for treatment. Nasal washings and  aspirates are unacceptable for Xpert Xpress SARS-CoV-2/FLU/RSV  testing. Fact Sheet for Patients: PinkCheek.be Fact Sheet for Healthcare  Providers: GravelBags.it This test is not yet approved or cleared by the [place] FDA and  has been authorized for detection and/or diagnosis of SARS-CoV-2 by  FDA under an Emergency Use Authorization (EUA). This EUA will remain  in effect (meaning this test can be used) for the duration of the  Covid-19 declaration under Section 564(b)(1) of the Act, 21  U.S.C. section 360bbb-3(b)(1), unless the authorization is  terminated or revoked. Performed at [facility], [place] [address] [place], [place] [address]       Imaging: I have independently reviewed the patient's CT scan demonstrating a large proximal ureteral stones with associated hydronephrosis.  Imp: The patient has acute on chronic renal failure from bilateral ureteral obstruction.  Recommendations: Our plan is to take the patient urgently to the operating room and placed bilateral ureteral stents.  I spoke with her brother as well as the patient collectively about  the proposed procedure.  I explained the rationale for it and the risk and associated benefits.  The patient has opted to proceed.

## 2019-12-25 NOTE — Anesthesia Preprocedure Evaluation (Signed)
Anesthesia Evaluation  Patient identified by MRN, date of birth, ID band Patient awake    Reviewed: Allergy & Precautions, NPO status , Patient's Chart, lab work & pertinent test results  Airway Mallampati: I  TM Distance: >3 FB Neck ROM: Full    Dental  (+) Teeth Intact, Dental Advisory Given   Pulmonary  pulm HTN   Pulmonary exam normal        Cardiovascular hypertension, Pt. on home beta blockers Normal cardiovascular exam+ dysrhythmias Atrial Fibrillation      Neuro/Psych negative neurological ROS     GI/Hepatic negative GI ROS, Neg liver ROS,   Endo/Other  negative endocrine ROS  Renal/GU Renal InsufficiencyRenal disease     Musculoskeletal negative musculoskeletal ROS (+)   Abdominal Normal abdominal exam  (+)   Peds  Hematology negative hematology ROS (+)   Anesthesia Other Findings  Echo 11/09/19: EF 60-65%, normal RV function, RVSP 55, mild MR, mod TR, mild AR  Reproductive/Obstetrics                            Anesthesia Physical  Anesthesia Plan  ASA: III  Anesthesia Plan: General   Post-op Pain Management:    Induction: Intravenous  PONV Risk Score and Plan: 4 or greater and Ondansetron, Treatment may vary due to age or medical condition and Dexamethasone  Airway Management Planned: LMA  Additional Equipment: None  Intra-op Plan:   Post-operative Plan: Extubation in OR  Informed Consent: I have reviewed the patients History and Physical, chart, labs and discussed the procedure including the risks, benefits and alternatives for the proposed anesthesia with the patient or authorized representative who has indicated his/her understanding and acceptance.     Dental advisory given  Plan Discussed with:   Anesthesia Plan Comments:        Anesthesia Quick Evaluation

## 2019-12-25 NOTE — Op Note (Signed)
Preoperative diagnosis:  1. Bilateral renal stones  Postoperative diagnosis:  1. Same  Procedure: 1. Cystoscopy, bilateral retrograde pyelogram with interpretation 2. Bilateral ureteroscopy stone extraction 3. Bilateral ureteral stent exchange 4. Left laser lithotripsy  Surgeon: Ardis Hughs, MD  Anesthesia: General  Complications: None  Intraoperative findings:  #1: The right retrograde pyelogram was performed through a dual-lumen catheter and demonstrated a normal caliber ureter with evidence of edema, but no significant filling defects or other abnormalities.  There was mild blunting of the calyces. #2: The left retrograde pyelogram demonstrated some hydronephrosis of the entire ureter with a bifid collecting system but no other significant abnormalities or filling defects. #3: 24 cm time 6 French double-J stents were exchanged bilaterally #4: The patient had some small stone fragments in the lower pole of the right kidney which I was able to remove.  The remainder of the fragments were small enough to pass on their own.  No laser was required on the right side. #5: In the upper pole of the left kidney was a partially fragmented stone that was fragmented the remainder of the way so that these fragments were small enough to pass.  There were no additional stone fragments that were large enough or required laser fragmentation.  The remaining stone fragments are quite small and should pass on their own.  EBL: Minimal  Specimens: Right lower pole stones as well as left upper pole stone fragments will be taken to [facility] urology specialist for stone composition analysis.  Indication: Gloria [name] is a 74 y.o. patient who presented with acute renal failure secondary to bilateral obstructing stones.  She underwent stent placement and her renal function recovered quite significantly.  She has subsequently undergone bilateral ureteroscopy and presents today for the last stage of  her bilateral ureteroscopy for her large stone burden's.  After reviewing the management options for treatment, he elected to proceed with the above surgical procedure(s). We have discussed the potential benefits and risks of the procedure, side effects of the proposed treatment, the likelihood of the patient achieving the goals of the procedure, and any potential problems that might occur during the procedure or recuperation. Informed consent has been obtained.  Description of procedure:  The patient was taken to the operating room and general anesthesia was induced.  The patient was placed in the dorsal lithotomy position, prepped and draped in the usual sterile fashion, and preoperative antibiotics were administered. A preoperative time-out was performed.   A 21 French 0 degree cystoscope was gently passed to the patient's urethra into the bladder under visual guidance.  Cystoscopy demonstrated bullous edema around the ureteral orifice ease but no other significant abnormalities.  Stent from the right orifice was grabbed and brought to the urethral meatus.  I then advanced a wire up through the stent and up into the right kidney removing the stent over the wire.  I then advanced a dual-lumen catheter and performed a right retrograde pyelogram with the above findings.  I advanced a second wire through the catheter and remove the catheter over the wire.  I then advanced a 12/14 French ureteral access sheath over the second wire and into the proximal ureter removing the inner portion as well as the safety wire.  I then performed pyeloscopy under fluoroscopic guidance noting some small stone fragments in the right lower pole which I was able to basket and removed.  I irrigated out the kidney and there were no other large stone fragments and as such  I left the remaining fragments there that will be passed with gravity.  I injected contrast through the scope was I remove the scope slowly along with the ureteral  access sheath.  There were no significant abnormalities noted on the retrograde.  I then advanced a 24 cm time 6 French double-J ureteral stent over the wire and advanced into the patient's urethral meatus slowly pulling back the wire and keeping the stent in position.  Once the wire was removed the stent popped into the patient's bladder with a nice curl in the bladder as well as the renal pelvis noted on fluoroscopy.  I then repassed the cystoscope and grasped the stent emanating from the patient's left ureteral orifice bring it to the urethral meatus.  I wired the stent with a sensor wire and remove the stent over the wire.  I then advanced a dual-lumen catheter to the ureteral orifice and performed retrograde pyelogram with the above findings.  I passed a second wire through the dual-lumen catheter removing the catheter over the wire.  I then advanced a ureteral access sheath into the proximal ureter removing the inner portion as well as the wire.  I performed pyeloscopy and noted that there was a large partially fragmented stone in the upper pole which using a 200 m fiber with settings of 0.5 and 20 I fragmented into small enough fragments that should easily pass on their own.  There were some larger fragments that I did ultimately removed with the basket.  The remaining aspect of the kidney demonstrated very small fragments that should pass on their own.  As such no additional stone fragments were removed.  I then removed the scope as well as the ureteral access sheath simultaneously inspecting the ureter on the way out noting no significant trauma.  I placed a left ureteral stent in a similar fashion as the right.  I then advanced the cystoscope into the patient's bladder to ensure that there was a nice curl within the patient's bladder for both stents and that they were well away from the urethral meatus.  The stent tethers were then brought through the urethra tied together and tucked into the patient's  vagina.  BNO suppositories placed in the patient's rectum and she was subsequently extubated return the PACU in stable condition.  Disposition: The patient will be scheduled for follow-up stent removal in 1 week.  Ardis Hughs, M.D.

## 2019-12-25 NOTE — Anesthesia Postprocedure Evaluation (Signed)
Anesthesia Post Note  Patient: Gloria [name]  Procedure(s) Performed: BILATERAL URETEROSCOPY/HOLMIUM LASER LEFT, BILATERAL STONE EXTRACTION BILATERAL STENT EXCHANGE (Bilateral )     Patient location during evaluation: PACU Anesthesia Type: General Level of consciousness: awake and alert Pain management: pain level controlled Vital Signs Assessment: post-procedure vital signs reviewed and stable Respiratory status: spontaneous breathing, nonlabored ventilation and respiratory function stable Cardiovascular status: blood pressure returned to baseline and stable Postop Assessment: no apparent nausea or vomiting Anesthetic complications: no   No complications documented.  Last Vitals:  Vitals:   12/25/19 1230 12/25/19 1304  BP: (!) 169/78 (!) 160/81  Pulse: (!) 52 (!) 52  Resp: 16 18  Temp: (!) 36.1 C   SpO2: 94% 96%    Last Pain:  Vitals:   12/25/19 1304  TempSrc:   PainSc: 0-No pain                 Lidia Collum

## 2019-12-25 NOTE — Transfer of Care (Signed)
Immediate Anesthesia Transfer of Care Note  Patient: Gloria [name]  Procedure(s) Performed: BILATERAL URETEROSCOPY/HOLMIUM LASER LEFT, BILATERAL STONE EXTRACTION BILATERAL STENT EXCHANGE (Bilateral )  Patient Location: PACU  Anesthesia Type:General  Level of Consciousness: drowsy  Airway & Oxygen Therapy: Patient Spontanous Breathing and Patient connected to face mask oxygen  Post-op Assessment: Report given to RN and Post -op Vital signs reviewed and stable  Post vital signs: Reviewed and stable  Last Vitals:  Vitals Value Taken Time  BP 165/88 12/25/19 1154  Temp    Pulse 55 12/25/19 1157  Resp 42 12/25/19 1157  SpO2 100 % 12/25/19 1157  Vitals shown include unvalidated device data.  Last Pain:  Vitals:   12/25/19 0825  TempSrc:   PainSc: 8          Complications: No complications documented.

## 2019-12-25 NOTE — Interval H&P Note (Signed)
History and Physical Interval Note: Patient presents today for the final phase of her staged bilateral ureteroscopy.  There are no changes to her history and physical.  12/25/2019 9:21 AM  Gloria [name]  has presented today for surgery, with the diagnosis of bilateral ureteral stones.  The various methods of treatment have been discussed with the patient and family. After consideration of risks, benefits and other options for treatment, the patient has consented to  Procedure(s): BILATERAL URETEROSCOPY/HOLMIUM LASER STONE EXTRACTION BILATERAL STENT PLACEMENT (Bilateral) as a surgical intervention.  The patient's history has been reviewed, patient examined, no change in status, stable for surgery.  I have reviewed the patient's chart and labs.  Questions were answered to the patient's satisfaction.     Ardis Hughs

## 2019-12-25 NOTE — Anesthesia Procedure Notes (Signed)
Procedure Name: LMA Insertion Date/Time: 12/25/2019 9:51 AM Performed by: Sharlette Dense, CRNA Patient Re-evaluated:Patient Re-evaluated prior to induction Oxygen Delivery Method: Circle system utilized Preoxygenation: Pre-oxygenation with 100% oxygen Induction Type: IV induction LMA: LMA inserted LMA Size: 4.0 Number of attempts: 1 Placement Confirmation: positive ETCO2 and breath sounds checked- equal and bilateral Tube secured with: Tape Dental Injury: Teeth and Oropharynx as per pre-operative assessment

## 2019-12-25 NOTE — Discharge Instructions (Signed)
DISCHARGE INSTRUCTIONS FOR KIDNEY STONE/URETERAL STENT   MEDICATIONS:  1.  Resume all your other meds from home - except do not take any extra narcotic pain meds that you may have at home.  2. Pyridium is to help with the burning/stinging when you urinate. 3. Tramadol is for moderate/severe pain, otherwise taking upto 1000 mg every 6 hours of plainTylenol will help treat your pain.     ACTIVITY:  1. No strenuous activity x 1week  2. No driving while on narcotic pain medications  3. Drink plenty of water  4. Continue to walk at home - you can still get blood clots when you are at home, so keep active, but don't over do it.  5. May return to work/school tomorrow or when you feel ready   BATHING:  1. You can shower and we recommend daily showers  2. You have a string coming from your urethra: The stent string is attached to your ureteral stent. Do not pull on this.   SIGNS/SYMPTOMS TO CALL:  Please call us if you have a fever greater than 101.5, uncontrolled nausea/vomiting, uncontrolled pain, dizziness, unable to urinate, bloody urine, chest pain, shortness of breath, leg swelling, leg pain, redness around wound, drainage from wound, or any other concerns or questions.   You can reach [place] at [phone].   FOLLOW-UP:  1. You have an appointment for stent removal in 1 week.

## 2019-12-26 ENCOUNTER — Encounter (HOSPITAL_COMMUNITY): Payer: Self-pay | Admitting: Urology

## 2019-12-27 DIAGNOSIS — N201 Calculus of ureter: Secondary | ICD-10-CM | POA: Diagnosis not present

## 2019-12-29 ENCOUNTER — Encounter (HOSPITAL_COMMUNITY)
Admission: EM | Disposition: A | Discharge status: Hospice | Payer: Self-pay | Source: Home / Self Care | Attending: Neurology

## 2019-12-29 ENCOUNTER — Emergency Department (HOSPITAL_COMMUNITY): Payer: PPO

## 2019-12-29 ENCOUNTER — Inpatient Hospital Stay (HOSPITAL_COMMUNITY)
Admission: EM | Admit: 2019-12-29 | Discharge: 2020-01-10 | DRG: 023 | Disposition: A | Discharge status: Hospice | Payer: PPO | Attending: Neurology | Admitting: Neurology

## 2019-12-29 ENCOUNTER — Emergency Department (HOSPITAL_COMMUNITY): Payer: PPO | Admitting: Anesthesiology

## 2019-12-29 ENCOUNTER — Inpatient Hospital Stay (HOSPITAL_COMMUNITY): Payer: PPO

## 2019-12-29 ENCOUNTER — Encounter (HOSPITAL_COMMUNITY): Payer: Self-pay | Admitting: Neurology

## 2019-12-29 DIAGNOSIS — D320 Benign neoplasm of cerebral meninges: Secondary | ICD-10-CM | POA: Diagnosis not present

## 2019-12-29 DIAGNOSIS — R0600 Dyspnea, unspecified: Secondary | ICD-10-CM

## 2019-12-29 DIAGNOSIS — I69391 Dysphagia following cerebral infarction: Secondary | ICD-10-CM

## 2019-12-29 DIAGNOSIS — Z91040 Latex allergy status: Secondary | ICD-10-CM

## 2019-12-29 DIAGNOSIS — I739 Peripheral vascular disease, unspecified: Secondary | ICD-10-CM | POA: Diagnosis present

## 2019-12-29 DIAGNOSIS — Y838 Other surgical procedures as the cause of abnormal reaction of the patient, or of later complication, without mention of misadventure at the time of the procedure: Secondary | ICD-10-CM | POA: Diagnosis not present

## 2019-12-29 DIAGNOSIS — G8194 Hemiplegia, unspecified affecting left nondominant side: Secondary | ICD-10-CM | POA: Diagnosis not present

## 2019-12-29 DIAGNOSIS — K5989 Other specified functional intestinal disorders: Secondary | ICD-10-CM | POA: Diagnosis not present

## 2019-12-29 DIAGNOSIS — E876 Hypokalemia: Secondary | ICD-10-CM | POA: Diagnosis not present

## 2019-12-29 DIAGNOSIS — I10 Essential (primary) hypertension: Secondary | ICD-10-CM | POA: Diagnosis not present

## 2019-12-29 DIAGNOSIS — Z9049 Acquired absence of other specified parts of digestive tract: Secondary | ICD-10-CM

## 2019-12-29 DIAGNOSIS — N132 Hydronephrosis with renal and ureteral calculous obstruction: Secondary | ICD-10-CM | POA: Diagnosis present

## 2019-12-29 DIAGNOSIS — R29723 NIHSS score 23: Secondary | ICD-10-CM | POA: Diagnosis present

## 2019-12-29 DIAGNOSIS — R1313 Dysphagia, pharyngeal phase: Secondary | ICD-10-CM | POA: Diagnosis present

## 2019-12-29 DIAGNOSIS — Z91030 Bee allergy status: Secondary | ICD-10-CM | POA: Diagnosis not present

## 2019-12-29 DIAGNOSIS — Z880 Allergy status to penicillin: Secondary | ICD-10-CM

## 2019-12-29 DIAGNOSIS — D62 Acute posthemorrhagic anemia: Secondary | ICD-10-CM | POA: Diagnosis not present

## 2019-12-29 DIAGNOSIS — M25552 Pain in left hip: Secondary | ICD-10-CM | POA: Diagnosis not present

## 2019-12-29 DIAGNOSIS — N1832 Chronic kidney disease, stage 3b: Secondary | ICD-10-CM | POA: Diagnosis present

## 2019-12-29 DIAGNOSIS — N1330 Unspecified hydronephrosis: Secondary | ICD-10-CM | POA: Diagnosis not present

## 2019-12-29 DIAGNOSIS — M7989 Other specified soft tissue disorders: Secondary | ICD-10-CM | POA: Diagnosis not present

## 2019-12-29 DIAGNOSIS — Z20822 Contact with and (suspected) exposure to covid-19: Secondary | ICD-10-CM | POA: Diagnosis present

## 2019-12-29 DIAGNOSIS — Z515 Encounter for palliative care: Secondary | ICD-10-CM | POA: Diagnosis not present

## 2019-12-29 DIAGNOSIS — I6601 Occlusion and stenosis of right middle cerebral artery: Secondary | ICD-10-CM | POA: Diagnosis present

## 2019-12-29 DIAGNOSIS — E871 Hypo-osmolality and hyponatremia: Secondary | ICD-10-CM | POA: Diagnosis not present

## 2019-12-29 DIAGNOSIS — Z7982 Long term (current) use of aspirin: Secondary | ICD-10-CM

## 2019-12-29 DIAGNOSIS — I4891 Unspecified atrial fibrillation: Secondary | ICD-10-CM | POA: Diagnosis not present

## 2019-12-29 DIAGNOSIS — K6389 Other specified diseases of intestine: Secondary | ICD-10-CM | POA: Diagnosis not present

## 2019-12-29 DIAGNOSIS — I351 Nonrheumatic aortic (valve) insufficiency: Secondary | ICD-10-CM | POA: Diagnosis not present

## 2019-12-29 DIAGNOSIS — H9190 Unspecified hearing loss, unspecified ear: Secondary | ICD-10-CM | POA: Diagnosis present

## 2019-12-29 DIAGNOSIS — R001 Bradycardia, unspecified: Secondary | ICD-10-CM | POA: Diagnosis not present

## 2019-12-29 DIAGNOSIS — Z6841 Body Mass Index (BMI) 40.0 and over, adult: Secondary | ICD-10-CM | POA: Diagnosis not present

## 2019-12-29 DIAGNOSIS — R310 Gross hematuria: Secondary | ICD-10-CM | POA: Diagnosis not present

## 2019-12-29 DIAGNOSIS — K56609 Unspecified intestinal obstruction, unspecified as to partial versus complete obstruction: Secondary | ICD-10-CM

## 2019-12-29 DIAGNOSIS — Z9889 Other specified postprocedural states: Secondary | ICD-10-CM | POA: Diagnosis not present

## 2019-12-29 DIAGNOSIS — Z66 Do not resuscitate: Secondary | ICD-10-CM | POA: Diagnosis not present

## 2019-12-29 DIAGNOSIS — Z7901 Long term (current) use of anticoagulants: Secondary | ICD-10-CM | POA: Diagnosis not present

## 2019-12-29 DIAGNOSIS — M1712 Unilateral primary osteoarthritis, left knee: Secondary | ICD-10-CM | POA: Diagnosis not present

## 2019-12-29 DIAGNOSIS — R52 Pain, unspecified: Secondary | ICD-10-CM | POA: Diagnosis not present

## 2019-12-29 DIAGNOSIS — I480 Paroxysmal atrial fibrillation: Secondary | ICD-10-CM | POA: Diagnosis present

## 2019-12-29 DIAGNOSIS — Z7189 Other specified counseling: Secondary | ICD-10-CM | POA: Diagnosis not present

## 2019-12-29 DIAGNOSIS — Z79899 Other long term (current) drug therapy: Secondary | ICD-10-CM

## 2019-12-29 DIAGNOSIS — Z87442 Personal history of urinary calculi: Secondary | ICD-10-CM

## 2019-12-29 DIAGNOSIS — R404 Transient alteration of awareness: Secondary | ICD-10-CM | POA: Diagnosis not present

## 2019-12-29 DIAGNOSIS — Z9911 Dependence on respirator [ventilator] status: Secondary | ICD-10-CM | POA: Diagnosis not present

## 2019-12-29 DIAGNOSIS — Z881 Allergy status to other antibiotic agents status: Secondary | ICD-10-CM

## 2019-12-29 DIAGNOSIS — I700 Atherosclerosis of aorta: Secondary | ICD-10-CM | POA: Diagnosis present

## 2019-12-29 DIAGNOSIS — S301XXD Contusion of abdominal wall, subsequent encounter: Secondary | ICD-10-CM | POA: Diagnosis not present

## 2019-12-29 DIAGNOSIS — E669 Obesity, unspecified: Secondary | ICD-10-CM | POA: Diagnosis present

## 2019-12-29 DIAGNOSIS — Z4659 Encounter for fitting and adjustment of other gastrointestinal appliance and device: Secondary | ICD-10-CM

## 2019-12-29 DIAGNOSIS — J9600 Acute respiratory failure, unspecified whether with hypoxia or hypercapnia: Secondary | ICD-10-CM | POA: Diagnosis not present

## 2019-12-29 DIAGNOSIS — D72829 Elevated white blood cell count, unspecified: Secondary | ICD-10-CM

## 2019-12-29 DIAGNOSIS — I63411 Cerebral infarction due to embolism of right middle cerebral artery: Principal | ICD-10-CM | POA: Diagnosis present

## 2019-12-29 DIAGNOSIS — I517 Cardiomegaly: Secondary | ICD-10-CM | POA: Diagnosis not present

## 2019-12-29 DIAGNOSIS — K567 Ileus, unspecified: Secondary | ICD-10-CM | POA: Diagnosis not present

## 2019-12-29 DIAGNOSIS — I129 Hypertensive chronic kidney disease with stage 1 through stage 4 chronic kidney disease, or unspecified chronic kidney disease: Secondary | ICD-10-CM | POA: Diagnosis not present

## 2019-12-29 DIAGNOSIS — R319 Hematuria, unspecified: Secondary | ICD-10-CM | POA: Diagnosis present

## 2019-12-29 DIAGNOSIS — G8929 Other chronic pain: Secondary | ICD-10-CM | POA: Diagnosis present

## 2019-12-29 DIAGNOSIS — I639 Cerebral infarction, unspecified: Secondary | ICD-10-CM | POA: Diagnosis not present

## 2019-12-29 DIAGNOSIS — H903 Sensorineural hearing loss, bilateral: Secondary | ICD-10-CM | POA: Diagnosis not present

## 2019-12-29 DIAGNOSIS — N1831 Chronic kidney disease, stage 3a: Secondary | ICD-10-CM | POA: Diagnosis not present

## 2019-12-29 DIAGNOSIS — I082 Rheumatic disorders of both aortic and tricuspid valves: Secondary | ICD-10-CM | POA: Diagnosis present

## 2019-12-29 DIAGNOSIS — R109 Unspecified abdominal pain: Secondary | ICD-10-CM

## 2019-12-29 DIAGNOSIS — N183 Chronic kidney disease, stage 3 unspecified: Secondary | ICD-10-CM | POA: Diagnosis not present

## 2019-12-29 DIAGNOSIS — E878 Other disorders of electrolyte and fluid balance, not elsewhere classified: Secondary | ICD-10-CM | POA: Diagnosis not present

## 2019-12-29 DIAGNOSIS — I119 Hypertensive heart disease without heart failure: Secondary | ICD-10-CM | POA: Diagnosis not present

## 2019-12-29 DIAGNOSIS — D3502 Benign neoplasm of left adrenal gland: Secondary | ICD-10-CM | POA: Diagnosis present

## 2019-12-29 DIAGNOSIS — I63511 Cerebral infarction due to unspecified occlusion or stenosis of right middle cerebral artery: Secondary | ICD-10-CM | POA: Diagnosis not present

## 2019-12-29 DIAGNOSIS — K560 Paralytic ileus: Secondary | ICD-10-CM | POA: Diagnosis not present

## 2019-12-29 DIAGNOSIS — I361 Nonrheumatic tricuspid (valve) insufficiency: Secondary | ICD-10-CM | POA: Diagnosis not present

## 2019-12-29 DIAGNOSIS — Z9181 History of falling: Secondary | ICD-10-CM

## 2019-12-29 DIAGNOSIS — Z6838 Body mass index (BMI) 38.0-38.9, adult: Secondary | ICD-10-CM

## 2019-12-29 DIAGNOSIS — I6523 Occlusion and stenosis of bilateral carotid arteries: Secondary | ICD-10-CM | POA: Diagnosis not present

## 2019-12-29 DIAGNOSIS — I724 Aneurysm of artery of lower extremity: Secondary | ICD-10-CM | POA: Diagnosis not present

## 2019-12-29 DIAGNOSIS — M25562 Pain in left knee: Secondary | ICD-10-CM

## 2019-12-29 DIAGNOSIS — I63311 Cerebral infarction due to thrombosis of right middle cerebral artery: Secondary | ICD-10-CM | POA: Diagnosis not present

## 2019-12-29 DIAGNOSIS — E7800 Pure hypercholesterolemia, unspecified: Secondary | ICD-10-CM | POA: Diagnosis not present

## 2019-12-29 DIAGNOSIS — L7632 Postprocedural hematoma of skin and subcutaneous tissue following other procedure: Secondary | ICD-10-CM | POA: Diagnosis not present

## 2019-12-29 DIAGNOSIS — R32 Unspecified urinary incontinence: Secondary | ICD-10-CM | POA: Diagnosis not present

## 2019-12-29 DIAGNOSIS — M1612 Unilateral primary osteoarthritis, left hip: Secondary | ICD-10-CM | POA: Diagnosis present

## 2019-12-29 DIAGNOSIS — D3501 Benign neoplasm of right adrenal gland: Secondary | ICD-10-CM | POA: Diagnosis present

## 2019-12-29 DIAGNOSIS — S301XXA Contusion of abdominal wall, initial encounter: Secondary | ICD-10-CM | POA: Diagnosis not present

## 2019-12-29 DIAGNOSIS — E785 Hyperlipidemia, unspecified: Secondary | ICD-10-CM | POA: Diagnosis present

## 2019-12-29 DIAGNOSIS — I270 Primary pulmonary hypertension: Secondary | ICD-10-CM | POA: Diagnosis not present

## 2019-12-29 DIAGNOSIS — E8770 Fluid overload, unspecified: Secondary | ICD-10-CM | POA: Diagnosis present

## 2019-12-29 DIAGNOSIS — R140 Abdominal distension (gaseous): Secondary | ICD-10-CM | POA: Diagnosis not present

## 2019-12-29 DIAGNOSIS — T148XXA Other injury of unspecified body region, initial encounter: Secondary | ICD-10-CM | POA: Diagnosis not present

## 2019-12-29 DIAGNOSIS — M25561 Pain in right knee: Secondary | ICD-10-CM | POA: Diagnosis present

## 2019-12-29 DIAGNOSIS — R1033 Periumbilical pain: Secondary | ICD-10-CM | POA: Diagnosis not present

## 2019-12-29 DIAGNOSIS — Z4682 Encounter for fitting and adjustment of non-vascular catheter: Secondary | ICD-10-CM | POA: Diagnosis not present

## 2019-12-29 DIAGNOSIS — Z466 Encounter for fitting and adjustment of urinary device: Secondary | ICD-10-CM | POA: Diagnosis not present

## 2019-12-29 DIAGNOSIS — Z888 Allergy status to other drugs, medicaments and biological substances status: Secondary | ICD-10-CM | POA: Diagnosis not present

## 2019-12-29 DIAGNOSIS — I454 Nonspecific intraventricular block: Secondary | ICD-10-CM | POA: Diagnosis present

## 2019-12-29 DIAGNOSIS — I482 Chronic atrial fibrillation, unspecified: Secondary | ICD-10-CM | POA: Diagnosis not present

## 2019-12-29 DIAGNOSIS — R29810 Facial weakness: Secondary | ICD-10-CM | POA: Diagnosis present

## 2019-12-29 DIAGNOSIS — D329 Benign neoplasm of meninges, unspecified: Secondary | ICD-10-CM | POA: Diagnosis not present

## 2019-12-29 DIAGNOSIS — I672 Cerebral atherosclerosis: Secondary | ICD-10-CM | POA: Diagnosis present

## 2019-12-29 DIAGNOSIS — R609 Edema, unspecified: Secondary | ICD-10-CM | POA: Diagnosis not present

## 2019-12-29 DIAGNOSIS — I6521 Occlusion and stenosis of right carotid artery: Secondary | ICD-10-CM | POA: Diagnosis not present

## 2019-12-29 DIAGNOSIS — I63431 Cerebral infarction due to embolism of right posterior cerebral artery: Secondary | ICD-10-CM | POA: Diagnosis not present

## 2019-12-29 DIAGNOSIS — R0602 Shortness of breath: Secondary | ICD-10-CM

## 2019-12-29 HISTORY — PX: RADIOLOGY WITH ANESTHESIA: SHX6223

## 2019-12-29 LAB — I-STAT CHEM 8, ED
BUN: 34 mg/dL — ABNORMAL HIGH (ref 8–23)
BUN: 26 mg/dL — ABNORMAL HIGH (ref 8–23)
Calcium, Ion: 1.06 mmol/L — ABNORMAL LOW (ref 1.15–1.40)
Calcium, Ion: 1.13 mmol/L — ABNORMAL LOW (ref 1.15–1.40)
Chloride: 106 mmol/L (ref 98–111)
Chloride: 108 mmol/L (ref 98–111)
Creatinine, Ser: 1.9 mg/dL — ABNORMAL HIGH (ref 0.44–1.00)
Creatinine, Ser: 1.5 mg/dL — ABNORMAL HIGH (ref 0.44–1.00)
Glucose, Bld: 103 mg/dL — ABNORMAL HIGH (ref 70–99)
Glucose, Bld: 134 mg/dL — ABNORMAL HIGH (ref 70–99)
HCT: 34 % — ABNORMAL LOW (ref 36.0–46.0)
HCT: 27 % — ABNORMAL LOW (ref 36.0–46.0)
Hemoglobin: 11.6 g/dL — ABNORMAL LOW (ref 12.0–15.0)
Hemoglobin: 9.2 g/dL — ABNORMAL LOW (ref 12.0–15.0)
Potassium: 5.2 mmol/L — ABNORMAL HIGH (ref 3.5–5.1)
Potassium: 4.4 mmol/L (ref 3.5–5.1)
Sodium: 140 mmol/L (ref 135–145)
Sodium: 139 mmol/L (ref 135–145)
TCO2: 25 mmol/L (ref 22–32)
TCO2: 22 mmol/L (ref 22–32)

## 2019-12-29 LAB — CBC
HCT: 38.3 % (ref 36.0–46.0)
Hemoglobin: 11.8 g/dL — ABNORMAL LOW (ref 12.0–15.0)
MCH: 32.2 pg (ref 26.0–34.0)
MCHC: 30.8 g/dL (ref 30.0–36.0)
MCV: 104.4 fL — ABNORMAL HIGH (ref 80.0–100.0)
Platelets: 225 10*3/uL (ref 150–400)
RBC: 3.67 MIL/uL — ABNORMAL LOW (ref 3.87–5.11)
RDW: 13.7 % (ref 11.5–15.5)
WBC: 5.8 10*3/uL (ref 4.0–10.5)
nRBC: 0 % (ref 0.0–0.2)

## 2019-12-29 LAB — COMPREHENSIVE METABOLIC PANEL
ALT: 11 U/L (ref 0–44)
AST: 17 U/L (ref 15–41)
Albumin: 3.2 g/dL — ABNORMAL LOW (ref 3.5–5.0)
Alkaline Phosphatase: 82 U/L (ref 38–126)
Anion gap: 11 (ref 5–15)
BUN: 32 mg/dL — ABNORMAL HIGH (ref 8–23)
CO2: 24 mmol/L (ref 22–32)
Calcium: 8.9 mg/dL (ref 8.9–10.3)
Chloride: 106 mmol/L (ref 98–111)
Creatinine, Ser: 1.76 mg/dL — ABNORMAL HIGH (ref 0.44–1.00)
GFR calc Af Amer: 32 mL/min — ABNORMAL LOW (ref >60)
GFR calc non Af Amer: 28 mL/min — ABNORMAL LOW (ref >60)
Glucose, Bld: 109 mg/dL — ABNORMAL HIGH (ref 70–99)
Potassium: 5.3 mmol/L — ABNORMAL HIGH (ref 3.5–5.1)
Sodium: 141 mmol/L (ref 135–145)
Total Bilirubin: 0.6 mg/dL (ref 0.3–1.2)
Total Protein: 6.2 g/dL — ABNORMAL LOW (ref 6.5–8.1)

## 2019-12-29 LAB — DIFFERENTIAL
Abs Immature Granulocytes: 0.01 10*3/uL (ref 0.00–0.07)
Basophils Absolute: 0 10*3/uL (ref 0.0–0.1)
Basophils Relative: 1 %
Eosinophils Absolute: 0.4 10*3/uL (ref 0.0–0.5)
Eosinophils Relative: 6 %
Immature Granulocytes: 0 %
Lymphocytes Relative: 15 %
Lymphs Abs: 0.9 10*3/uL (ref 0.7–4.0)
Monocytes Absolute: 0.7 10*3/uL (ref 0.1–1.0)
Monocytes Relative: 12 %
Neutro Abs: 3.9 10*3/uL (ref 1.7–7.7)
Neutrophils Relative %: 66 %

## 2019-12-29 LAB — PROTIME-INR
INR: 1.1 (ref 0.8–1.2)
Prothrombin Time: 13.3 seconds (ref 11.4–15.2)

## 2019-12-29 LAB — SARS CORONAVIRUS 2 BY RT PCR (HOSPITAL ORDER, PERFORMED IN ~~LOC~~ HOSPITAL LAB): SARS Coronavirus 2: NEGATIVE

## 2019-12-29 LAB — CBG MONITORING, ED: Glucose-Capillary: 106 mg/dL — ABNORMAL HIGH (ref 70–99)

## 2019-12-29 LAB — APTT: aPTT: 25 seconds (ref 24–36)

## 2019-12-29 SURGERY — IR WITH ANESTHESIA
Anesthesia: General

## 2019-12-29 MED ORDER — EPTIFIBATIDE 20 MG/10ML IV SOLN
INTRAVENOUS | Status: AC
  Filled 2019-12-29: qty 10

## 2019-12-29 MED ORDER — SODIUM CHLORIDE 0.9 % IV SOLN: INTRAVENOUS | Status: DC

## 2019-12-29 MED ORDER — TIROFIBAN HCL IN NACL 5-0.9 MG/100ML-% IV SOLN
INTRAVENOUS | Status: AC
  Filled 2019-12-29: qty 100

## 2019-12-29 MED ORDER — ACETAMINOPHEN 325 MG PO TABS: 650.0000 mg | ORAL_TABLET | ORAL | Status: DC | PRN

## 2019-12-29 MED ORDER — STROKE: EARLY STAGES OF RECOVERY BOOK
Freq: Once | Status: DC
  Filled 2019-12-29: qty 1

## 2019-12-29 MED ORDER — ACETAMINOPHEN 650 MG RE SUPP: 650.0000 mg | RECTAL | Status: DC | PRN

## 2019-12-29 MED ORDER — SODIUM CHLORIDE 0.9% FLUSH: 3.0000 mL | Freq: Once | INTRAVENOUS | Status: DC

## 2019-12-29 MED ORDER — DIPHENHYDRAMINE HCL 25 MG PO CAPS: 50.0000 mg | ORAL_CAPSULE | Freq: Once | ORAL | Status: DC

## 2019-12-29 MED ORDER — ALTEPLASE (STROKE) FULL DOSE INFUSION
0.9000 mg/kg | Freq: Once | INTRAVENOUS | Status: AC
  Administered 2019-12-29: 86.1 mg via INTRAVENOUS
  Filled 2019-12-29: qty 100

## 2019-12-29 MED ORDER — SUCCINYLCHOLINE CHLORIDE 200 MG/10ML IV SOSY
PREFILLED_SYRINGE | INTRAVENOUS | Status: DC | PRN
  Administered 2019-12-29: 120 mg via INTRAVENOUS

## 2019-12-29 MED ORDER — LIDOCAINE 2% (20 MG/ML) 5 ML SYRINGE
INTRAMUSCULAR | Status: DC | PRN
  Administered 2019-12-29: 60 mg via INTRAVENOUS

## 2019-12-29 MED ORDER — CLOPIDOGREL BISULFATE 300 MG PO TABS
ORAL_TABLET | ORAL | Status: AC
  Filled 2019-12-29: qty 1

## 2019-12-29 MED ORDER — SENNOSIDES-DOCUSATE SODIUM 8.6-50 MG PO TABS: 1.0000 | ORAL_TABLET | Freq: Every evening | ORAL | Status: DC | PRN

## 2019-12-29 MED ORDER — LACTATED RINGERS IV SOLN: INTRAVENOUS | Status: DC | PRN

## 2019-12-29 MED ORDER — EPTIFIBATIDE 20 MG/10ML IV SOLN
INTRAVENOUS | Status: DC | PRN
  Administered 2019-12-29 (×4): 2 mg via INTRAVENOUS

## 2019-12-29 MED ORDER — CHLORHEXIDINE GLUCONATE CLOTH 2 % EX PADS
6.0000 | MEDICATED_PAD | Freq: Every day | CUTANEOUS | Status: DC
  Administered 2019-12-30 – 2020-01-07 (×9): 6 via TOPICAL

## 2019-12-29 MED ORDER — LABETALOL HCL 5 MG/ML IV SOLN: 20.0000 mg | Freq: Once | INTRAVENOUS | Status: DC

## 2019-12-29 MED ORDER — SODIUM CHLORIDE (PF) 0.9 % IJ SOLN
INTRAVENOUS | Status: DC | PRN
  Administered 2019-12-29 (×2): 25 ug via INTRA_ARTERIAL

## 2019-12-29 MED ORDER — SODIUM CHLORIDE 0.9 % IV SOLN: INTRAVENOUS | Status: DC | PRN

## 2019-12-29 MED ORDER — PHENYLEPHRINE HCL-NACL 10-0.9 MG/250ML-% IV SOLN
INTRAVENOUS | Status: DC | PRN
  Administered 2019-12-29: 50 ug/min via INTRAVENOUS

## 2019-12-29 MED ORDER — METHYLPREDNISOLONE SODIUM SUCC 125 MG IJ SOLR
INTRAMUSCULAR | Status: AC
  Administered 2019-12-29: 125 mg via INTRAVENOUS
  Filled 2019-12-29: qty 2

## 2019-12-29 MED ORDER — ACETAMINOPHEN 160 MG/5ML PO SOLN
650.0000 mg | ORAL | Status: DC | PRN
  Administered 2020-01-03 – 2020-01-09 (×2): 650 mg
  Filled 2019-12-29 (×2): qty 20.3

## 2019-12-29 MED ORDER — ASPIRIN 81 MG PO CHEW
CHEWABLE_TABLET | ORAL | Status: AC
  Filled 2019-12-29: qty 1

## 2019-12-29 MED ORDER — VANCOMYCIN HCL IN DEXTROSE 1-5 GM/200ML-% IV SOLN
INTRAVENOUS | Status: AC
  Filled 2019-12-29: qty 200

## 2019-12-29 MED ORDER — ONDANSETRON HCL 4 MG/2ML IJ SOLN
INTRAMUSCULAR | Status: DC | PRN
  Administered 2019-12-29: 4 mg via INTRAVENOUS

## 2019-12-29 MED ORDER — CLEVIDIPINE BUTYRATE 0.5 MG/ML IV EMUL: 0.0000 mg/h | INTRAVENOUS | Status: DC

## 2019-12-29 MED ORDER — METHYLPREDNISOLONE SODIUM SUCC 125 MG IJ SOLR: 125.0000 mg | Freq: Once | INTRAMUSCULAR | Status: AC

## 2019-12-29 MED ORDER — IOHEXOL 300 MG/ML  SOLN
150.0000 mL | Freq: Once | INTRAMUSCULAR | Status: AC | PRN
  Administered 2019-12-29: 75 mL via INTRA_ARTERIAL

## 2019-12-29 MED ORDER — DIPHENHYDRAMINE HCL 50 MG/ML IJ SOLN: 50.0000 mg | Freq: Once | INTRAMUSCULAR | Status: DC

## 2019-12-29 MED ORDER — SODIUM CHLORIDE 0.9 % IV SOLN
50.0000 mL | Freq: Once | INTRAVENOUS | Status: AC
  Administered 2019-12-29: 50 mL via INTRAVENOUS

## 2019-12-29 MED ORDER — IOHEXOL 240 MG/ML SOLN
INTRAMUSCULAR | Status: AC
  Filled 2019-12-29: qty 200

## 2019-12-29 MED ORDER — NITROGLYCERIN 1 MG/10 ML FOR IR/CATH LAB
INTRA_ARTERIAL | Status: AC
  Filled 2019-12-29: qty 10

## 2019-12-29 MED ORDER — IOHEXOL 350 MG/ML SOLN
75.0000 mL | Freq: Once | INTRAVENOUS | Status: AC | PRN
  Administered 2019-12-29: 75 mL via INTRAVENOUS

## 2019-12-29 MED ORDER — VANCOMYCIN HCL 1000 MG IV SOLR
INTRAVENOUS | Status: DC | PRN
  Administered 2019-12-29: 1000 mg via INTRAVENOUS

## 2019-12-29 MED ORDER — TICAGRELOR 90 MG PO TABS
ORAL_TABLET | ORAL | Status: AC
  Filled 2019-12-29: qty 2

## 2019-12-29 MED ORDER — CLEVIDIPINE BUTYRATE 0.5 MG/ML IV EMUL
INTRAVENOUS | Status: AC
  Filled 2019-12-29: qty 50

## 2019-12-29 MED ORDER — TICAGRELOR 90 MG PO TABS
ORAL_TABLET | ORAL | Status: AC
  Filled 2019-12-29: qty 1

## 2019-12-29 MED ORDER — PROPOFOL 10 MG/ML IV BOLUS
INTRAVENOUS | Status: DC | PRN
  Administered 2019-12-29: 120 mg via INTRAVENOUS

## 2019-12-29 MED ORDER — ROCURONIUM BROMIDE 10 MG/ML (PF) SYRINGE
PREFILLED_SYRINGE | INTRAVENOUS | Status: DC | PRN
  Administered 2019-12-29: 20 mg via INTRAVENOUS
  Administered 2019-12-29 (×3): 10 mg via INTRAVENOUS
  Administered 2019-12-29: 40 mg via INTRAVENOUS
  Administered 2019-12-29: 10 mg via INTRAVENOUS

## 2019-12-29 MED ORDER — ASPIRIN 300 MG RE SUPP
300.0000 mg | Freq: Once | RECTAL | Status: AC
  Administered 2019-12-30: 300 mg via RECTAL
  Filled 2019-12-29: qty 1

## 2019-12-29 MED ORDER — DIPHENHYDRAMINE HCL 50 MG/ML IJ SOLN
INTRAMUSCULAR | Status: AC
  Filled 2019-12-29: qty 1

## 2019-12-29 MED ORDER — PHENYLEPHRINE HCL (PRESSORS) 10 MG/ML IV SOLN
INTRAVENOUS | Status: DC | PRN
  Administered 2019-12-29: 40 ug via INTRAVENOUS
  Administered 2019-12-29: 80 ug via INTRAVENOUS
  Administered 2019-12-29: 120 ug via INTRAVENOUS
  Administered 2019-12-29 (×5): 40 ug via INTRAVENOUS

## 2019-12-29 MED ORDER — VERAPAMIL HCL 2.5 MG/ML IV SOLN
INTRAVENOUS | Status: AC
  Filled 2019-12-29: qty 2

## 2019-12-29 MED ORDER — PANTOPRAZOLE SODIUM 40 MG IV SOLR
40.0000 mg | Freq: Every day | INTRAVENOUS | Status: DC
  Administered 2019-12-30: 40 mg via INTRAVENOUS
  Filled 2019-12-29: qty 40

## 2019-12-29 MED ORDER — IOHEXOL 300 MG/ML  SOLN
150.0000 mL | Freq: Once | INTRAMUSCULAR | Status: AC | PRN
  Administered 2019-12-29: 50 mL via INTRA_ARTERIAL

## 2019-12-29 NOTE — Procedures (Signed)
S/P RT common carotid arteriogram followed by revascularization of occluded RT MCA M 1 seg with x2 passes with solitaire83mm x 40 mm retriever,x 1 pass with embotrap 5 mm x 37 mm and X1 pass with 2mm x 20 mm solitaire retriever and penumbra aspiration achieving a TICI 2B revascularization. 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 Post procedure CT brain. RT CFA puncture closed with a 1F exoseal and manual compression. Distal pulses all dopplerable and unchained. Also severe FMD changes in the RT ICA ..MOderate to severe tortuosity of  The RT common iliac artery. 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 Present low BP on pressors. RTT groin firm medially over the pubic bone. Patient to stat CT of the abdomen and pelvis and CT brain S.DEvehwar MD

## 2019-12-29 NOTE — ED Notes (Signed)
IR staff preparing to intubate pt.

## 2019-12-29 NOTE — ED Provider Notes (Signed)
Patient brought in by EMS as a code stroke. Dr. Sherry Ruffing assessed the patient at the bridge and cleared airway. Left sided deficits. Patient was taken to the CT scanner, found to be a candidate for TPA and IR intervention. Patient was unable to be formally assessed by EDP as a result.    Gloria [name] 12/29/19 1914    Tegeler, Gwenyth Allegra, MD 12/30/19 [phone]

## 2019-12-29 NOTE — ED Notes (Signed)
Report given to IR RN

## 2019-12-29 NOTE — Consult Note (Signed)
NAME:  Gloria [name], MRN:  [id number], DOB:  1946-07-04, LOS: 0 ADMISSION DATE:  12/29/2019, CONSULTATION DATE:  12/29/2019 REFERRING MD:  Neurology, CHIEF COMPLAINT:  Left sided weakness  Brief History   74 year old woman with history of hypertension, A. fib not on anticoagulation presenting with left-sided weakness and right gaze preference. 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 Now s/p tPA, revascularization of right MCA M1 segment as well as placement of rescue stent in the right MCA.  Postprocedure has hematoma within the right inguinal region extending along the inguinal crease into the pannus of the right lower quadrant abdominal wall.  No extension into the retroperitoneal space.  History of present illness   74 year old woman with history of hypertension, A. fib not on anticoagulation presenting with left-sided weakness and right gaze preference. History obtained from EMR and nursing due to patient intubated/sedated.  Per EMR she was last known normal at [facility].  She was found by her brother when he returned from the store lying on the ground.  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  A code stroke was called.  CT head was negative for hemorrhage.  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 She was given tPA. CTA showed right M1 occlusion.  She underwent revascularization of right MCA M1 segment as well as placement of rescue stent in the right MCA.  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  She was sent for CT abdomen pelvis without contrast.  There is a hematoma within the right inguinal region extending along the inguinal crease into the pannus of the right lower quadrant abdominal wall.  No extension into the retroperitoneal space.  Past Medical History  hypertension, A. fib not on anticoagulation   Significant Hospital Events   6/27> admit, tPA, revascularization of right M1 occlusion as well as stent placement  Consults:  PCCM 6/27  Procedures:  ETT  6/27>  Significant Diagnostic Tests:  CT abd/pelvis w/o contrast 6/27>>Hematoma within the right inguinal region extending along the inguinal crease into the pannus of the right lower quadrant abdominal wall. No extension into the retroperitoneal space.Bilateral ureteral stents. Stable bilateral adrenal adenoma.  CT head w/o contrast 6/27>>Hyperdense material in the right subarachnoid space, overlying the frontal operculum and within the Sylvian fissure. Unchanged left parafalcine meningioma.  CTA head/neck 6/27>>Occlusion of the right M1 MCA. However, there is good filling of the more distal MCA territory. No hemodynamically significant stenosis in the neck.  Micro Data:  COVID19 6/27>> neg  Antimicrobials:  None  Interim history/subjective:  Intubated/sedated  Objective   Blood pressure 134/86, pulse 82, resp. rate (!) 25, weight 95.7 kg, SpO2 92 %.        Intake/Output Summary (Last 24 hours) at 12/29/2019 2354 Last data filed at 12/29/2019 2137 Gross per 24 hour  Intake 750 ml  Output 250 ml  Net 500 ml   Filed Weights   12/29/19 1800  Weight: 95.7 kg    Examination: General: NAD HENT: [place]/AT, ETT in place Lungs: No wheezes Cardiovascular: RRR Abdomen: Soft, nondistended Extremities: No LE edema Neuro: Sedated  Assessment & Plan:  74 year old woman with history of hypertension, A. fib not on anticoagulation presenting with left-sided weakness and right gaze preference. 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 Now s/p tPA, revascularization of right MCA M1 segment as well as placement of rescue stent in the right MCA.  Postprocedure has hematoma within the right inguinal region extending along the inguinal crease into the pannus  of the right lower quadrant abdominal wall.  No extension into the retroperitoneal space.  Acute respiratory insufficiency: Intubated for airway protection --Obtain CXR --Continue vent support. SBT/SAT in AM as able --Wean O2 for goal O2 sat  >89% --Monitor blood gas  MCA M1 occlusion, CVA:  --BP goal per Neuro IR SBP 120-140  --f/u MRI Brain, repeat head CTs per primary --Echocardiogram --HgbA1c, fasting lipid panel --PT consult, OT consult, Speech consult --Telemetry monitoring --Frequent neuro checks --Stroke swallow screen   Post procedure hematoma: Monitor distal pulses  History of atrial fibrillation, HTN: CHA2DS2-VASc score of at least 5 --Will eventually need to be started on anticoagulation  Best practice:  Diet: NPO Pain/Anxiety/Delirium protocol (if indicated): Propofol gtt, fent prn VAP protocol (if indicated): Ordered DVT prophylaxis: SCDs GI prophylaxis: PPI Glucose control: Monitor Mobility: PT/OT when able Code Status: FULL Family Communication: None at bedside Disposition: Admitted to Neuro ICU  Labs   CBC: Recent Labs  Lab 12/23/19 0935 12/29/19 1802 12/29/19 1846 12/29/19 2300  WBC 3.9* 5.8  --   --   NEUTROABS  --  3.9  --   --   HGB 11.8* 11.8* 11.6* 9.2*  HCT 38.1 38.3 34.0* 27.0*  MCV 104.7* 104.4*  --   --   PLT 224 225  --   --     Basic Metabolic Panel: Recent Labs  Lab 12/23/19 0935 12/29/19 1802 12/29/19 1846 12/29/19 2300  NA 143 141 140 139  K 4.8 5.3* 5.2* 4.4  CL 108 106 106 108  CO2 25 24  --   --   GLUCOSE 96 109* 103* 134*  BUN 26* 32* 34* 26*  CREATININE 1.49* 1.76* 1.90* 1.50*  CALCIUM 9.0 8.9  --   --    GFR: Estimated Creatinine Clearance: 35.5 mL/min (A) (by C-G formula based on SCr of 1.5 mg/dL (H)). Recent Labs  Lab 12/23/19 0935 12/29/19 1802  WBC 3.9* 5.8    Liver Function Tests: Recent Labs  Lab 12/29/19 1802  AST 17  ALT 11  ALKPHOS 82  BILITOT 0.6  PROT 6.2*  ALBUMIN 3.2*   ABG    Component Value Date/Time   TCO2 22 12/29/2019 2300     Coagulation Profile: Recent Labs  Lab 12/29/19 1802  INR 1.1   CBG: Recent Labs  Lab 12/29/19 1755  GLUCAP 106*    Review of Systems:   Unable to obtain due to  intubation/sedation  Past Medical History  She,  has a past medical history of Arthritis, Atrial fibrillation ([place]), Dysrhythmia, History of kidney stones, HOH (hard of hearing), and Hypertension.   Surgical History    Past Surgical History:  Procedure Laterality Date  . CHOLECYSTECTOMY    . CYSTOSCOPY WITH STENT PLACEMENT Bilateral 11/09/2019   Procedure: CYSTOSCOPY BILATERAL URETERAL STENT PLACEMENT;  Surgeon: Ardis Hughs, MD;  Location: [facility];  Service: Urology;  Laterality: Bilateral;  . CYSTOSCOPY/URETEROSCOPY/HOLMIUM LASER/STENT PLACEMENT Right 11/22/2019   Procedure: CYSTOSCOPY RIGHT URETEROSCOPY/HOLMIUM LASER STONE EXTRACTION /STENT EXCHANGE;  Surgeon: Ardis Hughs, MD;  Location: [facility];  Service: Urology;  Laterality: Right;  . CYSTOSCOPY/URETEROSCOPY/HOLMIUM LASER/STENT PLACEMENT Left 12/06/2019   Procedure: CYSTOSCOPY BILATERAL URETEROSCOPY/HOLMIUM LASER/STENT EXCHANGE;  Surgeon: Ardis Hughs, MD;  Location: [facility];  Service: Urology;  Laterality: Left;  . CYSTOSCOPY/URETEROSCOPY/HOLMIUM LASER/STENT PLACEMENT Bilateral 12/25/2019   Procedure: BILATERAL URETEROSCOPY/HOLMIUM LASER LEFT, BILATERAL STONE EXTRACTION BILATERAL STENT EXCHANGE;  Surgeon: Ardis Hughs, MD;  Location: [facility];  Service: Urology;  Laterality: Bilateral;  Social History   reports that she has never smoked. She has never used smokeless tobacco. She reports that she does not drink alcohol and does not use drugs.   Family History   Her family history is not on file.   Allergies Allergies  Allergen Reactions  . Aspartame And Phenylalanine Other (See Comments)    unknown  . Azithromycin Other (See Comments)    Unknown  . Bee Venom Other (See Comments)    Hyper  . Cortizone-5 [Hydrocortisone] Itching and Swelling  . Olmesartan Other (See Comments)    unknown  . Other Other (See Comments)    IV dye Dyes - unknown reaction   . Penicillins Other (See Comments)    unknown   . Valsartan Other (See Comments)    Unknown  . Latex Rash and Other (See Comments)    Skin peels     Home Medications  Prior to Admission medications   Medication Sig Start Date End Date Taking? Authorizing Provider  aspirin 325 MG tablet Take 325 mg by mouth daily.   Yes [provider]  carvedilol (COREG) 6.25 MG tablet Take 6.25 mg by mouth 2 (two) times daily. 11/08/19  Yes [provider]  furosemide (LASIX) 20 MG tablet Take 1 tablet (20 mg total) by mouth as needed for fluid. Patient taking differently: Take 20 mg by mouth daily as needed for fluid.  11/15/19  Yes Pokhrel, Laxman, MD  magnesium oxide (MAG-OX) 400 MG tablet Take 400 mg by mouth 2 (two) times daily. 11/12/19  Yes [provider]  traMADol (ULTRAM) 50 MG tablet Take 1-2 tablets (50-100 mg total) by mouth every 6 (six) hours as needed for moderate pain. 12/06/19  Yes Ardis Hughs, MD  mirabegron ER (MYRBETRIQ) 25 MG TB24 tablet Take 1 tablet (25 mg total) by mouth daily. 11/22/19   Ardis Hughs, MD     Critical care time: The patient is critically ill with multiple organ systems failure and requires high complexity decision making for assessment and support, frequent evaluation and titration of therapies, application of advanced monitoring technologies and extensive interpretation of multiple databases.   Critical Care Time devoted to patient care services described in this note is  35 Minutes. This time reflects time of care of this signee. This critical care time does not reflect procedure time, or teaching time or supervisory time of PA/NP/Med student/Med Resident etc but could involve care discussion time.  Jacques Earthly, M.D. [facility] Pulmonary/Critical Care Medicine After hours pager: [phone].

## 2019-12-29 NOTE — Anesthesia Procedure Notes (Signed)
Procedure Name: Intubation Date/Time: 12/29/2019 6:50 PM Performed by: Suzy Bouchard, CRNA Pre-anesthesia Checklist: Patient identified, Emergency Drugs available, Suction available, Patient being monitored and Timeout performed Patient Re-evaluated:Patient Re-evaluated prior to induction Oxygen Delivery Method: Ambu bag Preoxygenation: Pre-oxygenation with 100% oxygen Induction Type: IV induction and Rapid sequence Laryngoscope Size: McGraph and 3 Grade View: Grade I Tube type: Oral Tube size: 7.5 mm Number of attempts: 1 Airway Equipment and Method: Stylet and Video-laryngoscopy Placement Confirmation: ETT inserted through vocal cords under direct vision,  breath sounds checked- equal and bilateral and positive ETCO2 Secured at: 23 cm Tube secured with: Tape Dental Injury: Teeth and Oropharynx as per pre-operative assessment

## 2019-12-29 NOTE — ED Triage Notes (Signed)
Pt bib ems as a code stroke from home. LKW 1645. Per ems, brother was with pt and left to go to the store. When he came back, he found her unresponsive on the floor. Pt is deaf. L sided weakness, L facial droop, R sided gaze, nonverbal on arrival. EDP saw patient at the bridge, cleared for CT. 180/100 HR 80 RR 25 CBG 106 94% RA

## 2019-12-29 NOTE — H&P (Addendum)
NEURO HOSPITALIST  H&P   Chief Complaint: left side flaccid/ right gaze  History obtained from:  EMS / family HPI:                                                                                                                                         Gloria [name] is an 74 y.o. female PMH HTN, a. Fib ( not anticoagulated) presented to [facility] ED as a code stroke for c/o left side flaccid, right gaze.  Per EMS patient was at home with her brother and was at her baseline at 74 when he left to go to the store. When he came back she was lying on the ground with her left side flaccid. 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 Patient is not on any blood thinners. Over the past 6 weeks has had multiple admissions for bilateral ureter stents.   ED course:  CTH: did not reveal a hemorrhage.  Left frontal meningioma CTA: right M1 occlusion BP: 160/81 BG: 106  Date last known well: 12/29/19 Time last known well: 1645 tPA Given: Yes, started at 1630 Modified Rankin: Rankin Score=3 NIHSS:23     Past Medical History:  Diagnosis Date  . Arthritis    knees  . Atrial fibrillation ([place])   . Dysrhythmia   . History of kidney stones   . HOH (hard of hearing)   . Hypertension     Past Surgical History:  Procedure Laterality Date  . CHOLECYSTECTOMY    . CYSTOSCOPY WITH STENT PLACEMENT Bilateral 11/09/2019   Procedure: CYSTOSCOPY BILATERAL URETERAL STENT PLACEMENT;  Surgeon: Ardis Hughs, MD;  Location: [facility];  Service: Urology;  Laterality: Bilateral;  . CYSTOSCOPY/URETEROSCOPY/HOLMIUM LASER/STENT PLACEMENT Right 11/22/2019   Procedure: CYSTOSCOPY RIGHT URETEROSCOPY/HOLMIUM LASER STONE EXTRACTION /STENT EXCHANGE;  Surgeon: Ardis Hughs, MD;  Location: [facility];  Service: Urology;  Laterality: Right;  . CYSTOSCOPY/URETEROSCOPY/HOLMIUM LASER/STENT PLACEMENT Left 12/06/2019   Procedure: CYSTOSCOPY BILATERAL URETEROSCOPY/HOLMIUM LASER/STENT EXCHANGE;   Surgeon: Ardis Hughs, MD;  Location: [facility];  Service: Urology;  Laterality: Left;  . CYSTOSCOPY/URETEROSCOPY/HOLMIUM LASER/STENT PLACEMENT Bilateral 12/25/2019   Procedure: BILATERAL URETEROSCOPY/HOLMIUM LASER LEFT, BILATERAL STONE EXTRACTION BILATERAL STENT EXCHANGE;  Surgeon: Ardis Hughs, MD;  Location: [facility];  Service: Urology;  Laterality: Bilateral;    No family history on file.      Social History:  reports that she has never smoked. She has never used smokeless tobacco. She reports that she does not drink alcohol and does not use drugs.  Allergies:  Allergies  Allergen Reactions  . Aspartame And Phenylalanine Other (See Comments)    unknown  .  Azithromycin Other (See Comments)    Unknown  . Bee Venom Other (See Comments)    Hyper  . Cortizone-5 [Hydrocortisone] Itching and Swelling  . Olmesartan Other (See Comments)    unknown  . Other Other (See Comments)    IV dye Dyes - unknown reaction   . Penicillins Other (See Comments)    unknown  . Valsartan Other (See Comments)    Unknown  . Latex Rash and Other (See Comments)    Skin peels    Medications:                                                                                                                           Current Facility-Administered Medications  Medication Dose Route Frequency Provider Last Rate Last Admin  .  stroke: mapping our early stages of recovery book   Does not apply Once Vonzella Nipple, NP      . alteplase (ACTIVASE) 1 mg/mL infusion 86.1 mg  0.9 mg/kg Intravenous Once Kaylan Friedmann R, MD       Followed by  . 0.9 %  sodium chloride infusion  50 mL Intravenous Once Onaje Warne R, MD      . 0.9 %  sodium chloride infusion   Intravenous Continuous Vonzella Nipple, NP      . acetaminophen (TYLENOL) tablet 650 mg  650 mg Oral Q4H PRN Vonzella Nipple, NP       Or  . acetaminophen (TYLENOL) 160 MG/5ML solution 650 mg  650 mg Per Tube Q4H PRN Vonzella Nipple,  NP       Or  . acetaminophen (TYLENOL) suppository 650 mg  650 mg Rectal Q4H PRN Vonzella Nipple, NP      . aspirin 81 MG chewable tablet           . labetalol (NORMODYNE) injection 20 mg  20 mg Intravenous Once Vonzella Nipple, NP       And  . clevidipine (CLEVIPREX) infusion 0.5 mg/mL  0-21 mg/hr Intravenous Continuous Vonzella Nipple, NP      . clopidogrel (PLAVIX) 300 MG tablet           . diphenhydrAMINE (BENADRYL) 50 MG/ML injection           . diphenhydrAMINE (BENADRYL) capsule 50 mg  50 mg Oral Once Shanele Nissan, Karena Addison R, MD       Or  . diphenhydrAMINE (BENADRYL) injection 50 mg  50 mg Intravenous Once Lazaria Schaben, Karena Addison R, MD      . eptifibatide (INTEGRILIN) 20 MG/10ML injection           . iohexol (OMNIPAQUE) 240 MG/ML injection           . methylPREDNISolone sodium succinate (SOLU-MEDROL) 125 mg/2 mL injection 125 mg  125 mg Intravenous Once Natassja Ollis R, MD      . methylPREDNISolone sodium succinate (SOLU-MEDROL) 125 mg/2 mL injection           .  pantoprazole (PROTONIX) injection 40 mg  40 mg Intravenous QHS Vonzella Nipple, NP      . senna-docusate (Senokot-S) tablet 1 tablet  1 tablet Oral QHS PRN Vonzella Nipple, NP      . sodium chloride flush (NS) 0.9 % injection 3 mL  3 mL Intravenous Once Noemi Chapel, MD      . ticagrelor (BRILINTA) 90 MG tablet           . tirofiban (AGGRASTAT) 5-0.9 MG/100ML-% injection           . verapamil (ISOPTIN) 2.5 MG/ML injection            Current Outpatient Medications  Medication Sig Dispense Refill  . aspirin 325 MG tablet Take 325 mg by mouth daily.    . carvedilol (COREG) 6.25 MG tablet Take 6.25 mg by mouth 2 (two) times daily.    . furosemide (LASIX) 20 MG tablet Take 1 tablet (20 mg total) by mouth as needed for fluid. (Patient taking differently: Take 20 mg by mouth daily as needed for fluid. )  0  . mirabegron ER (MYRBETRIQ) 25 MG TB24 tablet Take 1 tablet (25 mg total) by mouth daily.    . traMADol (ULTRAM) 50  MG tablet Take 1-2 tablets (50-100 mg total) by mouth every 6 (six) hours as needed for moderate pain. 15 tablet 0   Facility-Administered Medications Ordered in Other Encounters  Medication Dose Route Frequency Provider Last Rate Last Admin  . lactated ringers infusion   Intravenous Continuous PRN Suzy Bouchard, CRNA   New Bag at 12/29/19 1839  . lidocaine 2% (20 mg/mL) 5 mL syringe   Intravenous Anesthesia Intra-op Suzy Bouchard, CRNA   60 mg at 12/29/19 1849  . propofol (DIPRIVAN) 10 mg/mL bolus/IV push   Intravenous Anesthesia Intra-op Suzy Bouchard, CRNA   120 mg at 12/29/19 1849  . succinylcholine (ANECTINE) syringe   Intravenous Anesthesia Intra-op Suzy Bouchard, CRNA   120 mg at 12/29/19 1849     ROS:                                                                                                                                       Unable to obtain d/t AMS   General Examination:                                                                                                      There were no vitals taken for this  visit.  Physical Exam  Constitutional: Appears well-developed and well-nourished.  Eyes: Normal external eye and conjunctiva. HENT: Normocephalic, no lesions, without obvious abnormality.   Musculoskeletal-no joint tenderness, deformity or swelling Cardiovascular: Normal rate and regular rhythm.  Respiratory: Effort normal, non-labored breathing saturations WNL GI: Soft.  No distension. There is no tenderness.  Skin: WDI  Neurological Examination Mental Status: Patient is deaf at baseline, attempted use of ASL interpreters for exam but patient did not/ would not open eyes.  Cranial Nerves: Right gaze deviation. Tongue appears midline Motor: Flaccid on the left. RUE able to raise anti gravity. RLE 2/5 Tone and bulk:normal tone throughout; no atrophy noted Sensory: neglecting left side Cerebellar: UTA Gait: deferred   Lab Results: Basic Metabolic  Panel: Recent Labs  Lab 12/23/19 0935  NA 143  K 4.8  CL 108  CO2 25  GLUCOSE 96  BUN 26*  CREATININE 1.49*  CALCIUM 9.0    CBC: Recent Labs  Lab 12/23/19 0935  WBC 3.9*  HGB 11.8*  HCT 38.1  MCV 104.7*  PLT 224   CBG: Recent Labs  Lab 12/29/19 1755  GLUCAP 106*    Imaging: CT HEAD CODE STROKE WO CONTRAST  Result Date: 12/29/2019 CLINICAL DATA:  Code stroke.  Left-sided weakness EXAM: CT HEAD WITHOUT CONTRAST TECHNIQUE: Contiguous axial images were obtained from the base of the skull through the vertex without intravenous contrast. COMPARISON:  None. FINDINGS: Brain: There is no acute intracranial hemorrhage or mass effect. Gray-white differentiation is preserved. Ventricles and sulci are within normal limits in size and configuration. Patchy hypoattenuation in the supratentorial white matter is nonspecific but probably reflects chronic microvascular ischemic changes. Extra-axial dural-based hyperdense lesion along the high left frontal convexity and falx measuring 2.6 x 1.8 x 2.3 cm is consistent with a meningioma. There is no apparent underlying edema. This abuts the superior sagittal sinus. Vascular: No hyperdense vessel. Mild intracranial atherosclerotic calcification at the skull base. Skull: Unremarkable. Sinuses/Orbits: No acute abnormality. Other: Mastoid air cells are clear. ASPECTS ([place] Stroke Program Early CT Score) - Ganglionic level infarction (caudate, lentiform nuclei, internal capsule, insula, M1-M3 cortex): 7 - Supraganglionic infarction (M4-M6 cortex): 3 Total score (0-10 with 10 being normal): 10 IMPRESSION: No acute intracranial hemorrhage or evidence of acute infarction. ASPECT score is 10. Chronic microvascular ischemic changes. Left frontal meningioma without significant mass effect or underlying edema. Initial results were communicated to Dr. Lorraine Lax at [place] 6/27/2021by text page via the [facility] messaging system. Electronically Signed   By: Macy Mis M.D.   On: 12/29/2019 18:20       Laurey Morale, MSN, NP-C [facility] [phone]  12/29/2019, 6:06 PM   Attending physician note to follow with Assessment and plan .   Assessment: 74 y.o. female PMH HTN, a. Fib ( not anticoagulated) presented to [facility] ED as a code stroke for c/o left side flaccid, right gaze. Brother was consented and patient was given TPA. 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 status post IV TPA and emergent mechanical thrombectomy  Plan:  -Admit to neuro ICU -- BP goal :  Neuro IR recommendations SBP 120-140  --MRI Brain  -- repeat head CT 24 hours after TPA  --Echocardiogram -- High intensity Statin if LDL > 70 -- HgbA1c, fasting lipid panel -- PT consult, OT consult, Speech consult --Telemetry monitoring --Frequent neuro  checks --Stroke swallow screen   History of atrial fibrillation -We will eventually need to be started on anticoagulation -Metoprolol as needed for heart rate >130  HTN: Monitor SBP 120-140 Labetalol/ cleviprex   GI: doc/senna Code status: FULL CODE DVT prophylaxis: SCD's only   --Please page the Stroke team from 8am-4pm.   You can look them up on [email or web address]    NEUROHOSPITALIST ADDENDUM Performed a face to face diagnostic evaluation at time of code stroke.   I have reviewed the contents of history and physical exam as documented by PA/ARNP/Resident and agree with above documentation.  I have discussed and formulated the above plan as documented. Edits to the note have been made as needed.  74 year old female with past medical history of atrial fibrillation not on anticoagulation, hypertension, sensorineural hearing loss following side effect of penicillin, who lives independently at home with her brother as her neighbor presents to the ED as a code  stroke for sudden onset left-sided weakness and right-sided gaze.  Less than normal 4:45 PM-patient's brother was with her and they were talking and she went to the kitchen and when he checked on her she was on the floor.  EMS was notified and patient was brought immediately to [facility] ED, blood pressure was 086 systolic.  On examination, this was limited as patient is deaf.  She was moving right upper and lower extremity spontaneously and with grip my hand on her right side.  Left upper extremity is plegic and left lower extremity was 1/ 5 strength.  She had forced gaze deviation to the right and reduced blink to threat on the left.  She was not speaking spontaneously.  Patient had recent ureteral stent placed 4 days ago for kidney stones-discussed with urologist and said this is not a contraindication for IV TPA.  CT head was negative for hemorrhage showed most likely left frontal meningioma.  After discussing with brother over the phone and obtaining consent IV TPA was administered.  CTA was obtained which showed a right M1 occlusion-patient was taken for emergent mechanical thrombectomy after explaining risk versus benefit to brother.  Dr. Patrecia Pour was also on the phone when we have obtained consent.  Regarding her baseline, she lives next to her brother but alone at home.  She is able to do most tasks, she cooks and uses the bathroom by herself.  Manages her finances with little assistance.  She does have some difficulty  ambulating due to arthritis and uses a walker to walk.    This patient is neurologically critically ill due to acute right MCA stroke he is at risk for significant risk of neurological worsening from cerebral edema,  death from brain herniation, heart failure, hemorrhagic conversion, infection, respiratory failure and seizure. This patient's care requires constant monitoring of vital signs, hemodynamics, respiratory and cardiac monitoring, review of multiple databases,  neurological assessment, discussion with family, other specialists and medical decision making of high complexity.  I spent  55 minutes minutes of neurocritical time in the care of this patient.       Karena Addison Crystale Giannattasio MD [facility] [phone]   If 7pm to 7am, please call on call as listed on AMION.

## 2019-12-29 NOTE — Consult Note (Cosign Needed)
NEURO HOSPITALIST  H&P   Chief Complaint: left side flaccid/ right gaze  History obtained from:  EMS / family HPI:                                                                                                                                         Gloria [name] is an 74 y.o. female PMH HTN, a. Fib ( not anticoagulated) presented to [facility] ED as a code stroke for c/o left side flaccid, right gaze.  Per EMS patient was at home with her brother and was at her baseline at 78 when he left to go to the store. When he came back she was lying on the ground with her left side flaccid. 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 Patient is not on any blood thinners. Over the past 6 weeks has had multiple admissions for bilateral ureter stents.   ED course:  CTH: did not reveal a hemorrhage.  Left frontal meningioma CTA: right M1 occlusion BP: 160/81 BG: 106  Date last known well: 12/29/19 Time last known well: 1645 tPA Given: Yes, started at 1630 Modified Rankin: Rankin Score=3 NIHSS:23     Past Medical History:  Diagnosis Date  . Arthritis    knees  . Atrial fibrillation ([place])   . Dysrhythmia   . History of kidney stones   . HOH (hard of hearing)   . Hypertension     Past Surgical History:  Procedure Laterality Date  . CHOLECYSTECTOMY    . CYSTOSCOPY WITH STENT PLACEMENT Bilateral 11/09/2019   Procedure: CYSTOSCOPY BILATERAL URETERAL STENT PLACEMENT;  Surgeon: Ardis Hughs, MD;  Location: [facility];  Service: Urology;  Laterality: Bilateral;  . CYSTOSCOPY/URETEROSCOPY/HOLMIUM LASER/STENT PLACEMENT Right 11/22/2019   Procedure: CYSTOSCOPY RIGHT URETEROSCOPY/HOLMIUM LASER STONE EXTRACTION /STENT EXCHANGE;  Surgeon: Ardis Hughs, MD;  Location: [facility];  Service: Urology;  Laterality: Right;  . CYSTOSCOPY/URETEROSCOPY/HOLMIUM LASER/STENT PLACEMENT Left 12/06/2019   Procedure: CYSTOSCOPY BILATERAL URETEROSCOPY/HOLMIUM LASER/STENT EXCHANGE;   Surgeon: Ardis Hughs, MD;  Location: [facility];  Service: Urology;  Laterality: Left;  . CYSTOSCOPY/URETEROSCOPY/HOLMIUM LASER/STENT PLACEMENT Bilateral 12/25/2019   Procedure: BILATERAL URETEROSCOPY/HOLMIUM LASER LEFT, BILATERAL STONE EXTRACTION BILATERAL STENT EXCHANGE;  Surgeon: Ardis Hughs, MD;  Location: [facility];  Service: Urology;  Laterality: Bilateral;    No family history on file.      Social History:  reports that she has never smoked. She has never used smokeless tobacco. She reports that she does not drink alcohol and does not use drugs.  Allergies:  Allergies  Allergen Reactions  . Aspartame And Phenylalanine Other (See Comments)    unknown  .  Azithromycin Other (See Comments)    Unknown  . Bee Venom Other (See Comments)    Hyper  . Cortizone-5 [Hydrocortisone] Itching and Swelling  . Olmesartan Other (See Comments)    unknown  . Other Other (See Comments)    IV dye Dyes - unknown reaction   . Penicillins Other (See Comments)    unknown  . Valsartan Other (See Comments)    Unknown  . Latex Rash and Other (See Comments)    Skin peels    Medications:                                                                                                                           Current Facility-Administered Medications  Medication Dose Route Frequency Provider Last Rate Last Admin  .  stroke: mapping our early stages of recovery book   Does not apply Once Vonzella Nipple, NP      . alteplase (ACTIVASE) 1 mg/mL infusion 86.1 mg  0.9 mg/kg Intravenous Once Aroor, Sushanth R, MD       Followed by  . 0.9 %  sodium chloride infusion  50 mL Intravenous Once Aroor, Sushanth R, MD      . 0.9 %  sodium chloride infusion   Intravenous Continuous Vonzella Nipple, NP      . acetaminophen (TYLENOL) tablet 650 mg  650 mg Oral Q4H PRN Vonzella Nipple, NP       Or  . acetaminophen (TYLENOL) 160 MG/5ML solution 650 mg  650 mg Per Tube Q4H PRN Vonzella Nipple,  NP       Or  . acetaminophen (TYLENOL) suppository 650 mg  650 mg Rectal Q4H PRN Vonzella Nipple, NP      . aspirin 81 MG chewable tablet           . labetalol (NORMODYNE) injection 20 mg  20 mg Intravenous Once Vonzella Nipple, NP       And  . clevidipine (CLEVIPREX) infusion 0.5 mg/mL  0-21 mg/hr Intravenous Continuous Vonzella Nipple, NP      . clopidogrel (PLAVIX) 300 MG tablet           . diphenhydrAMINE (BENADRYL) 50 MG/ML injection           . diphenhydrAMINE (BENADRYL) capsule 50 mg  50 mg Oral Once Aroor, Karena Addison R, MD       Or  . diphenhydrAMINE (BENADRYL) injection 50 mg  50 mg Intravenous Once Aroor, Karena Addison R, MD      . eptifibatide (INTEGRILIN) 20 MG/10ML injection           . iohexol (OMNIPAQUE) 240 MG/ML injection           . methylPREDNISolone sodium succinate (SOLU-MEDROL) 125 mg/2 mL injection 125 mg  125 mg Intravenous Once Aroor, Sushanth R, MD      . methylPREDNISolone sodium succinate (SOLU-MEDROL) 125 mg/2 mL injection           .  pantoprazole (PROTONIX) injection 40 mg  40 mg Intravenous QHS Vonzella Nipple, NP      . senna-docusate (Senokot-S) tablet 1 tablet  1 tablet Oral QHS PRN Vonzella Nipple, NP      . sodium chloride flush (NS) 0.9 % injection 3 mL  3 mL Intravenous Once Noemi Chapel, MD      . ticagrelor (BRILINTA) 90 MG tablet           . tirofiban (AGGRASTAT) 5-0.9 MG/100ML-% injection           . verapamil (ISOPTIN) 2.5 MG/ML injection            Current Outpatient Medications  Medication Sig Dispense Refill  . aspirin 325 MG tablet Take 325 mg by mouth daily.    . carvedilol (COREG) 6.25 MG tablet Take 6.25 mg by mouth 2 (two) times daily.    . furosemide (LASIX) 20 MG tablet Take 1 tablet (20 mg total) by mouth as needed for fluid. (Patient taking differently: Take 20 mg by mouth daily as needed for fluid. )  0  . mirabegron ER (MYRBETRIQ) 25 MG TB24 tablet Take 1 tablet (25 mg total) by mouth daily.    . traMADol (ULTRAM) 50  MG tablet Take 1-2 tablets (50-100 mg total) by mouth every 6 (six) hours as needed for moderate pain. 15 tablet 0   Facility-Administered Medications Ordered in Other Encounters  Medication Dose Route Frequency Provider Last Rate Last Admin  . lactated ringers infusion   Intravenous Continuous PRN Suzy Bouchard, CRNA   New Bag at 12/29/19 1839  . lidocaine 2% (20 mg/mL) 5 mL syringe   Intravenous Anesthesia Intra-op Suzy Bouchard, CRNA   60 mg at 12/29/19 1849  . propofol (DIPRIVAN) 10 mg/mL bolus/IV push   Intravenous Anesthesia Intra-op Suzy Bouchard, CRNA   120 mg at 12/29/19 1849  . succinylcholine (ANECTINE) syringe   Intravenous Anesthesia Intra-op Suzy Bouchard, CRNA   120 mg at 12/29/19 1849     ROS:                                                                                                                                       Unable to obtain d/t AMS   General Examination:                                                                                                      There were no vitals taken for this  visit.  Physical Exam  Constitutional: Appears well-developed and well-nourished.  Eyes: Normal external eye and conjunctiva. HENT: Normocephalic, no lesions, without obvious abnormality.   Musculoskeletal-no joint tenderness, deformity or swelling Cardiovascular: Normal rate and regular rhythm.  Respiratory: Effort normal, non-labored breathing saturations WNL GI: Soft.  No distension. There is no tenderness.  Skin: WDI  Neurological Examination Mental Status: Patient is deaf at baseline, attempted use of ASL interpreters for exam but patient did not/ would not open eyes.  Cranial Nerves: Right gaze deviation. Tongue appears midline Motor: Flaccid on the left. RUE able to raise anti gravity. RLE 2/5 Tone and bulk:normal tone throughout; no atrophy noted Sensory: neglecting left side Cerebellar: UTA Gait: deferred   Lab Results: Basic Metabolic  Panel: Recent Labs  Lab 12/23/19 0935  NA 143  K 4.8  CL 108  CO2 25  GLUCOSE 96  BUN 26*  CREATININE 1.49*  CALCIUM 9.0    CBC: Recent Labs  Lab 12/23/19 0935  WBC 3.9*  HGB 11.8*  HCT 38.1  MCV 104.7*  PLT 224   CBG: Recent Labs  Lab 12/29/19 1755  GLUCAP 106*    Imaging: CT HEAD CODE STROKE WO CONTRAST  Result Date: 12/29/2019 CLINICAL DATA:  Code stroke.  Left-sided weakness EXAM: CT HEAD WITHOUT CONTRAST TECHNIQUE: Contiguous axial images were obtained from the base of the skull through the vertex without intravenous contrast. COMPARISON:  None. FINDINGS: Brain: There is no acute intracranial hemorrhage or mass effect. Gray-white differentiation is preserved. Ventricles and sulci are within normal limits in size and configuration. Patchy hypoattenuation in the supratentorial white matter is nonspecific but probably reflects chronic microvascular ischemic changes. Extra-axial dural-based hyperdense lesion along the high left frontal convexity and falx measuring 2.6 x 1.8 x 2.3 cm is consistent with a meningioma. There is no apparent underlying edema. This abuts the superior sagittal sinus. Vascular: No hyperdense vessel. Mild intracranial atherosclerotic calcification at the skull base. Skull: Unremarkable. Sinuses/Orbits: No acute abnormality. Other: Mastoid air cells are clear. ASPECTS ([place] Stroke Program Early CT Score) - Ganglionic level infarction (caudate, lentiform nuclei, internal capsule, insula, M1-M3 cortex): 7 - Supraganglionic infarction (M4-M6 cortex): 3 Total score (0-10 with 10 being normal): 10 IMPRESSION: No acute intracranial hemorrhage or evidence of acute infarction. ASPECT score is 10. Chronic microvascular ischemic changes. Left frontal meningioma without significant mass effect or underlying edema. Initial results were communicated to Dr. Lorraine Lax at [place] 6/27/2021by text page via the [facility] messaging system. Electronically Signed   By: Macy Mis M.D.   On: 12/29/2019 18:20       Laurey Morale, MSN, NP-C [facility] [phone]  12/29/2019, 6:06 PM   Attending physician note to follow with Assessment and plan .   Assessment: 74 y.o. female PMH HTN, a. Fib ( not anticoagulated) presented to [facility] ED as a code stroke for c/o left side flaccid, right gaze. Brother was consented and patient was given TPA. CTA revealed a right M1 occlusion and patient taken to IR.   Stroke Risk Factors - atrial fibrillation   Plan:   CVA -- BP goal :  Neuro IR recommendations SBP 120-140  --MRI Brain  -- repeat head CT 24 hours after TPA  --Echocardiogram -- High intensity Statin if LDL > 70 -- HgbA1c, fasting lipid panel -- PT consult, OT consult, Speech consult --Telemetry monitoring --Frequent neuro checks --Stroke swallow screen   Disposition: TBD GI: doc/senna Code status: FULL CODE DVT prophylaxis: SCD's only  HTN: Monitor  SBP 120-140 Labetalol/ cleviprex  --Please page the Stroke team from 8am-4pm.   You can look them up on [email or web address]

## 2019-12-29 NOTE — Anesthesia Procedure Notes (Signed)
Arterial Line Insertion Start/End6/27/2021 7:00 PM, 12/29/2019 7:04 PM Performed by: Suzette Battiest, MD, anesthesiologist  Patient location: Pre-op. Preanesthetic checklist: patient identified, IV checked, site marked, risks and benefits discussed, surgical consent, monitors and equipment checked, pre-op evaluation, timeout performed and anesthesia consent Lidocaine 1% used for infiltration Left, radial was placed Catheter size: 20 Fr Hand hygiene performed  and maximum sterile barriers used   Attempts: 1 Procedure performed without using ultrasound guided technique. Following insertion, dressing applied and Biopatch. Post procedure assessment: normal and unchanged  Patient tolerated the procedure well with no immediate complications.

## 2019-12-29 NOTE — Anesthesia Preprocedure Evaluation (Signed)
Anesthesia Evaluation  Patient identified by MRN, date of birth, ID band Patient confused  Preop documentation limited or incomplete due to emergent nature of procedure.  Airway Mallampati: II  TM Distance: >3 FB     Dental  (+) Dental Advisory Given   Pulmonary neg pulmonary ROS,    breath sounds clear to auscultation       Cardiovascular hypertension, Pt. on medications and Pt. on home beta blockers + dysrhythmias Atrial Fibrillation  Rhythm:Regular Rate:Normal     Neuro/Psych CVA    GI/Hepatic   Endo/Other    Renal/GU Renal InsufficiencyRenal disease     Musculoskeletal   Abdominal   Peds  Hematology  (+) anemia ,   Anesthesia Other Findings   Reproductive/Obstetrics                             Anesthesia Physical Anesthesia Plan  ASA: IV and emergent  Anesthesia Plan: General   Post-op Pain Management:    Induction: Intravenous and Rapid sequence  PONV Risk Score and Plan: 3 and Dexamethasone, Ondansetron and Treatment may vary due to age or medical condition  Airway Management Planned: Oral ETT and Video Laryngoscope Planned  Additional Equipment: Arterial line  Intra-op Plan:   Post-operative Plan: Possible Post-op intubation/ventilation  Informed Consent: I have reviewed the patients History and Physical, chart, labs and discussed the procedure including the risks, benefits and alternatives for the proposed anesthesia with the patient or authorized representative who has indicated his/her understanding and acceptance.     Dental advisory given  Plan Discussed with:   Anesthesia Plan Comments:         Anesthesia Quick Evaluation

## 2019-12-29 NOTE — Progress Notes (Signed)
Pharmacist Code Stroke Response  Notified to mix tPA at 1819 by Dr. Lorraine Lax Delivered tPA to RN at 1823 tPA dose delivered at 1830  tPA dose = 8.6mg  bolus over 1 minute followed by 77.5mg  for a total dose of 86.1mg  over 1 hour  Issues/delays encountered (if applicable): IV contrast allergy. Additionally, patient was deaf   Albertina Parr, PharmD., BCPS, BCCCP Clinical Pharmacist Clinical phone for 12/29/19 until 9:30pm: [phone] If after 9:30pm, please refer to [facility] for unit-specific pharmacist

## 2019-12-30 ENCOUNTER — Inpatient Hospital Stay (HOSPITAL_COMMUNITY): Payer: PPO

## 2019-12-30 ENCOUNTER — Other Ambulatory Visit: Payer: Self-pay | Admitting: Interventional Radiology

## 2019-12-30 ENCOUNTER — Encounter (HOSPITAL_COMMUNITY): Payer: Self-pay | Admitting: Radiology

## 2019-12-30 DIAGNOSIS — I6601 Occlusion and stenosis of right middle cerebral artery: Secondary | ICD-10-CM | POA: Diagnosis present

## 2019-12-30 DIAGNOSIS — I724 Aneurysm of artery of lower extremity: Secondary | ICD-10-CM

## 2019-12-30 DIAGNOSIS — I351 Nonrheumatic aortic (valve) insufficiency: Secondary | ICD-10-CM

## 2019-12-30 DIAGNOSIS — I361 Nonrheumatic tricuspid (valve) insufficiency: Secondary | ICD-10-CM

## 2019-12-30 HISTORY — PX: IR CT HEAD LTD: IMG2386

## 2019-12-30 HISTORY — PX: IR INTRA CRAN STENT: IMG2345

## 2019-12-30 HISTORY — PX: IR PERCUTANEOUS ART THROMBECTOMY/INFUSION INTRACRANIAL INC DIAG ANGIO: IMG6087

## 2019-12-30 LAB — CBC WITH DIFFERENTIAL/PLATELET
Abs Immature Granulocytes: 0.03 10*3/uL (ref 0.00–0.07)
Basophils Absolute: 0 10*3/uL (ref 0.0–0.1)
Basophils Relative: 0 %
Eosinophils Absolute: 0 10*3/uL (ref 0.0–0.5)
Eosinophils Relative: 0 %
HCT: 26.6 % — ABNORMAL LOW (ref 36.0–46.0)
Hemoglobin: 8.4 g/dL — ABNORMAL LOW (ref 12.0–15.0)
Immature Granulocytes: 0 %
Lymphocytes Relative: 5 %
Lymphs Abs: 0.4 10*3/uL — ABNORMAL LOW (ref 0.7–4.0)
MCH: 32.6 pg (ref 26.0–34.0)
MCHC: 31.6 g/dL (ref 30.0–36.0)
MCV: 103.1 fL — ABNORMAL HIGH (ref 80.0–100.0)
Monocytes Absolute: 0.1 10*3/uL (ref 0.1–1.0)
Monocytes Relative: 2 %
Neutro Abs: 7.3 10*3/uL (ref 1.7–7.7)
Neutrophils Relative %: 93 %
Platelets: 200 10*3/uL (ref 150–400)
RBC: 2.58 MIL/uL — ABNORMAL LOW (ref 3.87–5.11)
RDW: 13.5 % (ref 11.5–15.5)
WBC: 7.8 10*3/uL (ref 4.0–10.5)
nRBC: 0 % (ref 0.0–0.2)

## 2019-12-30 LAB — LIPID PANEL
Cholesterol: 143 mg/dL (ref 0–200)
HDL: 41 mg/dL (ref >40)
LDL Cholesterol: 90 mg/dL (ref 0–99)
Total CHOL/HDL Ratio: 3.5 RATIO
Triglycerides: 61 mg/dL (ref <150)
VLDL: 12 mg/dL (ref 0–40)

## 2019-12-30 LAB — BASIC METABOLIC PANEL
Anion gap: 6 (ref 5–15)
BUN: 28 mg/dL — ABNORMAL HIGH (ref 8–23)
CO2: 22 mmol/L (ref 22–32)
Calcium: 7.9 mg/dL — ABNORMAL LOW (ref 8.9–10.3)
Chloride: 113 mmol/L — ABNORMAL HIGH (ref 98–111)
Creatinine, Ser: 1.44 mg/dL — ABNORMAL HIGH (ref 0.44–1.00)
GFR calc Af Amer: 41 mL/min — ABNORMAL LOW (ref >60)
GFR calc non Af Amer: 36 mL/min — ABNORMAL LOW (ref >60)
Glucose, Bld: 149 mg/dL — ABNORMAL HIGH (ref 70–99)
Potassium: 4.5 mmol/L (ref 3.5–5.1)
Sodium: 141 mmol/L (ref 135–145)

## 2019-12-30 LAB — ABO/RH: ABO/RH(D): A POS

## 2019-12-30 LAB — POCT I-STAT 7, (LYTES, BLD GAS, ICA,H+H)
Acid-base deficit: 3 mmol/L — ABNORMAL HIGH (ref 0.0–2.0)
Bicarbonate: 22.3 mmol/L (ref 20.0–28.0)
Calcium, Ion: 1.15 mmol/L (ref 1.15–1.40)
HCT: 26 % — ABNORMAL LOW (ref 36.0–46.0)
Hemoglobin: 8.8 g/dL — ABNORMAL LOW (ref 12.0–15.0)
O2 Saturation: 100 %
Patient temperature: 97.4
Potassium: 4.3 mmol/L (ref 3.5–5.1)
Sodium: 141 mmol/L (ref 135–145)
TCO2: 24 mmol/L (ref 22–32)
pCO2 arterial: 39.7 mmHg (ref 32.0–48.0)
pH, Arterial: 7.354 (ref 7.350–7.450)
pO2, Arterial: 242 mmHg — ABNORMAL HIGH (ref 83.0–108.0)

## 2019-12-30 LAB — HEMOGLOBIN A1C
Hgb A1c MFr Bld: 5.2 % (ref 4.8–5.6)
Mean Plasma Glucose: 102.54 mg/dL

## 2019-12-30 LAB — TRIGLYCERIDES: Triglycerides: 64 mg/dL (ref <150)

## 2019-12-30 LAB — ECHOCARDIOGRAM LIMITED: Weight: 3375.68 oz

## 2019-12-30 LAB — MRSA PCR SCREENING: MRSA by PCR: NEGATIVE

## 2019-12-30 LAB — GLUCOSE, CAPILLARY: Glucose-Capillary: 101 mg/dL — ABNORMAL HIGH (ref 70–99)

## 2019-12-30 MED ORDER — FENTANYL CITRATE (PF) 100 MCG/2ML IJ SOLN
25.0000 ug | INTRAMUSCULAR | Status: DC | PRN
  Administered 2019-12-30: 50 ug via INTRAVENOUS
  Administered 2020-01-01: 100 ug via INTRAVENOUS
  Filled 2019-12-30 (×2): qty 2

## 2019-12-30 MED ORDER — ASPIRIN 81 MG PO CHEW
81.0000 mg | CHEWABLE_TABLET | Freq: Every day | ORAL | Status: DC
  Administered 2019-12-30 – 2020-01-09 (×11): 81 mg
  Filled 2019-12-30 (×11): qty 1

## 2019-12-30 MED ORDER — PROPOFOL 1000 MG/100ML IV EMUL
0.0000 ug/kg/min | INTRAVENOUS | Status: DC
  Administered 2019-12-30: 50 ug/kg/min via INTRAVENOUS
  Administered 2019-12-30: 35 ug/kg/min via INTRAVENOUS
  Filled 2019-12-30 (×2): qty 100

## 2019-12-30 MED ORDER — SODIUM CHLORIDE 0.9 % IV SOLN
250.0000 mL | INTRAVENOUS | Status: DC
  Administered 2020-01-01: 250 mL via INTRAVENOUS

## 2019-12-30 MED ORDER — POLYETHYLENE GLYCOL 3350 17 G PO PACK: 17.0000 g | PACK | Freq: Every day | ORAL | Status: DC

## 2019-12-30 MED ORDER — CLEVIDIPINE BUTYRATE 0.5 MG/ML IV EMUL: 0.0000 mg/h | INTRAVENOUS | Status: DC

## 2019-12-30 MED ORDER — EPHEDRINE SULFATE 50 MG/ML IJ SOLN
INTRAMUSCULAR | Status: DC | PRN
  Administered 2019-12-29: 10 mg via INTRAVENOUS

## 2019-12-30 MED ORDER — FENTANYL CITRATE (PF) 100 MCG/2ML IJ SOLN: 25.0000 ug | INTRAMUSCULAR | Status: DC | PRN

## 2019-12-30 MED ORDER — ACETAMINOPHEN 650 MG RE SUPP: 650.0000 mg | RECTAL | Status: DC | PRN

## 2019-12-30 MED ORDER — DOCUSATE SODIUM 50 MG/5ML PO LIQD
100.0000 mg | Freq: Two times a day (BID) | ORAL | Status: DC
  Filled 2019-12-30: qty 10

## 2019-12-30 MED ORDER — ORAL CARE MOUTH RINSE
15.0000 mL | OROMUCOSAL | Status: DC
  Administered 2019-12-30 – 2019-12-31 (×14): 15 mL via OROMUCOSAL

## 2019-12-30 MED ORDER — ALBUMIN HUMAN 5 % IV SOLN: INTRAVENOUS | Status: DC | PRN

## 2019-12-30 MED ORDER — SODIUM CHLORIDE 0.9 % IV SOLN: INTRAVENOUS | Status: DC

## 2019-12-30 MED ORDER — ATORVASTATIN CALCIUM 40 MG PO TABS
40.0000 mg | ORAL_TABLET | Freq: Every day | ORAL | Status: DC
  Administered 2019-12-31 – 2020-01-05 (×6): 40 mg
  Filled 2019-12-30 (×6): qty 1

## 2019-12-30 MED ORDER — DOCUSATE SODIUM 50 MG/5ML PO LIQD
100.0000 mg | Freq: Two times a day (BID) | ORAL | Status: DC
  Administered 2019-12-30 – 2020-01-01 (×3): 100 mg
  Filled 2019-12-30 (×2): qty 10

## 2019-12-30 MED ORDER — ACETAMINOPHEN 325 MG PO TABS: 650.0000 mg | ORAL_TABLET | ORAL | Status: DC | PRN

## 2019-12-30 MED ORDER — PHENYLEPHRINE HCL-NACL 10-0.9 MG/250ML-% IV SOLN
25.0000 ug/min | INTRAVENOUS | Status: DC
  Administered 2019-12-30: 25 ug/min via INTRAVENOUS
  Administered 2019-12-30 – 2019-12-31 (×3): 50 ug/min via INTRAVENOUS
  Administered 2019-12-31: 55 ug/min via INTRAVENOUS
  Filled 2019-12-30 (×4): qty 250

## 2019-12-30 MED ORDER — TICAGRELOR 90 MG PO TABS
90.0000 mg | ORAL_TABLET | Freq: Two times a day (BID) | ORAL | Status: DC
  Filled 2019-12-30 (×2): qty 1

## 2019-12-30 MED ORDER — CHLORHEXIDINE GLUCONATE 0.12% ORAL RINSE (MEDLINE KIT)
15.0000 mL | Freq: Two times a day (BID) | OROMUCOSAL | Status: DC
  Administered 2019-12-30 – 2019-12-31 (×4): 15 mL via OROMUCOSAL

## 2019-12-30 MED ORDER — ASPIRIN 81 MG PO CHEW: 81.0000 mg | CHEWABLE_TABLET | Freq: Every day | ORAL | Status: DC

## 2019-12-30 MED ORDER — TICAGRELOR 90 MG PO TABS
90.0000 mg | ORAL_TABLET | Freq: Two times a day (BID) | ORAL | Status: DC
  Administered 2019-12-30 – 2020-01-09 (×20): 90 mg
  Filled 2019-12-30 (×20): qty 1

## 2019-12-30 MED ORDER — PROPOFOL 500 MG/50ML IV EMUL
INTRAVENOUS | Status: DC | PRN
  Administered 2019-12-29: 50 ug/kg/min via INTRAVENOUS

## 2019-12-30 MED ORDER — ACETAMINOPHEN 160 MG/5ML PO SOLN: 650.0000 mg | ORAL | Status: DC | PRN

## 2019-12-30 NOTE — CV Procedure (Signed)
INR   CT brain No ICH or mass effect ?contrast stain in the perisylvian region. CT Abdo / Pelvis  NO retroperitoneal bleed. Hematoma in the Rt inguinal region extending into the pannus of  the RT lower quadrant. VS stable on vasopressors unchanged. Distal pulses dopplerable. S.Mithcell Schumpert MD .

## 2019-12-30 NOTE — Progress Notes (Addendum)
Referring Physician(s): Code Stroke- Aroor, Sushanth R  Supervising Physician: Luanne Bras  Patient Status:  [facility] - In-pt  Chief Complaint: None- intubated with sedation  Subjective:  History of acute CVA s/p cerebral arteriogram with emergent mechanical thrombectomy of right MCA M1 occlusion achieving a TICI 2b revascularization, along with revascularization of right MCA stenosis using rescue stent placement via right femoral approach 12/29/2019 by Dr. Estanislado Pandy. Patient laying in bed intubated with sedation. Can spontaneously move right side, left side withdraws from pain. Right groin puncture site c/d/i- was concern for hematoma; CT revealed no extension into retroperitoneal space.   Allergies: Aspartame and phenylalanine, Azithromycin, Bee venom, Cortizone-5 [hydrocortisone], Olmesartan, Other, Penicillins, Valsartan, and Latex  Medications: Prior to Admission medications   Medication Sig Start Date End Date Taking? Authorizing Provider  aspirin 325 MG tablet Take 325 mg by mouth daily.   Yes [provider]  carvedilol (COREG) 6.25 MG tablet Take 6.25 mg by mouth 2 (two) times daily. 11/08/19  Yes [provider]  furosemide (LASIX) 20 MG tablet Take 1 tablet (20 mg total) by mouth as needed for fluid. Patient taking differently: Take 20 mg by mouth daily as needed for fluid.  11/15/19  Yes Pokhrel, Laxman, MD  magnesium oxide (MAG-OX) 400 MG tablet Take 400 mg by mouth 2 (two) times daily. 11/12/19  Yes [provider]  traMADol (ULTRAM) 50 MG tablet Take 1-2 tablets (50-100 mg total) by mouth every 6 (six) hours as needed for moderate pain. 12/06/19  Yes Ardis Hughs, MD  mirabegron ER (MYRBETRIQ) 25 MG TB24 tablet Take 1 tablet (25 mg total) by mouth daily. 11/22/19   Ardis Hughs, MD     Vital Signs: BP (!) 124/112 (BP Location: Right Arm)   Pulse (!) 51   Temp 98.1 F (36.7 C) (Axillary)   Resp 16   Wt 210 lb 15.7 oz (95.7  kg)   SpO2 100%   BMI 38.59 kg/m   Physical Exam Vitals and nursing note reviewed.  Constitutional:      General: She is not in acute distress.    Comments: Intubated with sedation.  Pulmonary:     Effort: Pulmonary effort is normal. No respiratory distress.     Comments: Intubated with sedation. Skin:    General: Skin is warm and dry.     Comments: Right groin puncture site soft without active bleeding or hematoma.  Neurological:     Comments: Intubated with sedation. She does not open eyes to voice/painful stimuli. PERRL bilaterally. Can spontaneously move right side, left side withdraws from pain. Distal pulses (DPs) palpable bilaterally with Doppler.     Imaging: CT ABDOMEN PELVIS WO CONTRAST  Result Date: 12/29/2019 CLINICAL DATA:  Right inguinal hematoma, recent neuro interventional procedure, assess for retroperitoneal hematoma EXAM: CT ABDOMEN AND PELVIS WITHOUT CONTRAST TECHNIQUE: Multidetector CT imaging of the abdomen and pelvis was performed following the standard protocol without IV contrast. COMPARISON:  11/08/2019 FINDINGS: Lower chest: Hypoventilatory changes are seen within the dependent lower lobes. Heart is enlarged with trace pericardial fluid unchanged. Hepatobiliary: Gallbladder surgically absent. Unenhanced imaging the liver demonstrates no gross abnormalities. Pancreas: Unremarkable. No pancreatic ductal dilatation or surrounding inflammatory changes. Spleen: Normal in size without focal abnormality. Adrenals/Urinary Tract: Excreted contrast is seen within the bilateral kidneys related to previous neuro interventional procedure. Bilateral renal cortical thinning is noted. Stable bilateral adrenal adenoma. Bilateral ureteral stents extend from the renal pelves into the bladder lumen. No filling defects within  the bladder. Stomach/Bowel: Enteric catheter extends into the gastric lumen. No bowel obstruction or ileus. No bowel wall thickening or inflammatory change.  Vascular/Lymphatic: Aortic atherosclerosis. No enlarged abdominal or pelvic lymph nodes. Reproductive: Uterus and bilateral adnexa are unremarkable. Other: Hematoma is seen within the right inguinal region extending along the inguinal crease into the pannus of the right lower quadrant abdominal wall. This measures up to 3.7 cm in thickness. There is no evidence of extension into the retroperitoneal space. At the time of the exam, extrinsic compression is being held at the right groin. There is no free fluid or free gas within the peritoneal cavity. Fat containing hernia again noted within the left lower quadrant abdominal wall. No bowel herniation. Musculoskeletal: No acute or destructive bony lesions. Reconstructed images demonstrate no additional findings. IMPRESSION: 1. Hematoma within the right inguinal region extending along the inguinal crease into the pannus of the right lower quadrant abdominal wall. No extension into the retroperitoneal space. 2. Bilateral ureteral stents as above. 3. Stable bilateral adrenal adenoma. 4. Aortic Atherosclerosis (ICD10-I70.0). Electronically Signed   By: Randa Ngo M.D.   On: 12/29/2019 23:48   CT Code Stroke CTA Head W/WO contrast  Result Date: 12/29/2019 CLINICAL DATA:  Left-sided weakness, code stroke follow-up EXAM: CT ANGIOGRAPHY HEAD AND NECK TECHNIQUE: Multidetector CT imaging of the head and neck was performed using the standard protocol during bolus administration of intravenous contrast. Multiplanar CT image reconstructions and MIPs were obtained to evaluate the vascular anatomy. Carotid stenosis measurements (when applicable) are obtained utilizing NASCET criteria, using the distal internal carotid diameter as the denominator. CONTRAST:  63mL OMNIPAQUE IOHEXOL 350 MG/ML SOLN COMPARISON:  None. FINDINGS: CTA NECK Aortic arch: Minimal calcified plaque along the aortic arch. Mild calcified plaque at the great vessel origins, which are patent. Right carotid  system: Patent. Common carotid has a retropharyngeal course. Mild calcified plaque at the ICA origin causing minimal stenosis. Left carotid system: Patent. Common carotid has a retropharyngeal course. Mild calcified plaque at the ICA origin causing minimal stenosis. Vertebral arteries: Patent. Left vertebral artery is dominant. Suspected stenosis of the proximal right vertebral artery. Skeleton: Degenerative changes of the cervical spine. Other neck: No mass or adenopathy. Upper chest: Enlargement of the main pulmonary artery suggesting pulmonary arterial hypertension. Review of the MIP images confirms the above findings CTA HEAD Anterior circulation: Intracranial internal carotid arteries are patent with calcified plaque causing mild stenosis. Anterior cerebral arteries are patent. Left A1 ACA is dominant with diminutive or absent right A1 segment. There is occlusion of the mid right M1 MCA. There is preserved opacification of the more distal right MCA territory. Left MCA is patent. Posterior circulation: Intracranial vertebral arteries are patent. Basilar artery is patent. Posterior cerebral arteries are patent. There are posterior communicating arteries present bilaterally with fetal origin of the right PCA. Venous sinuses: Patent as allowed by contrast bolus timing. Review of the MIP images confirms the above findings IMPRESSION: Occlusion of the right M1 MCA. However, there is good filling of the more distal MCA territory. No hemodynamically significant stenosis in the neck. These results were communicated to Dr. Lorraine Lax at [place] 6/27/2021by text page via the [facility] messaging system. Electronically Signed   By: Macy Mis M.D.   On: 12/29/2019 19:10   CT HEAD WO CONTRAST  Result Date: 12/29/2019 CLINICAL DATA:  Stroke follow-up.  Status post thrombectomy EXAM: CT HEAD WITHOUT CONTRAST TECHNIQUE: Contiguous axial images were obtained from the base of the skull through the  vertex without intravenous  contrast. COMPARISON:  Head CT 12/29/2019 FINDINGS: Brain: There is hyperdense material in the right subarachnoid space, overlying the frontal operculum and within the sylvian fissure. No other extra-axial collection. No midline shift or other mass effect. No intraparenchymal hemorrhage. 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 Vascular: Status post right MCA stent placement. Skull: Normal. Negative for fracture or focal lesion. Sinuses/Orbits: No acute finding. Other: None. IMPRESSION: 1. Hyperdense material in the right subarachnoid space, overlying the frontal operculum and within the Sylvian fissure. This is most likely contrast staining, but follow-up studies will be necessary to differentiate from subarachnoid hemorrhage. 2. Unchanged left parafalcine meningioma. Electronically Signed   By: Ulyses Jarred M.D.   On: 12/29/2019 23:49   CT Code Stroke CTA Neck W/WO contrast  Result Date: 12/29/2019 CLINICAL DATA:  Left-sided weakness, code stroke follow-up EXAM: CT ANGIOGRAPHY HEAD AND NECK TECHNIQUE: Multidetector CT imaging of the head and neck was performed using the standard protocol during bolus administration of intravenous contrast. Multiplanar CT image reconstructions and MIPs were obtained to evaluate the vascular anatomy. Carotid stenosis measurements (when applicable) are obtained utilizing NASCET criteria, using the distal internal carotid diameter as the denominator. CONTRAST:  90mL OMNIPAQUE IOHEXOL 350 MG/ML SOLN COMPARISON:  None. FINDINGS: CTA NECK Aortic arch: Minimal calcified plaque along the aortic arch. Mild calcified plaque at the great vessel origins, which are patent. Right carotid system: Patent. Common carotid has a retropharyngeal course. Mild calcified plaque at the ICA origin causing minimal stenosis. Left carotid system: Patent. Common carotid has a retropharyngeal course. Mild calcified plaque at the ICA origin causing minimal stenosis. Vertebral arteries: Patent. Left  vertebral artery is dominant. Suspected stenosis of the proximal right vertebral artery. Skeleton: Degenerative changes of the cervical spine. Other neck: No mass or adenopathy. Upper chest: Enlargement of the main pulmonary artery suggesting pulmonary arterial hypertension. Review of the MIP images confirms the above findings CTA HEAD Anterior circulation: Intracranial internal carotid arteries are patent with calcified plaque causing mild stenosis. Anterior cerebral arteries are patent. Left A1 ACA is dominant with diminutive or absent right A1 segment. There is occlusion of the mid right M1 MCA. There is preserved opacification of the more distal right MCA territory. Left MCA is patent. Posterior circulation: Intracranial vertebral arteries are patent. Basilar artery is patent. Posterior cerebral arteries are patent. There are posterior communicating arteries present bilaterally with fetal origin of the right PCA. Venous sinuses: Patent as allowed by contrast bolus timing. Review of the MIP images confirms the above findings IMPRESSION: Occlusion of the right M1 MCA. However, there is good filling of the more distal MCA territory. No hemodynamically significant stenosis in the neck. These results were communicated to Dr. Lorraine Lax at [place] 6/27/2021by text page via the [facility] messaging system. Electronically Signed   By: Macy Mis M.D.   On: 12/29/2019 19:10   DG Chest Port 1 View  Result Date: 12/30/2019 CLINICAL DATA:  Intubation. EXAM: PORTABLE CHEST 1 VIEW COMPARISON:  Radiograph 11/08/2019. FINDINGS: Low positioning of the endotracheal tube 6 mm from the carina. Enteric tube in place with tip below the diaphragm not included in this chest field of view. Stable cardiomegaly. Unchanged mediastinal contours. Interstitial coarsening which appears chronic. No focal airspace disease, pleural effusion, or pneumothorax. No acute osseous abnormalities are seen. IMPRESSION: 1. Low positioning of the  endotracheal tube 6 mm from the carina. Retraction of 2-3 cm recommended. 2. Enteric tube in place with tip below the diaphragm. 3. Stable cardiomegaly  and chronic interstitial coarsening. Electronically Signed   By: Keith Rake M.D.   On: 12/30/2019 01:11   DG Abd Portable 1V  Result Date: 12/30/2019 CLINICAL DATA:  OG tube placement. EXAM: PORTABLE ABDOMEN - 1 VIEW COMPARISON:  Abdomen pelvis CT yesterday. FINDINGS: Tip and side port of the enteric tube below the diaphragm in the stomach. Excreted IV contrast within both renal collecting systems and urinary bladder. Bilateral ureteral stents in place. Nephrograms demonstrate mild hydronephrosis. Nonobstructive bowel gas pattern. IMPRESSION: 1. Tip and side port of the enteric tube below the diaphragm in the stomach. 2. Bilateral ureteral stents in place. Excreted IV contrast in both renal collecting systems in the urinary bladder with mild bilateral hydronephrosis. Electronically Signed   By: Keith Rake M.D.   On: 12/30/2019 01:13   CT HEAD CODE STROKE WO CONTRAST  Result Date: 12/29/2019 CLINICAL DATA:  Code stroke.  Left-sided weakness EXAM: CT HEAD WITHOUT CONTRAST TECHNIQUE: Contiguous axial images were obtained from the base of the skull through the vertex without intravenous contrast. COMPARISON:  None. FINDINGS: Brain: There is no acute intracranial hemorrhage or mass effect. Gray-white differentiation is preserved. Ventricles and sulci are within normal limits in size and configuration. Patchy hypoattenuation in the supratentorial white matter is nonspecific but probably reflects chronic microvascular ischemic changes. Extra-axial dural-based hyperdense lesion along the high left frontal convexity and falx measuring 2.6 x 1.8 x 2.3 cm is consistent with a meningioma. There is no apparent underlying edema. This abuts the superior sagittal sinus. Vascular: No hyperdense vessel. Mild intracranial atherosclerotic calcification at the skull  base. Skull: Unremarkable. Sinuses/Orbits: No acute abnormality. Other: Mastoid air cells are clear. ASPECTS ([place] Stroke Program Early CT Score) - Ganglionic level infarction (caudate, lentiform nuclei, internal capsule, insula, M1-M3 cortex): 7 - Supraganglionic infarction (M4-M6 cortex): 3 Total score (0-10 with 10 being normal): 10 IMPRESSION: No acute intracranial hemorrhage or evidence of acute infarction. ASPECT score is 10. Chronic microvascular ischemic changes. Left frontal meningioma without significant mass effect or underlying edema. Initial results were communicated to Dr. Lorraine Lax at [place] 6/27/2021by text page via the [facility] messaging system. Electronically Signed   By: Macy Mis M.D.   On: 12/29/2019 18:20    Labs:  CBC: Recent Labs    12/03/19 1331 12/03/19 1331 12/23/19 0935 12/23/19 0935 12/29/19 1802 12/29/19 1802 12/29/19 1846 12/29/19 2300 12/30/19 0134 12/30/19 0500  WBC 3.2*  --  3.9*  --  5.8  --   --   --   --  7.8  HGB 12.3   < > 11.8*   < > 11.8*   < > 11.6* 9.2* 8.8* 8.4*  HCT 39.6   < > 38.1   < > 38.3   < > 34.0* 27.0* 26.0* 26.6*  PLT 208  --  224  --  225  --   --   --   --  200   < > = values in this interval not displayed.    COAGS: Recent Labs    12/29/19 1802  INR 1.1  APTT 25    BMP: Recent Labs    11/19/19 0930 11/19/19 0930 12/23/19 0935 12/23/19 0935 12/29/19 1802 12/29/19 1802 12/29/19 1846 12/29/19 2300 12/30/19 0134 12/30/19 0500  NA 143   < > 143   < > 141   < > 140 139 141 141  K 3.8   < > 4.8   < > 5.3*   < > 5.2* 4.4 4.3 4.5  CL 107   < > 108   < > 106  --  106 108  --  113*  CO2 27  --  25  --  24  --   --   --   --  22  GLUCOSE 89   < > 96   < > 109*  --  103* 134*  --  149*  BUN 24*   < > 26*   < > 32*  --  34* 26*  --  28*  CALCIUM 9.1  --  9.0  --  8.9  --   --   --   --  7.9*  CREATININE 1.40*   < > 1.49*   < > 1.76*  --  1.90* 1.50*  --  1.44*  GFRNONAA 37*  --  34*  --  28*  --   --   --   --  36*    GFRAA 43*  --  40*  --  32*  --   --   --   --  41*   < > = values in this interval not displayed.    LIVER FUNCTION TESTS: Recent Labs    11/08/19 1722 11/08/19 1722 11/09/19 0431 11/10/19 0455 11/11/19 0442 12/29/19 1802  BILITOT 0.6  --   --  0.3 0.3 0.6  AST 14*  --   --  13* 13* 17  ALT 14  --   --  13 14 11   ALKPHOS 66  --   --  57 54 82  PROT 6.9  --   --  6.1* 6.2* 6.2*  ALBUMIN 3.3*   < > 3.0* 2.7* 2.8* 3.2*   < > = values in this interval not displayed.    Assessment and Plan:  History of acute CVA s/p cerebral arteriogram with emergent mechanical thrombectomy of right MCA M1 occlusion achieving a TICI 2b revascularization, along with revascularization of right MCA stenosis using rescue stent placement via right femoral approach 12/29/2019 by Dr. Estanislado Pandy. Patient's condition stable- remains intubated/sedated, can spontaneously move right side, left side withdraws from pain. Right groin puncture site stable, distal pulses (DPs) palpable bilaterally with Doppler. Continue taking Brilinta 90 mg twice daily and Aspirin 81 mg once daily. Further plans per neurology/CCM- appreciate and agree with management. NIR to follow.   Electronically Signed: Earley Abide, PA-C 12/30/2019, 9:34 AM   I spent a total of 25 Minutes at the the patient's bedside AND on the patient's hospital floor or unit, greater than 50% of which was counseling/coordinating care for CVA s/p revascularization.

## 2019-12-30 NOTE — Progress Notes (Signed)
ET tube retracted 2cm as per x-ray report. Tube moved from 24cm to 22cm at the lip.

## 2019-12-30 NOTE — Consult Note (Signed)
Urology Consult   Physician requesting consult: Rosalin Hawking  Reason for consult: Hematuria  History of Present Illness: Gloria [name] is a 74 y.o. female with PMH significant for nephrolithiasis, afib, and HTN who was admitted yesterday for treatment of CVA.  She has been maintained on Lovenox, ASA, and Brilinta. She was noted to have hematuria today and urology consult was obtained.  She does not have a foley in place.  WBC 7.8 Cr 1.4.  CT A/P was performed on 12/29/19 due to groin hematoma after procedure.  This revealed both ureteral stents to be in good position.   Pt has a hx of nephrolithiasis which has required multiple procedures by Dr. Louis Meckel.  On 12/25/19 she had a cysto with bilateral ureteroscopy stone extraction, left laser lithotripsy, and bilateral ureteral stent exchange (with tethers placed in the vagina).    She is currently intubated.  Past Medical History:  Diagnosis Date   Arthritis    knees   Atrial fibrillation ([place])    Dysrhythmia    History of kidney stones    HOH (hard of hearing)    Hypertension     Past Surgical History:  Procedure Laterality Date   CHOLECYSTECTOMY     CYSTOSCOPY WITH STENT PLACEMENT Bilateral 11/09/2019   Procedure: CYSTOSCOPY BILATERAL URETERAL STENT PLACEMENT;  Surgeon: Ardis Hughs, MD;  Location: [facility];  Service: Urology;  Laterality: Bilateral;   CYSTOSCOPY/URETEROSCOPY/HOLMIUM LASER/STENT PLACEMENT Right 11/22/2019   Procedure: CYSTOSCOPY RIGHT URETEROSCOPY/HOLMIUM LASER STONE EXTRACTION /STENT EXCHANGE;  Surgeon: Ardis Hughs, MD;  Location: [facility];  Service: Urology;  Laterality: Right;   CYSTOSCOPY/URETEROSCOPY/HOLMIUM LASER/STENT PLACEMENT Left 12/06/2019   Procedure: CYSTOSCOPY BILATERAL URETEROSCOPY/HOLMIUM LASER/STENT EXCHANGE;  Surgeon: Ardis Hughs, MD;  Location: [facility];  Service: Urology;  Laterality: Left;   CYSTOSCOPY/URETEROSCOPY/HOLMIUM LASER/STENT PLACEMENT Bilateral 12/25/2019    Procedure: BILATERAL URETEROSCOPY/HOLMIUM LASER LEFT, BILATERAL STONE EXTRACTION BILATERAL STENT EXCHANGE;  Surgeon: Ardis Hughs, MD;  Location: [facility];  Service: Urology;  Laterality: Bilateral;   RADIOLOGY WITH ANESTHESIA N/A 12/29/2019   Procedure: IR WITH ANESTHESIA;  Surgeon: Radiologist, Medication, MD;  Location: [place];  Service: Radiology;  Laterality: N/A;     Current Hospital Medications:  Home meds:  Current Meds  Medication Sig   aspirin 325 MG tablet Take 325 mg by mouth daily.   carvedilol (COREG) 6.25 MG tablet Take 6.25 mg by mouth 2 (two) times daily.   furosemide (LASIX) 20 MG tablet Take 1 tablet (20 mg total) by mouth as needed for fluid. (Patient taking differently: Take 20 mg by mouth daily as needed for fluid. )   magnesium oxide (MAG-OX) 400 MG tablet Take 400 mg by mouth 2 (two) times daily.   traMADol (ULTRAM) 50 MG tablet Take 1-2 tablets (50-100 mg total) by mouth every 6 (six) hours as needed for moderate pain.    Scheduled Meds:   stroke: mapping our early stages of recovery book   Does not apply Once   aspirin  81 mg Oral Daily   Or   aspirin  81 mg Per Tube Daily   chlorhexidine gluconate (MEDLINE KIT)  15 mL Mouth Rinse BID   Chlorhexidine Gluconate Cloth  6 each Topical Daily   docusate  100 mg Oral BID   mouth rinse  15 mL Mouth Rinse 10 times per day   pantoprazole (PROTONIX) IV  40 mg Intravenous QHS   polyethylene glycol  17 g Oral Daily   sodium chloride flush  3 mL  Intravenous Once   ticagrelor  90 mg Oral BID   Or   ticagrelor  90 mg Per Tube BID   Continuous Infusions:  sodium chloride Stopped (12/30/19 0030)   sodium chloride     clevidipine     clevidipine     phenylephrine (NEO-SYNEPHRINE) Adult infusion 20 mcg/min (12/30/19 1500)   propofol (DIPRIVAN) infusion Stopped (12/30/19 1057)   PRN Meds:.acetaminophen **OR** acetaminophen (TYLENOL) oral liquid 160 mg/5 mL **OR** acetaminophen, fentaNYL  (SUBLIMAZE) injection, fentaNYL (SUBLIMAZE) injection, senna-docusate  Allergies:  Allergies  Allergen Reactions   Aspartame And Phenylalanine Other (See Comments)    unknown   Azithromycin Other (See Comments)    Unknown   Bee Venom Other (See Comments)    Hyper   Cortizone-5 [Hydrocortisone] Itching and Swelling   Olmesartan Other (See Comments)    unknown   Other Other (See Comments)    IV dye Dyes - unknown reaction    Penicillins Other (See Comments)    unknown   Valsartan Other (See Comments)    Unknown   Latex Rash and Other (See Comments)    Skin peels    History reviewed. No pertinent family history.  Social History:  reports that she has never smoked. She has never used smokeless tobacco. She reports that she does not drink alcohol and does not use drugs.  ROS: A complete review of systems could not be performed as the pt is intubated.   Physical Exam:  Vital signs in last 24 hours: Temp:  [97.4 F (36.3 C)-98.3 F (36.8 C)] 98.3 F (36.8 C) (06/28 1200) Pulse Rate:  [46-82] 64 (06/28 1500) Resp:  [12-35] 19 (06/28 1500) BP: (99-154)/(45-112) 122/51 (06/28 1500) SpO2:  [92 %-100 %] 100 % (06/28 1506) Arterial Line BP: (97-154)/(39-76) 121/56 (06/28 1500) FiO2 (%):  [30 %-60 %] 30 % (06/28 1506) Weight:  [95.7 kg] 95.7 kg (06/28 0234) Constitutional:  Intubated; No acute distress Cardiovascular: irregular Respiratory: Intubated GI: Abdomen is soft, nontender, nondistended, no abdominal masses GU: purewick in place; urine in container is dark red Lymphatic: No lymphadenopathy Neurologic: unable to assess Psychiatric: unable to assess  Laboratory Data:  Recent Labs    12/29/19 1802 12/29/19 1846 12/29/19 2300 12/30/19 0134 12/30/19 0500  WBC 5.8  --   --   --  7.8  HGB 11.8* 11.6* 9.2* 8.8* 8.4*  HCT 38.3 34.0* 27.0* 26.0* 26.6*  PLT 225  --   --   --  200    Recent Labs    12/29/19 1802 12/29/19 1846 12/29/19 2300  12/30/19 0134 12/30/19 0500  NA 141 140 139 141 141  K 5.3* 5.2* 4.4 4.3 4.5  CL 106 106 108  --  113*  GLUCOSE 109* 103* 134*  --  149*  BUN 32* 34* 26*  --  28*  CALCIUM 8.9  --   --   --  7.9*  CREATININE 1.76* 1.90* 1.50*  --  1.44*     Results for orders placed or performed during the hospital encounter of 12/29/19 (from the past 24 hour(s))  CBG monitoring, ED     Status: Abnormal   Collection Time: 12/29/19  5:55 PM  Result Value Ref Range   Glucose-Capillary 106 (H) 70 - 99 mg/dL   Comment 1 Notify RN    Comment 2 Document in Chart   Protime-INR     Status: None   Collection Time: 12/29/19  6:02 PM  Result Value Ref Range   Prothrombin Time  13.3 11.4 - 15.2 seconds   INR 1.1 0.8 - 1.2  APTT     Status: None   Collection Time: 12/29/19  6:02 PM  Result Value Ref Range   aPTT 25 24 - 36 seconds  CBC     Status: Abnormal   Collection Time: 12/29/19  6:02 PM  Result Value Ref Range   WBC 5.8 4.0 - 10.5 K/uL   RBC 3.67 (L) 3.87 - 5.11 MIL/uL   Hemoglobin 11.8 (L) 12.0 - 15.0 g/dL   HCT 38.3 36 - 46 %   MCV 104.4 (H) 80.0 - 100.0 fL   MCH 32.2 26.0 - 34.0 pg   MCHC 30.8 30.0 - 36.0 g/dL   RDW 13.7 11.5 - 15.5 %   Platelets 225 150 - 400 K/uL   nRBC 0.0 0.0 - 0.2 %  Differential     Status: None   Collection Time: 12/29/19  6:02 PM  Result Value Ref Range   Neutrophils Relative % 66 %   Neutro Abs 3.9 1.7 - 7.7 K/uL   Lymphocytes Relative 15 %   Lymphs Abs 0.9 0.7 - 4.0 K/uL   Monocytes Relative 12 %   Monocytes Absolute 0.7 0 - 1 K/uL   Eosinophils Relative 6 %   Eosinophils Absolute 0.4 0 - 0 K/uL   Basophils Relative 1 %   Basophils Absolute 0.0 0 - 0 K/uL   Immature Granulocytes 0 %   Abs Immature Granulocytes 0.01 0.00 - 0.07 K/uL  Comprehensive metabolic panel     Status: Abnormal   Collection Time: 12/29/19  6:02 PM  Result Value Ref Range   Sodium 141 135 - 145 mmol/L   Potassium 5.3 (H) 3.5 - 5.1 mmol/L   Chloride 106 98 - 111 mmol/L   CO2 24  22 - 32 mmol/L   Glucose, Bld 109 (H) 70 - 99 mg/dL   BUN 32 (H) 8 - 23 mg/dL   Creatinine, Ser 1.76 (H) 0.44 - 1.00 mg/dL   Calcium 8.9 8.9 - 10.3 mg/dL   Total Protein 6.2 (L) 6.5 - 8.1 g/dL   Albumin 3.2 (L) 3.5 - 5.0 g/dL   AST 17 15 - 41 U/L   ALT 11 0 - 44 U/L   Alkaline Phosphatase 82 38 - 126 U/L   Total Bilirubin 0.6 0.3 - 1.2 mg/dL   GFR calc non Af Amer 28 (L) >60 mL/min   GFR calc Af Amer 32 (L) >60 mL/min   Anion gap 11 5 - 15  I-stat chem 8, ED     Status: Abnormal   Collection Time: 12/29/19  6:46 PM  Result Value Ref Range   Sodium 140 135 - 145 mmol/L   Potassium 5.2 (H) 3.5 - 5.1 mmol/L   Chloride 106 98 - 111 mmol/L   BUN 34 (H) 8 - 23 mg/dL   Creatinine, Ser 1.90 (H) 0.44 - 1.00 mg/dL   Glucose, Bld 103 (H) 70 - 99 mg/dL   Calcium, Ion 1.06 (L) 1.15 - 1.40 mmol/L   TCO2 25 22 - 32 mmol/L   Hemoglobin 11.6 (L) 12.0 - 15.0 g/dL   HCT 34.0 (L) 36 - 46 %  SARS Coronavirus 2 by RT PCR (hospital order, performed in [facility] hospital lab) Nasopharyngeal Nasopharyngeal Swab     Status: None   Collection Time: 12/29/19  7:02 PM   Specimen: Nasopharyngeal Swab  Result Value Ref Range   SARS Coronavirus 2 NEGATIVE NEGATIVE  I-stat chem  8, ed     Status: Abnormal   Collection Time: 12/29/19 11:00 PM  Result Value Ref Range   Sodium 139 135 - 145 mmol/L   Potassium 4.4 3.5 - 5.1 mmol/L   Chloride 108 98 - 111 mmol/L   BUN 26 (H) 8 - 23 mg/dL   Creatinine, Ser 1.50 (H) 0.44 - 1.00 mg/dL   Glucose, Bld 134 (H) 70 - 99 mg/dL   Calcium, Ion 1.13 (L) 1.15 - 1.40 mmol/L   TCO2 22 22 - 32 mmol/L   Hemoglobin 9.2 (L) 12.0 - 15.0 g/dL   HCT 27.0 (L) 36 - 46 %  Type and screen [place]     Status: None   Collection Time: 12/29/19 11:30 PM  Result Value Ref Range   ABO/RH(D) A POS    Antibody Screen NEG    Sample Expiration      01/01/2020,2359 Performed at [facility], [place] [address] [place], [place] [address]   ABO/Rh     Status: None    Collection Time: 12/29/19 11:30 PM  Result Value Ref Range   ABO/RH(D)      A POS Performed at [facility] [address], [place], [place] [address]   MRSA PCR Screening     Status: None   Collection Time: 12/30/19 12:13 AM   Specimen: Nasopharyngeal  Result Value Ref Range   MRSA by PCR NEGATIVE NEGATIVE  I-STAT 7, (LYTES, BLD GAS, ICA, H+H)     Status: Abnormal   Collection Time: 12/30/19  1:34 AM  Result Value Ref Range   pH, Arterial 7.354 7.35 - 7.45   pCO2 arterial 39.7 32 - 48 mmHg   pO2, Arterial 242 (H) 83 - 108 mmHg   Bicarbonate 22.3 20.0 - 28.0 mmol/L   TCO2 24 22 - 32 mmol/L   O2 Saturation 100.0 %   Acid-base deficit 3.0 (H) 0.0 - 2.0 mmol/L   Sodium 141 135 - 145 mmol/L   Potassium 4.3 3.5 - 5.1 mmol/L   Calcium, Ion 1.15 1.15 - 1.40 mmol/L   HCT 26.0 (L) 36 - 46 %   Hemoglobin 8.8 (L) 12.0 - 15.0 g/dL   Patient temperature 97.4 F    Sample type ARTERIAL   Hemoglobin A1c     Status: None   Collection Time: 12/30/19  5:00 AM  Result Value Ref Range   Hgb A1c MFr Bld 5.2 4.8 - 5.6 %   Mean Plasma Glucose 102.54 mg/dL  Lipid panel     Status: None   Collection Time: 12/30/19  5:00 AM  Result Value Ref Range   Cholesterol 143 0 - 200 mg/dL   Triglycerides 61 <150 mg/dL   HDL 41 >40 mg/dL   Total CHOL/HDL Ratio 3.5 RATIO   VLDL 12 0 - 40 mg/dL   LDL Cholesterol 90 0 - 99 mg/dL  Basic metabolic panel     Status: Abnormal   Collection Time: 12/30/19  5:00 AM  Result Value Ref Range   Sodium 141 135 - 145 mmol/L   Potassium 4.5 3.5 - 5.1 mmol/L   Chloride 113 (H) 98 - 111 mmol/L   CO2 22 22 - 32 mmol/L   Glucose, Bld 149 (H) 70 - 99 mg/dL   BUN 28 (H) 8 - 23 mg/dL   Creatinine, Ser 1.44 (H) 0.44 - 1.00 mg/dL   Calcium 7.9 (L) 8.9 - 10.3 mg/dL   GFR calc non Af Amer 36 (L) >  60 mL/min   GFR calc Af Amer 41 (L) >60 mL/min   Anion gap 6 5 - 15  CBC with Differential/Platelet     Status: Abnormal   Collection Time: 12/30/19  5:00 AM  Result  Value Ref Range   WBC 7.8 4.0 - 10.5 K/uL   RBC 2.58 (L) 3.87 - 5.11 MIL/uL   Hemoglobin 8.4 (L) 12.0 - 15.0 g/dL   HCT 26.6 (L) 36 - 46 %   MCV 103.1 (H) 80.0 - 100.0 fL   MCH 32.6 26.0 - 34.0 pg   MCHC 31.6 30.0 - 36.0 g/dL   RDW 13.5 11.5 - 15.5 %   Platelets 200 150 - 400 K/uL   nRBC 0.0 0.0 - 0.2 %   Neutrophils Relative % 93 %   Neutro Abs 7.3 1.7 - 7.7 K/uL   Lymphocytes Relative 5 %   Lymphs Abs 0.4 (L) 0.7 - 4.0 K/uL   Monocytes Relative 2 %   Monocytes Absolute 0.1 0 - 1 K/uL   Eosinophils Relative 0 %   Eosinophils Absolute 0.0 0 - 0 K/uL   Basophils Relative 0 %   Basophils Absolute 0.0 0 - 0 K/uL   Immature Granulocytes 0 %   Abs Immature Granulocytes 0.03 0.00 - 0.07 K/uL  Triglycerides     Status: None   Collection Time: 12/30/19  5:00 AM  Result Value Ref Range   Triglycerides 64 <150 mg/dL   Recent Results (from the past 240 hour(s))  SARS CORONAVIRUS 2 (TAT 6-24 HRS) Nasopharyngeal Nasopharyngeal Swab     Status: None   Collection Time: 12/23/19 10:04 AM   Specimen: Nasopharyngeal Swab  Result Value Ref Range Status   SARS Coronavirus 2 NEGATIVE NEGATIVE Final    Comment: (NOTE) SARS-CoV-2 target nucleic acids are NOT DETECTED.  The SARS-CoV-2 RNA is generally detectable in upper and lower respiratory specimens during the acute phase of infection. Negative results do not preclude SARS-CoV-2 infection, do not rule out co-infections with other pathogens, and should not be used as the sole basis for treatment or other patient management decisions. Negative results must be combined with clinical observations, patient history, and epidemiological information. The expected result is Negative.  Fact Sheet for Patients: SugarRoll.be  Fact Sheet for Healthcare Providers: https://www.woods-mathews.com/  This test is not yet approved or cleared by the [place] FDA and  has been authorized for detection and/or  diagnosis of SARS-CoV-2 by FDA under an Emergency Use Authorization (EUA). This EUA will remain  in effect (meaning this test can be used) for the duration of the COVID-19 declaration under Se ction 564(b)(1) of the Act, 21 U.S.C. section 360bbb-3(b)(1), unless the authorization is terminated or revoked sooner.  Performed at [facility], [place] [address], [place], [place] [address]   SARS Coronavirus 2 by RT PCR (hospital order, performed in [facility] hospital lab) Nasopharyngeal Nasopharyngeal Swab     Status: None   Collection Time: 12/29/19  7:02 PM   Specimen: Nasopharyngeal Swab  Result Value Ref Range Status   SARS Coronavirus 2 NEGATIVE NEGATIVE Final    Comment: (NOTE) SARS-CoV-2 target nucleic acids are NOT DETECTED.  The SARS-CoV-2 RNA is generally detectable in upper and lower respiratory specimens during the acute phase of infection. The lowest concentration of SARS-CoV-2 viral copies this assay can detect is 250 copies / mL. A negative result does not preclude SARS-CoV-2 infection and should not be used as the sole basis for treatment or other patient  management decisions.  A negative result may occur with improper specimen collection / handling, submission of specimen other than nasopharyngeal swab, presence of viral mutation(s) within the areas targeted by this assay, and inadequate number of viral copies (<250 copies / mL). A negative result must be combined with clinical observations, patient history, and epidemiological information.  Fact Sheet for Patients:   StrictlyIdeas.no  Fact Sheet for Healthcare Providers: BankingDealers.co.za  This test is not yet approved or  cleared by the [place] FDA and has been authorized for detection and/or diagnosis of SARS-CoV-2 by FDA under an Emergency Use Authorization (EUA).  This EUA will remain in effect (meaning this test can be used) for the duration of  the COVID-19 declaration under Section 564(b)(1) of the Act, 21 U.S.C. section 360bbb-3(b)(1), unless the authorization is terminated or revoked sooner.  Performed at [facility], [place] [address] [place],  [address]   MRSA PCR Screening     Status: None   Collection Time: 12/30/19 12:13 AM   Specimen: Nasopharyngeal  Result Value Ref Range Status   MRSA by PCR NEGATIVE NEGATIVE Final    Comment:        The GeneXpert MRSA Assay (FDA approved for NASAL specimens only), is one component of a comprehensive MRSA colonization surveillance program. It is not intended to diagnose MRSA infection nor to guide or monitor treatment for MRSA infections. Performed at [facility], [place] [address], [place],  [address]     Renal Function: Recent Labs    12/29/19 1802 12/29/19 1846 12/29/19 2300 12/30/19 0500  CREATININE 1.76* 1.90* 1.50* 1.44*   Estimated Creatinine Clearance: 37 mL/min (A) (by C-G formula based on SCr of 1.44 mg/dL (H)).  Radiologic Imaging: CT ABDOMEN PELVIS WO CONTRAST  Result Date: 12/29/2019 CLINICAL DATA:  Right inguinal hematoma, recent neuro interventional procedure, assess for retroperitoneal hematoma EXAM: CT ABDOMEN AND PELVIS WITHOUT CONTRAST TECHNIQUE: Multidetector CT imaging of the abdomen and pelvis was performed following the standard protocol without IV contrast. COMPARISON:  11/08/2019 FINDINGS: Lower chest: Hypoventilatory changes are seen within the dependent lower lobes. Heart is enlarged with trace pericardial fluid unchanged. Hepatobiliary: Gallbladder surgically absent. Unenhanced imaging the liver demonstrates no gross abnormalities. Pancreas: Unremarkable. No pancreatic ductal dilatation or surrounding inflammatory changes. Spleen: Normal in size without focal abnormality. Adrenals/Urinary Tract: Excreted contrast is seen within the bilateral kidneys related to previous neuro interventional procedure. Bilateral renal  cortical thinning is noted. Stable bilateral adrenal adenoma. Bilateral ureteral stents extend from the renal pelves into the bladder lumen. No filling defects within the bladder. Stomach/Bowel: Enteric catheter extends into the gastric lumen. No bowel obstruction or ileus. No bowel wall thickening or inflammatory change. Vascular/Lymphatic: Aortic atherosclerosis. No enlarged abdominal or pelvic lymph nodes. Reproductive: Uterus and bilateral adnexa are unremarkable. Other: Hematoma is seen within the right inguinal region extending along the inguinal crease into the pannus of the right lower quadrant abdominal wall. This measures up to 3.7 cm in thickness. There is no evidence of extension into the retroperitoneal space. At the time of the exam, extrinsic compression is being held at the right groin. There is no free fluid or free gas within the peritoneal cavity. Fat containing hernia again noted within the left lower quadrant abdominal wall. No bowel herniation. Musculoskeletal: No acute or destructive bony lesions. Reconstructed images demonstrate no additional findings. IMPRESSION: 1. Hematoma within the right inguinal region extending along the inguinal crease into the pannus of the right lower quadrant  abdominal wall. No extension into the retroperitoneal space. 2. Bilateral ureteral stents as above. 3. Stable bilateral adrenal adenoma. 4. Aortic Atherosclerosis (ICD10-I70.0). Electronically Signed   By: Randa Ngo M.D.   On: 12/29/2019 23:48   CT Code Stroke CTA Head W/WO contrast  Result Date: 12/29/2019 CLINICAL DATA:  Left-sided weakness, code stroke follow-up EXAM: CT ANGIOGRAPHY HEAD AND NECK TECHNIQUE: Multidetector CT imaging of the head and neck was performed using the standard protocol during bolus administration of intravenous contrast. Multiplanar CT image reconstructions and MIPs were obtained to evaluate the vascular anatomy. Carotid stenosis measurements (when applicable) are obtained  utilizing NASCET criteria, using the distal internal carotid diameter as the denominator. CONTRAST:  10m OMNIPAQUE IOHEXOL 350 MG/ML SOLN COMPARISON:  None. FINDINGS: CTA NECK Aortic arch: Minimal calcified plaque along the aortic arch. Mild calcified plaque at the great vessel origins, which are patent. Right carotid system: Patent. Common carotid has a retropharyngeal course. Mild calcified plaque at the ICA origin causing minimal stenosis. Left carotid system: Patent. Common carotid has a retropharyngeal course. Mild calcified plaque at the ICA origin causing minimal stenosis. Vertebral arteries: Patent. Left vertebral artery is dominant. Suspected stenosis of the proximal right vertebral artery. Skeleton: Degenerative changes of the cervical spine. Other neck: No mass or adenopathy. Upper chest: Enlargement of the main pulmonary artery suggesting pulmonary arterial hypertension. Review of the MIP images confirms the above findings CTA HEAD Anterior circulation: Intracranial internal carotid arteries are patent with calcified plaque causing mild stenosis. Anterior cerebral arteries are patent. Left A1 ACA is dominant with diminutive or absent right A1 segment. There is occlusion of the mid right M1 MCA. There is preserved opacification of the more distal right MCA territory. Left MCA is patent. Posterior circulation: Intracranial vertebral arteries are patent. Basilar artery is patent. Posterior cerebral arteries are patent. There are posterior communicating arteries present bilaterally with fetal origin of the right PCA. Venous sinuses: Patent as allowed by contrast bolus timing. Review of the MIP images confirms the above findings IMPRESSION: Occlusion of the right M1 MCA. However, there is good filling of the more distal MCA territory. No hemodynamically significant stenosis in the neck. These results were communicated to Dr. ALorraine Laxat [place]6/27/2021by text page via the [facility] LLCmessaging system.  Electronically Signed   By: PMacy MisM.D.   On: 12/29/2019 19:10   CT HEAD WO CONTRAST  Result Date: 12/29/2019 CLINICAL DATA:  Stroke follow-up.  Status post thrombectomy EXAM: CT HEAD WITHOUT CONTRAST TECHNIQUE: Contiguous axial images were obtained from the base of the skull through the vertex without intravenous contrast. COMPARISON:  Head CT 12/29/2019 FINDINGS: Brain: There is hyperdense material in the right subarachnoid space, overlying the frontal operculum and within the sylvian fissure. No other extra-axial collection. No midline shift or other mass effect. No intraparenchymal hemorrhage. 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 Vascular: Status post right MCA stent placement. Skull: Normal. Negative for fracture or focal lesion. Sinuses/Orbits: No acute finding. Other: None. IMPRESSION: 1. Hyperdense material in the right subarachnoid space, overlying the frontal operculum and within the Sylvian fissure. This is most likely contrast staining, but follow-up studies will be necessary to differentiate from subarachnoid hemorrhage. 2. Unchanged left parafalcine meningioma. Electronically Signed   By: KUlyses JarredM.D.   On: 12/29/2019 23:49   CT Code Stroke CTA Neck W/WO contrast  Result Date: 12/29/2019 CLINICAL DATA:  Left-sided weakness, code stroke follow-up EXAM: CT ANGIOGRAPHY HEAD AND NECK TECHNIQUE: Multidetector CT imaging of the head  and neck was performed using the standard protocol during bolus administration of intravenous contrast. Multiplanar CT image reconstructions and MIPs were obtained to evaluate the vascular anatomy. Carotid stenosis measurements (when applicable) are obtained utilizing NASCET criteria, using the distal internal carotid diameter as the denominator. CONTRAST:  77m OMNIPAQUE IOHEXOL 350 MG/ML SOLN COMPARISON:  None. FINDINGS: CTA NECK Aortic arch: Minimal calcified plaque along the aortic arch. Mild calcified plaque at the great vessel origins, which are  patent. Right carotid system: Patent. Common carotid has a retropharyngeal course. Mild calcified plaque at the ICA origin causing minimal stenosis. Left carotid system: Patent. Common carotid has a retropharyngeal course. Mild calcified plaque at the ICA origin causing minimal stenosis. Vertebral arteries: Patent. Left vertebral artery is dominant. Suspected stenosis of the proximal right vertebral artery. Skeleton: Degenerative changes of the cervical spine. Other neck: No mass or adenopathy. Upper chest: Enlargement of the main pulmonary artery suggesting pulmonary arterial hypertension. Review of the MIP images confirms the above findings CTA HEAD Anterior circulation: Intracranial internal carotid arteries are patent with calcified plaque causing mild stenosis. Anterior cerebral arteries are patent. Left A1 ACA is dominant with diminutive or absent right A1 segment. There is occlusion of the mid right M1 MCA. There is preserved opacification of the more distal right MCA territory. Left MCA is patent. Posterior circulation: Intracranial vertebral arteries are patent. Basilar artery is patent. Posterior cerebral arteries are patent. There are posterior communicating arteries present bilaterally with fetal origin of the right PCA. Venous sinuses: Patent as allowed by contrast bolus timing. Review of the MIP images confirms the above findings IMPRESSION: Occlusion of the right M1 MCA. However, there is good filling of the more distal MCA territory. No hemodynamically significant stenosis in the neck. These results were communicated to Dr. ALorraine Laxat [place]6/27/2021by text page via the [facility]. Electronically Signed   By: PMacy MisM.D.   On: 12/29/2019 19:10   DG Chest Port 1 View  Result Date: 12/30/2019 CLINICAL DATA:  Intubation. EXAM: PORTABLE CHEST 1 VIEW COMPARISON:  Radiograph 11/08/2019. FINDINGS: Low positioning of the endotracheal tube 6 mm from the carina. Enteric tube in place  with tip below the diaphragm not included in this chest field of view. Stable cardiomegaly. Unchanged mediastinal contours. Interstitial coarsening which appears chronic. No focal airspace disease, pleural effusion, or pneumothorax. No acute osseous abnormalities are seen. IMPRESSION: 1. Low positioning of the endotracheal tube 6 mm from the carina. Retraction of 2-3 cm recommended. 2. Enteric tube in place with tip below the diaphragm. 3. Stable cardiomegaly and chronic interstitial coarsening. Electronically Signed   By: MKeith RakeM.D.   On: 12/30/2019 01:11   DG Abd Portable 1V  Result Date: 12/30/2019 CLINICAL DATA:  OG tube placement. EXAM: PORTABLE ABDOMEN - 1 VIEW COMPARISON:  Abdomen pelvis CT yesterday. FINDINGS: Tip and side port of the enteric tube below the diaphragm in the stomach. Excreted IV contrast within both renal collecting systems and urinary bladder. Bilateral ureteral stents in place. Nephrograms demonstrate mild hydronephrosis. Nonobstructive bowel gas pattern. IMPRESSION: 1. Tip and side port of the enteric tube below the diaphragm in the stomach. 2. Bilateral ureteral stents in place. Excreted IV contrast in both renal collecting systems in the urinary bladder with mild bilateral hydronephrosis. Electronically Signed   By: MKeith RakeM.D.   On: 12/30/2019 01:13   VAS [place]OIN PSEUDOANEURYSM  Result Date: 12/30/2019  ARTERIAL PSEUDOANEURYSM  Exam: Right groin Indications: Patient complains of  bruising. Comparison Study: no prior Performing Technologist: Abram Sander RVS  Examination Guidelines: A complete evaluation includes B-mode imaging, spectral Doppler, color Doppler, and power Doppler as needed of all accessible portions of each vessel. Bilateral testing is considered an integral part of a complete examination. Limited examinations for reoccurring indications may be performed as noted. +------------+----------+--------+------+----------+  Right Duplex PSV  (cm/s) Waveform Plaque Comment(s)  +------------+----------+--------+------+----------+  CFA             147     biphasic                    +------------+----------+--------+------+----------+  Prox SFA        146     biphasic                    +------------+----------+--------+------+----------+  Summary: No evidence of pseudoaneurysm, AVF or DVT    --------------------------------------------------------------------------------    Preliminary    CT HEAD CODE STROKE WO CONTRAST  Result Date: 12/29/2019 CLINICAL DATA:  Code stroke.  Left-sided weakness EXAM: CT HEAD WITHOUT CONTRAST TECHNIQUE: Contiguous axial images were obtained from the base of the skull through the vertex without intravenous contrast. COMPARISON:  None. FINDINGS: Brain: There is no acute intracranial hemorrhage or mass effect. Gray-white differentiation is preserved. Ventricles and sulci are within normal limits in size and configuration. Patchy hypoattenuation in the supratentorial white matter is nonspecific but probably reflects chronic microvascular ischemic changes. Extra-axial dural-based hyperdense lesion along the high left frontal convexity and falx measuring 2.6 x 1.8 x 2.3 cm is consistent with a meningioma. There is no apparent underlying edema. This abuts the superior sagittal sinus. Vascular: No hyperdense vessel. Mild intracranial atherosclerotic calcification at the skull base. Skull: Unremarkable. Sinuses/Orbits: No acute abnormality. Other: Mastoid air cells are clear. ASPECTS ([place] Stroke Program Early CT Score) - Ganglionic level infarction (caudate, lentiform nuclei, internal capsule, insula, M1-M3 cortex): 7 - Supraganglionic infarction (M4-M6 cortex): 3 Total score (0-10 with 10 being normal): 10 IMPRESSION: No acute intracranial hemorrhage or evidence of acute infarction. ASPECT score is 10. Chronic microvascular ischemic changes. Left frontal meningioma without significant mass effect or underlying edema.  Initial results were communicated to Dr. Lorraine Lax at [place] 6/27/2021by text page via the [facility] messaging system. Electronically Signed   By: Macy Mis M.D.   On: 12/29/2019 18:20    Impression/Recommendation:  Hematuria--due to stents in pt on multiple anticoagulants. Will refrain from placing cath (due to stent tethers being in urethra) as long as pt continues to void without difficulty with purewick. Pts stents are scheduled to be removed on Weds.  Will discuss with Dr. Louis Meckel timing of removal.  UA at this time would appear infected due to stents and would require a cath placement for sterile specimen.  Pt is afebrile, therefore, will wait until stents are removed or pt condition changes. Hematuria should improved after stents are removed. Monitor H/H.  Her hematoma is a greater concern for anemia than the hematuria.   Debbrah Alar 12/30/2019, 4:12 PM

## 2019-12-30 NOTE — Progress Notes (Signed)
PT Cancellation Note  Patient Details Name: Gloria [name] MRN: [id number] DOB: [birth year]   Cancelled Treatment:    Reason Eval/Treat Not Completed: Patient not medically ready.  Pt intubated.  Checked in with RN re: potential for extubation today and he said there is a possibility as she is weaning on low vent settings.  He reports that it is complicated by her being deaf.  He recommended checking back later.  She has only been here 16 hours and had IR and tPA, so not at our 24 hour threshold yet either.  PT to check back later today or tomorrow to check readiness for assessment. She seems to be spontaneously move her right side.   Thanks,  Verdene Lennert, PT, DPT  Acute Rehabilitation [phone] pager #[phone] office       Gloria [name] 12/30/2019, 10:45 AM

## 2019-12-30 NOTE — Progress Notes (Signed)
eLink Physician-Brief Progress Note Patient Name: Gloria [name] DOB: 06-Jan-1946 MRN: [id number]   Date of Service  12/30/2019  HPI/Events of Note  Patient arrives in ICU intubated and ventilated.   eICU Interventions  Plan: 1. Ventilator orders: 60%/PRVC 15/TV 400/P 5. 2. ABG now.      Intervention Category Major Interventions: Respiratory failure - evaluation and management  Eli Adami Eugene 12/30/2019, 12:27 AM

## 2019-12-30 NOTE — Progress Notes (Addendum)
NAME:  Gloria [name], MRN:  [id number], DOB:  05-08-1946, LOS: 1 ADMISSION DATE:  12/29/2019, CONSULTATION DATE:  12/29/2019 REFERRING MD:  Neurology, CHIEF COMPLAINT:  Left sided weakness  Brief History   74 year old woman with history of hypertension, A. fib not on anticoagulation presenting with left-sided weakness and right gaze preference. 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 Now s/p tPA, revascularization of right MCA M1 segment as well as placement of rescue stent in the right MCA.  Postprocedure has hematoma within the right inguinal region extending along the inguinal crease into the pannus of the right lower quadrant abdominal wall.  No extension into the retroperitoneal space.  History of present illness   74 year old woman with history of hypertension, A. fib not on anticoagulation presenting with left-sided weakness and right gaze preference. History obtained from EMR and nursing due to patient intubated/sedated.  Per EMR she was last known normal at [name].  She was found by her brother when he returned from the store lying on the ground.  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  A code stroke was called.  CT head was negative for hemorrhage.  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 She was given tPA. CTA showed right M1 occlusion.  She underwent revascularization of right MCA M1 segment as well as placement of rescue stent in the right MCA.  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  She was sent for CT abdomen pelvis without contrast.  There is a hematoma within the right inguinal region extending along the inguinal crease into the pannus of the right lower quadrant abdominal wall.  No extension into the retroperitoneal space.  Past Medical History  hypertension, A. fib not on anticoagulation (due to high risk falls)  Hearing aid for left aid and needs glasses for lip riding primarily, does not sign Chronic right knee pain  Significant Hospital Events     6/27> admit, tPA, revascularization of right M1 occlusion as well as stent placement  Consults:  PCCM 6/27  Procedures:  ETT 6/27> Left Aline 6/27 >  Significant Diagnostic Tests:  CT abd/pelvis w/o contrast 6/27>>Hematoma within the right inguinal region extending along the inguinal crease into the pannus of the right lower quadrant abdominal wall. No extension into the retroperitoneal space.Bilateral ureteral stents. Stable bilateral adrenal adenoma.  CT head w/o contrast 6/27>>Hyperdense material in the right subarachnoid space, overlying the frontal operculum and within the Sylvian fissure. Unchanged left parafalcine meningioma.  CTA head/neck 6/27>>Occlusion of the right M1 MCA. However, there is good filling of the more distal MCA territory. No hemodynamically significant stenosis in the neck.  Micro Data:  COVID19 6/27>> neg  Antimicrobials:  None  Interim history/subjective:  On minimal propofol RN reports increased bloody urine  Objective   Blood pressure 117/61, pulse (!) 53, temperature 98.1 F (36.7 C), temperature source Axillary, resp. rate 20, weight 95.7 kg, SpO2 100 %.    Vent Mode: PSV;CPAP FiO2 (%):  [30 %-60 %] 30 % Set Rate:  [15 bmp] 15 bmp Vt Set:  [400 mL] 400 mL PEEP:  [5 cmH20] 5 cmH20 Pressure Support:  [5 cmH20] 5 cmH20 Plateau Pressure:  [16 cmH20-18 cmH20] 16 cmH20   Intake/Output Summary (Last 24 hours) at 12/30/2019 1104 Last data filed at 12/30/2019 1000 Gross per 24 hour  Intake 3603.08 ml  Output 500 ml  Net 3103.08 ml   Filed Weights   12/29/19 1800 12/30/19 0234  Weight: 95.7 kg 95.7 kg  Examination: General:  Elderly female in NAD on mechanical ventilation HEENT: MM pink/moist, ETT, OGT, pupils 3/reactive, some ecchymosis over right eyelid Neuro: does not open eyes, moves right side spontaneously and localizes, moving LLE spontaneously, withdrawals in LUE; exam partially limited given her impaired hearing and sedation  CV:  IRIR, stable R groin site PULM:  Non labored, doing well on PSV 5/5, lungs clear  GI: obese, soft, bs active, purwick Extremities: warm/dry, no LE edema  Skin: no rashes   Assessment & Plan:  74 year old woman with history of hypertension, A. fib not on anticoagulation presenting with left-sided weakness and right gaze preference. 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 Now s/p tPA, revascularization of right MCA M1 segment as well as placement of rescue stent in the right MCA.  Postprocedure has hematoma within the right inguinal region extending along the inguinal crease into the pannus of the right lower quadrant abdominal wall.  No extension into the retroperitoneal space.  Acute respiratory insufficiency: Intubated for airway protection - full MV support, tolerating SBT, hopeful to extubate soon once more awake - Wean O2 for goal O2 sat >89% - to remain NPO, will need SLP post extubation - may need to switch PAD Protocol from propofol to precedex with prn fentanyl with bowel regimen   MCA M1 occlusion, CVA:  -- telemetry monitoring in ICU -- continue Neosynephrine for BP goal per Neuro IR SBP 120-140 -- brillinta/ ASA per NIR -- MRI brain planned for today at 1600 -- Echocardiogram pending -- HgbA1c 5.2, fasting lipid panel pending -- PT/ OT/ SLP when appropriate  -- continue frequent neuro checks  -- Frequent neuro checks  Post procedure hematoma: distal pulses remain intact, site unchanged and H/H levels stable  History of atrial fibrillation, HTN: CHA2DS2-VASc score of at least 5 - previously not on AC due to high risk of falls - Will need to be started on anticoagulation eventually  HOH - brother states she does not use sign language, requires her left hearing aid and glasses and primarily read lips  Hematuria with ureteral stent exchange 6/23 2/2 bilateral renal stones - UOP remains adequate with stable renal function - urology consulted by primary team, as chart review  patient planned for stent removal in 1 week from 6/23  Best practice:  Diet: NPO Pain/Anxiety/Delirium protocol (if indicated): Propofol gtt, fent prn VAP protocol (if indicated): Ordered DVT prophylaxis: SCDs GI prophylaxis: PPI Glucose control: Monitor Mobility: PT/OT when able Code Status: FULL Family Communication: brother, [name] updated by phone.  Disposition:  Neuro ICU  Labs   CBC: Recent Labs  Lab 12/29/19 1802 12/29/19 1846 12/29/19 2300 12/30/19 0134 12/30/19 0500  WBC 5.8  --   --   --  7.8  NEUTROABS 3.9  --   --   --  7.3  HGB 11.8* 11.6* 9.2* 8.8* 8.4*  HCT 38.3 34.0* 27.0* 26.0* 26.6*  MCV 104.4*  --   --   --  103.1*  PLT 225  --   --   --  765    Basic Metabolic Panel: Recent Labs  Lab 12/29/19 1802 12/29/19 1846 12/29/19 2300 12/30/19 0134 12/30/19 0500  NA 141 140 139 141 141  K 5.3* 5.2* 4.4 4.3 4.5  CL 106 106 108  --  113*  CO2 24  --   --   --  22  GLUCOSE 109* 103* 134*  --  149*  BUN 32* 34* 26*  --  28*  CREATININE 1.76* 1.90*  1.50*  --  1.44*  CALCIUM 8.9  --   --   --  7.9*   GFR: Estimated Creatinine Clearance: 37 mL/min (A) (by C-G formula based on SCr of 1.44 mg/dL (H)). Recent Labs  Lab 12/29/19 1802 12/30/19 0500  WBC 5.8 7.8    Liver Function Tests: Recent Labs  Lab 12/29/19 1802  AST 17  ALT 11  ALKPHOS 82  BILITOT 0.6  PROT 6.2*  ALBUMIN 3.2*   ABG    Component Value Date/Time   PHART 7.354 12/30/2019 0134   PCO2ART 39.7 12/30/2019 0134   PO2ART 242 (H) 12/30/2019 0134   HCO3 22.3 12/30/2019 0134   TCO2 24 12/30/2019 0134   ACIDBASEDEF 3.0 (H) 12/30/2019 0134   O2SAT 100.0 12/30/2019 0134     Coagulation Profile: Recent Labs  Lab 12/29/19 1802  INR 1.1   CBG: Recent Labs  Lab 12/29/19 1755  GLUCAP 106*     CCT: 35 mins  Kennieth Rad, MSN, AGACNP-BC [facility] 12/30/2019, 11:04 AM  See Shea Evans for personal pager PCCM on call pager [phone]

## 2019-12-30 NOTE — Progress Notes (Signed)
eLink Physician-Brief Progress Note Patient Name: Gloria [name] DOB: 1946-06-22 MRN: [id number]   Date of Service  12/30/2019  HPI/Events of Note  Hypotension - BP = 99/58. Goal SBP = 120-140.  eICU Interventions  Plan: 1. Phenylephrine IV infusion. Titrate to SBP = 120-140.      Intervention Category Major Interventions: Hypotension - evaluation and management  Aldene Hendon Eugene 12/30/2019, 1:19 AM

## 2019-12-30 NOTE — Sedation Documentation (Signed)
Pt arrived to 4N29. SBAR given to [employer], Therapist, sports. All questions answered to satisfaction.

## 2019-12-30 NOTE — Progress Notes (Addendum)
STROKE TEAM PROGRESS NOTE   SUBJECTIVE (INTERVAL HISTORY) Her RN is at the bedside.  Pt still intubated but on weaning. On propofol. Eyes closed, not open on voice or pain. Still purposeful movement on the right UE, withdraw to pain on the right LE, slight withdraw LUE and LLE to pain. Her right groin hematoma stable, CT abd/pelvis overnight no retroperitoneal hemorrhage. She has hematuria, s/p b/l urethral stents. Hb dropped from admission 11.8 to 8.4 this am, combination from procedure blood loss, groin hematoma and hematuria. Urology was consulted on 12/30/19 due to hematuria.   OBJECTIVE Temp:  [97.4 F (36.3 C)-98.1 F (36.7 C)] 98.1 F (36.7 C) (06/28 0800) Pulse Rate:  [46-82] 59 (06/28 0900) Cardiac Rhythm: Atrial fibrillation (06/28 0800) Resp:  [12-35] 19 (06/28 0900) BP: (99-154)/(47-112) 124/112 (06/28 0800) SpO2:  [92 %-100 %] 100 % (06/28 0900) Arterial Line BP: (97-154)/(39-76) 134/62 (06/28 0900) FiO2 (%):  [30 %-60 %] 30 % (06/28 0800) Weight:  [95.7 kg] 95.7 kg (06/28 0234)  Recent Labs  Lab 12/29/19 1755  GLUCAP 106*   Recent Labs  Lab 12/29/19 1802 12/29/19 1846 12/29/19 2300 12/30/19 0134 12/30/19 0500  NA 141 140 139 141 141  K 5.3* 5.2* 4.4 4.3 4.5  CL 106 106 108  --  113*  CO2 24  --   --   --  22  GLUCOSE 109* 103* 134*  --  149*  BUN 32* 34* 26*  --  28*  CREATININE 1.76* 1.90* 1.50*  --  1.44*  CALCIUM 8.9  --   --   --  7.9*   Recent Labs  Lab 12/29/19 1802  AST 17  ALT 11  ALKPHOS 82  BILITOT 0.6  PROT 6.2*  ALBUMIN 3.2*   Recent Labs  Lab 12/29/19 1802 12/29/19 1846 12/29/19 2300 12/30/19 0134 12/30/19 0500  WBC 5.8  --   --   --  7.8  NEUTROABS 3.9  --   --   --  7.3  HGB 11.8* 11.6* 9.2* 8.8* 8.4*  HCT 38.3 34.0* 27.0* 26.0* 26.6*  MCV 104.4*  --   --   --  103.1*  PLT 225  --   --   --  200   No results for input(s): CKTOTAL, CKMB, CKMBINDEX, TROPONINI in the last 168 hours. Recent Labs    12/29/19 1802  LABPROT 13.3   INR 1.1   No results for input(s): COLORURINE, LABSPEC, PHURINE, GLUCOSEU, HGBUR, BILIRUBINUR, KETONESUR, PROTEINUR, UROBILINOGEN, NITRITE, LEUKOCYTESUR in the last 72 hours.  Invalid input(s): APPERANCEUR     Component Value Date/Time   CHOL 143 12/30/2019 0500   TRIG 61 12/30/2019 0500   TRIG 64 12/30/2019 0500   HDL 41 12/30/2019 0500   CHOLHDL 3.5 12/30/2019 0500   VLDL 12 12/30/2019 0500   LDLCALC 90 12/30/2019 0500   Lab Results  Component Value Date   HGBA1C 5.2 12/30/2019   No results found for: LABOPIA, COCAINSCRNUR, LABBENZ, AMPHETMU, THCU, LABBARB  No results for input(s): ETH in the last 168 hours.  I have personally reviewed the radiological images below and agree with the radiology interpretations.  CT ABDOMEN PELVIS WO CONTRAST  Result Date: 12/29/2019 CLINICAL DATA:  Right inguinal hematoma, recent neuro interventional procedure, assess for retroperitoneal hematoma EXAM: CT ABDOMEN AND PELVIS WITHOUT CONTRAST TECHNIQUE: Multidetector CT imaging of the abdomen and pelvis was performed following the standard protocol without IV contrast. COMPARISON:  11/08/2019 FINDINGS: Lower chest: Hypoventilatory changes are seen within the dependent  lower lobes. Heart is enlarged with trace pericardial fluid unchanged. Hepatobiliary: Gallbladder surgically absent. Unenhanced imaging the liver demonstrates no gross abnormalities. Pancreas: Unremarkable. No pancreatic ductal dilatation or surrounding inflammatory changes. Spleen: Normal in size without focal abnormality. Adrenals/Urinary Tract: Excreted contrast is seen within the bilateral kidneys related to previous neuro interventional procedure. Bilateral renal cortical thinning is noted. Stable bilateral adrenal adenoma. Bilateral ureteral stents extend from the renal pelves into the bladder lumen. No filling defects within the bladder. Stomach/Bowel: Enteric catheter extends into the gastric lumen. No bowel obstruction or ileus. No  bowel wall thickening or inflammatory change. Vascular/Lymphatic: Aortic atherosclerosis. No enlarged abdominal or pelvic lymph nodes. Reproductive: Uterus and bilateral adnexa are unremarkable. Other: Hematoma is seen within the right inguinal region extending along the inguinal crease into the pannus of the right lower quadrant abdominal wall. This measures up to 3.7 cm in thickness. There is no evidence of extension into the retroperitoneal space. At the time of the exam, extrinsic compression is being held at the right groin. There is no free fluid or free gas within the peritoneal cavity. Fat containing hernia again noted within the left lower quadrant abdominal wall. No bowel herniation. Musculoskeletal: No acute or destructive bony lesions. Reconstructed images demonstrate no additional findings. IMPRESSION: 1. Hematoma within the right inguinal region extending along the inguinal crease into the pannus of the right lower quadrant abdominal wall. No extension into the retroperitoneal space. 2. Bilateral ureteral stents as above. 3. Stable bilateral adrenal adenoma. 4. Aortic Atherosclerosis (ICD10-I70.0). Electronically Signed   By: Randa Ngo M.D.   On: 12/29/2019 23:48   CT Code Stroke CTA Head W/WO contrast  Result Date: 12/29/2019 CLINICAL DATA:  Left-sided weakness, code stroke follow-up EXAM: CT ANGIOGRAPHY HEAD AND NECK TECHNIQUE: Multidetector CT imaging of the head and neck was performed using the standard protocol during bolus administration of intravenous contrast. Multiplanar CT image reconstructions and MIPs were obtained to evaluate the vascular anatomy. Carotid stenosis measurements (when applicable) are obtained utilizing NASCET criteria, using the distal internal carotid diameter as the denominator. CONTRAST:  62mL OMNIPAQUE IOHEXOL 350 MG/ML SOLN COMPARISON:  None. FINDINGS: CTA NECK Aortic arch: Minimal calcified plaque along the aortic arch. Mild calcified plaque at the great  vessel origins, which are patent. Right carotid system: Patent. Common carotid has a retropharyngeal course. Mild calcified plaque at the ICA origin causing minimal stenosis. Left carotid system: Patent. Common carotid has a retropharyngeal course. Mild calcified plaque at the ICA origin causing minimal stenosis. Vertebral arteries: Patent. Left vertebral artery is dominant. Suspected stenosis of the proximal right vertebral artery. Skeleton: Degenerative changes of the cervical spine. Other neck: No mass or adenopathy. Upper chest: Enlargement of the main pulmonary artery suggesting pulmonary arterial hypertension. Review of the MIP images confirms the above findings CTA HEAD Anterior circulation: Intracranial internal carotid arteries are patent with calcified plaque causing mild stenosis. Anterior cerebral arteries are patent. Left A1 ACA is dominant with diminutive or absent right A1 segment. There is occlusion of the mid right M1 MCA. There is preserved opacification of the more distal right MCA territory. Left MCA is patent. Posterior circulation: Intracranial vertebral arteries are patent. Basilar artery is patent. Posterior cerebral arteries are patent. There are posterior communicating arteries present bilaterally with fetal origin of the right PCA. Venous sinuses: Patent as allowed by contrast bolus timing. Review of the MIP images confirms the above findings IMPRESSION: Occlusion of the right M1 MCA. However, there is good filling of the  more distal MCA territory. No hemodynamically significant stenosis in the neck. These results were communicated to Dr. Lorraine Lax at [place] 6/27/2021by text page via the [facility] messaging system. Electronically Signed   By: Macy Mis M.D.   On: 12/29/2019 19:10   CT HEAD WO CONTRAST  Result Date: 12/29/2019 CLINICAL DATA:  Stroke follow-up.  Status post thrombectomy EXAM: CT HEAD WITHOUT CONTRAST TECHNIQUE: Contiguous axial images were obtained from the base of the  skull through the vertex without intravenous contrast. COMPARISON:  Head CT 12/29/2019 FINDINGS: Brain: There is hyperdense material in the right subarachnoid space, overlying the frontal operculum and within the sylvian fissure. No other extra-axial collection. No midline shift or other mass effect. No intraparenchymal hemorrhage. 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 Vascular: Status post right MCA stent placement. Skull: Normal. Negative for fracture or focal lesion. Sinuses/Orbits: No acute finding. Other: None. IMPRESSION: 1. Hyperdense material in the right subarachnoid space, overlying the frontal operculum and within the Sylvian fissure. This is most likely contrast staining, but follow-up studies will be necessary to differentiate from subarachnoid hemorrhage. 2. Unchanged left parafalcine meningioma. Electronically Signed   By: Ulyses Jarred M.D.   On: 12/29/2019 23:49   CT Code Stroke CTA Neck W/WO contrast  Result Date: 12/29/2019 CLINICAL DATA:  Left-sided weakness, code stroke follow-up EXAM: CT ANGIOGRAPHY HEAD AND NECK TECHNIQUE: Multidetector CT imaging of the head and neck was performed using the standard protocol during bolus administration of intravenous contrast. Multiplanar CT image reconstructions and MIPs were obtained to evaluate the vascular anatomy. Carotid stenosis measurements (when applicable) are obtained utilizing NASCET criteria, using the distal internal carotid diameter as the denominator. CONTRAST:  59mL OMNIPAQUE IOHEXOL 350 MG/ML SOLN COMPARISON:  None. FINDINGS: CTA NECK Aortic arch: Minimal calcified plaque along the aortic arch. Mild calcified plaque at the great vessel origins, which are patent. Right carotid system: Patent. Common carotid has a retropharyngeal course. Mild calcified plaque at the ICA origin causing minimal stenosis. Left carotid system: Patent. Common carotid has a retropharyngeal course. Mild calcified plaque at the ICA origin causing minimal  stenosis. Vertebral arteries: Patent. Left vertebral artery is dominant. Suspected stenosis of the proximal right vertebral artery. Skeleton: Degenerative changes of the cervical spine. Other neck: No mass or adenopathy. Upper chest: Enlargement of the main pulmonary artery suggesting pulmonary arterial hypertension. Review of the MIP images confirms the above findings CTA HEAD Anterior circulation: Intracranial internal carotid arteries are patent with calcified plaque causing mild stenosis. Anterior cerebral arteries are patent. Left A1 ACA is dominant with diminutive or absent right A1 segment. There is occlusion of the mid right M1 MCA. There is preserved opacification of the more distal right MCA territory. Left MCA is patent. Posterior circulation: Intracranial vertebral arteries are patent. Basilar artery is patent. Posterior cerebral arteries are patent. There are posterior communicating arteries present bilaterally with fetal origin of the right PCA. Venous sinuses: Patent as allowed by contrast bolus timing. Review of the MIP images confirms the above findings IMPRESSION: Occlusion of the right M1 MCA. However, there is good filling of the more distal MCA territory. No hemodynamically significant stenosis in the neck. These results were communicated to Dr. Lorraine Lax at [place] 6/27/2021by text page via the [facility] messaging system. Electronically Signed   By: Macy Mis M.D.   On: 12/29/2019 19:10   DG Chest Port 1 View  Result Date: 12/30/2019 CLINICAL DATA:  Intubation. EXAM: PORTABLE CHEST 1 VIEW COMPARISON:  Radiograph 11/08/2019. FINDINGS: Low positioning of  the endotracheal tube 6 mm from the carina. Enteric tube in place with tip below the diaphragm not included in this chest field of view. Stable cardiomegaly. Unchanged mediastinal contours. Interstitial coarsening which appears chronic. No focal airspace disease, pleural effusion, or pneumothorax. No acute osseous abnormalities are seen.  IMPRESSION: 1. Low positioning of the endotracheal tube 6 mm from the carina. Retraction of 2-3 cm recommended. 2. Enteric tube in place with tip below the diaphragm. 3. Stable cardiomegaly and chronic interstitial coarsening. Electronically Signed   By: Keith Rake M.D.   On: 12/30/2019 01:11   DG Abd Portable 1V  Result Date: 12/30/2019 CLINICAL DATA:  OG tube placement. EXAM: PORTABLE ABDOMEN - 1 VIEW COMPARISON:  Abdomen pelvis CT yesterday. FINDINGS: Tip and side port of the enteric tube below the diaphragm in the stomach. Excreted IV contrast within both renal collecting systems and urinary bladder. Bilateral ureteral stents in place. Nephrograms demonstrate mild hydronephrosis. Nonobstructive bowel gas pattern. IMPRESSION: 1. Tip and side port of the enteric tube below the diaphragm in the stomach. 2. Bilateral ureteral stents in place. Excreted IV contrast in both renal collecting systems in the urinary bladder with mild bilateral hydronephrosis. Electronically Signed   By: Keith Rake M.D.   On: 12/30/2019 01:13   DG C-Arm 1-60 Min-No Report  Result Date: 12/25/2019 Fluoroscopy was utilized by the requesting physician.  No radiographic interpretation.   DG C-Arm 1-60 Min-No Report  Result Date: 12/06/2019 Fluoroscopy was utilized by the requesting physician.  No radiographic interpretation.   CT HEAD CODE STROKE WO CONTRAST  Result Date: 12/29/2019 CLINICAL DATA:  Code stroke.  Left-sided weakness EXAM: CT HEAD WITHOUT CONTRAST TECHNIQUE: Contiguous axial images were obtained from the base of the skull through the vertex without intravenous contrast. COMPARISON:  None. FINDINGS: Brain: There is no acute intracranial hemorrhage or mass effect. Gray-white differentiation is preserved. Ventricles and sulci are within normal limits in size and configuration. Patchy hypoattenuation in the supratentorial white matter is nonspecific but probably reflects chronic microvascular ischemic  changes. Extra-axial dural-based hyperdense lesion along the high left frontal convexity and falx measuring 2.6 x 1.8 x 2.3 cm is consistent with a meningioma. There is no apparent underlying edema. This abuts the superior sagittal sinus. Vascular: No hyperdense vessel. Mild intracranial atherosclerotic calcification at the skull base. Skull: Unremarkable. Sinuses/Orbits: No acute abnormality. Other: Mastoid air cells are clear. ASPECTS (Gregory Stroke Program Early CT Score) - Ganglionic level infarction (caudate, lentiform nuclei, internal capsule, insula, M1-M3 cortex): 7 - Supraganglionic infarction (M4-M6 cortex): 3 Total score (0-10 with 10 being normal): 10 IMPRESSION: No acute intracranial hemorrhage or evidence of acute infarction. ASPECT score is 10. Chronic microvascular ischemic changes. Left frontal meningioma without significant mass effect or underlying edema. Initial results were communicated to Dr. Lorraine Lax at [place] 6/27/2021by text page via the [facility] messaging system. Electronically Signed   By: Macy Mis M.D.   On: 12/29/2019 18:20    PHYSICAL EXAM  Temp:  [97.4 F (36.3 C)-98.1 F (36.7 C)] 98.1 F (36.7 C) (06/28 0800) Pulse Rate:  [46-82] 59 (06/28 0900) Resp:  [12-35] 19 (06/28 0900) BP: (99-154)/(47-112) 124/112 (06/28 0800) SpO2:  [92 %-100 %] 100 % (06/28 0900) Arterial Line BP: (97-154)/(39-76) 134/62 (06/28 0900) FiO2 (%):  [30 %-60 %] 30 % (06/28 0800) Weight:  [95.7 kg] 95.7 kg (06/28 0234)  General - Well nourished, well developed, intubated on sedation.  Ophthalmologic - fundi not visualized due to noncooperation.  Cardiovascular -  irregularly irregular heart rate and rhythm.  Neuro - intubated on propofol, eyes closed, not following commands. With forced eye opening, eyes in mid position, possible intermittent blinking to visual threat on the right but not on the left, doll's eyes sluggish, not tracking, PERRL. Corneal reflex present b/l, gag and cough  present. Breathing over the vent.  Facial symmetry not able to test due to ET tube.  Tongue protrusion not cooperative. Purposeful movement of RUE. On pain stimulation, localizing to pain at RUE, withdraw 2/5 RLE, mild/slight withdraw LUE and LLE. DTR 1+ and left positive babinski. Sensation, coordination and gait not tested.  ASSESSMENT/PLAN Gloria [name] is a 74 y.o. female with history of bilateral ureteral calculi, HTN, Atrial Fibrillation not on anticoagulation( not on [facility] due to high risk of falls), who presents with left sided hemiplegia and right gaze preference. CTH in the ED revealed left frontal meningioma, CTA with a right M1 occlusion. She received IVTPA on 12/29/19 at 1830 w/LKW 1645 and was taken for mechanical thrombectomy with resulting TICI 2B revascularization w/placement of recue stent in the right MCA.  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 Small vessel disease. Atrophy. ASPECTS 10.     CTA head & neck R M1 occlusion  Cerebral angio R M1 occlusion w/p TICI 2b revascularization. 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    Post IR CT hyperdensity R subarachnoid space, stable L parafalcine meningioma  MRI  pending  MRA  pending  LE Doppler  No DVT  2D Echo pending  LDL 90  HgbA1c 5.2  SCDs for VTE prophylaxis  aspirin 325 mg daily prior to admission, now on aspirin 81 mg daily and Brilinta (ticagrelor) 90 mg bid given stent placement. Plant to continue Brilinta at d/c with [facility] if can tolerate at that time, o/w continue DAPT alone  Therapy recommendations:  pending   Disposition:  pending   Acute Respiratory Failure  intubated for IR, left intubated for airway protection  Sedated on propofol  CCM on board  Plan to extubate soon  Hematoma R groin  pseudoanerysm w/ extravasation  following IR  CT abd & pelvis R inguinal hematoma into abdominal wall. Stable B ureteral stents and B adrenal adenoma. Aortic atherosclerosis.    Hgb 9.2->8.8->8.4  [place] right groin - no pseudoaneurysm  Hematuria  S/p B ureteral stents exchange 6/4 d/t B renal calculi  ABD CT Mild B hydronephrosis, stents in place  Hgb 9.2->8.8->8.4  Cre 1.9->1.5->1.44  Urology consulted, appreciate help  Atrial Fibrillation  Home anticoagulation:  none d/t high fall risk  Not stable for AC at this time  Plan with AC at d/c   Hypertension  Home meds:  Coreg 6.25 bid  Stable  BP goal 120-140 x 24h following IR procedure and tPA administration.   Long-term BP goal normotensive  Hyperlipidemia  Home meds:  No statin  LDL 90, goal < 70  lipitor 40 now  Continue statin at discharge  Dysphagia  Secondary to stroke  NPO  Speech on board  Will place cortrak   Other Stroke Risk Factors  Advanced age  Obesity, Body mass index is 38.59 kg/m., recommend weight loss, diet and exercise as appropriate   Other Active Problems  urinary incontinence on mirabegron PTA  [facility]  Hospital day # 1  This patient is critically ill due to right MCA stroke, right MCA occlusion, status post TPA and thrombectomy, intracranial stent, hematuria A. fib, respirate failure and at significant risk of neurological worsening, death form recurrent stroke, hemorrhagic conversion, seizure, heart failure, hypotension. This patient's care requires constant monitoring of vital signs, hemodynamics, respiratory and cardiac monitoring, review of multiple databases, neurological assessment, discussion with family, other specialists and medical decision making of high complexity. I spent 40 minutes of neurocritical care time in the care of this patient.   Rosalin Hawking, MD PhD Stroke Neurology 12/30/2019 9:43 AM    To contact Stroke Continuity provider, please refer to [email or web address] After hours,  contact General Neurology

## 2019-12-30 NOTE — Anesthesia Postprocedure Evaluation (Signed)
Anesthesia Post Note  Patient: Gloria [name]  Procedure(s) Performed: IR WITH ANESTHESIA (N/A )     Patient location during evaluation: SICU Anesthesia Type: General Level of consciousness: sedated Pain management: pain level controlled Vital Signs Assessment: post-procedure vital signs reviewed and stable Respiratory status: patient remains intubated per anesthesia plan Cardiovascular status: stable Postop Assessment: no apparent nausea or vomiting Anesthetic complications: no   No complications documented.  Last Vitals:  Vitals:   12/30/19 0215 12/30/19 0230  BP:    Pulse: (!) 53 (!) 57  Resp: 15 15  Temp:    SpO2: 100% 100%    Last Pain:  Vitals:   12/30/19 0013  TempSrc: Oral                 Tiajuana Amass

## 2019-12-30 NOTE — Sedation Documentation (Signed)
Pt with expanding hematoma of RIGHT groin. Dr. Estanislado Pandy called to beside to evaluate. STAT CT ABD/Pelvis ordered to evaluate.

## 2019-12-30 NOTE — Progress Notes (Signed)
SLP Cancellation Note  Patient Details Name: Gloria [name] MRN: [id number] DOB: 28-Mar-1946   Cancelled treatment:       Reason Eval/Treat Not Completed: Medical issues which prohibited therapy (pt intubated, sedated). Will f/u as ready.   Osie Bond., M.A. [place] [facility] Services Pager [phone] Office [phone]  12/30/2019, 7:58 AM

## 2019-12-30 NOTE — Progress Notes (Signed)
Right pseudo check has been completed.   Preliminary results in CV Proc.   Abram Sander 12/30/2019 11:06 AM

## 2019-12-30 NOTE — Transfer of Care (Addendum)
Immediate Anesthesia Transfer of Care Note  Patient: Gloria [name]  Procedure(s) Performed: IR WITH ANESTHESIA (N/A )  Patient Location: ICU  Anesthesia Type:General  Level of Consciousness: sedated and Patient remains intubated per anesthesia plan  Airway & Oxygen Therapy: Patient remains intubated per anesthesia plan and Patient placed on Ventilator (see vital sign flow sheet for setting)  Post-op Assessment: Report given to RN and Post -op Vital signs reviewed and stable.  Moving right side.  Post vital signs: Reviewed and stable  Last Vitals:  Vitals Value Taken Time  BP 122/104 12/30/19 0023  Temp 36.3 C 12/30/19 0013  Pulse 58 12/30/19 0026  Resp 20 12/30/19 0026  SpO2 97 % 12/30/19 0026  Vitals shown include unvalidated device data.  Last Pain:  Vitals:   12/30/19 0013  TempSrc: Oral         Complications: No complications documented.

## 2019-12-30 NOTE — Sedation Documentation (Signed)
Pt taken to CT to evaluate RIGHT groin hematoma and r/o RP bleed. Constant manual pressure being applied by Debby Freiberg, RTR.

## 2019-12-30 NOTE — Procedures (Signed)
Cortrak  Person Inserting Tube:  Amelia Macken, RD Tube Type:  Cortrak - 43 inches Tube Location:  Left nare Initial Placement:  Stomach Secured by: Bridle Technique Used to Measure Tube Placement:  Documented cm marking at nare/ corner of mouth Cortrak Secured At:  69 cm   No x-ray is required. RN may begin using tube.   If the tube becomes dislodged please keep the tube and contact the Cortrak team at [email or web address] ([email or web address]) for replacement.  If after hours and replacement cannot be delayed, place a NG tube and confirm placement with an abdominal x-ray.   Enis Riecke RD, LDN Clinical Nutrition Pager listed in AMION    

## 2019-12-30 NOTE — Progress Notes (Signed)
  Echocardiogram 2D Echocardiogram has been performed.  Johny Chess 12/30/2019, 2:12 PM

## 2019-12-30 NOTE — Progress Notes (Signed)
OT Cancellation Note  Patient Details Name: SELDA JALBERT MRN: [id number] DOB: Jul 19, 1945   Cancelled Treatment:    Reason Eval/Treat Not Completed: Patient not medically ready. Pt intubated.  Checked in with RN re: potential for extubation today and he said there is a possibility as she is weaning on low vent settings.  He reports that it is complicated by her being deaf.  He recommended checking back later.  She has only been here 16 hours and had IR and tPA, so not at our 24 hour threshold yet either.  OT to check back later today or tomorrow to check readiness for assessment. She seems to be spontaneously move her right side.  [place], OTR/L Acute Rehab Services Pager [phone] Office [phone]     Almon Register 12/30/2019, 1:59 PM

## 2019-12-30 NOTE — Sedation Documentation (Signed)
RIGHT groin soft, ecchymotic with gauze/tegaderm bandage CDI. Border of hematoma outline with sharpie. Pt taken to 4N29 for admission.

## 2019-12-31 ENCOUNTER — Inpatient Hospital Stay (HOSPITAL_COMMUNITY): Payer: PPO

## 2019-12-31 DIAGNOSIS — I63431 Cerebral infarction due to embolism of right posterior cerebral artery: Secondary | ICD-10-CM

## 2019-12-31 LAB — BASIC METABOLIC PANEL
Anion gap: 10 (ref 5–15)
BUN: 33 mg/dL — ABNORMAL HIGH (ref 8–23)
CO2: 19 mmol/L — ABNORMAL LOW (ref 22–32)
Calcium: 8 mg/dL — ABNORMAL LOW (ref 8.9–10.3)
Chloride: 112 mmol/L — ABNORMAL HIGH (ref 98–111)
Creatinine, Ser: 1.88 mg/dL — ABNORMAL HIGH (ref 0.44–1.00)
GFR calc Af Amer: 30 mL/min — ABNORMAL LOW (ref >60)
GFR calc non Af Amer: 26 mL/min — ABNORMAL LOW (ref >60)
Glucose, Bld: 103 mg/dL — ABNORMAL HIGH (ref 70–99)
Potassium: 4.7 mmol/L (ref 3.5–5.1)
Sodium: 141 mmol/L (ref 135–145)

## 2019-12-31 LAB — CBC
HCT: 24.7 % — ABNORMAL LOW (ref 36.0–46.0)
Hemoglobin: 7.8 g/dL — ABNORMAL LOW (ref 12.0–15.0)
MCH: 32.8 pg (ref 26.0–34.0)
MCHC: 31.6 g/dL (ref 30.0–36.0)
MCV: 103.8 fL — ABNORMAL HIGH (ref 80.0–100.0)
Platelets: 223 10*3/uL (ref 150–400)
RBC: 2.38 MIL/uL — ABNORMAL LOW (ref 3.87–5.11)
RDW: 14.4 % (ref 11.5–15.5)
WBC: 10.4 10*3/uL (ref 4.0–10.5)
nRBC: 0 % (ref 0.0–0.2)

## 2019-12-31 LAB — GLUCOSE, CAPILLARY
Glucose-Capillary: 102 mg/dL — ABNORMAL HIGH (ref 70–99)
Glucose-Capillary: 102 mg/dL — ABNORMAL HIGH (ref 70–99)
Glucose-Capillary: 119 mg/dL — ABNORMAL HIGH (ref 70–99)
Glucose-Capillary: 120 mg/dL — ABNORMAL HIGH (ref 70–99)
Glucose-Capillary: 92 mg/dL (ref 70–99)
Glucose-Capillary: 98 mg/dL (ref 70–99)

## 2019-12-31 LAB — MAGNESIUM
Magnesium: 1.9 mg/dL (ref 1.7–2.4)
Magnesium: 1.8 mg/dL (ref 1.7–2.4)
Magnesium: 1.8 mg/dL (ref 1.7–2.4)

## 2019-12-31 LAB — TRIGLYCERIDES: Triglycerides: 138 mg/dL (ref <150)

## 2019-12-31 LAB — PHOSPHORUS
Phosphorus: 4 mg/dL (ref 2.5–4.6)
Phosphorus: 3.5 mg/dL (ref 2.5–4.6)

## 2019-12-31 MED ORDER — CHLORHEXIDINE GLUCONATE 0.12 % MT SOLN
15.0000 mL | Freq: Two times a day (BID) | OROMUCOSAL | Status: DC
  Administered 2019-12-31 – 2020-01-09 (×18): 15 mL via OROMUCOSAL
  Filled 2019-12-31 (×16): qty 15

## 2019-12-31 MED ORDER — PANTOPRAZOLE SODIUM 40 MG PO PACK
40.0000 mg | PACK | Freq: Every day | ORAL | Status: DC
  Administered 2019-12-31 – 2020-01-06 (×7): 40 mg
  Filled 2019-12-31 (×7): qty 20

## 2019-12-31 MED ORDER — ORAL CARE MOUTH RINSE
15.0000 mL | Freq: Two times a day (BID) | OROMUCOSAL | Status: DC
  Administered 2019-12-31 – 2020-01-05 (×11): 15 mL via OROMUCOSAL

## 2019-12-31 MED ORDER — VITAL HIGH PROTEIN PO LIQD: 1000.0000 mL | ORAL | Status: DC

## 2019-12-31 MED ORDER — LACTATED RINGERS IV BOLUS
500.0000 mL | Freq: Once | INTRAVENOUS | Status: AC
  Administered 2019-12-31: 500 mL via INTRAVENOUS

## 2019-12-31 MED ORDER — POLYETHYLENE GLYCOL 3350 17 G PO PACK
17.0000 g | PACK | Freq: Every day | ORAL | Status: DC
  Filled 2019-12-31: qty 1

## 2019-12-31 MED ORDER — PRO-STAT SUGAR FREE PO LIQD
30.0000 mL | Freq: Two times a day (BID) | ORAL | Status: DC
  Administered 2019-12-31 – 2020-01-03 (×7): 30 mL
  Filled 2019-12-31 (×7): qty 30

## 2019-12-31 MED ORDER — JEVITY 1.2 CAL PO LIQD
1000.0000 mL | ORAL | Status: DC
  Administered 2019-12-31 – 2020-01-03 (×3): 1000 mL
  Filled 2019-12-31 (×5): qty 1000

## 2019-12-31 NOTE — Progress Notes (Signed)
NAME:  Gloria [name], MRN:  [id number], DOB:  02-01-1946, LOS: 2 ADMISSION DATE:  12/29/2019, CONSULTATION DATE:  12/29/2019 REFERRING MD:  Neurology, CHIEF COMPLAINT:  Left sided weakness  Brief History   74 year old woman with history of hypertension, A. fib not on anticoagulation presenting with left-sided weakness and right gaze preference. 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 Now s/p tPA, revascularization of right MCA M1 segment as well as placement of rescue stent in the right MCA.  Postprocedure has hematoma within the right inguinal region extending along the inguinal crease into the pannus of the right lower quadrant abdominal wall.  No extension into the retroperitoneal space.  History of present illness   74 year old woman with history of hypertension, A. fib not on anticoagulation presenting with left-sided weakness and right gaze preference. History obtained from EMR and nursing due to patient intubated/sedated.  Per EMR she was last known normal at [place].  She was found by her brother when he returned from the store lying on the ground.  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  A code stroke was called.  CT head was negative for hemorrhage.  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 She was given tPA. CTA showed right M1 occlusion.  She underwent revascularization of right MCA M1 segment as well as placement of rescue stent in the right MCA.  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  She was sent for CT abdomen pelvis without contrast.  There is a hematoma within the right inguinal region extending along the inguinal crease into the pannus of the right lower quadrant abdominal wall.  No extension into the retroperitoneal space.  Past Medical History  hypertension, A. fib not on anticoagulation (due to high risk falls)  Hearing aid for left aid and needs glasses for lip riding primarily, does not sign Chronic right knee pain  Significant Hospital Events     6/27> admit, tPA, revascularization of right M1 occlusion as well as stent placement  Consults:  PCCM 6/27  Procedures:  ETT 6/27> Left [place] 6/27 >  Significant Diagnostic Tests:  CT abd/pelvis w/o contrast 6/27>>Hematoma within the right inguinal region extending along the inguinal crease into the pannus of the right lower quadrant abdominal wall. No extension into the retroperitoneal space.Bilateral ureteral stents. Stable bilateral adrenal adenoma.  CT head w/o contrast 6/27>>Hyperdense material in the right subarachnoid space, overlying the frontal operculum and within the Sylvian fissure. Unchanged left parafalcine meningioma.  CTA head/neck 6/27>>Occlusion of the right M1 MCA. However, there is good filling of the more distal MCA territory. No hemodynamically significant stenosis in the neck.  Micro Data:  COVID19 6/27>> neg  Antimicrobials:  None  Interim history/subjective:  No acute events overnight. She remains on minimal propofol.  Objective   Blood pressure 137/60, pulse 74, temperature 99.2 F (37.3 C), temperature source Axillary, resp. rate 19, weight 96.7 kg, SpO2 100 %.    Vent Mode: PSV;CPAP FiO2 (%):  [30 %-40 %] 30 % Set Rate:  [15 bmp] 15 bmp Vt Set:  [400 mL] 400 mL PEEP:  [5 cmH20] 5 cmH20 Pressure Support:  [5 cmH20] 5 cmH20 Plateau Pressure:  [13 cmH20-14 cmH20] 14 cmH20   Intake/Output Summary (Last 24 hours) at 12/31/2019 1159 Last data filed at 12/31/2019 0900 Gross per 24 hour  Intake 2182.37 ml  Output 825 ml  Net 1357.37 ml   Filed Weights   12/29/19 1800 12/30/19 0234 12/31/19 0355  Weight: 95.7 kg  95.7 kg 96.7 kg   Examination: General:  Elderly female in NAD on mechanical ventilation HEENT: MM pink/moist, ETT, OGT, pupils 3/reactive, some ecchymosis over right eyelid Neuro: Opens eyes spontaneously. Right gaze deviation. Moves RUE and RLE spontaneously. Moves LLE spontaneously. Strength 0/5 in LUE. Follows simple commands but  perseverates and cannot follow subsequent commands. Exam partially limited due to impaired hearing.  CV: Irregularly irregular rhythm, 2/6 holosystolic murmur loudest at apex, stable R groin site PULM:  Non labored, doing well on PSV 5/5, lungs clear  GI: obese, soft, bs active, purwick Extremities: warm/dry, no LE edema  Skin: no rashes   Assessment & Plan:  74 year old woman with history of hypertension, A. fib not on anticoagulation presenting with left-sided weakness and right gaze preference. 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 Now s/p tPA, revascularization of right MCA M1 segment as well as placement of rescue stent in the right MCA.  Postprocedure has hematoma within the right inguinal region extending along the inguinal crease into the pannus of the right lower quadrant abdominal wall.  No extension into the retroperitoneal space.  Today patient is able to follow simple commands. She is intubated and appears comfortable. When bed was put into the chair position, patient was able to maintain her posture and support her head without assistance of pillows. Given this, we will attempt to extubate today.  MRI brain revealing of acute infarct of of right occipital cortex and acute infarction of right insular region. Small scattered acute infarctions noted in right frontal and parietal regions. 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 TEE was notable for marked LA enlargement, a hyperdynamic LV (EF ~75%), mild-moderate TR, and mild-moderate AR. Doppler ultrasonography of RLE showed no evidence of femoral artery pseudoaneurysm.  Acute respiratory insufficiency: Intubated for airway protection - attempt extubation this afternoon - wean O2 for goal O2 sat >89% - to remain NPO, will need SLP post extubation - minimize sedation  MCA M1 occlusion, CVA:  -- telemetry monitoring in ICU -- continue Neosynephrine for BP goal per Neuro IR SBP 120-140 -- brillinta/ ASA per  NIR -- PT/ OT/ SLP when appropriate  -- continue frequent neuro checks  -- Frequent neuro checks  Post procedure hematoma: distal pulses remain intact, site unchanged and H/H levels stable  History of atrial fibrillation, HTN: CHA2DS2-VASc score of at least 5 - previously not on AC due to high risk of falls - Will need to be started on anticoagulation eventually, can discuss benefits with family  HOH - brother states she does not use sign language, requires her left hearing aid and glasses and primarily read lips  Hematuria with ureteral stent exchange 6/23 2/2 bilateral renal stones - UOP remains adequate with stable renal function - urology consulted by primary team, as chart review patient planned for stent removal in 1 week from 6/23  Best practice:  Diet: NPO Pain/Anxiety/Delirium protocol (if indicated): Propofol gtt, fent prn VAP protocol (if indicated): Ordered DVT prophylaxis: SCDs GI prophylaxis: PPI Glucose control: Monitor Mobility: PT/OT when able Code Status: FULL Family Communication: brother, [name] updated by phone.  Disposition:  Neuro ICU  Labs   CBC: Recent Labs  Lab 12/29/19 1802 12/29/19 1802 12/29/19 1846 12/29/19 2300 12/30/19 0134 12/30/19 0500 12/31/19 0342  WBC 5.8  --   --   --   --  7.8 10.4  NEUTROABS 3.9  --   --   --   --  7.3  --   HGB 11.8*   < >  11.6* 9.2* 8.8* 8.4* 7.8*  HCT 38.3   < > 34.0* 27.0* 26.0* 26.6* 24.7*  MCV 104.4*  --   --   --   --  103.1* 103.8*  PLT 225  --   --   --   --  200 223   < > = values in this interval not displayed.    Basic Metabolic Panel: Recent Labs  Lab 12/29/19 1802 12/29/19 1802 12/29/19 1846 12/29/19 2300 12/30/19 0134 12/30/19 0500 12/31/19 0342  NA 141   < > 140 139 141 141 141  K 5.3*   < > 5.2* 4.4 4.3 4.5 4.7  CL 106  --  106 108  --  113* 112*  CO2 24  --   --   --   --  22 19*  GLUCOSE 109*  --  103* 134*  --  149* 103*  BUN 32*  --  34* 26*  --  28* 33*  CREATININE 1.76*   --  1.90* 1.50*  --  1.44* 1.88*  CALCIUM 8.9  --   --   --   --  7.9* 8.0*  MG  --   --   --   --   --   --  1.9   < > = values in this interval not displayed.   GFR: Estimated Creatinine Clearance: 28.5 mL/min (A) (by C-G formula based on SCr of 1.88 mg/dL (H)). Recent Labs  Lab 12/29/19 1802 12/30/19 0500 12/31/19 0342  WBC 5.8 7.8 10.4    Liver Function Tests: Recent Labs  Lab 12/29/19 1802  AST 17  ALT 11  ALKPHOS 82  BILITOT 0.6  PROT 6.2*  ALBUMIN 3.2*   ABG    Component Value Date/Time   PHART 7.354 12/30/2019 0134   PCO2ART 39.7 12/30/2019 0134   PO2ART 242 (H) 12/30/2019 0134   HCO3 22.3 12/30/2019 0134   TCO2 24 12/30/2019 0134   ACIDBASEDEF 3.0 (H) 12/30/2019 0134   O2SAT 100.0 12/30/2019 0134     Coagulation Profile: Recent Labs  Lab 12/29/19 1802  INR 1.1   CBG: Recent Labs  Lab 12/29/19 1755 12/30/19 2331 12/31/19 0343 12/31/19 0807 12/31/19 [place], MS4   Critical care attending attestation note:  Patient seen and examined and relevant ancillary tests reviewed.  I agree with the assessment and plan of care as outlined by Riccardo Dubin, MS IV.    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  Status post revascularization complicated by hematoma formation.  She remains critically ill due to acute respiratory failure requiring mechanical ventilation.  On examination today she wakes to voice will follow some visual commands (she is deaf).  Chest is clear.  She is tolerating SBT.  Heart sounds are irregularly irregular.  Abdomen is soft and nontender.  There is a minimal ecchymosis at the puncture site.  She is moving the right side spontaneously weak on the left  Active issues   Acute respiratory failure CVA status post revascularization Abdominal wall hematoma Atrial  fibrillation  Plan  Trial of extubation today. Secondary stroke prevention. Follow hematoma and hemoglobin.  CRITICAL CARE Performed by: Kipp Brood   Total critical care time: 35 minutes  Critical care time was exclusive of separately billable procedures and treating other patients.  Critical care was necessary to treat or prevent  imminent or life-threatening deterioration.  Critical care was time spent personally by me on the following activities: development of treatment plan with patient and/or surrogate as well as nursing, discussions with consultants, evaluation of patient's response to treatment, examination of patient, obtaining history from patient or surrogate, ordering and performing treatments and interventions, ordering and review of laboratory studies, ordering and review of radiographic studies, pulse oximetry, re-evaluation of patient's condition and participation in multidisciplinary rounds.  Kipp Brood, MD [facility] ICU Physician [place]  Pager: [phone] Mobile: [phone] After hours: [phone].  01/01/2020, 11:33 AM

## 2019-12-31 NOTE — Evaluation (Signed)
Physical Therapy Evaluation Patient Details Name: Gloria [name] MRN: [id number] DOB: Jun 14, 1946 Today's Date: 12/31/2019   History of Present Illness  This 74 y.o. female admitted with flaccid Lt side and Rt gaze preference.  Pt received tPA.  CTA revealed Rt M1 occlusion and she was taken for emergent thrombectomy.  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  She was extubated 12/31/2019. PMH:  Pt is very HOH - reads lips, HTN, h/o kidney stones, A-Fib; s/p multiple cystocopies with stent placement.    Clinical Impression  Pt with significant impairments in balance, left sided strength and cognition.  She has a severe L neglect and significant R gaze preference that is complicated by being significantly HOH.  Communication pad and pen/paper used fairly successfully today in her far right visual field (and I even wrote on the far right side of the paper).  Pt will need significant multi disciplinary post acute rehab.   PT to follow acutely for deficits listed below.      Follow Up Recommendations CIR    Equipment Recommendations  Wheelchair (measurements PT);Wheelchair cushion (measurements PT);Hospital bed    Recommendations for Other Services Rehab consult     Precautions / Restrictions Precautions Precautions: Fall;Other (comment) Precaution Comments: Rt gaze preference with Lt neglect, essentially deaf Restrictions Weight Bearing Restrictions: No      Mobility  Bed Mobility Overal bed mobility: Needs Assistance             General bed mobility comments: total assist from bed to position in fully upright chiar mode.  worked from there on engaging her cognitively finally having some minimal success with large print in far R gaze, attempting to at least get gaze to midline/track, attempting some automatic ADLs, she did at the end of  the session take a pen from me and start writing on a piece of paper, but most of it was illegible.    Transfers                    Ambulation/Gait                Stairs            Wheelchair Mobility    Modified Rankin (Stroke Patients Only) Modified Rankin (Stroke Patients Only) Pre-Morbid Rankin Score: Slight disability Modified Rankin: Severe disability     Balance                                             Pertinent Vitals/Pain Pain Assessment: Faces Faces Pain Scale: No hurt    Home Living Family/patient expects to be discharged to:: Private residence Living Arrangements: Alone Available Help at Discharge: Family;Available PRN/intermittently Type of Home: House       Home Layout: One level Home Equipment: Cane - quad;Cane - single point;Bedside commode;Walker - 2 wheels Additional Comments: Pt unable to provide info. therefore info gleaned from chart review     Prior Function Level of Independence: Independent with assistive device(s)         Comments: walks with a cane, sponge bathes only     Hand Dominance   Dominant Hand: Right    Extremity/Trunk Assessment   Upper Extremity Assessment Upper Extremity Assessment: Defer  to OT evaluation    Lower Extremity Assessment Lower Extremity Assessment: RLE deficits/detail;LLE deficits/detail RLE Deficits / Details: R LE and UE moving spontaneously, edematous, but not pitting, moves the most to noxious stimuli LLE Deficits / Details: Left leg with incr    Cervical / Trunk Assessment Cervical / Trunk Assessment: Other exceptions Cervical / Trunk Exceptions: Rt gaze preference with head/neck turned to the Rt, resists coming to midline and unable to get left.   Communication   Communication: HOH;Other (comment) (nearly deaf, reads lips)  Cognition Arousal/Alertness: Awake/alert;Lethargic Behavior During Therapy: Flat affect Overall Cognitive Status: Difficult to  assess Area of Impairment: Attention;Safety/judgement;Awareness                   Current Attention Level: Sustained;Focused     Safety/Judgement: Decreased awareness of safety;Decreased awareness of deficits Awareness: Intellectual   General Comments: Pt severely HOH and demonstrates severe Lt neglect with profound Rt gaze preference.  She was initially very lethargic with minimal eye opening, however, when she was moved into partial chair position in the bed, her arousal improved.  Once she is able to visually locate person, and able to focus on them, she was able to read short bits of info if it was placed on her far right, and she was able to respond semi appropriately to the written info, however, she didn't follow any commands as she had a reason why not to perform all tasks asked of her.       General Comments      Exercises General Exercises - Lower Extremity Ankle Circles/Pumps: AAROM;PROM;Right;Left;5 reps Heel Slides: AROM;PROM;Right;Left;5 reps   Assessment/Plan    PT Assessment Patient needs continued PT services  PT Problem List Decreased strength;Decreased range of motion;Decreased activity tolerance;Decreased balance;Decreased mobility;Decreased coordination;Decreased cognition;Decreased knowledge of use of DME;Decreased safety awareness;Decreased knowledge of precautions;Impaired sensation;Impaired tone;Obesity       PT Treatment Interventions DME instruction;Functional mobility training;Therapeutic activities;Therapeutic exercise;Balance training;Gait training;Stair training;Neuromuscular re-education;Cognitive remediation;Patient/family education;Wheelchair mobility training    PT Goals (Current goals can be found in the Care Plan section)  Acute Rehab PT Goals Patient Stated Goal: unable to state PT Goal Formulation: Patient unable to participate in goal setting Time For Goal Achievement: 01/14/20 Potential to Achieve Goals: Fair    Frequency Min  4X/week   Barriers to discharge        Co-evaluation PT/OT/SLP Co-Evaluation/Treatment: Yes Reason for Co-Treatment: Complexity of the patient's impairments (multi-system involvement);Necessary to address cognition/behavior during functional activity PT goals addressed during session: Mobility/safety with mobility;Strengthening/ROM;Other (comment) (cognition/communication)         AM-PAC PT "6 Clicks" Mobility  Outcome Measure Help needed turning from your back to your side while in a flat bed without using bedrails?: Total Help needed moving from lying on your back to sitting on the side of a flat bed without using bedrails?: Total Help needed moving to and from a bed to a chair (including a wheelchair)?: Total Help needed standing up from a chair using your arms (e.g., wheelchair or bedside chair)?: Total Help needed to walk in hospital room?: Total Help needed climbing 3-5 steps with a railing? : Total 6 Click Score: 6    End of Session   Activity Tolerance:  (limited by cognition, hearing and R gaze) Patient left: in bed Nurse Communication: Mobility status;Other (comment) (pt in high chair mode) PT Visit Diagnosis: Muscle weakness (generalized) (M62.81);Difficulty in walking, not elsewhere classified (R26.2);Hemiplegia and hemiparesis Hemiplegia - Right/Left: Left Hemiplegia - dominant/non-dominant: Non-dominant  Hemiplegia - caused by: Cerebral infarction    Time: 1583-0940 PT Time Calculation (min) (ACUTE ONLY): 46 min   Charges:   PT Evaluation $PT Eval Moderate Complexity: [place], PT, DPT  Acute Rehabilitation [phone] pager [phone]) [phone] office

## 2019-12-31 NOTE — Progress Notes (Signed)
Initial Nutrition Assessment  DOCUMENTATION CODES:   Obesity unspecified  INTERVENTION:   Initiate tube feeding via Cortrak tube; gastric: Jevity 1.2 at 50 ml/h (1200 ml per day) Pro-stat 30 ml BID  Provides 1640 kcal, 96 gm protein, 973 ml free water daily   NUTRITION DIAGNOSIS:   Inadequate oral intake related to inability to eat as evidenced by NPO status.  GOAL:   Patient will meet greater than or equal to 90% of their needs  MONITOR:   Diet advancement, TF tolerance  REASON FOR ASSESSMENT:   Consult Enteral/tube feeding initiation and management  ASSESSMENT:   Pt with PMH Of bilateral ureteral calculi, HTN, Afib (not on anticoagulation due to high fall risk), and deafness (does not use sign language she reads lips) admitted with R M1 occlusion s/p tPA  And mechanical thrombectomy with R MCA stenting.   Pt discussed during ICU rounds and with RN.  Per RN report pt lives next to brother. She is deaf and does not use sign language. She reads lips but since COVID has struggled.   6/29 extubated 6/28 cortrak placed; tip gastric   Medications reviewed and include: colace, miralax  Labs reviewed    NUTRITION - FOCUSED PHYSICAL EXAM:    Most Recent Value  Orbital Region No depletion  Upper Arm Region No depletion  Thoracic and Lumbar Region No depletion  Buccal Region No depletion  Temple Region No depletion  Clavicle Bone Region No depletion  Clavicle and Acromion Bone Region No depletion  Scapular Bone Region No depletion  Dorsal Hand No depletion  Patellar Region No depletion  Anterior Thigh Region No depletion  Posterior Calf Region No depletion  Edema (RD Assessment) None  Hair Reviewed  Eyes Reviewed  Mouth Unable to assess  Skin Reviewed  Nails Reviewed       Diet Order:   Diet Order            Diet NPO time specified  Diet effective now                 EDUCATION NEEDS:   No education needs have been identified at this  time  Skin:  Skin Assessment: Reviewed RN Assessment  Last BM:  unknown  Height:   Ht Readings from Last 1 Encounters:  12/23/19 5\' 2"  (1.575 m)    Weight:   Wt Readings from Last 1 Encounters:  12/31/19 96.7 kg    Ideal Body Weight:  50 kg  BMI:  Body mass index is 38.99 kg/m.  Estimated Nutritional Needs:   Kcal:  1600-1800  Protein:  80-100 grams  Fluid:  >1.6 L/day  Lockie Pares., RD, LDN, CNSC See AMiON for contact information

## 2019-12-31 NOTE — Progress Notes (Signed)
STROKE TEAM PROGRESS NOTE   SUBJECTIVE (INTERVAL HISTORY) Pt RN and Dr. Lynetta Mare are at bedside. Pt sleepy but easily arousable, eyes open, right forced gaze and left hemiplegia. Put her on sitting position in bed, she was able to hold her head up. On weaning, will try to extubate. Called pt brother and updated him. As per brother, who is the POA, pt not using sign language, basically reading lips but lives alone with brother lives next door. However, since COVID, with face mask, she had difficulty to read lips, so brother has been using cell phone apps and clip board for pt to write and communicate.    OBJECTIVE Temp:  [97.9 F (36.6 C)-99.2 F (37.3 C)] 99.2 F (37.3 C) (06/29 0800) Pulse Rate:  [53-81] 80 (06/29 0800) Cardiac Rhythm: Atrial fibrillation (06/29 0800) Resp:  [14-23] 18 (06/29 0800) BP: (60-152)/(28-96) 133/65 (06/29 0800) SpO2:  [100 %] 100 % (06/29 0800) Arterial Line BP: (93-140)/(51-125) 101/96 (06/29 0700) FiO2 (%):  [30 %-40 %] 30 % (06/29 0742) Weight:  [96.7 kg] 96.7 kg (06/29 0355)  Recent Labs  Lab 12/29/19 1755 12/30/19 2331 12/31/19 0343 12/31/19 0807  GLUCAP 106* 101* 102* 98   Recent Labs  Lab 12/29/19 1802 12/29/19 1802 12/29/19 1846 12/29/19 2300 12/30/19 0134 12/30/19 0500 12/31/19 0342  NA 141   < > 140 139 141 141 141  K 5.3*   < > 5.2* 4.4 4.3 4.5 4.7  CL 106  --  106 108  --  113* 112*  CO2 24  --   --   --   --  22 19*  GLUCOSE 109*  --  103* 134*  --  149* 103*  BUN 32*  --  34* 26*  --  28* 33*  CREATININE 1.76*  --  1.90* 1.50*  --  1.44* 1.88*  CALCIUM 8.9  --   --   --   --  7.9* 8.0*  MG  --   --   --   --   --   --  1.9   < > = values in this interval not displayed.   Recent Labs  Lab 12/29/19 1802  AST 17  ALT 11  ALKPHOS 82  BILITOT 0.6  PROT 6.2*  ALBUMIN 3.2*   Recent Labs  Lab 12/29/19 1802 12/29/19 1802 12/29/19 1846 12/29/19 2300 12/30/19 0134 12/30/19 0500 12/31/19 0342  WBC 5.8  --   --   --   --   7.8 10.4  NEUTROABS 3.9  --   --   --   --  7.3  --   HGB 11.8*   < > 11.6* 9.2* 8.8* 8.4* 7.8*  HCT 38.3   < > 34.0* 27.0* 26.0* 26.6* 24.7*  MCV 104.4*  --   --   --   --  103.1* 103.8*  PLT 225  --   --   --   --  200 223   < > = values in this interval not displayed.   No results for input(s): CKTOTAL, CKMB, CKMBINDEX, TROPONINI in the last 168 hours. Recent Labs    12/29/19 1802  LABPROT 13.3  INR 1.1   No results for input(s): COLORURINE, LABSPEC, PHURINE, GLUCOSEU, HGBUR, BILIRUBINUR, KETONESUR, PROTEINUR, UROBILINOGEN, NITRITE, LEUKOCYTESUR in the last 72 hours.  Invalid input(s): APPERANCEUR     Component Value Date/Time   CHOL 143 12/30/2019 0500   TRIG 138 12/31/2019 0342   HDL 41 12/30/2019 0500   CHOLHDL  3.5 12/30/2019 0500   VLDL 12 12/30/2019 0500   LDLCALC 90 12/30/2019 0500   Lab Results  Component Value Date   HGBA1C 5.2 12/30/2019   No results found for: LABOPIA, COCAINSCRNUR, LABBENZ, AMPHETMU, THCU, LABBARB  No results for input(s): ETH in the last 168 hours.  I have personally reviewed the radiological images below and agree with the radiology interpretations.  CT ABDOMEN PELVIS WO CONTRAST  Result Date: 12/29/2019 CLINICAL DATA:  Right inguinal hematoma, recent neuro interventional procedure, assess for retroperitoneal hematoma EXAM: CT ABDOMEN AND PELVIS WITHOUT CONTRAST TECHNIQUE: Multidetector CT imaging of the abdomen and pelvis was performed following the standard protocol without IV contrast. COMPARISON:  11/08/2019 FINDINGS: Lower chest: Hypoventilatory changes are seen within the dependent lower lobes. Heart is enlarged with trace pericardial fluid unchanged. Hepatobiliary: Gallbladder surgically absent. Unenhanced imaging the liver demonstrates no gross abnormalities. Pancreas: Unremarkable. No pancreatic ductal dilatation or surrounding inflammatory changes. Spleen: Normal in size without focal abnormality. Adrenals/Urinary Tract: Excreted  contrast is seen within the bilateral kidneys related to previous neuro interventional procedure. Bilateral renal cortical thinning is noted. Stable bilateral adrenal adenoma. Bilateral ureteral stents extend from the renal pelves into the bladder lumen. No filling defects within the bladder. Stomach/Bowel: Enteric catheter extends into the gastric lumen. No bowel obstruction or ileus. No bowel wall thickening or inflammatory change. Vascular/Lymphatic: Aortic atherosclerosis. No enlarged abdominal or pelvic lymph nodes. Reproductive: Uterus and bilateral adnexa are unremarkable. Other: Hematoma is seen within the right inguinal region extending along the inguinal crease into the pannus of the right lower quadrant abdominal wall. This measures up to 3.7 cm in thickness. There is no evidence of extension into the retroperitoneal space. At the time of the exam, extrinsic compression is being held at the right groin. There is no free fluid or free gas within the peritoneal cavity. Fat containing hernia again noted within the left lower quadrant abdominal wall. No bowel herniation. Musculoskeletal: No acute or destructive bony lesions. Reconstructed images demonstrate no additional findings. IMPRESSION: 1. Hematoma within the right inguinal region extending along the inguinal crease into the pannus of the right lower quadrant abdominal wall. No extension into the retroperitoneal space. 2. Bilateral ureteral stents as above. 3. Stable bilateral adrenal adenoma. 4. Aortic Atherosclerosis (ICD10-I70.0). Electronically Signed   By: Randa Ngo M.D.   On: 12/29/2019 23:48   CT Code Stroke CTA Head W/WO contrast  Result Date: 12/29/2019 CLINICAL DATA:  Left-sided weakness, code stroke follow-up EXAM: CT ANGIOGRAPHY HEAD AND NECK TECHNIQUE: Multidetector CT imaging of the head and neck was performed using the standard protocol during bolus administration of intravenous contrast. Multiplanar CT image reconstructions and  MIPs were obtained to evaluate the vascular anatomy. Carotid stenosis measurements (when applicable) are obtained utilizing NASCET criteria, using the distal internal carotid diameter as the denominator. CONTRAST:  65mL OMNIPAQUE IOHEXOL 350 MG/ML SOLN COMPARISON:  None. FINDINGS: CTA NECK Aortic arch: Minimal calcified plaque along the aortic arch. Mild calcified plaque at the great vessel origins, which are patent. Right carotid system: Patent. Common carotid has a retropharyngeal course. Mild calcified plaque at the ICA origin causing minimal stenosis. Left carotid system: Patent. Common carotid has a retropharyngeal course. Mild calcified plaque at the ICA origin causing minimal stenosis. Vertebral arteries: Patent. Left vertebral artery is dominant. Suspected stenosis of the proximal right vertebral artery. Skeleton: Degenerative changes of the cervical spine. Other neck: No mass or adenopathy. Upper chest: Enlargement of the main pulmonary artery suggesting pulmonary arterial hypertension.  Review of the MIP images confirms the above findings CTA HEAD Anterior circulation: Intracranial internal carotid arteries are patent with calcified plaque causing mild stenosis. Anterior cerebral arteries are patent. Left A1 ACA is dominant with diminutive or absent right A1 segment. There is occlusion of the mid right M1 MCA. There is preserved opacification of the more distal right MCA territory. Left MCA is patent. Posterior circulation: Intracranial vertebral arteries are patent. Basilar artery is patent. Posterior cerebral arteries are patent. There are posterior communicating arteries present bilaterally with fetal origin of the right PCA. Venous sinuses: Patent as allowed by contrast bolus timing. Review of the MIP images confirms the above findings IMPRESSION: Occlusion of the right M1 MCA. However, there is good filling of the more distal MCA territory. No hemodynamically significant stenosis in the neck. These  results were communicated to Dr. Lorraine Lax at [place] 6/27/2021by text page via the [facility] messaging system. Electronically Signed   By: Macy Mis M.D.   On: 12/29/2019 19:10   CT HEAD WO CONTRAST  Result Date: 12/29/2019 CLINICAL DATA:  Stroke follow-up.  Status post thrombectomy EXAM: CT HEAD WITHOUT CONTRAST TECHNIQUE: Contiguous axial images were obtained from the base of the skull through the vertex without intravenous contrast. COMPARISON:  Head CT 12/29/2019 FINDINGS: Brain: There is hyperdense material in the right subarachnoid space, overlying the frontal operculum and within the sylvian fissure. No other extra-axial collection. No midline shift or other mass effect. No intraparenchymal hemorrhage. 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 Vascular: Status post right MCA stent placement. Skull: Normal. Negative for fracture or focal lesion. Sinuses/Orbits: No acute finding. Other: None. IMPRESSION: 1. Hyperdense material in the right subarachnoid space, overlying the frontal operculum and within the Sylvian fissure. This is most likely contrast staining, but follow-up studies will be necessary to differentiate from subarachnoid hemorrhage. 2. Unchanged left parafalcine meningioma. Electronically Signed   By: Ulyses Jarred M.D.   On: 12/29/2019 23:49   CT Code Stroke CTA Neck W/WO contrast  Result Date: 12/29/2019 CLINICAL DATA:  Left-sided weakness, code stroke follow-up EXAM: CT ANGIOGRAPHY HEAD AND NECK TECHNIQUE: Multidetector CT imaging of the head and neck was performed using the standard protocol during bolus administration of intravenous contrast. Multiplanar CT image reconstructions and MIPs were obtained to evaluate the vascular anatomy. Carotid stenosis measurements (when applicable) are obtained utilizing NASCET criteria, using the distal internal carotid diameter as the denominator. CONTRAST:  43mL OMNIPAQUE IOHEXOL 350 MG/ML SOLN COMPARISON:  None. FINDINGS: CTA NECK Aortic arch:  Minimal calcified plaque along the aortic arch. Mild calcified plaque at the great vessel origins, which are patent. Right carotid system: Patent. Common carotid has a retropharyngeal course. Mild calcified plaque at the ICA origin causing minimal stenosis. Left carotid system: Patent. Common carotid has a retropharyngeal course. Mild calcified plaque at the ICA origin causing minimal stenosis. Vertebral arteries: Patent. Left vertebral artery is dominant. Suspected stenosis of the proximal right vertebral artery. Skeleton: Degenerative changes of the cervical spine. Other neck: No mass or adenopathy. Upper chest: Enlargement of the main pulmonary artery suggesting pulmonary arterial hypertension. Review of the MIP images confirms the above findings CTA HEAD Anterior circulation: Intracranial internal carotid arteries are patent with calcified plaque causing mild stenosis. Anterior cerebral arteries are patent. Left A1 ACA is dominant with diminutive or absent right A1 segment. There is occlusion of the mid right M1 MCA. There is preserved opacification of the more distal right MCA territory. Left MCA is patent. Posterior circulation: Intracranial vertebral arteries  are patent. Basilar artery is patent. Posterior cerebral arteries are patent. There are posterior communicating arteries present bilaterally with fetal origin of the right PCA. Venous sinuses: Patent as allowed by contrast bolus timing. Review of the MIP images confirms the above findings IMPRESSION: Occlusion of the right M1 MCA. However, there is good filling of the more distal MCA territory. No hemodynamically significant stenosis in the neck. These results were communicated to Dr. Lorraine Lax at [place] 6/27/2021by text page via the [facility] messaging system. Electronically Signed   By: Macy Mis M.D.   On: 12/29/2019 19:10   MR ANGIO HEAD WO CONTRAST  Result Date: 12/30/2019 CLINICAL DATA:  Status post right M1 occlusion with intervention EXAM:  MRI HEAD WITHOUT CONTRAST MRA HEAD WITHOUT CONTRAST TECHNIQUE: Multiplanar, multiecho pulse sequences of the brain and surrounding structures were obtained without intravenous contrast. Angiographic images of the head were obtained using MRA technique without contrast. COMPARISON:  Multiple CT studies done yesterday. FINDINGS: MRI HEAD FINDINGS Brain: Diffusion imaging shows acute infarction affecting the right occipital cortex. Acute infarction affects the right insular region and frontal operculum. Small foci of acute infarction elsewhere in the right frontal and parietal regions. Areas of infarction show mild swelling but there is no mass effect. Small focus of susceptibility in the right sylvian fissure region may be intravascular. Few small foci of susceptibility in the right parietal region also suspected to be intravascular. No sign of parenchymal hematoma. No hydrocephalus. No extra-axial collection. Mild chronic small-vessel ischemic changes elsewhere throughout the cerebral hemispheric white matter. 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 Vascular: Stent in the right M1 region. Skull and upper cervical spine: Negative Sinuses/Orbits: Clear/normal Other: None MRA HEAD FINDINGS Both internal carotid arteries show antegrade flow through the skull base. On the left, there is supply in the left MCA territory and both anterior cerebral artery territories. Moderate atherosclerotic irregularity of the MCA branches. On the right, there is signal loss related to the right MCA stent. More distal MCA branch vessels do show supply, with absence of the superior division. Right vertebral artery terminates in PICA. Left vertebral artery supplies the basilar. 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 Left PCA arises from the basilar tip. Distal vessel atherosclerotic irregularity. Right PCA arises from the right carotid and  shows poor supply of the distal branches. 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 Fetal origin right PCA with poor flow in the distal branch vessels. Acute infarction of the right insular region and frontal operculum. Missing superior division right MCA. Small scattered other acute infarctions in the right frontal and parietal regions. Swelling but no mass effect. No intraparenchymal hematoma. Few small foci of petechial blood versus vascular susceptibility signal. Right MCA stent. Missing superior division as noted above. Inferior division shows flow. Electronically Signed   By: Nelson Chimes M.D.   On: 12/30/2019 19:21   MR BRAIN WO CONTRAST  Result Date: 12/30/2019 CLINICAL DATA:  Status post right M1 occlusion with intervention EXAM: MRI HEAD WITHOUT CONTRAST MRA HEAD WITHOUT CONTRAST TECHNIQUE: Multiplanar, multiecho pulse sequences of the brain and surrounding structures were obtained without intravenous contrast. Angiographic images of the head were obtained using MRA technique without contrast. COMPARISON:  Multiple CT studies done yesterday. FINDINGS: MRI HEAD FINDINGS Brain: Diffusion imaging shows acute infarction affecting the right occipital cortex. Acute infarction affects the right insular region and frontal operculum. Small foci of acute infarction elsewhere  in the right frontal and parietal regions. Areas of infarction show mild swelling but there is no mass effect. Small focus of susceptibility in the right sylvian fissure region may be intravascular. Few small foci of susceptibility in the right parietal region also suspected to be intravascular. No sign of parenchymal hematoma. No hydrocephalus. No extra-axial collection. Mild chronic small-vessel ischemic changes elsewhere throughout the cerebral hemispheric white matter. 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 Vascular: Stent in the right M1  region. Skull and upper cervical spine: Negative Sinuses/Orbits: Clear/normal Other: None MRA HEAD FINDINGS Both internal carotid arteries show antegrade flow through the skull base. On the left, there is supply in the left MCA territory and both anterior cerebral artery territories. Moderate atherosclerotic irregularity of the MCA branches. On the right, there is signal loss related to the right MCA stent. More distal MCA branch vessels do show supply, with absence of the superior division. Right vertebral artery terminates in PICA. Left vertebral artery supplies the basilar. 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 Left PCA arises from the basilar tip. Distal vessel atherosclerotic irregularity. Right PCA arises from the right carotid and shows poor supply of the distal branches. 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 Fetal origin right PCA with poor flow in the distal branch vessels. Acute infarction of the right insular region and frontal operculum. Missing superior division right MCA. Small scattered other acute infarctions in the right frontal and parietal regions. Swelling but no mass effect. No intraparenchymal hematoma. Few small foci of petechial blood versus vascular susceptibility signal. Right MCA stent. Missing superior division as noted above. Inferior division shows flow. Electronically Signed   By: Nelson Chimes M.D.   On: 12/30/2019 19:21   IR Intra Cran Stent  Result Date: 12/31/2019 INDICATION: New onset left-sided hemiplegia, right gaze deviation. Occluded right middle cerebral M1 segment on CT angiogram of the head and neck. EXAM: 1. EMERGENT LARGE VESSEL OCCLUSION THROMBOLYSIS (anterior CIRCULATION) COMPARISON:  CT angiogram of the head and neck of June, 27, 2021. MEDICATIONS: Vancomycin 1 g IV was administered within 1 hour of the procedure. ANESTHESIA/SEDATION: General anesthesia CONTRAST:  Isovue 300 approximately 170 mL FLUOROSCOPY TIME:   Fluoroscopy Time: 131 minutes 12 seconds (4835 mGy). COMPLICATIONS: None immediate. TECHNIQUE: Following a full explanation of the procedure along with the potential associated complications, an informed witnessed consent was obtained the patient's son. The risks of intracranial hemorrhage of 10%, worsening neurological deficit, ventilator dependency, death and inability to revascularize were all reviewed in detail with the patient's son. The patient was then put under general anesthesia by the Department of Anesthesiology at [facility]. The right groin was prepped and draped in the usual sterile fashion. Thereafter using modified Seldinger technique, transfemoral access into the right common femoral artery was obtained without difficulty. Over a 0.035 inch guidewire an 8 French 25 cm Pinnacle sheath was inserted. Through this, and also over a 0.035 inch guidewire a combination of a select 5 French 125 cm Simmons 2 catheter inside of a 95 cm 087 balloon guide catheter was advanced without difficulty to the right common carotid artery. The guidewire and the select catheter were removed. Good aspiration was obtained from the hub of the balloon guide catheter just proximal to the right common carotid artery bifurcation. Arteriogram was then performed centered extra cranially and intracranially. FINDINGS: The right common carotid arteriogram demonstrates the right external carotid artery and  its major branches to be widely patent. The right internal carotid artery at the bulb demonstrates a smooth shallow plaque. Moderate tortuosity was noted of the proximal right internal carotid artery. 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 Distal to this the cervical petrous junction demonstrates  normal opacification. Distal to this the petrous, cavernous, and the cavernous segments demonstrate patency as does the supraclinoid segment. Opacification is seen of the right posterior communicating artery opacifying the right posterior cerebral distribution. The right middle cerebral artery demonstrates complete angiographic occlusion at the origin of the anterior temporal branch. Focal areas of caliber irregularity are seen of the proximal right middle cerebral artery. PROCEDURE: Through the 087 balloon guide catheter in the proximal right internal carotid artery, a Zoom 071 135 cm catheter inside of which was an 021 160 cm Phenom microcatheter was advanced over a 0.014 inch standard Synchro micro guidewire gingerly through the right internal carotid artery in the neck and into the supraclinoid right ICA. The micro guidewire was then gently manipulated with a torque device and advanced into the inferior division M2 M3 region of the right middle cerebral artery followed by the microcatheter. The guidewire was removed. Good aspiration was obtained from the hub of the microcatheter. Gentle contrast injection demonstrated safe position of tip of the microcatheter. A 5 mm x 37 mm Embotrap retrieval device was then advanced to the distal end of the microcatheter. The O ring on the delivery micro guidewire was loosened. With slight forward gentle traction with the right hand on the delivery micro guidewire, with the left hand the retrieval device was deployed. The 071 Zoom catheter was then advanced into the right middle cerebral artery engaging the occluded right middle cerebral artery. With constant flow arrest in the right internal carotid artery, and constant aspiration using a Penumbra device at the hub of the 071 Zoom catheter for approximately 3 minutes, the combination of the retrieval device, the microcatheter, and the Zoom aspiration catheter were retrieved and removed. Following reversal of flow arrest, a  control arteriogram performed through the balloon guide catheter in the right internal carotid artery demonstrates revascularization of the right middle cerebral artery and also of the superior and inferior divisions. The anterior temporal branch remains patent. A TICI 2B revascularization was achieved. This also unmasked an irregular filling defect at the right middle cerebral artery terminus at the trifurcation region with flow noted in the distal inferior division. A second pass was then made again with the combination of the 071 Zoom catheter advanced over an 021 160 cm Phenom microcatheter over a 0.014 inch standard Synchro micro guidewire. Again access was obtained into the distal M2 M3 region of the inferior division with the micro guidewire then followed by the microcatheter. The guidewire was removed. Again safe position of the tip of the microcatheter was confirmed and connected to continuous heparinized saline infusion. A 4 mm x 40 mm Solitaire X retrieval device was then deployed in the manner described above. Again with proximal flow arrest in the right internal carotid artery proximally, and constant aspiration via the Zoom catheter which was now imbedded in the occluded right middle cerebral artery at its trifurcation region over approximately 2-1/2 minutes, the combination of the retrieval device, the microcatheter, and the Zoom catheter was retrieved and removed. Following reversal of flow arrest,  a control arteriogram performed through the right internal carotid artery demonstrated improved opacification of the right MCA trifurcation branches. There continued be occluded superior division. The combination of the microcatheter, inside a 071 Zoom aspiration catheter was again advanced to the right middle cerebral artery over a 0.014 inch standard Synchro micro guidewire, which was now advanced into the superior division of the right middle cerebral artery emanating at the MCA trifurcation region. The  microcatheter was advanced to the M2 region over a micro guidewire. The micro guidewire was removed. Good aspiration obtained from the hub of the microcatheter. Again with flow arrest in the right internal carotid artery proximally, and constant aspiration with the Penumbra aspiration device at the hub of the aspiration catheter following deployment of a 3 mm x 20 mm Solitaire X retrieval device, for 2 minutes, the combination of the retrieval device, the microcatheter and the Zoom catheter were retrieved and removed. The superior division continued to be occluded. The superior division was again cannulated with a microcatheter over a micro guidewire as described above. After having verified safe position of the tip of the microcatheter, in the superior division M2 region, a 4 mm x 40 mm Solitaire X retrieval device was deployed in the usual manner. Again with proximal flow arrest in the proximal right ICA, and constant aspiration at the hub of the Zoom catheter at the site of the occluded superior division in the distal right middle cerebral M1 segment over 2 minutes, the combination of the retrieval device, the microcatheter and the retrieval device were retrieved and removed. Following reverse of flow arrest, there appeared to be modest improved flow in the proximal superior division. It was now noted the inferior division had a near occlusive clot in the proximal aspect. A fourth pass was then made this time using a combination of the Phenom microcatheter, inside of a 6 [place] Catalyst 135 cm catheter advanced over a 0.014 inch standard Synchro micro guidewire to the distal right M1 segment. The micro guidewire was then advanced into the M2 M3 region of the inferior division followed by the microcatheter. The guidewire was removed. A 4 mm x 40 mm Solitaire X retrieval device was deployed with the Catalyst guide catheter advanced at the origin of inferior division and distal to this. Following proximal flow  arrest and constant aspiration for approximately 2 minutes at the hub of the Catalyst guide catheter, the combination of the retrieval device, the microcatheter and the Catalyst catheter were retrieved and removed following reversal of flow arrest. Improved flow was now noted into the inferior division proximally through the two main branch points. Following each thrombectomy, strips of fragments were seen intertwined either in the retrieval device, or in the aspiration catheters. 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 It was therefore decided to proceed with placement of a rescue stent in order to prevent occlusion of the major right MCA inferior division branch. The microcatheter was again advanced over a 0.014 inch standard Synchro micro guidewire with a 6 [place] Catalyst guide catheter. The microcatheter was advanced over a micro guidewire without difficulty into the M2 region followed by the removal of the micro guidewire. Again good aspiration obtained from the hub of the microcatheter. This was then connected to continuous heparinized saline infusion. Measurements performed of the right middle cerebral artery in the mid M1 segment, and also the proximal inferior division distal to the severe stenosis. A 4 mm x 24 mm  Neuroform Atlas stent was advanced to the distal end of the microcatheter. The O ring on the delivery microcatheter was then loosened. With slight forward gentle traction with the right hand on the delivery micro guidewire with left hand the delivery microcatheter was gently retrieved unsheathing the distal and then the proximal portion of the stent with excellent coverage at the site of the severe stenosis. The delivery apparatus was removed. A control arteriogram performed through the balloon guide in the distal cervical ICA on the right demonstrated now significantly improved caliber and flow through the right  middle cerebral artery the dominant inferior division. There was poor stagnant flow noted in the proximal superior division. Free flow through the stented segment and also the other branch of the right middle cerebral artery except for the superior division. 8 mg of Integrilin was given intra-arterially in order to prevent intra stent platelet aggregation. A 10 minute post stent arteriogram continued to demonstrate flow through the stented segment in the right MCA distribution except for the non dominant superior division. A TICI 2b revascularization was maintained. The right posterior communicating artery with the right posterior cerebral artery distribution remained unchanged. Throughout the procedure, the patient's blood pressure and neurological status remained stable. A CT of the brain performed following the third pass demonstrated no mass-effect or midline shift with mild element of contrast in the right perisylvian region. The balloon guide was then retrieved and removed. The 8 French 25 cm Pinnacle sheath was then exchanged over a 0.035 inch J-tip guidewire for a short 8 [place] Pinnacle sheath. An arteriogram performed through this demonstrated spasm in the previously noted tortuous right common iliac artery. There was small amount of contrast noted in the soft tissue at the site of entry of the 8 [place] Pinnacle sheath which completely disappeared on repeat arteriogram performed approximately 3 minutes later. The 8 French sheath was removed and a 7 [place] ExoSeal closure device was placed. Also minimal compression was held for approximately 25 minutes. The distal pulses remained Dopplerable in the dorsalis pedis, and the posterior tibial regions bilaterally. Patient was left intubated on account of the patient's inability to communicate due to difficulty hearing and also her neurological condition. During this time, while pressure was held, and firmness was felt in the region of the pannus on the right  side in the right lower quadrant, and also in the right inguinal region. In view of the unstable blood pressure, the patient was sent to CT scan for CT of the pelvis and also of the abdomen and also CT of the brain. CT of the brain revealed no evidence of mass effect or midline shift with no significant change in the right perisylvian hyperattenuation felt to be due to a contrast stain. CT of the abdomen and pelvis revealed no evidence of the overlying skin tightness. Hemodynamically the patient was now stable with blood pressure in the 150s over 80s and the heart rate being regular in the 80s to 90s sinus rhythm. Patient was then transferred to the neuro ICU intubated for post thrombectomy management. IMPRESSION: Status post endovascular revascularization of occluded right middle cerebral M1 segment with 4 passes with different stent retrievers and Penumbra aspiration achieving a TICI 2B revascularization. Status post rescue stent placement from the distal M1 segment into the proximal dominant inferior division of the right middle cerebral artery due to underlying severe intracranial arteriosclerosis. PLAN: Follow-up in the clinic 4 weeks post discharge. Electronically Signed   By: Corky Downs.D.  On: 12/30/2019 17:15   IR CT Head Ltd  Result Date: 12/31/2019 INDICATION: New onset left-sided hemiplegia, right gaze deviation. Occluded right middle cerebral M1 segment on CT angiogram of the head and neck. EXAM: 1. EMERGENT LARGE VESSEL OCCLUSION THROMBOLYSIS (anterior CIRCULATION) COMPARISON:  CT angiogram of the head and neck of June, 27, 2021. MEDICATIONS: Vancomycin 1 g IV was administered within 1 hour of the procedure. ANESTHESIA/SEDATION: General anesthesia CONTRAST:  Isovue 300 approximately 170 mL FLUOROSCOPY TIME:  Fluoroscopy Time: 131 minutes 12 seconds (4835 mGy). COMPLICATIONS: None immediate. TECHNIQUE: Following a full explanation of the procedure along with the potential associated  complications, an informed witnessed consent was obtained the patient's son. The risks of intracranial hemorrhage of 10%, worsening neurological deficit, ventilator dependency, death and inability to revascularize were all reviewed in detail with the patient's son. The patient was then put under general anesthesia by the Department of Anesthesiology at [facility]. The right groin was prepped and draped in the usual sterile fashion. Thereafter using modified Seldinger technique, transfemoral access into the right common femoral artery was obtained without difficulty. Over a 0.035 inch guidewire an 8 French 25 cm Pinnacle sheath was inserted. Through this, and also over a 0.035 inch guidewire a combination of a select 5 French 125 cm Simmons 2 catheter inside of a 95 cm 087 balloon guide catheter was advanced without difficulty to the right common carotid artery. The guidewire and the select catheter were removed. Good aspiration was obtained from the hub of the balloon guide catheter just proximal to the right common carotid artery bifurcation. Arteriogram was then performed centered extra cranially and intracranially. FINDINGS: The right common carotid arteriogram demonstrates the right external carotid artery and its major branches to be widely patent. The right internal carotid artery at the bulb demonstrates a smooth shallow plaque. Moderate tortuosity was noted of the proximal right internal carotid artery. 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 Distal to this the cervical petrous junction demonstrates normal opacification. Distal to this the petrous, cavernous, and the cavernous segments demonstrate patency as does the supraclinoid segment. Opacification is seen of the right  posterior communicating artery opacifying the right posterior cerebral distribution. The right middle cerebral artery demonstrates complete angiographic occlusion at the origin of the anterior temporal branch. Focal areas of caliber irregularity are seen of the proximal right middle cerebral artery. PROCEDURE: Through the 087 balloon guide catheter in the proximal right internal carotid artery, a Zoom 071 135 cm catheter inside of which was an 021 160 cm Phenom microcatheter was advanced over a 0.014 inch standard Synchro micro guidewire gingerly through the right internal carotid artery in the neck and into the supraclinoid right ICA. The micro guidewire was then gently manipulated with a torque device and advanced into the inferior division M2 M3 region of the right middle cerebral artery followed by the microcatheter. The guidewire was removed. Good aspiration was obtained from the hub of the microcatheter. Gentle contrast injection demonstrated safe position of tip of the microcatheter. A 5 mm x 37 mm Embotrap retrieval device was then advanced to the distal end of the microcatheter. The O ring on the delivery micro guidewire was loosened. With slight forward gentle traction with the right hand on the delivery micro guidewire, with the left hand the retrieval device was  deployed. The 071 Zoom catheter was then advanced into the right middle cerebral artery engaging the occluded right middle cerebral artery. With constant flow arrest in the right internal carotid artery, and constant aspiration using a Penumbra device at the hub of the 071 Zoom catheter for approximately 3 minutes, the combination of the retrieval device, the microcatheter, and the Zoom aspiration catheter were retrieved and removed. Following reversal of flow arrest, a control arteriogram performed through the balloon guide catheter in the right internal carotid artery demonstrates revascularization of the right middle cerebral artery and also  of the superior and inferior divisions. The anterior temporal branch remains patent. A TICI 2B revascularization was achieved. This also unmasked an irregular filling defect at the right middle cerebral artery terminus at the trifurcation region with flow noted in the distal inferior division. A second pass was then made again with the combination of the 071 Zoom catheter advanced over an 021 160 cm Phenom microcatheter over a 0.014 inch standard Synchro micro guidewire. Again access was obtained into the distal M2 M3 region of the inferior division with the micro guidewire then followed by the microcatheter. The guidewire was removed. Again safe position of the tip of the microcatheter was confirmed and connected to continuous heparinized saline infusion. A 4 mm x 40 mm Solitaire X retrieval device was then deployed in the manner described above. Again with proximal flow arrest in the right internal carotid artery proximally, and constant aspiration via the Zoom catheter which was now imbedded in the occluded right middle cerebral artery at its trifurcation region over approximately 2-1/2 minutes, the combination of the retrieval device, the microcatheter, and the Zoom catheter was retrieved and removed. Following reversal of flow arrest, a control arteriogram performed through the right internal carotid artery demonstrated improved opacification of the right MCA trifurcation branches. There continued be occluded superior division. The combination of the microcatheter, inside a 071 Zoom aspiration catheter was again advanced to the right middle cerebral artery over a 0.014 inch standard Synchro micro guidewire, which was now advanced into the superior division of the right middle cerebral artery emanating at the MCA trifurcation region. The microcatheter was advanced to the M2 region over a micro guidewire. The micro guidewire was removed. Good aspiration obtained from the hub of the microcatheter. Again with flow  arrest in the right internal carotid artery proximally, and constant aspiration with the Penumbra aspiration device at the hub of the aspiration catheter following deployment of a 3 mm x 20 mm Solitaire X retrieval device, for 2 minutes, the combination of the retrieval device, the microcatheter and the Zoom catheter were retrieved and removed. The superior division continued to be occluded. The superior division was again cannulated with a microcatheter over a micro guidewire as described above. After having verified safe position of the tip of the microcatheter, in the superior division M2 region, a 4 mm x 40 mm Solitaire X retrieval device was deployed in the usual manner. Again with proximal flow arrest in the proximal right ICA, and constant aspiration at the hub of the Zoom catheter at the site of the occluded superior division in the distal right middle cerebral M1 segment over 2 minutes, the combination of the retrieval device, the microcatheter and the retrieval device were retrieved and removed. Following reverse of flow arrest, there appeared to be modest improved flow in the proximal superior division. It was now noted the inferior division had a near occlusive clot in the proximal aspect. A fourth pass  was then made this time using a combination of the Phenom microcatheter, inside of a 6 [place] Catalyst 135 cm catheter advanced over a 0.014 inch standard Synchro micro guidewire to the distal right M1 segment. The micro guidewire was then advanced into the M2 M3 region of the inferior division followed by the microcatheter. The guidewire was removed. A 4 mm x 40 mm Solitaire X retrieval device was deployed with the Catalyst guide catheter advanced at the origin of inferior division and distal to this. Following proximal flow arrest and constant aspiration for approximately 2 minutes at the hub of the Catalyst guide catheter, the combination of the retrieval device, the microcatheter and the Catalyst  catheter were retrieved and removed following reversal of flow arrest. Improved flow was now noted into the inferior division proximally through the two main branch points. Following each thrombectomy, strips of fragments were seen intertwined either in the retrieval device, or in the aspiration catheters. 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 It was therefore decided to proceed with placement of a rescue stent in order to prevent occlusion of the major right MCA inferior division branch. The microcatheter was again advanced over a 0.014 inch standard Synchro micro guidewire with a 6 [place] Catalyst guide catheter. The microcatheter was advanced over a micro guidewire without difficulty into the M2 region followed by the removal of the micro guidewire. Again good aspiration obtained from the hub of the microcatheter. This was then connected to continuous heparinized saline infusion. Measurements performed of the right middle cerebral artery in the mid M1 segment, and also the proximal inferior division distal to the severe stenosis. A 4 mm x 24 mm Neuroform Atlas stent was advanced to the distal end of the microcatheter. The O ring on the delivery microcatheter was then loosened. With slight forward gentle traction with the right hand on the delivery micro guidewire with left hand the delivery microcatheter was gently retrieved unsheathing the distal and then the proximal portion of the stent with excellent coverage at the site of the severe stenosis. The delivery apparatus was removed. A control arteriogram performed through the balloon guide in the distal cervical ICA on the right demonstrated now significantly improved caliber and flow through the right middle cerebral artery the dominant inferior division. There was poor stagnant flow noted in the proximal superior division. Free flow through the stented segment and also the other  branch of the right middle cerebral artery except for the superior division. 8 mg of Integrilin was given intra-arterially in order to prevent intra stent platelet aggregation. A 10 minute post stent arteriogram continued to demonstrate flow through the stented segment in the right MCA distribution except for the non dominant superior division. A TICI 2b revascularization was maintained. The right posterior communicating artery with the right posterior cerebral artery distribution remained unchanged. Throughout the procedure, the patient's blood pressure and neurological status remained stable. A CT of the brain performed following the third pass demonstrated no mass-effect or midline shift with mild element of contrast in the right perisylvian region. The balloon guide was then retrieved and removed. The 8 French 25 cm Pinnacle sheath was then exchanged over a 0.035 inch J-tip guidewire for a short 8 [place] Pinnacle sheath. An arteriogram performed through this demonstrated spasm in the previously noted tortuous right common iliac artery. There was small amount of contrast noted in the soft tissue at the site of entry of the [place]  sheath which completely disappeared on repeat arteriogram performed approximately 3 minutes later. The 8 French sheath was removed and a 7 [place] ExoSeal closure device was placed. Also minimal compression was held for approximately 25 minutes. The distal pulses remained Dopplerable in the dorsalis pedis, and the posterior tibial regions bilaterally. Patient was left intubated on account of the patient's inability to communicate due to difficulty hearing and also her neurological condition. During this time, while pressure was held, and firmness was felt in the region of the pannus on the right side in the right lower quadrant, and also in the right inguinal region. In view of the unstable blood pressure, the patient was sent to CT scan for CT of the pelvis and also of the  abdomen and also CT of the brain. CT of the brain revealed no evidence of mass effect or midline shift with no significant change in the right perisylvian hyperattenuation felt to be due to a contrast stain. CT of the abdomen and pelvis revealed no evidence of the overlying skin tightness. Hemodynamically the patient was now stable with blood pressure in the 150s over 80s and the heart rate being regular in the 80s to 90s sinus rhythm. Patient was then transferred to the neuro ICU intubated for post thrombectomy management. IMPRESSION: Status post endovascular revascularization of occluded right middle cerebral M1 segment with 4 passes with different stent retrievers and Penumbra aspiration achieving a TICI 2B revascularization. Status post rescue stent placement from the distal M1 segment into the proximal dominant inferior division of the right middle cerebral artery due to underlying severe intracranial arteriosclerosis. PLAN: Follow-up in the clinic 4 weeks post discharge. Electronically Signed   By: Luanne Bras M.D.   On: 12/30/2019 17:15   DG Chest Port 1 View  Result Date: 12/30/2019 CLINICAL DATA:  Intubation. EXAM: PORTABLE CHEST 1 VIEW COMPARISON:  Radiograph 11/08/2019. FINDINGS: Low positioning of the endotracheal tube 6 mm from the carina. Enteric tube in place with tip below the diaphragm not included in this chest field of view. Stable cardiomegaly. Unchanged mediastinal contours. Interstitial coarsening which appears chronic. No focal airspace disease, pleural effusion, or pneumothorax. No acute osseous abnormalities are seen. IMPRESSION: 1. Low positioning of the endotracheal tube 6 mm from the carina. Retraction of 2-3 cm recommended. 2. Enteric tube in place with tip below the diaphragm. 3. Stable cardiomegaly and chronic interstitial coarsening. Electronically Signed   By: Keith Rake M.D.   On: 12/30/2019 01:11   DG Abd Portable 1V  Result Date: 12/30/2019 CLINICAL DATA:  OG  tube placement. EXAM: PORTABLE ABDOMEN - 1 VIEW COMPARISON:  Abdomen pelvis CT yesterday. FINDINGS: Tip and side port of the enteric tube below the diaphragm in the stomach. Excreted IV contrast within both renal collecting systems and urinary bladder. Bilateral ureteral stents in place. Nephrograms demonstrate mild hydronephrosis. Nonobstructive bowel gas pattern. IMPRESSION: 1. Tip and side port of the enteric tube below the diaphragm in the stomach. 2. Bilateral ureteral stents in place. Excreted IV contrast in both renal collecting systems in the urinary bladder with mild bilateral hydronephrosis. Electronically Signed   By: Keith Rake M.D.   On: 12/30/2019 01:13   DG C-Arm 1-60 Min-No Report  Result Date: 12/25/2019 Fluoroscopy was utilized by the requesting physician.  No radiographic interpretation.   DG C-Arm 1-60 Min-No Report  Result Date: 12/06/2019 Fluoroscopy was utilized by the requesting physician.  No radiographic interpretation.   VAS [place] GROIN PSEUDOANEURYSM  Result Date: 12/31/2019  ARTERIAL  PSEUDOANEURYSM  Exam: Right groin Indications: Patient complains of bruising. Comparison Study: no prior Performing Technologist: Abram Sander RVS  Examination Guidelines: A complete evaluation includes B-mode imaging, spectral Doppler, color Doppler, and power Doppler as needed of all accessible portions of each vessel. Bilateral testing is considered an integral part of a complete examination. Limited examinations for reoccurring indications may be performed as noted. +------------+----------+--------+------+----------+ Right DuplexPSV (cm/s)WaveformPlaqueComment(s) +------------+----------+--------+------+----------+ CFA            147    biphasic                 +------------+----------+--------+------+----------+ Prox SFA       146    biphasic                 +------------+----------+--------+------+----------+  Summary: No evidence of pseudoaneurysm, AVF or DVT   Diagnosing physician: Deitra Mayo MD Electronically signed by Deitra Mayo MD on 12/31/2019 at 8:54:10 AM.   --------------------------------------------------------------------------------    Final    IR PERCUTANEOUS ART THROMBECTOMY/INFUSION INTRACRANIAL INC DIAG ANGIO  Result Date: 12/31/2019 INDICATION: New onset left-sided hemiplegia, right gaze deviation. Occluded right middle cerebral M1 segment on CT angiogram of the head and neck. EXAM: 1. EMERGENT LARGE VESSEL OCCLUSION THROMBOLYSIS (anterior CIRCULATION) COMPARISON:  CT angiogram of the head and neck of June, 27, 2021. MEDICATIONS: Vancomycin 1 g IV was administered within 1 hour of the procedure. ANESTHESIA/SEDATION: General anesthesia CONTRAST:  Isovue 300 approximately 170 mL FLUOROSCOPY TIME:  Fluoroscopy Time: 131 minutes 12 seconds (4835 mGy). COMPLICATIONS: None immediate. TECHNIQUE: Following a full explanation of the procedure along with the potential associated complications, an informed witnessed consent was obtained the patient's son. The risks of intracranial hemorrhage of 10%, worsening neurological deficit, ventilator dependency, death and inability to revascularize were all reviewed in detail with the patient's son. The patient was then put under general anesthesia by the Department of Anesthesiology at [facility] [facility]. The right groin was prepped and draped in the usual sterile fashion. Thereafter using modified Seldinger technique, transfemoral access into the right common femoral artery was obtained without difficulty. Over a 0.035 inch guidewire an 8 French 25 cm Pinnacle sheath was inserted. Through this, and also over a 0.035 inch guidewire a combination of a select 5 French 125 cm Simmons 2 catheter inside of a 95 cm 087 balloon guide catheter was advanced without difficulty to the right common carotid artery. The guidewire and the select catheter were removed. Good aspiration was obtained from the hub of the  balloon guide catheter just proximal to the right common carotid artery bifurcation. Arteriogram was then performed centered extra cranially and intracranially. FINDINGS: The right common carotid arteriogram demonstrates the right external carotid artery and its major branches to be widely patent. The right internal carotid artery at the bulb demonstrates a smooth shallow plaque. Moderate tortuosity was noted of the proximal right internal carotid artery. 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 Distal to this the cervical petrous junction demonstrates normal opacification. Distal to this the petrous, cavernous, and the cavernous segments demonstrate patency as does the supraclinoid segment. Opacification is seen of the right posterior communicating artery opacifying the right posterior cerebral distribution. The right middle cerebral artery demonstrates complete angiographic occlusion at the origin of the anterior temporal branch. Focal areas of caliber irregularity are seen  of the proximal right middle cerebral artery. PROCEDURE: Through the 087 balloon guide catheter in the proximal right internal carotid artery, a Zoom 071 135 cm catheter inside of which was an 021 160 cm Phenom microcatheter was advanced over a 0.014 inch standard Synchro micro guidewire gingerly through the right internal carotid artery in the neck and into the supraclinoid right ICA. The micro guidewire was then gently manipulated with a torque device and advanced into the inferior division M2 M3 region of the right middle cerebral artery followed by the microcatheter. The guidewire was removed. Good aspiration was obtained from the hub of the microcatheter. Gentle contrast injection demonstrated safe position of tip  of the microcatheter. A 5 mm x 37 mm Embotrap retrieval device was then advanced to the distal end of the microcatheter. The O ring on the delivery micro guidewire was loosened. With slight forward gentle traction with the right hand on the delivery micro guidewire, with the left hand the retrieval device was deployed. The 071 Zoom catheter was then advanced into the right middle cerebral artery engaging the occluded right middle cerebral artery. With constant flow arrest in the right internal carotid artery, and constant aspiration using a Penumbra device at the hub of the 071 Zoom catheter for approximately 3 minutes, the combination of the retrieval device, the microcatheter, and the Zoom aspiration catheter were retrieved and removed. Following reversal of flow arrest, a control arteriogram performed through the balloon guide catheter in the right internal carotid artery demonstrates revascularization of the right middle cerebral artery and also of the superior and inferior divisions. The anterior temporal branch remains patent. A TICI 2B revascularization was achieved. This also unmasked an irregular filling defect at the right middle cerebral artery terminus at the trifurcation region with flow noted in the distal inferior division. A second pass was then made again with the combination of the 071 Zoom catheter advanced over an 021 160 cm Phenom microcatheter over a 0.014 inch standard Synchro micro guidewire. Again access was obtained into the distal M2 M3 region of the inferior division with the micro guidewire then followed by the microcatheter. The guidewire was removed. Again safe position of the tip of the microcatheter was confirmed and connected to continuous heparinized saline infusion. A 4 mm x 40 mm Solitaire X retrieval device was then deployed in the manner described above. Again with proximal flow arrest in the right internal carotid artery proximally, and constant aspiration via the Zoom  catheter which was now imbedded in the occluded right middle cerebral artery at its trifurcation region over approximately 2-1/2 minutes, the combination of the retrieval device, the microcatheter, and the Zoom catheter was retrieved and removed. Following reversal of flow arrest, a control arteriogram performed through the right internal carotid artery demonstrated improved opacification of the right MCA trifurcation branches. There continued be occluded superior division. The combination of the microcatheter, inside a 071 Zoom aspiration catheter was again advanced to the right middle cerebral artery over a 0.014 inch standard Synchro micro guidewire, which was now advanced into the superior division of the right middle cerebral artery emanating at the MCA trifurcation region. The microcatheter was advanced to the M2 region over a micro guidewire. The micro guidewire was removed. Good aspiration obtained from the hub of the microcatheter. Again with flow arrest in the right internal carotid artery proximally, and constant aspiration with the Penumbra aspiration device at the hub of the aspiration catheter following deployment of a 3 mm x 20 mm Solitaire  X retrieval device, for 2 minutes, the combination of the retrieval device, the microcatheter and the Zoom catheter were retrieved and removed. The superior division continued to be occluded. The superior division was again cannulated with a microcatheter over a micro guidewire as described above. After having verified safe position of the tip of the microcatheter, in the superior division M2 region, a 4 mm x 40 mm Solitaire X retrieval device was deployed in the usual manner. Again with proximal flow arrest in the proximal right ICA, and constant aspiration at the hub of the Zoom catheter at the site of the occluded superior division in the distal right middle cerebral M1 segment over 2 minutes, the combination of the retrieval device, the microcatheter and the  retrieval device were retrieved and removed. Following reverse of flow arrest, there appeared to be modest improved flow in the proximal superior division. It was now noted the inferior division had a near occlusive clot in the proximal aspect. A fourth pass was then made this time using a combination of the Phenom microcatheter, inside of a 6 [place] Catalyst 135 cm catheter advanced over a 0.014 inch standard Synchro micro guidewire to the distal right M1 segment. The micro guidewire was then advanced into the M2 M3 region of the inferior division followed by the microcatheter. The guidewire was removed. A 4 mm x 40 mm Solitaire X retrieval device was deployed with the Catalyst guide catheter advanced at the origin of inferior division and distal to this. Following proximal flow arrest and constant aspiration for approximately 2 minutes at the hub of the Catalyst guide catheter, the combination of the retrieval device, the microcatheter and the Catalyst catheter were retrieved and removed following reversal of flow arrest. Improved flow was now noted into the inferior division proximally through the two main branch points. Following each thrombectomy, strips of fragments were seen intertwined either in the retrieval device, or in the aspiration catheters. 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 It was therefore decided to proceed with placement of a rescue stent in order to prevent occlusion of the major right MCA inferior division branch. The microcatheter was again advanced over a 0.014 inch standard Synchro micro guidewire with a 6 [place] Catalyst guide catheter. The microcatheter was advanced over a micro guidewire without difficulty into the M2 region followed by the removal of the micro guidewire. Again good aspiration obtained from the hub of the microcatheter. This was then connected to continuous heparinized saline infusion.  Measurements performed of the right middle cerebral artery in the mid M1 segment, and also the proximal inferior division distal to the severe stenosis. A 4 mm x 24 mm Neuroform Atlas stent was advanced to the distal end of the microcatheter. The O ring on the delivery microcatheter was then loosened. With slight forward gentle traction with the right hand on the delivery micro guidewire with left hand the delivery microcatheter was gently retrieved unsheathing the distal and then the proximal portion of the stent with excellent coverage at the site of the severe stenosis. The delivery apparatus was removed. A control arteriogram performed through the balloon guide in the distal cervical ICA on the right demonstrated now significantly improved caliber and flow through the right middle cerebral artery the dominant inferior division. There was poor stagnant flow noted in the proximal superior division. Free flow through the stented segment and also the other branch of the right middle cerebral artery except for the  superior division. 8 mg of Integrilin was given intra-arterially in order to prevent intra stent platelet aggregation. A 10 minute post stent arteriogram continued to demonstrate flow through the stented segment in the right MCA distribution except for the non dominant superior division. A TICI 2b revascularization was maintained. The right posterior communicating artery with the right posterior cerebral artery distribution remained unchanged. Throughout the procedure, the patient's blood pressure and neurological status remained stable. A CT of the brain performed following the third pass demonstrated no mass-effect or midline shift with mild element of contrast in the right perisylvian region. The balloon guide was then retrieved and removed. The 8 French 25 cm Pinnacle sheath was then exchanged over a 0.035 inch J-tip guidewire for a short 8 [place] Pinnacle sheath. An arteriogram performed through this  demonstrated spasm in the previously noted tortuous right common iliac artery. There was small amount of contrast noted in the soft tissue at the site of entry of the 8 [place] Pinnacle sheath which completely disappeared on repeat arteriogram performed approximately 3 minutes later. The 8 French sheath was removed and a 7 [place] ExoSeal closure device was placed. Also minimal compression was held for approximately 25 minutes. The distal pulses remained Dopplerable in the dorsalis pedis, and the posterior tibial regions bilaterally. Patient was left intubated on account of the patient's inability to communicate due to difficulty hearing and also her neurological condition. During this time, while pressure was held, and firmness was felt in the region of the pannus on the right side in the right lower quadrant, and also in the right inguinal region. In view of the unstable blood pressure, the patient was sent to CT scan for CT of the pelvis and also of the abdomen and also CT of the brain. CT of the brain revealed no evidence of mass effect or midline shift with no significant change in the right perisylvian hyperattenuation felt to be due to a contrast stain. CT of the abdomen and pelvis revealed no evidence of the overlying skin tightness. Hemodynamically the patient was now stable with blood pressure in the 150s over 80s and the heart rate being regular in the 80s to 90s sinus rhythm. Patient was then transferred to the neuro ICU intubated for post thrombectomy management. IMPRESSION: Status post endovascular revascularization of occluded right middle cerebral M1 segment with 4 passes with different stent retrievers and Penumbra aspiration achieving a TICI 2B revascularization. Status post rescue stent placement from the distal M1 segment into the proximal dominant inferior division of the right middle cerebral artery due to underlying severe intracranial arteriosclerosis. PLAN: Follow-up in the clinic 4 weeks post  discharge. Electronically Signed   By: Luanne Bras M.D.   On: 12/30/2019 17:15   CT HEAD CODE STROKE WO CONTRAST  Result Date: 12/29/2019 CLINICAL DATA:  Code stroke.  Left-sided weakness EXAM: CT HEAD WITHOUT CONTRAST TECHNIQUE: Contiguous axial images were obtained from the base of the skull through the vertex without intravenous contrast. COMPARISON:  None. FINDINGS: Brain: There is no acute intracranial hemorrhage or mass effect. Gray-white differentiation is preserved. Ventricles and sulci are within normal limits in size and configuration. Patchy hypoattenuation in the supratentorial white matter is nonspecific but probably reflects chronic microvascular ischemic changes. Extra-axial dural-based hyperdense lesion along the high left frontal convexity and falx measuring 2.6 x 1.8 x 2.3 cm is consistent with a meningioma. There is no apparent underlying edema. This abuts the superior sagittal sinus. Vascular: No hyperdense vessel. Mild intracranial atherosclerotic calcification  at the skull base. Skull: Unremarkable. Sinuses/Orbits: No acute abnormality. Other: Mastoid air cells are clear. ASPECTS ([place] Stroke Program Early CT Score) - Ganglionic level infarction (caudate, lentiform nuclei, internal capsule, insula, M1-M3 cortex): 7 - Supraganglionic infarction (M4-M6 cortex): 3 Total score (0-10 with 10 being normal): 10 IMPRESSION: No acute intracranial hemorrhage or evidence of acute infarction. ASPECT score is 10. Chronic microvascular ischemic changes. Left frontal meningioma without significant mass effect or underlying edema. Initial results were communicated to Dr. Lorraine Lax at [facility] 6/27/2021by text page via the [facility] messaging system. Electronically Signed   By: Macy Mis M.D.   On: 12/29/2019 18:20   ECHOCARDIOGRAM LIMITED  Result Date: 12/30/2019    ECHOCARDIOGRAM LIMITED REPORT   Patient Name:   Gloria [name] Date of Exam: 12/30/2019 Medical Rec #:  [id number]            Height:       62.0 in Accession #:    [id number]          Weight:       211.0 lb Date of Birth:  Apr 02, 1946            BSA:          1.956 m Patient Age:    34 years            BP:           132/80 mmHg Patient Gender: F                   HR:           64 bpm. Exam Location:  Inpatient Procedure: Limited Color Doppler, Cardiac Doppler and Limited Echo Indications:    stroke 434.91  History:        Patient has prior history of Echocardiogram examinations, most                 recent 11/09/2019. Arrythmias:Atrial Fibrillation.  Sonographer:    Johny Chess Referring Phys: [id number] [place]  1. Left ventricular ejection fraction, by estimation, is >75%. The left ventricle has hyperdynamic function. The left ventricle has no regional wall motion abnormalities. There is severe left ventricular hypertrophy. Left ventricular diastolic parameters are indeterminate.  2. Right ventricular systolic function is normal. The right ventricular size is normal. There is mildly elevated pulmonary artery systolic pressure.  3. Tricuspid valve regurgitation is mild to moderate.  4. The aortic valve is abnormal. Aortic valve regurgitation is mild to moderate. Mild aortic valve sclerosis is present, with no evidence of aortic valve stenosis.  5. The inferior vena cava is normal in size with greater than 50% respiratory variability, suggesting right atrial pressure of 3 mmHg. FINDINGS  Left Ventricle: Left ventricular ejection fraction, by estimation, is >75%. The left ventricle has hyperdynamic function. The left ventricle has no regional wall motion abnormalities. The left ventricular internal cavity size was small. There is severe left ventricular hypertrophy. Right Ventricle: The right ventricular size is normal. No increase in right ventricular wall thickness. Right ventricular systolic function is normal. There is mildly elevated pulmonary artery systolic pressure. The tricuspid regurgitant velocity is 3.14   m/s, and with an assumed right atrial pressure of 3 mmHg, the estimated right ventricular systolic pressure is 33.3 mmHg. Pericardium: Trivial pericardial effusion is present. Mitral Valve: The mitral valve is abnormal. There is mild thickening of the mitral valve leaflet(s). Moderate mitral annular calcification. Tricuspid Valve: The tricuspid valve is grossly normal. Tricuspid  valve regurgitation is mild to moderate. Aortic Valve: The aortic valve is abnormal. Aortic valve regurgitation is mild to moderate. Mild aortic valve sclerosis is present, with no evidence of aortic valve stenosis. Pulmonic Valve: The pulmonic valve was normal in structure. Pulmonic valve regurgitation is not visualized. Aorta: The aortic root and ascending aorta are structurally normal, with no evidence of dilitation. Venous: The inferior vena cava is normal in size with greater than 50% respiratory variability, suggesting right atrial pressure of 3 mmHg.  LEFT VENTRICLE PLAX 2D LVIDd:         3.10 cm LVIDs:         1.60 cm LV PW:         1.70 cm LV IVS:        1.60 cm LVOT diam:     1.90 cm LVOT Area:     2.84 cm  IVC IVC diam: 1.90 cm LEFT ATRIUM         Index LA diam:    4.70 cm 2.40 cm/m   AORTA Ao Root diam: 3.50 cm Ao Asc diam:  3.50 cm TRICUSPID VALVE TR Peak grad:   39.4 mmHg TR Vmax:        314.00 cm/s  SHUNTS Systemic Diam: 1.90 cm Dorris Carnes MD Electronically signed by Dorris Carnes MD Signature Date/Time: 12/30/2019/7:38:12 PM    Final     PHYSICAL EXAM    Temp:  [97.9 F (36.6 C)-99.2 F (37.3 C)] 99.2 F (37.3 C) (06/29 0800) Pulse Rate:  [53-81] 80 (06/29 0800) Resp:  [14-23] 18 (06/29 0800) BP: (60-152)/(28-96) 133/65 (06/29 0800) SpO2:  [100 %] 100 % (06/29 0800) Arterial Line BP: (93-140)/(51-125) 101/96 (06/29 0700) FiO2 (%):  [30 %-40 %] 30 % (06/29 0742) Weight:  [96.7 kg] 96.7 kg (06/29 0355)  General - Well nourished, well developed, intubated off sedation.  Ophthalmologic - fundi not visualized due  to noncooperation.  Cardiovascular - irregularly irregular heart rate and rhythm.  Neuro - intubated off sedation, eyes closed, but easily open and remained open, but not following commands. With forced eye opening, eyes right forced gaze,  blinking to visual threat on the right but not on the left, not tracking, PERRL. Corneal reflex present b/l, gag and cough present. Breathing over the vent.  Facial symmetry not able to test due to ET tube.  Tongue protrusion not cooperative. Purposeful movement of RUE against gravity. On pain stimulation, localizing to pain with RUE, withdraw 2/5 RLE, no movement of LUE, mild/slight withdraw LLE. DTR 1+ and left positive babinski. Sensation, coordination and gait not tested.  ASSESSMENT/PLAN Gloria [name] is a 74 y.o. female with history of bilateral ureteral calculi, HTN, Atrial Fibrillation not on anticoagulation (not on [facility] due to high risk of falls), who presents with left sided hemiplegia and right gaze preference. CTH in the ED revealed left frontal meningioma, CTA with a right M1 occlusion. She received IVTPA on 12/29/19 at 1830 w/LKW 1645 and was taken for mechanical thrombectomy with resulting TICI 2B revascularization w/placement of recue stent in the right MCA.  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 Small vessel disease. Atrophy. ASPECTS 10.     CTA head & neck R M1 occlusion, fetal PCA on the right  Cerebral angio R M1 occlusion w/p TICI 2b  revascularization. 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    Post IR CT hyperdensity R subarachnoid space, stable L parafalcine meningioma  MRI  R occipital infarct. R insular and frontal operculum infarct. Small scattered R frontal and parietal infarcts.  MRA  fetal R PCA. Missing superior division R MCA. R MCA stent,  inferior division with flow.  LE Doppler  No DVT  2D Echo EF > 75%, severe LVH. Mild to moderate TVR.  LDL 90  HgbA1c 5.2  SCDs for VTE prophylaxis  aspirin 325 mg daily prior to admission, now on aspirin 81 mg daily and Brilinta (ticagrelor) 90 mg bid given stent placement. Plant to continue Brilinta at d/c with [facility] if can tolerate at that time, o/w continue DAPT alone  Therapy recommendations:  pending   Disposition:  pending   Acute Respiratory Failure  intubated for IR, left intubated for airway protection  Sedated now off sedation  On weaning  CCM on board  Plan to extubate today  Hematoma R groin  pseudoanerysm w/ extravasation following IR  CT abd & pelvis R inguinal hematoma into abdominal wall. Stable B ureteral stents and B adrenal adenoma. Aortic atherosclerosis.    Hgb 9.2->8.8->8.4->7.8  [place] right groin - no pseudoaneurysm  Hematuria  S/p B ureteral stents exchange 6/4 d/t B renal calculi  ABD CT Mild B hydronephrosis, stents in place  Hgb 9.2->8.8->8.4->7.8  Cre 1.9->1.5->1.44->1.88  Urology consulted, appreciate help  Avoid foley given stents in ureter  Plans for stent removal later this week - hematuria should resolve w/ stent removal  Atrial Fibrillation  Home anticoagulation:  none d/t high fall risk  Not stable for AC at this time  Plan with AC at d/c if anemia stable   Hypertension  Home meds:  Coreg 6.25 bid  Stable   SBP goal now < 180  Long-term BP goal normotensive  Hyperlipidemia  Home meds:  No statin  LDL 90, goal < 70  lipitor 40 now  Continue statin at discharge  Dysphagia  Secondary to stroke  NPO  Speech on board  Cortrak placed  On TF  Dietitian consult      CKD stage IIIab  Cre 1.49->1.76->1.50->1.88  Could be due to contrast nephrotoxicity  On IVF and TF  Continue monitoring BMP  Other Stroke Risk Factors  Advanced age  Obesity, Body mass index is 38.59 kg/m.,  recommend weight loss, diet and exercise as appropriate   Other Active Problems  urinary incontinence on mirabegron PTA  [facility] day # 2  Patient continues to be critically ill for the last 24 hours, has developed worsening anemia and kidney function, continues to have respiratory failure on ventilation, and I ordered TF and changed IVF, reviewed MRI and updated brother over the phone. I discussed with Dr. Lynetta Mare.   This patient is critically ill due to right MCA stroke, right MCA occlusion, status post TPA and thrombectomy, intracranial stent, hematuria A. fib, respirate failure and at significant risk of neurological worsening, death form recurrent stroke, hemorrhagic conversion, seizure, heart failure, hypotension. This patient's care requires constant monitoring of vital signs, hemodynamics, respiratory and cardiac monitoring, review of multiple databases, neurological assessment, discussion with family, other specialists and medical decision making of high complexity. I spent 40 minutes of neurocritical care time in the care of this patient. I had long discussion with brother at bedside, updated pt current condition, treatment plan and potential prognosis, and answered all the questions. Brother  expressed understanding and appreciation.    Rosalin Hawking, MD PhD Stroke Neurology 12/31/2019 9:43 AM    To contact Stroke Continuity provider, please refer to [email or web address] After hours, contact General Neurology

## 2019-12-31 NOTE — Progress Notes (Signed)
Referring Physician(s): Code Stroke- Aroor, Sushanth R  Supervising Physician: Luanne Bras  Patient Status:  [name] - In-pt  Chief Complaint: None- intubated with sedation  Subjective:  History of acute CVA s/p cerebral arteriogram with emergent mechanical thrombectomy of right MCA M1 occlusion achieving a TICI 2b revascularization, along with revascularization of right MCA stenosis using rescue stent placement via right femoral approach 12/29/2019 by Dr. Estanislado Pandy. Patient laying in bed intubated with sedation. Can spontaneously move right side, left side withdraws from pain. Right groin puncture site c/d/i- was concern for hematoma; CT revealed no extension into retroperitoneal space.  MR/MRA brain/head 12/30/2019: 1. Acute infarction of the right occipital cortex. Fetal origin right PCA with poor flow in the distal branch vessels. 2. Acute infarction of the right insular region and frontal operculum. Missing superior division right MCA. 3. Small scattered other acute infarctions in the right frontal and parietal regions. 4. Swelling but no mass effect. No intraparenchymal hematoma. Few small foci of petechial blood versus vascular susceptibility signal. 5. Right MCA stent. Missing superior division as noted above. Inferior division shows flow.   Allergies: Aspartame and phenylalanine, Azithromycin, Bee venom, Cortizone-5 [hydrocortisone], Olmesartan, Other, Penicillins, Valsartan, and Latex  Medications: Prior to Admission medications   Medication Sig Start Date End Date Taking? Authorizing Provider  aspirin 325 MG tablet Take 325 mg by mouth daily.   Yes [provider]  carvedilol (COREG) 6.25 MG tablet Take 6.25 mg by mouth 2 (two) times daily. 11/08/19  Yes [provider]  furosemide (LASIX) 20 MG tablet Take 1 tablet (20 mg total) by mouth as needed for fluid. Patient taking differently: Take 20 mg by mouth daily as needed for fluid.  11/15/19  Yes  Pokhrel, Laxman, MD  magnesium oxide (MAG-OX) 400 MG tablet Take 400 mg by mouth 2 (two) times daily. 11/12/19  Yes [provider]  traMADol (ULTRAM) 50 MG tablet Take 1-2 tablets (50-100 mg total) by mouth every 6 (six) hours as needed for moderate pain. 12/06/19  Yes Ardis Hughs, MD  mirabegron ER (MYRBETRIQ) 25 MG TB24 tablet Take 1 tablet (25 mg total) by mouth daily. 11/22/19   Ardis Hughs, MD     Vital Signs: BP 137/60   Pulse 74   Temp 99.2 F (37.3 C) (Axillary)   Resp 19   Wt 213 lb 3 oz (96.7 kg)   SpO2 100%   BMI 38.99 kg/m   Physical Exam Vitals and nursing note reviewed.  Constitutional:      General: She is not in acute distress.    Comments: Intubated with sedation.   Pulmonary:     Effort: Pulmonary effort is normal. No respiratory distress.     Comments: Intubated with sedation.  Skin:    General: Skin is warm and dry.     Comments: Right groin puncture site soft with small hematoma and ecchymosis lateral to puncture site, no signs of active bleeding noted.  Neurological:     Comments: Intubated with sedation.  She opens eyes to touch (deaf) and quickly falls back asleep. PERRL bilaterally. Right gaze preference. Can spontaneously move right side, left side withdraws from pain. Distal pulses (DPs) palpable bilaterally with Doppler.      Imaging: CT ABDOMEN PELVIS WO CONTRAST  Result Date: 12/29/2019 CLINICAL DATA:  Right inguinal hematoma, recent neuro interventional procedure, assess for retroperitoneal hematoma EXAM: CT ABDOMEN AND PELVIS WITHOUT CONTRAST TECHNIQUE: Multidetector CT imaging of the abdomen and pelvis was performed following  the standard protocol without IV contrast. COMPARISON:  11/08/2019 FINDINGS: Lower chest: Hypoventilatory changes are seen within the dependent lower lobes. Heart is enlarged with trace pericardial fluid unchanged. Hepatobiliary: Gallbladder surgically absent. Unenhanced imaging the liver  demonstrates no gross abnormalities. Pancreas: Unremarkable. No pancreatic ductal dilatation or surrounding inflammatory changes. Spleen: Normal in size without focal abnormality. Adrenals/Urinary Tract: Excreted contrast is seen within the bilateral kidneys related to previous neuro interventional procedure. Bilateral renal cortical thinning is noted. Stable bilateral adrenal adenoma. Bilateral ureteral stents extend from the renal pelves into the bladder lumen. No filling defects within the bladder. Stomach/Bowel: Enteric catheter extends into the gastric lumen. No bowel obstruction or ileus. No bowel wall thickening or inflammatory change. Vascular/Lymphatic: Aortic atherosclerosis. No enlarged abdominal or pelvic lymph nodes. Reproductive: Uterus and bilateral adnexa are unremarkable. Other: Hematoma is seen within the right inguinal region extending along the inguinal crease into the pannus of the right lower quadrant abdominal wall. This measures up to 3.7 cm in thickness. There is no evidence of extension into the retroperitoneal space. At the time of the exam, extrinsic compression is being held at the right groin. There is no free fluid or free gas within the peritoneal cavity. Fat containing hernia again noted within the left lower quadrant abdominal wall. No bowel herniation. Musculoskeletal: No acute or destructive bony lesions. Reconstructed images demonstrate no additional findings. IMPRESSION: 1. Hematoma within the right inguinal region extending along the inguinal crease into the pannus of the right lower quadrant abdominal wall. No extension into the retroperitoneal space. 2. Bilateral ureteral stents as above. 3. Stable bilateral adrenal adenoma. 4. Aortic Atherosclerosis (ICD10-I70.0). Electronically Signed   By: Randa Ngo M.D.   On: 12/29/2019 23:48   CT Code Stroke CTA Head W/WO contrast  Result Date: 12/29/2019 CLINICAL DATA:  Left-sided weakness, code stroke follow-up EXAM: CT  ANGIOGRAPHY HEAD AND NECK TECHNIQUE: Multidetector CT imaging of the head and neck was performed using the standard protocol during bolus administration of intravenous contrast. Multiplanar CT image reconstructions and MIPs were obtained to evaluate the vascular anatomy. Carotid stenosis measurements (when applicable) are obtained utilizing NASCET criteria, using the distal internal carotid diameter as the denominator. CONTRAST:  51mL OMNIPAQUE IOHEXOL 350 MG/ML SOLN COMPARISON:  None. FINDINGS: CTA NECK Aortic arch: Minimal calcified plaque along the aortic arch. Mild calcified plaque at the great vessel origins, which are patent. Right carotid system: Patent. Common carotid has a retropharyngeal course. Mild calcified plaque at the ICA origin causing minimal stenosis. Left carotid system: Patent. Common carotid has a retropharyngeal course. Mild calcified plaque at the ICA origin causing minimal stenosis. Vertebral arteries: Patent. Left vertebral artery is dominant. Suspected stenosis of the proximal right vertebral artery. Skeleton: Degenerative changes of the cervical spine. Other neck: No mass or adenopathy. Upper chest: Enlargement of the main pulmonary artery suggesting pulmonary arterial hypertension. Review of the MIP images confirms the above findings CTA HEAD Anterior circulation: Intracranial internal carotid arteries are patent with calcified plaque causing mild stenosis. Anterior cerebral arteries are patent. Left A1 ACA is dominant with diminutive or absent right A1 segment. There is occlusion of the mid right M1 MCA. There is preserved opacification of the more distal right MCA territory. Left MCA is patent. Posterior circulation: Intracranial vertebral arteries are patent. Basilar artery is patent. Posterior cerebral arteries are patent. There are posterior communicating arteries present bilaterally with fetal origin of the right PCA. Venous sinuses: Patent as allowed by contrast bolus timing.  Review of the  MIP images confirms the above findings IMPRESSION: Occlusion of the right M1 MCA. However, there is good filling of the more distal MCA territory. No hemodynamically significant stenosis in the neck. These results were communicated to Dr. Lorraine Lax at [place] 6/27/2021by text page via the [facility] messaging system. Electronically Signed   By: Macy Mis M.D.   On: 12/29/2019 19:10   CT HEAD WO CONTRAST  Result Date: 12/29/2019 CLINICAL DATA:  Stroke follow-up.  Status post thrombectomy EXAM: CT HEAD WITHOUT CONTRAST TECHNIQUE: Contiguous axial images were obtained from the base of the skull through the vertex without intravenous contrast. COMPARISON:  Head CT 12/29/2019 FINDINGS: Brain: There is hyperdense material in the right subarachnoid space, overlying the frontal operculum and within the sylvian fissure. No other extra-axial collection. No midline shift or other mass effect. No intraparenchymal hemorrhage. 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 Vascular: Status post right MCA stent placement. Skull: Normal. Negative for fracture or focal lesion. Sinuses/Orbits: No acute finding. Other: None. IMPRESSION: 1. Hyperdense material in the right subarachnoid space, overlying the frontal operculum and within the Sylvian fissure. This is most likely contrast staining, but follow-up studies will be necessary to differentiate from subarachnoid hemorrhage. 2. Unchanged left parafalcine meningioma. Electronically Signed   By: Ulyses Jarred M.D.   On: 12/29/2019 23:49   CT Code Stroke CTA Neck W/WO contrast  Result Date: 12/29/2019 CLINICAL DATA:  Left-sided weakness, code stroke follow-up EXAM: CT ANGIOGRAPHY HEAD AND NECK TECHNIQUE: Multidetector CT imaging of the head and neck was performed using the standard protocol during bolus administration of intravenous contrast. Multiplanar CT image reconstructions and MIPs were obtained to evaluate the vascular anatomy. Carotid stenosis measurements (when  applicable) are obtained utilizing NASCET criteria, using the distal internal carotid diameter as the denominator. CONTRAST:  8mL OMNIPAQUE IOHEXOL 350 MG/ML SOLN COMPARISON:  None. FINDINGS: CTA NECK Aortic arch: Minimal calcified plaque along the aortic arch. Mild calcified plaque at the great vessel origins, which are patent. Right carotid system: Patent. Common carotid has a retropharyngeal course. Mild calcified plaque at the ICA origin causing minimal stenosis. Left carotid system: Patent. Common carotid has a retropharyngeal course. Mild calcified plaque at the ICA origin causing minimal stenosis. Vertebral arteries: Patent. Left vertebral artery is dominant. Suspected stenosis of the proximal right vertebral artery. Skeleton: Degenerative changes of the cervical spine. Other neck: No mass or adenopathy. Upper chest: Enlargement of the main pulmonary artery suggesting pulmonary arterial hypertension. Review of the MIP images confirms the above findings CTA HEAD Anterior circulation: Intracranial internal carotid arteries are patent with calcified plaque causing mild stenosis. Anterior cerebral arteries are patent. Left A1 ACA is dominant with diminutive or absent right A1 segment. There is occlusion of the mid right M1 MCA. There is preserved opacification of the more distal right MCA territory. Left MCA is patent. Posterior circulation: Intracranial vertebral arteries are patent. Basilar artery is patent. Posterior cerebral arteries are patent. There are posterior communicating arteries present bilaterally with fetal origin of the right PCA. Venous sinuses: Patent as allowed by contrast bolus timing. Review of the MIP images confirms the above findings IMPRESSION: Occlusion of the right M1 MCA. However, there is good filling of the more distal MCA territory. No hemodynamically significant stenosis in the neck. These results were communicated to Dr. Lorraine Lax at [place] 6/27/2021by text page via the [facility]  messaging system. Electronically Signed   By: Macy Mis M.D.   On: 12/29/2019 19:10   MR ANGIO HEAD WO CONTRAST  Result Date: 12/30/2019 CLINICAL DATA:  Status post right M1 occlusion with intervention EXAM: MRI HEAD WITHOUT CONTRAST MRA HEAD WITHOUT CONTRAST TECHNIQUE: Multiplanar, multiecho pulse sequences of the brain and surrounding structures were obtained without intravenous contrast. Angiographic images of the head were obtained using MRA technique without contrast. COMPARISON:  Multiple CT studies done yesterday. FINDINGS: MRI HEAD FINDINGS Brain: Diffusion imaging shows acute infarction affecting the right occipital cortex. Acute infarction affects the right insular region and frontal operculum. Small foci of acute infarction elsewhere in the right frontal and parietal regions. Areas of infarction show mild swelling but there is no mass effect. Small focus of susceptibility in the right sylvian fissure region may be intravascular. Few small foci of susceptibility in the right parietal region also suspected to be intravascular. No sign of parenchymal hematoma. No hydrocephalus. No extra-axial collection. Mild chronic small-vessel ischemic changes elsewhere throughout the cerebral hemispheric white matter. 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 Vascular: Stent in the right M1 region. Skull and upper cervical spine: Negative Sinuses/Orbits: Clear/normal Other: None MRA HEAD FINDINGS Both internal carotid arteries show antegrade flow through the skull base. On the left, there is supply in the left MCA territory and both anterior cerebral artery territories. Moderate atherosclerotic irregularity of the MCA branches. On the right, there is signal loss related to the right MCA stent. More distal MCA branch vessels do show supply, with absence of the superior division. Right vertebral artery terminates in PICA. Left  vertebral artery supplies the basilar. 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 Left PCA arises from the basilar tip. Distal vessel atherosclerotic irregularity. Right PCA arises from the right carotid and shows poor supply of the distal branches. 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 Fetal origin right PCA with poor flow in the distal branch vessels. Acute infarction of the right insular region and frontal operculum. Missing superior division right MCA. Small scattered other acute infarctions in the right frontal and parietal regions. Swelling but no mass effect. No intraparenchymal hematoma. Few small foci of petechial blood versus vascular susceptibility signal. Right MCA stent. Missing superior division as noted above. Inferior division shows flow. Electronically Signed   By: Nelson Chimes M.D.   On: 12/30/2019 19:21   MR BRAIN WO CONTRAST  Result Date: 12/30/2019 CLINICAL DATA:  Status post right M1 occlusion with intervention EXAM: MRI HEAD WITHOUT CONTRAST MRA HEAD WITHOUT CONTRAST TECHNIQUE: Multiplanar, multiecho pulse sequences of the brain and surrounding structures were obtained without intravenous contrast. Angiographic images of the head were obtained using MRA technique without contrast. COMPARISON:  Multiple CT studies done yesterday. FINDINGS: MRI HEAD FINDINGS Brain: Diffusion imaging shows acute infarction affecting the right occipital cortex. Acute infarction affects the right insular region and frontal operculum. Small foci of acute infarction elsewhere in the right frontal and parietal regions. Areas of infarction show mild swelling but there is no mass effect. Small focus of susceptibility in the right sylvian fissure region may be intravascular. Few small foci of susceptibility in the right parietal region also suspected to be intravascular. No sign of parenchymal hematoma. No hydrocephalus. No extra-axial collection. Mild chronic small-vessel  ischemic changes elsewhere throughout the cerebral hemispheric white matter. 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 Vascular: Stent in the right M1 region. Skull and upper cervical spine: Negative Sinuses/Orbits: Clear/normal Other: None MRA HEAD FINDINGS Both internal  carotid arteries show antegrade flow through the skull base. On the left, there is supply in the left MCA territory and both anterior cerebral artery territories. Moderate atherosclerotic irregularity of the MCA branches. On the right, there is signal loss related to the right MCA stent. More distal MCA branch vessels do show supply, with absence of the superior division. Right vertebral artery terminates in PICA. Left vertebral artery supplies the basilar. 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 Left PCA arises from the basilar tip. Distal vessel atherosclerotic irregularity. Right PCA arises from the right carotid and shows poor supply of the distal branches. 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 Fetal origin right PCA with poor flow in the distal branch vessels. Acute infarction of the right insular region and frontal operculum. Missing superior division right MCA. Small scattered other acute infarctions in the right frontal and parietal regions. Swelling but no mass effect. No intraparenchymal hematoma. Few small foci of petechial blood versus vascular susceptibility signal. Right MCA stent. Missing superior division as noted above. Inferior division shows flow. Electronically Signed   By: Nelson Chimes M.D.   On: 12/30/2019 19:21   IR Intra Cran Stent  Result Date: 12/31/2019 INDICATION: New onset left-sided hemiplegia, right gaze deviation. Occluded right middle cerebral M1 segment on CT angiogram of the head and neck. EXAM: 1. EMERGENT LARGE VESSEL OCCLUSION THROMBOLYSIS (anterior CIRCULATION) COMPARISON:  CT angiogram  of the head and neck of June, 27, 2021. MEDICATIONS: Vancomycin 1 g IV was administered within 1 hour of the procedure. ANESTHESIA/SEDATION: General anesthesia CONTRAST:  Isovue 300 approximately 170 mL FLUOROSCOPY TIME:  Fluoroscopy Time: 131 minutes 12 seconds (4835 mGy). COMPLICATIONS: None immediate. TECHNIQUE: Following a full explanation of the procedure along with the potential associated complications, an informed witnessed consent was obtained the patient's son. The risks of intracranial hemorrhage of 10%, worsening neurological deficit, ventilator dependency, death and inability to revascularize were all reviewed in detail with the patient's son. The patient was then put under general anesthesia by the Department of Anesthesiology at [name]. The right groin was prepped and draped in the usual sterile fashion. Thereafter using modified Seldinger technique, transfemoral access into the right common femoral artery was obtained without difficulty. Over a 0.035 inch guidewire an 8 French 25 cm Pinnacle sheath was inserted. Through this, and also over a 0.035 inch guidewire a combination of a select 5 French 125 cm Simmons 2 catheter inside of a 95 cm 087 balloon guide catheter was advanced without difficulty to the right common carotid artery. The guidewire and the select catheter were removed. Good aspiration was obtained from the hub of the balloon guide catheter just proximal to the right common carotid artery bifurcation. Arteriogram was then performed centered extra cranially and intracranially. FINDINGS: The right common carotid arteriogram demonstrates the right external carotid artery and its major branches to be widely patent. The right internal carotid artery at the bulb demonstrates a smooth shallow plaque. Moderate tortuosity was noted of the proximal right internal carotid artery. 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 Distal to this the cervical petrous junction demonstrates normal opacification. Distal to this the petrous, cavernous, and the cavernous segments demonstrate patency as does the supraclinoid segment. Opacification is seen of the right posterior communicating artery opacifying the right  posterior cerebral distribution. The right middle cerebral artery demonstrates complete angiographic occlusion at the origin of the anterior temporal branch. Focal areas of caliber irregularity are seen of the proximal right middle cerebral artery. PROCEDURE: Through the 087 balloon guide catheter in the proximal right internal carotid artery, a Zoom 071 135 cm catheter inside of which was an 021 160 cm Phenom microcatheter was advanced over a 0.014 inch standard Synchro micro guidewire gingerly through the right internal carotid artery in the neck and into the supraclinoid right ICA. The micro guidewire was then gently manipulated with a torque device and advanced into the inferior division M2 M3 region of the right middle cerebral artery followed by the microcatheter. The guidewire was removed. Good aspiration was obtained from the hub of the microcatheter. Gentle contrast injection demonstrated safe position of tip of the microcatheter. A 5 mm x 37 mm Embotrap retrieval device was then advanced to the distal end of the microcatheter. The O ring on the delivery micro guidewire was loosened. With slight forward gentle traction with the right hand on the delivery micro guidewire, with the left hand the retrieval device was deployed. The 071 Zoom catheter was then advanced into the right middle cerebral artery engaging the occluded right middle cerebral artery. With constant flow arrest in the right internal carotid artery, and constant aspiration using a Penumbra device  at the hub of the 071 Zoom catheter for approximately 3 minutes, the combination of the retrieval device, the microcatheter, and the Zoom aspiration catheter were retrieved and removed. Following reversal of flow arrest, a control arteriogram performed through the balloon guide catheter in the right internal carotid artery demonstrates revascularization of the right middle cerebral artery and also of the superior and inferior divisions. The anterior temporal branch remains patent. A TICI 2B revascularization was achieved. This also unmasked an irregular filling defect at the right middle cerebral artery terminus at the trifurcation region with flow noted in the distal inferior division. A second pass was then made again with the combination of the 071 Zoom catheter advanced over an 021 160 cm Phenom microcatheter over a 0.014 inch standard Synchro micro guidewire. Again access was obtained into the distal M2 M3 region of the inferior division with the micro guidewire then followed by the microcatheter. The guidewire was removed. Again safe position of the tip of the microcatheter was confirmed and connected to continuous heparinized saline infusion. A 4 mm x 40 mm Solitaire X retrieval device was then deployed in the manner described above. Again with proximal flow arrest in the right internal carotid artery proximally, and constant aspiration via the Zoom catheter which was now imbedded in the occluded right middle cerebral artery at its trifurcation region over approximately 2-1/2 minutes, the combination of the retrieval device, the microcatheter, and the Zoom catheter was retrieved and removed. Following reversal of flow arrest, a control arteriogram performed through the right internal carotid artery demonstrated improved opacification of the right MCA trifurcation branches. There continued be occluded superior division. The combination of the microcatheter, inside a 071 Zoom aspiration catheter was again  advanced to the right middle cerebral artery over a 0.014 inch standard Synchro micro guidewire, which was now advanced into the superior division of the right middle cerebral artery emanating at the MCA trifurcation region. The microcatheter was advanced to the M2 region over a micro guidewire. The micro guidewire was removed. Good aspiration obtained from the hub of the microcatheter. Again with flow arrest in the right internal carotid  artery proximally, and constant aspiration with the Penumbra aspiration device at the hub of the aspiration catheter following deployment of a 3 mm x 20 mm Solitaire X retrieval device, for 2 minutes, the combination of the retrieval device, the microcatheter and the Zoom catheter were retrieved and removed. The superior division continued to be occluded. The superior division was again cannulated with a microcatheter over a micro guidewire as described above. After having verified safe position of the tip of the microcatheter, in the superior division M2 region, a 4 mm x 40 mm Solitaire X retrieval device was deployed in the usual manner. Again with proximal flow arrest in the proximal right ICA, and constant aspiration at the hub of the Zoom catheter at the site of the occluded superior division in the distal right middle cerebral M1 segment over 2 minutes, the combination of the retrieval device, the microcatheter and the retrieval device were retrieved and removed. Following reverse of flow arrest, there appeared to be modest improved flow in the proximal superior division. It was now noted the inferior division had a near occlusive clot in the proximal aspect. A fourth pass was then made this time using a combination of the Phenom microcatheter, inside of a 6 [place] Catalyst 135 cm catheter advanced over a 0.014 inch standard Synchro micro guidewire to the distal right M1 segment. The micro guidewire was then advanced into the M2 M3 region of the inferior division followed by  the microcatheter. The guidewire was removed. A 4 mm x 40 mm Solitaire X retrieval device was deployed with the Catalyst guide catheter advanced at the origin of inferior division and distal to this. Following proximal flow arrest and constant aspiration for approximately 2 minutes at the hub of the Catalyst guide catheter, the combination of the retrieval device, the microcatheter and the Catalyst catheter were retrieved and removed following reversal of flow arrest. Improved flow was now noted into the inferior division proximally through the two main branch points. Following each thrombectomy, strips of fragments were seen intertwined either in the retrieval device, or in the aspiration catheters. 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 It was therefore decided to proceed with placement of a rescue stent in order to prevent occlusion of the major right MCA inferior division branch. The microcatheter was again advanced over a 0.014 inch standard Synchro micro guidewire with a 6 [place] Catalyst guide catheter. The microcatheter was advanced over a micro guidewire without difficulty into the M2 region followed by the removal of the micro guidewire. Again good aspiration obtained from the hub of the microcatheter. This was then connected to continuous heparinized saline infusion. Measurements performed of the right middle cerebral artery in the mid M1 segment, and also the proximal inferior division distal to the severe stenosis. A 4 mm x 24 mm Neuroform Atlas stent was advanced to the distal end of the microcatheter. The O ring on the delivery microcatheter was then loosened. With slight forward gentle traction with the right hand on the delivery micro guidewire with left hand the delivery microcatheter was gently retrieved unsheathing the distal and then the proximal portion of the stent with excellent coverage at the site of the severe  stenosis. The delivery apparatus was removed. A control arteriogram performed through the balloon guide in the distal cervical ICA on the right demonstrated now significantly improved caliber and flow through the right middle cerebral artery the dominant inferior division. There was poor stagnant  flow noted in the proximal superior division. Free flow through the stented segment and also the other branch of the right middle cerebral artery except for the superior division. 8 mg of Integrilin was given intra-arterially in order to prevent intra stent platelet aggregation. A 10 minute post stent arteriogram continued to demonstrate flow through the stented segment in the right MCA distribution except for the non dominant superior division. A TICI 2b revascularization was maintained. The right posterior communicating artery with the right posterior cerebral artery distribution remained unchanged. Throughout the procedure, the patient's blood pressure and neurological status remained stable. A CT of the brain performed following the third pass demonstrated no mass-effect or midline shift with mild element of contrast in the right perisylvian region. The balloon guide was then retrieved and removed. The 8 French 25 cm Pinnacle sheath was then exchanged over a 0.035 inch J-tip guidewire for a short 8 [place] Pinnacle sheath. An arteriogram performed through this demonstrated spasm in the previously noted tortuous right common iliac artery. There was small amount of contrast noted in the soft tissue at the site of entry of the 8 [place] Pinnacle sheath which completely disappeared on repeat arteriogram performed approximately 3 minutes later. The 8 French sheath was removed and a 7 [place] ExoSeal closure device was placed. Also minimal compression was held for approximately 25 minutes. The distal pulses remained Dopplerable in the dorsalis pedis, and the posterior tibial regions bilaterally. Patient was left intubated on  account of the patient's inability to communicate due to difficulty hearing and also her neurological condition. During this time, while pressure was held, and firmness was felt in the region of the pannus on the right side in the right lower quadrant, and also in the right inguinal region. In view of the unstable blood pressure, the patient was sent to CT scan for CT of the pelvis and also of the abdomen and also CT of the brain. CT of the brain revealed no evidence of mass effect or midline shift with no significant change in the right perisylvian hyperattenuation felt to be due to a contrast stain. CT of the abdomen and pelvis revealed no evidence of the overlying skin tightness. Hemodynamically the patient was now stable with blood pressure in the 150s over 80s and the heart rate being regular in the 80s to 90s sinus rhythm. Patient was then transferred to the neuro ICU intubated for post thrombectomy management. IMPRESSION: Status post endovascular revascularization of occluded right middle cerebral M1 segment with 4 passes with different stent retrievers and Penumbra aspiration achieving a TICI 2B revascularization. Status post rescue stent placement from the distal M1 segment into the proximal dominant inferior division of the right middle cerebral artery due to underlying severe intracranial arteriosclerosis. PLAN: Follow-up in the clinic 4 weeks post discharge. Electronically Signed   By: Luanne Bras M.D.   On: 12/30/2019 17:15   IR CT Head Ltd  Result Date: 12/31/2019 INDICATION: New onset left-sided hemiplegia, right gaze deviation. Occluded right middle cerebral M1 segment on CT angiogram of the head and neck. EXAM: 1. EMERGENT LARGE VESSEL OCCLUSION THROMBOLYSIS (anterior CIRCULATION) COMPARISON:  CT angiogram of the head and neck of June, 27, 2021. MEDICATIONS: Vancomycin 1 g IV was administered within 1 hour of the procedure. ANESTHESIA/SEDATION: General anesthesia CONTRAST:  Isovue 300  approximately 170 mL FLUOROSCOPY TIME:  Fluoroscopy Time: 131 minutes 12 seconds (4835 mGy). COMPLICATIONS: None immediate. TECHNIQUE: Following a full explanation of the procedure along with the potential associated complications,  an informed witnessed consent was obtained the patient's son. The risks of intracranial hemorrhage of 10%, worsening neurological deficit, ventilator dependency, death and inability to revascularize were all reviewed in detail with the patient's son. The patient was then put under general anesthesia by the Department of Anesthesiology at [facility]. The right groin was prepped and draped in the usual sterile fashion. Thereafter using modified Seldinger technique, transfemoral access into the right common femoral artery was obtained without difficulty. Over a 0.035 inch guidewire an 8 French 25 cm Pinnacle sheath was inserted. Through this, and also over a 0.035 inch guidewire a combination of a select 5 French 125 cm Simmons 2 catheter inside of a 95 cm 087 balloon guide catheter was advanced without difficulty to the right common carotid artery. The guidewire and the select catheter were removed. Good aspiration was obtained from the hub of the balloon guide catheter just proximal to the right common carotid artery bifurcation. Arteriogram was then performed centered extra cranially and intracranially. FINDINGS: The right common carotid arteriogram demonstrates the right external carotid artery and its major branches to be widely patent. The right internal carotid artery at the bulb demonstrates a smooth shallow plaque. Moderate tortuosity was noted of the proximal right internal carotid artery. 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 Distal to this  the cervical petrous junction demonstrates normal opacification. Distal to this the petrous, cavernous, and the cavernous segments demonstrate patency as does the supraclinoid segment. Opacification is seen of the right posterior communicating artery opacifying the right posterior cerebral distribution. The right middle cerebral artery demonstrates complete angiographic occlusion at the origin of the anterior temporal branch. Focal areas of caliber irregularity are seen of the proximal right middle cerebral artery. PROCEDURE: Through the 087 balloon guide catheter in the proximal right internal carotid artery, a Zoom 071 135 cm catheter inside of which was an 021 160 cm Phenom microcatheter was advanced over a 0.014 inch standard Synchro micro guidewire gingerly through the right internal carotid artery in the neck and into the supraclinoid right ICA. The micro guidewire was then gently manipulated with a torque device and advanced into the inferior division M2 M3 region of the right middle cerebral artery followed by the microcatheter. The guidewire was removed. Good aspiration was obtained from the hub of the microcatheter. Gentle contrast injection demonstrated safe position of tip of the microcatheter. A 5 mm x 37 mm Embotrap retrieval device was then advanced to the distal end of the microcatheter. The O ring on the delivery micro guidewire was loosened. With slight forward gentle traction with the right hand on the delivery micro guidewire, with the left hand the retrieval device was deployed. The 071 Zoom catheter was then advanced into the right middle cerebral artery engaging the occluded right middle cerebral artery. With constant flow arrest in the right internal carotid artery, and constant aspiration using a Penumbra device at the hub of the 071 Zoom catheter for approximately 3 minutes, the combination of the retrieval device, the microcatheter, and the Zoom aspiration catheter were retrieved and  removed. Following reversal of flow arrest, a control arteriogram performed through the balloon guide catheter in the right internal carotid artery demonstrates revascularization of the right middle cerebral artery and also of the superior and inferior divisions. The anterior temporal branch remains  patent. A TICI 2B revascularization was achieved. This also unmasked an irregular filling defect at the right middle cerebral artery terminus at the trifurcation region with flow noted in the distal inferior division. A second pass was then made again with the combination of the 071 Zoom catheter advanced over an 021 160 cm Phenom microcatheter over a 0.014 inch standard Synchro micro guidewire. Again access was obtained into the distal M2 M3 region of the inferior division with the micro guidewire then followed by the microcatheter. The guidewire was removed. Again safe position of the tip of the microcatheter was confirmed and connected to continuous heparinized saline infusion. A 4 mm x 40 mm Solitaire X retrieval device was then deployed in the manner described above. Again with proximal flow arrest in the right internal carotid artery proximally, and constant aspiration via the Zoom catheter which was now imbedded in the occluded right middle cerebral artery at its trifurcation region over approximately 2-1/2 minutes, the combination of the retrieval device, the microcatheter, and the Zoom catheter was retrieved and removed. Following reversal of flow arrest, a control arteriogram performed through the right internal carotid artery demonstrated improved opacification of the right MCA trifurcation branches. There continued be occluded superior division. The combination of the microcatheter, inside a 071 Zoom aspiration catheter was again advanced to the right middle cerebral artery over a 0.014 inch standard Synchro micro guidewire, which was now advanced into the superior division of the right middle cerebral artery  emanating at the MCA trifurcation region. The microcatheter was advanced to the M2 region over a micro guidewire. The micro guidewire was removed. Good aspiration obtained from the hub of the microcatheter. Again with flow arrest in the right internal carotid artery proximally, and constant aspiration with the Penumbra aspiration device at the hub of the aspiration catheter following deployment of a 3 mm x 20 mm Solitaire X retrieval device, for 2 minutes, the combination of the retrieval device, the microcatheter and the Zoom catheter were retrieved and removed. The superior division continued to be occluded. The superior division was again cannulated with a microcatheter over a micro guidewire as described above. After having verified safe position of the tip of the microcatheter, in the superior division M2 region, a 4 mm x 40 mm Solitaire X retrieval device was deployed in the usual manner. Again with proximal flow arrest in the proximal right ICA, and constant aspiration at the hub of the Zoom catheter at the site of the occluded superior division in the distal right middle cerebral M1 segment over 2 minutes, the combination of the retrieval device, the microcatheter and the retrieval device were retrieved and removed. Following reverse of flow arrest, there appeared to be modest improved flow in the proximal superior division. It was now noted the inferior division had a near occlusive clot in the proximal aspect. A fourth pass was then made this time using a combination of the Phenom microcatheter, inside of a 6 [place] Catalyst 135 cm catheter advanced over a 0.014 inch standard Synchro micro guidewire to the distal right M1 segment. The micro guidewire was then advanced into the M2 M3 region of the inferior division followed by the microcatheter. The guidewire was removed. A 4 mm x 40 mm Solitaire X retrieval device was deployed with the Catalyst guide catheter advanced at the origin of inferior division and  distal to this. Following proximal flow arrest and constant aspiration for approximately 2 minutes at the hub of the Catalyst guide catheter, the combination  of the retrieval device, the microcatheter and the Catalyst catheter were retrieved and removed following reversal of flow arrest. Improved flow was now noted into the inferior division proximally through the two main branch points. Following each thrombectomy, strips of fragments were seen intertwined either in the retrieval device, or in the aspiration catheters. 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 It was therefore decided to proceed with placement of a rescue stent in order to prevent occlusion of the major right MCA inferior division branch. The microcatheter was again advanced over a 0.014 inch standard Synchro micro guidewire with a 6 [place] Catalyst guide catheter. The microcatheter was advanced over a micro guidewire without difficulty into the M2 region followed by the removal of the micro guidewire. Again good aspiration obtained from the hub of the microcatheter. This was then connected to continuous heparinized saline infusion. Measurements performed of the right middle cerebral artery in the mid M1 segment, and also the proximal inferior division distal to the severe stenosis. A 4 mm x 24 mm Neuroform Atlas stent was advanced to the distal end of the microcatheter. The O ring on the delivery microcatheter was then loosened. With slight forward gentle traction with the right hand on the delivery micro guidewire with left hand the delivery microcatheter was gently retrieved unsheathing the distal and then the proximal portion of the stent with excellent coverage at the site of the severe stenosis. The delivery apparatus was removed. A control arteriogram performed through the balloon guide in the distal cervical ICA on the right demonstrated now significantly improved  caliber and flow through the right middle cerebral artery the dominant inferior division. There was poor stagnant flow noted in the proximal superior division. Free flow through the stented segment and also the other branch of the right middle cerebral artery except for the superior division. 8 mg of Integrilin was given intra-arterially in order to prevent intra stent platelet aggregation. A 10 minute post stent arteriogram continued to demonstrate flow through the stented segment in the right MCA distribution except for the non dominant superior division. A TICI 2b revascularization was maintained. The right posterior communicating artery with the right posterior cerebral artery distribution remained unchanged. Throughout the procedure, the patient's blood pressure and neurological status remained stable. A CT of the brain performed following the third pass demonstrated no mass-effect or midline shift with mild element of contrast in the right perisylvian region. The balloon guide was then retrieved and removed. The 8 French 25 cm Pinnacle sheath was then exchanged over a 0.035 inch J-tip guidewire for a short 8 [place] Pinnacle sheath. An arteriogram performed through this demonstrated spasm in the previously noted tortuous right common iliac artery. There was small amount of contrast noted in the soft tissue at the site of entry of the 8 [place] Pinnacle sheath which completely disappeared on repeat arteriogram performed approximately 3 minutes later. The 8 French sheath was removed and a 7 [place] ExoSeal closure device was placed. Also minimal compression was held for approximately 25 minutes. The distal pulses remained Dopplerable in the dorsalis pedis, and the posterior tibial regions bilaterally. Patient was left intubated on account of the patient's inability to communicate due to difficulty hearing and also her neurological condition. During this time, while pressure was held, and firmness was felt in the  region of the pannus on the right side in the right lower quadrant, and also in the right inguinal region. In view of  the unstable blood pressure, the patient was sent to CT scan for CT of the pelvis and also of the abdomen and also CT of the brain. CT of the brain revealed no evidence of mass effect or midline shift with no significant change in the right perisylvian hyperattenuation felt to be due to a contrast stain. CT of the abdomen and pelvis revealed no evidence of the overlying skin tightness. Hemodynamically the patient was now stable with blood pressure in the 150s over 80s and the heart rate being regular in the 80s to 90s sinus rhythm. Patient was then transferred to the neuro ICU intubated for post thrombectomy management. IMPRESSION: Status post endovascular revascularization of occluded right middle cerebral M1 segment with 4 passes with different stent retrievers and Penumbra aspiration achieving a TICI 2B revascularization. Status post rescue stent placement from the distal M1 segment into the proximal dominant inferior division of the right middle cerebral artery due to underlying severe intracranial arteriosclerosis. PLAN: Follow-up in the clinic 4 weeks post discharge. Electronically Signed   By: Luanne Bras M.D.   On: 12/30/2019 17:15   DG Chest Port 1 View  Result Date: 12/30/2019 CLINICAL DATA:  Intubation. EXAM: PORTABLE CHEST 1 VIEW COMPARISON:  Radiograph 11/08/2019. FINDINGS: Low positioning of the endotracheal tube 6 mm from the carina. Enteric tube in place with tip below the diaphragm not included in this chest field of view. Stable cardiomegaly. Unchanged mediastinal contours. Interstitial coarsening which appears chronic. No focal airspace disease, pleural effusion, or pneumothorax. No acute osseous abnormalities are seen. IMPRESSION: 1. Low positioning of the endotracheal tube 6 mm from the carina. Retraction of 2-3 cm recommended. 2. Enteric tube in place with tip below  the diaphragm. 3. Stable cardiomegaly and chronic interstitial coarsening. Electronically Signed   By: Keith Rake M.D.   On: 12/30/2019 01:11   DG Abd Portable 1V  Result Date: 12/30/2019 CLINICAL DATA:  OG tube placement. EXAM: PORTABLE ABDOMEN - 1 VIEW COMPARISON:  Abdomen pelvis CT yesterday. FINDINGS: Tip and side port of the enteric tube below the diaphragm in the stomach. Excreted IV contrast within both renal collecting systems and urinary bladder. Bilateral ureteral stents in place. Nephrograms demonstrate mild hydronephrosis. Nonobstructive bowel gas pattern. IMPRESSION: 1. Tip and side port of the enteric tube below the diaphragm in the stomach. 2. Bilateral ureteral stents in place. Excreted IV contrast in both renal collecting systems in the urinary bladder with mild bilateral hydronephrosis. Electronically Signed   By: Keith Rake M.D.   On: 12/30/2019 01:13   VAS [place] GROIN PSEUDOANEURYSM  Result Date: 12/31/2019  ARTERIAL PSEUDOANEURYSM  Exam: Right groin Indications: Patient complains of bruising. Comparison Study: no prior Performing Technologist: Abram Sander RVS  Examination Guidelines: A complete evaluation includes B-mode imaging, spectral Doppler, color Doppler, and power Doppler as needed of all accessible portions of each vessel. Bilateral testing is considered an integral part of a complete examination. Limited examinations for reoccurring indications may be performed as noted. +------------+----------+--------+------+----------+ Right DuplexPSV (cm/s)WaveformPlaqueComment(s) +------------+----------+--------+------+----------+ CFA            147    biphasic                 +------------+----------+--------+------+----------+ Prox SFA       146    biphasic                 +------------+----------+--------+------+----------+  Summary: No evidence of pseudoaneurysm, AVF or DVT  Diagnosing physician: Deitra Mayo MD Electronically signed by Harrell Gave  Scot Dock MD on 12/31/2019 at 8:54:10 AM.   --------------------------------------------------------------------------------    Final    IR PERCUTANEOUS ART THROMBECTOMY/INFUSION INTRACRANIAL INC DIAG ANGIO  Result Date: 12/31/2019 INDICATION: New onset left-sided hemiplegia, right gaze deviation. Occluded right middle cerebral M1 segment on CT angiogram of the head and neck. EXAM: 1. EMERGENT LARGE VESSEL OCCLUSION THROMBOLYSIS (anterior CIRCULATION) COMPARISON:  CT angiogram of the head and neck of June, 27, 2021. MEDICATIONS: Vancomycin 1 g IV was administered within 1 hour of the procedure. ANESTHESIA/SEDATION: General anesthesia CONTRAST:  Isovue 300 approximately 170 mL FLUOROSCOPY TIME:  Fluoroscopy Time: 131 minutes 12 seconds (4835 mGy). COMPLICATIONS: None immediate. TECHNIQUE: Following a full explanation of the procedure along with the potential associated complications, an informed witnessed consent was obtained the patient's son. The risks of intracranial hemorrhage of 10%, worsening neurological deficit, ventilator dependency, death and inability to revascularize were all reviewed in detail with the patient's son. The patient was then put under general anesthesia by the Department of Anesthesiology at [facility]. The right groin was prepped and draped in the usual sterile fashion. Thereafter using modified Seldinger technique, transfemoral access into the right common femoral artery was obtained without difficulty. Over a 0.035 inch guidewire an 8 French 25 cm Pinnacle sheath was inserted. Through this, and also over a 0.035 inch guidewire a combination of a select 5 French 125 cm Simmons 2 catheter inside of a 95 cm 087 balloon guide catheter was advanced without difficulty to the right common carotid artery. The guidewire and the select catheter were removed. Good aspiration was obtained from the hub of the balloon guide catheter just proximal to the right common carotid artery  bifurcation. Arteriogram was then performed centered extra cranially and intracranially. FINDINGS: The right common carotid arteriogram demonstrates the right external carotid artery and its major branches to be widely patent. The right internal carotid artery at the bulb demonstrates a smooth shallow plaque. Moderate tortuosity was noted of the proximal right internal carotid artery. 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 Distal to this the cervical petrous junction demonstrates normal opacification. Distal to this the petrous, cavernous, and the cavernous segments demonstrate patency as does the supraclinoid segment. Opacification is seen of the right posterior communicating artery opacifying the right posterior cerebral distribution. The right middle cerebral artery demonstrates complete angiographic occlusion at the origin of the anterior temporal branch. Focal areas of caliber irregularity are seen of the proximal right middle cerebral artery. PROCEDURE: Through the 087 balloon guide catheter in the proximal right internal carotid artery, a Zoom 071 135 cm catheter inside of which was an 021 160 cm Phenom microcatheter was advanced over a 0.014 inch standard Synchro micro guidewire gingerly through the right internal carotid artery in the neck and into the supraclinoid right ICA. The micro guidewire was then gently manipulated with a torque device and advanced into the inferior division M2 M3 region of the right middle cerebral artery followed by the microcatheter. The guidewire was removed. Good aspiration was obtained from the hub of the microcatheter. Gentle contrast injection demonstrated safe position of tip of the microcatheter. A 5 mm x 37 mm Embotrap retrieval device was then  advanced to the distal end of the microcatheter. The O ring on the delivery micro guidewire was loosened. With slight forward gentle traction with  the right hand on the delivery micro guidewire, with the left hand the retrieval device was deployed. The 071 Zoom catheter was then advanced into the right middle cerebral artery engaging the occluded right middle cerebral artery. With constant flow arrest in the right internal carotid artery, and constant aspiration using a Penumbra device at the hub of the 071 Zoom catheter for approximately 3 minutes, the combination of the retrieval device, the microcatheter, and the Zoom aspiration catheter were retrieved and removed. Following reversal of flow arrest, a control arteriogram performed through the balloon guide catheter in the right internal carotid artery demonstrates revascularization of the right middle cerebral artery and also of the superior and inferior divisions. The anterior temporal branch remains patent. A TICI 2B revascularization was achieved. This also unmasked an irregular filling defect at the right middle cerebral artery terminus at the trifurcation region with flow noted in the distal inferior division. A second pass was then made again with the combination of the 071 Zoom catheter advanced over an 021 160 cm Phenom microcatheter over a 0.014 inch standard Synchro micro guidewire. Again access was obtained into the distal M2 M3 region of the inferior division with the micro guidewire then followed by the microcatheter. The guidewire was removed. Again safe position of the tip of the microcatheter was confirmed and connected to continuous heparinized saline infusion. A 4 mm x 40 mm Solitaire X retrieval device was then deployed in the manner described above. Again with proximal flow arrest in the right internal carotid artery proximally, and constant aspiration via the Zoom catheter which was now imbedded in the occluded right middle cerebral artery at  its trifurcation region over approximately 2-1/2 minutes, the combination of the retrieval device, the microcatheter, and the Zoom catheter was retrieved and removed. Following reversal of flow arrest, a control arteriogram performed through the right internal carotid artery demonstrated improved opacification of the right MCA trifurcation branches. There continued be occluded superior division. The combination of the microcatheter, inside a 071 Zoom aspiration catheter was again advanced to the right middle cerebral artery over a 0.014 inch standard Synchro micro guidewire, which was now advanced into the superior division of the right middle cerebral artery emanating at the MCA trifurcation region. The microcatheter was advanced to the M2 region over a micro guidewire. The micro guidewire was removed. Good aspiration obtained from the hub of the microcatheter. Again with flow arrest in the right internal carotid artery proximally, and constant aspiration with the Penumbra aspiration device at the hub of the aspiration catheter following deployment of a 3 mm x 20 mm Solitaire X retrieval device, for 2 minutes, the combination of the retrieval device, the microcatheter and the Zoom catheter were retrieved and removed. The superior division continued to be occluded. The superior division was again cannulated with a microcatheter over a micro guidewire as described above. After having verified safe position of the tip of the microcatheter, in the superior division M2 region, a 4 mm x 40 mm Solitaire X retrieval device was deployed in the usual manner. Again with proximal flow arrest in the proximal right ICA, and constant aspiration at the hub of the Zoom catheter at the site of the occluded superior division in the distal right middle cerebral M1 segment over 2 minutes, the combination of the retrieval device, the microcatheter and the retrieval device were retrieved and removed. Following reverse of flow arrest,  there appeared to be modest improved flow in the proximal superior division. It was now  noted the inferior division had a near occlusive clot in the proximal aspect. A fourth pass was then made this time using a combination of the Phenom microcatheter, inside of a 6 [place] Catalyst 135 cm catheter advanced over a 0.014 inch standard Synchro micro guidewire to the distal right M1 segment. The micro guidewire was then advanced into the M2 M3 region of the inferior division followed by the microcatheter. The guidewire was removed. A 4 mm x 40 mm Solitaire X retrieval device was deployed with the Catalyst guide catheter advanced at the origin of inferior division and distal to this. Following proximal flow arrest and constant aspiration for approximately 2 minutes at the hub of the Catalyst guide catheter, the combination of the retrieval device, the microcatheter and the Catalyst catheter were retrieved and removed following reversal of flow arrest. Improved flow was now noted into the inferior division proximally through the two main branch points. Following each thrombectomy, strips of fragments were seen intertwined either in the retrieval device, or in the aspiration catheters. 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 It was therefore decided to proceed with placement of a rescue stent in order to prevent occlusion of the major right MCA inferior division branch. The microcatheter was again advanced over a 0.014 inch standard Synchro micro guidewire with a 6 [place] Catalyst guide catheter. The microcatheter was advanced over a micro guidewire without difficulty into the M2 region followed by the removal of the micro guidewire. Again good aspiration obtained from the hub of the microcatheter. This was then connected to continuous heparinized saline infusion. Measurements performed of the right middle cerebral artery in the mid M1 segment,  and also the proximal inferior division distal to the severe stenosis. A 4 mm x 24 mm Neuroform Atlas stent was advanced to the distal end of the microcatheter. The O ring on the delivery microcatheter was then loosened. With slight forward gentle traction with the right hand on the delivery micro guidewire with left hand the delivery microcatheter was gently retrieved unsheathing the distal and then the proximal portion of the stent with excellent coverage at the site of the severe stenosis. The delivery apparatus was removed. A control arteriogram performed through the balloon guide in the distal cervical ICA on the right demonstrated now significantly improved caliber and flow through the right middle cerebral artery the dominant inferior division. There was poor stagnant flow noted in the proximal superior division. Free flow through the stented segment and also the other branch of the right middle cerebral artery except for the superior division. 8 mg of Integrilin was given intra-arterially in order to prevent intra stent platelet aggregation. A 10 minute post stent arteriogram continued to demonstrate flow through the stented segment in the right MCA distribution except for the non dominant superior division. A TICI 2b revascularization was maintained. The right posterior communicating artery with the right posterior cerebral artery distribution remained unchanged. Throughout the procedure, the patient's blood pressure and neurological status remained stable. A CT of the brain performed following the third pass demonstrated no mass-effect or midline shift with mild element of contrast in the right perisylvian region. The balloon guide was then retrieved and removed. The 8 French 25 cm Pinnacle sheath was then exchanged over a 0.035 inch J-tip guidewire for a short 8 [place] Pinnacle sheath. An arteriogram performed through this demonstrated spasm in the previously noted tortuous right common iliac artery.  There was small amount of  contrast noted in the soft tissue at the site of entry of the 8 [place] Pinnacle sheath which completely disappeared on repeat arteriogram performed approximately 3 minutes later. The 8 French sheath was removed and a 7 [place] ExoSeal closure device was placed. Also minimal compression was held for approximately 25 minutes. The distal pulses remained Dopplerable in the dorsalis pedis, and the posterior tibial regions bilaterally. Patient was left intubated on account of the patient's inability to communicate due to difficulty hearing and also her neurological condition. During this time, while pressure was held, and firmness was felt in the region of the pannus on the right side in the right lower quadrant, and also in the right inguinal region. In view of the unstable blood pressure, the patient was sent to CT scan for CT of the pelvis and also of the abdomen and also CT of the brain. CT of the brain revealed no evidence of mass effect or midline shift with no significant change in the right perisylvian hyperattenuation felt to be due to a contrast stain. CT of the abdomen and pelvis revealed no evidence of the overlying skin tightness. Hemodynamically the patient was now stable with blood pressure in the 150s over 80s and the heart rate being regular in the 80s to 90s sinus rhythm. Patient was then transferred to the neuro ICU intubated for post thrombectomy management. IMPRESSION: Status post endovascular revascularization of occluded right middle cerebral M1 segment with 4 passes with different stent retrievers and Penumbra aspiration achieving a TICI 2B revascularization. Status post rescue stent placement from the distal M1 segment into the proximal dominant inferior division of the right middle cerebral artery due to underlying severe intracranial arteriosclerosis. PLAN: Follow-up in the clinic 4 weeks post discharge. Electronically Signed   By: Luanne Bras M.D.   On:  12/30/2019 17:15   CT HEAD CODE STROKE WO CONTRAST  Result Date: 12/29/2019 CLINICAL DATA:  Code stroke.  Left-sided weakness EXAM: CT HEAD WITHOUT CONTRAST TECHNIQUE: Contiguous axial images were obtained from the base of the skull through the vertex without intravenous contrast. COMPARISON:  None. FINDINGS: Brain: There is no acute intracranial hemorrhage or mass effect. Gray-white differentiation is preserved. Ventricles and sulci are within normal limits in size and configuration. Patchy hypoattenuation in the supratentorial white matter is nonspecific but probably reflects chronic microvascular ischemic changes. Extra-axial dural-based hyperdense lesion along the high left frontal convexity and falx measuring 2.6 x 1.8 x 2.3 cm is consistent with a meningioma. There is no apparent underlying edema. This abuts the superior sagittal sinus. Vascular: No hyperdense vessel. Mild intracranial atherosclerotic calcification at the skull base. Skull: Unremarkable. Sinuses/Orbits: No acute abnormality. Other: Mastoid air cells are clear. ASPECTS ([place] Stroke Program Early CT Score) - Ganglionic level infarction (caudate, lentiform nuclei, internal capsule, insula, M1-M3 cortex): 7 - Supraganglionic infarction (M4-M6 cortex): 3 Total score (0-10 with 10 being normal): 10 IMPRESSION: No acute intracranial hemorrhage or evidence of acute infarction. ASPECT score is 10. Chronic microvascular ischemic changes. Left frontal meningioma without significant mass effect or underlying edema. Initial results were communicated to Dr. Lorraine Lax at [place] 6/27/2021by text page via the [facility] messaging system. Electronically Signed   By: Macy Mis M.D.   On: 12/29/2019 18:20   ECHOCARDIOGRAM LIMITED  Result Date: 12/30/2019    ECHOCARDIOGRAM LIMITED REPORT   Patient Name:   Gloria [name] Date of Exam: 12/30/2019 Medical Rec #:  [id number]           Height:  62.0 in Accession #:    [id number]          Weight:        211.0 lb Date of Birth:  14-Nov-1945            BSA:          1.956 m Patient Age:    47 years            BP:           132/80 mmHg Patient Gender: F                   HR:           64 bpm. Exam Location:  Inpatient Procedure: Limited Color Doppler, Cardiac Doppler and Limited Echo Indications:    stroke 434.91  History:        Patient has prior history of Echocardiogram examinations, most                 recent 11/09/2019. Arrythmias:Atrial Fibrillation.  Sonographer:    Johny Chess Referring Phys: [id number] [place]  1. Left ventricular ejection fraction, by estimation, is >75%. The left ventricle has hyperdynamic function. The left ventricle has no regional wall motion abnormalities. There is severe left ventricular hypertrophy. Left ventricular diastolic parameters are indeterminate.  2. Right ventricular systolic function is normal. The right ventricular size is normal. There is mildly elevated pulmonary artery systolic pressure.  3. Tricuspid valve regurgitation is mild to moderate.  4. The aortic valve is abnormal. Aortic valve regurgitation is mild to moderate. Mild aortic valve sclerosis is present, with no evidence of aortic valve stenosis.  5. The inferior vena cava is normal in size with greater than 50% respiratory variability, suggesting right atrial pressure of 3 mmHg. FINDINGS  Left Ventricle: Left ventricular ejection fraction, by estimation, is >75%. The left ventricle has hyperdynamic function. The left ventricle has no regional wall motion abnormalities. The left ventricular internal cavity size was small. There is severe left ventricular hypertrophy. Right Ventricle: The right ventricular size is normal. No increase in right ventricular wall thickness. Right ventricular systolic function is normal. There is mildly elevated pulmonary artery systolic pressure. The tricuspid regurgitant velocity is 3.14  m/s, and with an assumed right atrial pressure of 3 mmHg, the estimated  right ventricular systolic pressure is 47.8 mmHg. Pericardium: Trivial pericardial effusion is present. Mitral Valve: The mitral valve is abnormal. There is mild thickening of the mitral valve leaflet(s). Moderate mitral annular calcification. Tricuspid Valve: The tricuspid valve is grossly normal. Tricuspid valve regurgitation is mild to moderate. Aortic Valve: The aortic valve is abnormal. Aortic valve regurgitation is mild to moderate. Mild aortic valve sclerosis is present, with no evidence of aortic valve stenosis. Pulmonic Valve: The pulmonic valve was normal in structure. Pulmonic valve regurgitation is not visualized. Aorta: The aortic root and ascending aorta are structurally normal, with no evidence of dilitation. Venous: The inferior vena cava is normal in size with greater than 50% respiratory variability, suggesting right atrial pressure of 3 mmHg.  LEFT VENTRICLE PLAX 2D LVIDd:         3.10 cm LVIDs:         1.60 cm LV PW:         1.70 cm LV IVS:        1.60 cm LVOT diam:     1.90 cm LVOT Area:     2.84 cm  IVC IVC diam: 1.90 cm LEFT ATRIUM  Index LA diam:    4.70 cm 2.40 cm/m   AORTA Ao Root diam: 3.50 cm Ao Asc diam:  3.50 cm TRICUSPID VALVE TR Peak grad:   39.4 mmHg TR Vmax:        314.00 cm/s  SHUNTS Systemic Diam: 1.90 cm Dorris Carnes MD Electronically signed by Dorris Carnes MD Signature Date/Time: 12/30/2019/7:38:12 PM    Final     Labs:  CBC: Recent Labs    12/23/19 0935 12/23/19 0935 12/29/19 1802 12/29/19 1846 12/29/19 2300 12/30/19 0134 12/30/19 0500 12/31/19 0342  WBC 3.9*  --  5.8  --   --   --  7.8 10.4  HGB 11.8*   < > 11.8*   < > 9.2* 8.8* 8.4* 7.8*  HCT 38.1   < > 38.3   < > 27.0* 26.0* 26.6* 24.7*  PLT 224  --  225  --   --   --  200 223   < > = values in this interval not displayed.    COAGS: Recent Labs    12/29/19 1802  INR 1.1  APTT 25    BMP: Recent Labs    12/23/19 0935 12/23/19 0935 12/29/19 1802 12/29/19 1802 12/29/19 1846  12/29/19 1846 12/29/19 2300 12/30/19 0134 12/30/19 0500 12/31/19 0342  NA 143   < > 141   < > 140   < > 139 141 141 141  K 4.8   < > 5.3*   < > 5.2*   < > 4.4 4.3 4.5 4.7  CL 108   < > 106   < > 106  --  108  --  113* 112*  CO2 25  --  24  --   --   --   --   --  22 19*  GLUCOSE 96   < > 109*   < > 103*  --  134*  --  149* 103*  BUN 26*   < > 32*   < > 34*  --  26*  --  28* 33*  CALCIUM 9.0  --  8.9  --   --   --   --   --  7.9* 8.0*  CREATININE 1.49*   < > 1.76*   < > 1.90*  --  1.50*  --  1.44* 1.88*  GFRNONAA 34*  --  28*  --   --   --   --   --  36* 26*  GFRAA 40*  --  32*  --   --   --   --   --  41* 30*   < > = values in this interval not displayed.    LIVER FUNCTION TESTS: Recent Labs    11/08/19 1722 11/08/19 1722 11/09/19 0431 11/10/19 0455 11/11/19 0442 12/29/19 1802  BILITOT 0.6  --   --  0.3 0.3 0.6  AST 14*  --   --  13* 13* 17  ALT 14  --   --  13 14 11   ALKPHOS 66  --   --  57 54 82  PROT 6.9  --   --  6.1* 6.2* 6.2*  ALBUMIN 3.3*   < > 3.0* 2.7* 2.8* 3.2*   < > = values in this interval not displayed.    Assessment and Plan:  History of acute CVA s/p cerebral arteriogram with emergent mechanical thrombectomy of right MCA M1 occlusion achieving a TICI 2b revascularization, along with revascularization of right MCA stenosis using rescue  stent placement via right femoral approach 12/29/2019 by Dr. Estanislado Pandy. Patient's condition stable- remains intubated/sedated, can spontaneously move right side, left side withdraws from pain. Right groin puncture site stable, distal pulses (DPs) palpable bilaterally with Doppler. For possible extubation today per RN. Continue taking Brilinta 90 mg twice daily and Aspirin 81 mg once daily. Further plans per neurology/CCM- appreciate and agree with management. NIR to follow.   Electronically Signed: Earley Abide, PA-C 12/31/2019, 10:57 AM   I spent a total of 25 Minutes at the the patient's bedside AND on the patient's  [name] floor or unit, greater than 50% of which was counseling/coordinating care for CVA s/p revascularization.

## 2019-12-31 NOTE — Evaluation (Signed)
Occupational Therapy Evaluation Patient Details Name: Gloria [name] MRN: [id number] DOB: 07/31/1945 Today's Date: 12/31/2019    History of Present Illness This 74 y.o. female admitted with flaccid Lt side and Rt gaze preference.  Pt received tPA.  CTA revealed Rt M1 occlusion and she was taken for emergent thrombectomy.  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  She was extubated 12/31/2019. PMH:  Pt is very HOH - reads lips, HTN, h/o kidney stones, A-Fib; s/p multiple cystocopies with stent placement.     Clinical Impression   Pt admitted with above. She demonstrates the below listed deficits and will benefit from continued OT to maximize safety and independence with BADLs.  Pt presents to OT with significant Rt gaze preference with LT neglect, Lt hemiplegia, impaired cognition.  She was initially lethargic and minimally engaged, but once pt moved into chair position, arousal improved significantly.  She is able to read some info presented in the far right visual field, but she did not follow any of the questions for a variety of reasons given by her (see below).  She currently requires total A for ADLs and functional mobility.  Per the chart, she lives alone with her brother next door.  Optimally feel she would benefit from CIR level rehab due to the severity of her CVA, she would be best served by a rehab team who specializes in CVA.  Will follow.       Follow Up Recommendations  CIR    Equipment Recommendations  None recommended by OT    Recommendations for Other Services Rehab consult     Precautions / Restrictions Precautions Precautions: Fall Precaution Comments: Rt gaze preference with Lt neglect       Mobility Bed Mobility Overal bed mobility: Needs Assistance             General bed mobility comments: Pt requires total  A for all aspects   Transfers                 General transfer comment: unable to attempt safely     Balance                                           ADL either performed or assessed with clinical judgement   ADL Overall ADL's : Needs assistance/impaired Eating/Feeding: NPO   Grooming: Wash/dry hands;Wash/dry face;Oral care;Brushing hair;Total assistance;Sitting;Bed level   Upper Body Bathing: Total assistance;Bed level;Sitting   Lower Body Bathing: Total assistance;Bed level   Upper Body Dressing : Total assistance;Bed level   Lower Body Dressing: Total assistance;Bed level   Toilet Transfer: Total assistance Toilet Transfer Details (indicate cue type and reason): unable          Functional mobility during ADLs: Total assistance;+2 for physical assistance General ADL Comments: Pt able to read some commands asking her to perform simple ADL tasks, but she refuses them all.  "I don't have my special toothbrush", "if I comb my hair it will fall out", "I don't want to wash my face     Vision   Vision Assessment?: Yes Eye Alignment: Impaired (comment) Ocular Range of Motion: Restricted on the left Alignment/Gaze Preference: Gaze right Tracking/Visual Pursuits: Other (comment) Visual Fields: Left visual field deficit (potentially )  Additional Comments: Pt with profound Rt gaze preference.  She did look to midline briefly x 1. She does not cross midline.  She does not blink to threat on the left     Perception Perception Perception Tested?: Yes Perception Deficits: Inattention/neglect Inattention/Neglect: Does not attend to left visual field;Does not attend to left side of body   Praxis Praxis Praxis tested?: Deficits Deficits: Initiation    Pertinent Vitals/Pain Pain Assessment: Faces Faces Pain Scale: No hurt     Hand Dominance Right   Extremity/Trunk Assessment Upper Extremity Assessment Upper Extremity Assessment: LUE  deficits/detail LUE Deficits / Details: No active movement noted.  edema noted.  PROM WFL  LUE Coordination: decreased fine motor;decreased gross motor   Lower Extremity Assessment Lower Extremity Assessment: Defer to PT evaluation   Cervical / Trunk Assessment Cervical / Trunk Assessment: Other exceptions Cervical / Trunk Exceptions: Rt gaze preference with head/neck turned to the Rt    Communication Communication Communication: HOH;Other (comment) (reads lips )   Cognition Arousal/Alertness: Awake/alert;Lethargic Behavior During Therapy: Flat affect Overall Cognitive Status: Difficult to assess Area of Impairment: Attention                   Current Attention Level: Sustained;Focused           General Comments: Pt severely HOH and demonstrates severe Lt neglect with profound Rt gaze preference.  She was initially very lethargic with minimal eye opening, however, when she was moved into partial chair position in the bed, her arousal improved.  Once she is able to visually locate person, and able to focus on them, she was able to read short bits of info if it was placed on her far right, and she was able to respond semi appropriately to the written info, however, she didn't follow any commands as she had a reason why not to perform all tasks asked of her.    General Comments  Pt moved into egress/chair position with resultant improvement in arousal     Exercises     Shoulder Instructions      Home Living Family/patient expects to be discharged to:: Private residence Living Arrangements: Alone Available Help at Discharge: Family;Available PRN/intermittently Type of Home: House       Home Layout: One level               Home Equipment: Cane - quad;Cane - single point;Bedside commode;Walker - 2 wheels   Additional Comments: Pt unable to provide info. therefore info gleaned from chart review       Prior Functioning/Environment Level of Independence:  Independent with assistive device(s)        Comments: walks with a cane, sponge bathes only        OT Problem List: Decreased strength;Decreased range of motion;Decreased activity tolerance;Impaired balance (sitting and/or standing);Decreased coordination;Impaired vision/perception;Decreased cognition;Decreased safety awareness;Decreased knowledge of use of DME or AE;Impaired sensation;Impaired tone;Obesity;Impaired UE functional use;Increased edema      OT Treatment/Interventions: Self-care/ADL training;Neuromuscular education;DME and/or AE instruction;Therapeutic activities;Cognitive remediation/compensation;Visual/perceptual remediation/compensation;Patient/family education;Balance training;Manual therapy;Splinting;Therapeutic exercise    OT Goals(Current goals can be found in the care plan section) Acute Rehab OT Goals OT Goal Formulation: Patient unable to participate in goal setting Time For Goal Achievement: 01/14/20 Potential to Achieve Goals: Good ADL Goals Pt Will Perform Grooming: with mod assist;sitting Pt Will Perform Upper Body Bathing: with mod assist;sitting Pt Will Transfer to Toilet: with mod assist;with +2 assist;stand pivot transfer;regular height toilet;bedside commode;grab bars Pt Will Perform Toileting -  Clothing Manipulation and hygiene: with max assist;sit to/from stand Additional ADL Goal #1: Pt will consistently locate ADL items at mindline with min cues Additional ADL Goal #2: Pt be able to direct caregiver on proper positioning of Lt UE  OT Frequency: Min 2X/week   Barriers to D/C: Decreased caregiver support  unsure if family will be able to provide necessary level of assistance        Co-evaluation PT/OT/SLP Co-Evaluation/Treatment: Yes     OT goals addressed during session: ADL's and self-care;Strengthening/ROM      AM-PAC OT "6 Clicks" Daily Activity     Outcome Measure Help from another person eating meals?: Total Help from another person  taking care of personal grooming?: Total Help from another person toileting, which includes using toliet, bedpan, or urinal?: Total Help from another person bathing (including washing, rinsing, drying)?: Total Help from another person to put on and taking off regular upper body clothing?: Total Help from another person to put on and taking off regular lower body clothing?: Total 6 Click Score: 6   End of Session Equipment Utilized During Treatment: Oxygen Nurse Communication: Mobility status  Activity Tolerance: Patient tolerated treatment well Patient left: in bed;with call bell/phone within reach;Other (comment) (with PT )  OT Visit Diagnosis: Unsteadiness on feet (R26.81);Muscle weakness (generalized) (M62.81);Cognitive communication deficit (R41.841);Hemiplegia and hemiparesis Symptoms and signs involving cognitive functions: Cerebral infarction Hemiplegia - Right/Left: Left Hemiplegia - dominant/non-dominant: Non-Dominant Hemiplegia - caused by: Cerebral infarction                Time: 0737-1062 OT Time Calculation (min): 49 min Charges:  OT General Charges $OT Visit: 1 Visit OT Evaluation $OT Eval Moderate Complexity: 1 Mod OT Treatments $Self Care/Home Management : 8-22 mins  Nilsa Nutting., OTR/L Acute Rehabilitation Services Pager [phone] Office [phone]   Lucille Passy M 12/31/2019, 4:39 PM

## 2019-12-31 NOTE — Progress Notes (Signed)
SLP Cancellation Note  Patient Details Name: Gloria [name] MRN: [id number] DOB: 16-Jul-1945   Cancelled treatment:       Reason Eval/Treat Not Completed: Medical issues which prohibited therapy (remains on vent this am). Will continue to follow.   Osie Bond., M.A. [place] Acute Rehabilitation Services Pager [phone] Office [phone]  12/31/2019, 7:18 AM

## 2019-12-31 NOTE — Procedures (Signed)
Extubation Procedure Note  Patient Details:   Name: Gloria [name] DOB: 08/26/1945 MRN: [id number]   Airway Documentation:    Vent end date: 12/31/19 Vent end time: 1105   Evaluation  O2 sats: stable throughout Complications: No apparent complications Patient did tolerate procedure well. Bilateral Breath Sounds: Rhonchi, Diminished   Yes   PT extubated to [facility] per MD order. Positive cuff leak noted prior to extubation. Pt able to speak (pt deaf but able to vocalize) and has strong non productive cough post extubation. No stridor heard, VS within normal limits. RT will continue to monitor  Angelique Holm 12/31/2019, 11:08 AM

## 2020-01-01 ENCOUNTER — Inpatient Hospital Stay (HOSPITAL_COMMUNITY): Payer: PPO

## 2020-01-01 DIAGNOSIS — E669 Obesity, unspecified: Secondary | ICD-10-CM

## 2020-01-01 DIAGNOSIS — R32 Unspecified urinary incontinence: Secondary | ICD-10-CM | POA: Diagnosis present

## 2020-01-01 DIAGNOSIS — I482 Chronic atrial fibrillation, unspecified: Secondary | ICD-10-CM

## 2020-01-01 DIAGNOSIS — N183 Chronic kidney disease, stage 3 unspecified: Secondary | ICD-10-CM | POA: Diagnosis present

## 2020-01-01 DIAGNOSIS — T148XXA Other injury of unspecified body region, initial encounter: Secondary | ICD-10-CM

## 2020-01-01 DIAGNOSIS — I69391 Dysphagia following cerebral infarction: Secondary | ICD-10-CM

## 2020-01-01 DIAGNOSIS — Z6841 Body Mass Index (BMI) 40.0 and over, adult: Secondary | ICD-10-CM | POA: Diagnosis not present

## 2020-01-01 DIAGNOSIS — S301XXA Contusion of abdominal wall, initial encounter: Secondary | ICD-10-CM | POA: Diagnosis not present

## 2020-01-01 DIAGNOSIS — I10 Essential (primary) hypertension: Secondary | ICD-10-CM | POA: Diagnosis not present

## 2020-01-01 DIAGNOSIS — I6601 Occlusion and stenosis of right middle cerebral artery: Secondary | ICD-10-CM

## 2020-01-01 DIAGNOSIS — R319 Hematuria, unspecified: Secondary | ICD-10-CM | POA: Diagnosis present

## 2020-01-01 DIAGNOSIS — H9190 Unspecified hearing loss, unspecified ear: Secondary | ICD-10-CM

## 2020-01-01 DIAGNOSIS — E876 Hypokalemia: Secondary | ICD-10-CM | POA: Diagnosis not present

## 2020-01-01 DIAGNOSIS — N1832 Chronic kidney disease, stage 3b: Secondary | ICD-10-CM

## 2020-01-01 DIAGNOSIS — I63411 Cerebral infarction due to embolism of right middle cerebral artery: Principal | ICD-10-CM

## 2020-01-01 DIAGNOSIS — D62 Acute posthemorrhagic anemia: Secondary | ICD-10-CM

## 2020-01-01 DIAGNOSIS — E785 Hyperlipidemia, unspecified: Secondary | ICD-10-CM | POA: Diagnosis present

## 2020-01-01 DIAGNOSIS — H903 Sensorineural hearing loss, bilateral: Secondary | ICD-10-CM

## 2020-01-01 DIAGNOSIS — I4891 Unspecified atrial fibrillation: Secondary | ICD-10-CM | POA: Diagnosis present

## 2020-01-01 LAB — GLUCOSE, CAPILLARY
Glucose-Capillary: 116 mg/dL — ABNORMAL HIGH (ref 70–99)
Glucose-Capillary: 117 mg/dL — ABNORMAL HIGH (ref 70–99)
Glucose-Capillary: 120 mg/dL — ABNORMAL HIGH (ref 70–99)
Glucose-Capillary: 120 mg/dL — ABNORMAL HIGH (ref 70–99)
Glucose-Capillary: 122 mg/dL — ABNORMAL HIGH (ref 70–99)
Glucose-Capillary: 90 mg/dL (ref 70–99)

## 2020-01-01 LAB — HEMOGLOBIN AND HEMATOCRIT, BLOOD
HCT: 27.3 % — ABNORMAL LOW (ref 36.0–46.0)
Hemoglobin: 8.7 g/dL — ABNORMAL LOW (ref 12.0–15.0)

## 2020-01-01 LAB — BASIC METABOLIC PANEL
Anion gap: 8 (ref 5–15)
BUN: 32 mg/dL — ABNORMAL HIGH (ref 8–23)
CO2: 22 mmol/L (ref 22–32)
Calcium: 8.1 mg/dL — ABNORMAL LOW (ref 8.9–10.3)
Chloride: 112 mmol/L — ABNORMAL HIGH (ref 98–111)
Creatinine, Ser: 1.64 mg/dL — ABNORMAL HIGH (ref 0.44–1.00)
GFR calc Af Amer: 35 mL/min — ABNORMAL LOW (ref >60)
GFR calc non Af Amer: 30 mL/min — ABNORMAL LOW (ref >60)
Glucose, Bld: 125 mg/dL — ABNORMAL HIGH (ref 70–99)
Potassium: 4.3 mmol/L (ref 3.5–5.1)
Sodium: 142 mmol/L (ref 135–145)

## 2020-01-01 LAB — CBC
HCT: 23.5 % — ABNORMAL LOW (ref 36.0–46.0)
Hemoglobin: 7.3 g/dL — ABNORMAL LOW (ref 12.0–15.0)
MCH: 32.6 pg (ref 26.0–34.0)
MCHC: 31.1 g/dL (ref 30.0–36.0)
MCV: 104.9 fL — ABNORMAL HIGH (ref 80.0–100.0)
Platelets: 157 10*3/uL (ref 150–400)
RBC: 2.24 MIL/uL — ABNORMAL LOW (ref 3.87–5.11)
RDW: 14.3 % (ref 11.5–15.5)
WBC: 6 10*3/uL (ref 4.0–10.5)
nRBC: 0 % (ref 0.0–0.2)

## 2020-01-01 LAB — PREPARE RBC (CROSSMATCH)

## 2020-01-01 LAB — PHOSPHORUS: Phosphorus: 3.3 mg/dL (ref 2.5–4.6)

## 2020-01-01 LAB — MAGNESIUM: Magnesium: 1.8 mg/dL (ref 1.7–2.4)

## 2020-01-01 LAB — TRIGLYCERIDES: Triglycerides: 110 mg/dL (ref <150)

## 2020-01-01 MED ORDER — FUROSEMIDE 10 MG/ML IJ SOLN
20.0000 mg | Freq: Once | INTRAMUSCULAR | Status: AC
  Administered 2020-01-01: 20 mg via INTRAVENOUS
  Filled 2020-01-01: qty 2

## 2020-01-01 MED ORDER — HEPARIN SODIUM (PORCINE) 5000 UNIT/ML IJ SOLN
5000.0000 [IU] | Freq: Three times a day (TID) | INTRAMUSCULAR | Status: DC
  Administered 2020-01-01 – 2020-01-09 (×25): 5000 [IU] via SUBCUTANEOUS
  Filled 2020-01-01 (×25): qty 1

## 2020-01-01 MED ORDER — SODIUM CHLORIDE 0.9 % IV SOLN: 250.0000 mL | INTRAVENOUS | Status: DC

## 2020-01-01 MED ORDER — ENOXAPARIN SODIUM 40 MG/0.4ML ~~LOC~~ SOLN: 40.0000 mg | SUBCUTANEOUS | Status: DC

## 2020-01-01 MED ORDER — SODIUM CHLORIDE 0.9% IV SOLUTION: Freq: Once | INTRAVENOUS | Status: AC

## 2020-01-01 MED ORDER — CIPROFLOXACIN IN D5W 200 MG/100ML IV SOLN
200.0000 mg | Freq: Once | INTRAVENOUS | Status: AC
  Administered 2020-01-01: 200 mg via INTRAVENOUS
  Filled 2020-01-01: qty 100

## 2020-01-01 NOTE — Evaluation (Signed)
Speech Language Pathology Evaluation Patient Details Name: Gloria [name] MRN: [id number] DOB: 1946-02-16 Today's Date: 01/01/2020 Time: 1040-1056 SLP Time Calculation (min) (ACUTE ONLY): 16 min  Problem List:  Patient Active Problem List   Diagnosis Date Noted  . Atrial fibrillation ([place]) 01/01/2020  . Groin hematoma, R s/p IR for stroke 01/01/2020  . Hematuria 01/01/2020  . Accelerated hypertension 01/01/2020  . Hyperlipidemia 01/01/2020  . Dysphagia following cerebral infarction 01/01/2020  . CKD (chronic kidney disease), stage IIIa 01/01/2020  . Obesity (BMI 30-39.9) 01/01/2020  . Urinary incontinence 01/01/2020  . HOH (hard of hearing) 01/01/2020  . Middle cerebral artery embolism, right 12/30/2019  . 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 Acute renal failure (ARF) ([place]) 11/08/2019  . Obstructive uropathy 11/08/2019  . Hyperkalemia 11/08/2019  . Metabolic acidosis 70/62/3762  . Hyperphosphatemia 11/08/2019   Past Medical History:  Past Medical History:  Diagnosis Date  . Arthritis    knees  . Atrial fibrillation ([place])   . Dysrhythmia   . History of kidney stones   . HOH (hard of hearing)   . Hypertension    Past Surgical History:  Past Surgical History:  Procedure Laterality Date  . CHOLECYSTECTOMY    . CYSTOSCOPY WITH STENT PLACEMENT Bilateral 11/09/2019   Procedure: CYSTOSCOPY BILATERAL URETERAL STENT PLACEMENT;  Surgeon: Ardis Hughs, MD;  Location: [facility];  Service: Urology;  Laterality: Bilateral;  . CYSTOSCOPY/URETEROSCOPY/HOLMIUM LASER/STENT PLACEMENT Right 11/22/2019   Procedure: CYSTOSCOPY RIGHT URETEROSCOPY/HOLMIUM LASER STONE EXTRACTION /STENT EXCHANGE;  Surgeon: Ardis Hughs, MD;  Location: [facility];  Service: Urology;  Laterality: Right;  . CYSTOSCOPY/URETEROSCOPY/HOLMIUM LASER/STENT PLACEMENT Left 12/06/2019   Procedure:  CYSTOSCOPY BILATERAL URETEROSCOPY/HOLMIUM LASER/STENT EXCHANGE;  Surgeon: Ardis Hughs, MD;  Location: [facility];  Service: Urology;  Laterality: Left;  . CYSTOSCOPY/URETEROSCOPY/HOLMIUM LASER/STENT PLACEMENT Bilateral 12/25/2019   Procedure: BILATERAL URETEROSCOPY/HOLMIUM LASER LEFT, BILATERAL STONE EXTRACTION BILATERAL STENT EXCHANGE;  Surgeon: Ardis Hughs, MD;  Location: [facility];  Service: Urology;  Laterality: Bilateral;  . IR CT HEAD LTD  12/30/2019  . IR INTRA CRAN STENT  12/30/2019  . IR PERCUTANEOUS ART THROMBECTOMY/INFUSION INTRACRANIAL INC DIAG ANGIO  12/30/2019  . RADIOLOGY WITH ANESTHESIA N/A 12/29/2019   Procedure: IR WITH ANESTHESIA;  Surgeon: Radiologist, Medication, MD;  Location: [facility];  Service: Radiology;  Laterality: N/A;   HPI:  This 74 y.o. female admitted with flaccid Lt side and Rt gaze preference.  Pt received tPA.  CTA revealed Rt M1 occlusion and she was taken for emergent thrombectomy.  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  She was extubated 12/31/2019. PMH:  Pt is very HOH - reads lips, HTN, h/o kidney stones, A-Fib; s/p multiple cystocopies with stent placement.     Assessment / Plan / Recommendation Clinical Impression  Pt is very HOH at baseline, primarily reading lips per chart. She presents now with a L neglect and R gaze preference that impacts her ability to lip read. Transparent face mask used for evaluation to given pt the opportunity to try, with SLP also positioned in her R visual field with intermittent response to questions and commands. At times she simply did not respond to SLP. Other times, she seemed to have  understood the instruction but gave a reason why she would not do the task. She did follow several oral motor commands when cued, as well as a few others when explained the rationale for tasks. A  similar presentation is noted when trying to provide written cues and commands. Pt was intermittently drowsy throughout assessment but knew she was in the hospital and that she had fallen at home. In her responses her language appears to be fluent, but her speech is dysarthric. She seems to have limited emergent awareness of current deficits. She will benefit from intensive SLP f/u to maximize cognition and communication.     SLP Assessment  SLP Recommendation/Assessment: Patient needs continued Speech Lanaguage Pathology Services SLP Visit Diagnosis: Dysarthria and anarthria (R47.1);Cognitive communication deficit (R41.841)    Follow Up Recommendations  Inpatient Rehab    Frequency and Duration min 2x/week  2 weeks      SLP Evaluation Cognition  Overall Cognitive Status: Difficult to assess Arousal/Alertness:  (drowsy) Orientation Level: Oriented to place;Other (comment) (partially oriented to situation) Attention: Sustained Sustained Attention: Impaired Sustained Attention Impairment: Functional basic Awareness: Impaired Awareness Impairment: Emergent impairment Problem Solving: Impaired Problem Solving Impairment: Functional basic Safety/Judgment: Impaired       Comprehension  Auditory Comprehension Overall Auditory Comprehension: Impaired Commands: Impaired One Step Basic Commands: 50-74% accurate Interfering Components: Hearing    Expression Expression Primary Mode of Expression: Verbal Verbal Expression Overall Verbal Expression: Appears within functional limits for tasks assessed   Oral / Motor  Oral Motor/Sensory Function Overall Oral Motor/Sensory Function: Moderate impairment Facial ROM: Reduced left;Suspected CN VII (facial) dysfunction Facial Symmetry: Abnormal symmetry left;Suspected CN VII (facial) dysfunction Facial Strength: Reduced left;Suspected CN VII (facial) dysfunction Lingual Symmetry: Within Functional Limits Motor Speech Overall Motor Speech:  Impaired Respiration: Within functional limits Phonation: Normal Resonance: Within functional limits Articulation: Impaired Level of Impairment: Phrase Intelligibility: Intelligibility reduced Phrase: 50-74% accurate   GO                    Osie Bond., M.A. [place] Acute Rehabilitation Services Pager [phone] Office [phone]  01/01/2020, 11:23 AM

## 2020-01-01 NOTE — Progress Notes (Signed)
NAME:  Gloria [name], MRN:  [id number], DOB:  [birth year], LOS: 3 ADMISSION DATE:  12/29/2019, CONSULTATION DATE:  12/29/2019 REFERRING MD:  Neurology, CHIEF COMPLAINT:  Left sided weakness  Brief History   74 year old woman with history of hypertension, A. fib not on anticoagulation presenting with left-sided weakness and right gaze preference. 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 Now s/p tPA, revascularization of right MCA M1 segment as well as placement of rescue stent in the right MCA.  Postprocedure has hematoma within the right inguinal region extending along the inguinal crease into the pannus of the right lower quadrant abdominal wall.  No extension into the retroperitoneal space.  History of present illness   74 year old woman with history of hypertension, A. fib not on anticoagulation presenting with left-sided weakness and right gaze preference. History obtained from EMR and nursing due to patient intubated/sedated.  Per EMR she was last known normal at [facility].  She was found by her brother when he returned from the store lying on the ground.  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  A code stroke was called.  CT head was negative for hemorrhage.  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 She was given tPA. CTA showed right M1 occlusion.  She underwent revascularization of right MCA M1 segment as well as placement of rescue stent in the right MCA.  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  She was sent for CT abdomen pelvis without contrast.  There is a hematoma within the right inguinal region extending along the inguinal crease into the pannus of the right lower quadrant abdominal wall.  No extension into the retroperitoneal space.  Past Medical History  hypertension, A. fib not on anticoagulation (due to high risk falls)  Hearing aid for left aid and needs glasses for lip riding primarily, does not sign Chronic right knee pain  Significant Hospital Events     6/27> admit, tPA, revascularization of right M1 occlusion as well as stent placement 6/29> extubated, now on RA Consults:  PCCM 6/27  Procedures:  ETT 6/27> Left Aline 6/27 >  Significant Diagnostic Tests:  CT abd/pelvis w/o contrast 6/27>>Hematoma within the right inguinal region extending along the inguinal crease into the pannus of the right lower quadrant abdominal wall. No extension into the retroperitoneal space.Bilateral ureteral stents. Stable bilateral adrenal adenoma.  CT head w/o contrast 6/27>>Hyperdense material in the right subarachnoid space, overlying the frontal operculum and within the Sylvian fissure. Unchanged left parafalcine meningioma.  CTA head/neck 6/27>>Occlusion of the right M1 MCA. However, there is good filling of the more distal MCA territory. No hemodynamically significant stenosis in the neck.  MRI Brain w/o contrast >> acute infarcts in right occipital cortex, right insular region, and frontal operculum. Small scattered other acute infarctions in right frontal and parietal regions. Swelling but no mass effect. Right MCA stent in place.  Micro Data:  COVID19 6/27>> neg  Antimicrobials:  None  Interim history/subjective:  No acute events overnight. She was successfully extubated on 6/29 and is on room air today.  Objective   Blood pressure (!) 118/56, pulse 62, temperature 98.9 F (37.2 C), temperature source Axillary, resp. rate (!) 23, weight 96.6 kg, SpO2 99 %.        Intake/Output Summary (Last 24 hours) at 01/01/2020 1010 Last data filed at 01/01/2020 0930 Gross per 24 hour  Intake 1322.62 ml  Output 1700 ml  Net -377.38 ml   Filed Weights   12/30/19 0234  12/31/19 0355 01/01/20 0500  Weight: 95.7 kg 96.7 kg 96.6 kg   Examination: General:  Elderly female in NAD. HEENT: MM pink/moist, ETT, OGT, pupils 3/reactive, some ecchymosis over right eyelid. Hearing aid in place. Neuro: Opens eyes spontaneously. Right gaze deviation. Moves RUE  and RLE spontaneously. Moves LLE spontaneously. Strength 0/5 in LUE. Follows simple commands but perseverates and cannot follow subsequent commands.  CV: Irregularly irregular rhythm, 2/6 holosystolic murmur loudest at apex, stable R groin site PULM:  Non labored, lungs clear  GI: obese, soft, bs active, purwick Extremities: warm/dry, no LE edema  Skin: no rashes   Assessment & Plan:  74 year old woman with history of hypertension, A. fib not on anticoagulation presenting with left-sided weakness and right gaze preference. 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 Now s/p tPA, revascularization of right MCA M1 segment as well as placement of rescue stent in the right MCA.  Postprocedure has hematoma within the right inguinal region extending along the inguinal crease into the pannus of the right lower quadrant abdominal wall.  No extension into the retroperitoneal space.  Patient able to follow simple commands. She was extubated yesterday and is resting comfortably on room air. Patient is stable at this time from a medical perspective. We will sign off and defer to neurology for further treatment recommendations.  Acute respiratory insufficiency; improving - extubated, now resting comfortably on room air - consult SLP - minimize sedation   MCA M1 occlusion, CVA:  -- telemetry monitoring in ICU -- continue Neosynephrine for BP goal per Neuro IR SBP 120-140 -- brillinta/ ASA per NIR -- PT/ OT/ SLP when appropriate  -- continue frequent neuro checks  -- Frequent neuro checks  Post procedure hematoma: distal pulses remain intact, site with slightly increased echymossis as would be expected as hematoma resolves. Hemoglobin today 7.3, decreased from 7.8. Patient receiving 1 unit of blood. -f/u H/H after transfusion  History of atrial fibrillation, HTN: CHA2DS2-VASc score of at least 5 - previously not on AC due to high risk of falls - Will need to be started on anticoagulation eventually, can  discuss benefits with family  HOH - brother states she does not use sign language, requires her left hearing aid and glasses and primarily read lips  Hematuria with ureteral stent exchange 6/23 2/2 bilateral renal stones - UOP remains adequate with stable renal function - urology consulted by primary team, as chart review patient planned for stent removal in 1 week from 6/23  Best practice:  Diet: NPO Pain/Anxiety/Delirium protocol (if indicated): Propofol gtt, fent prn VAP protocol (if indicated): Ordered DVT prophylaxis: SCDs GI prophylaxis: PPI Glucose control: Monitor Mobility: PT/OT when able Code Status: FULL Family Communication: brother, [name] updated by phone.  Disposition:  Neuro ICU  Labs   CBC: Recent Labs  Lab 12/29/19 1802 12/29/19 1846 12/29/19 2300 12/30/19 0134 12/30/19 0500 12/31/19 0342 01/01/20 0615  WBC 5.8  --   --   --  7.8 10.4 6.0  NEUTROABS 3.9  --   --   --  7.3  --   --   HGB 11.8*   < > 9.2* 8.8* 8.4* 7.8* 7.3*  HCT 38.3   < > 27.0* 26.0* 26.6* 24.7* 23.5*  MCV 104.4*  --   --   --  103.1* 103.8* 104.9*  PLT 225  --   --   --  200 223 157   < > = values in this interval not displayed.    Basic Metabolic Panel: Recent  Labs  Lab 12/29/19 1802 12/29/19 1802 12/29/19 1846 12/29/19 1846 12/29/19 2300 12/30/19 0134 12/30/19 0500 12/31/19 0342 12/31/19 1211 12/31/19 1655 01/01/20 0615  NA 141   < > 140   < > 139 141 141 141  --   --  142  K 5.3*   < > 5.2*   < > 4.4 4.3 4.5 4.7  --   --  4.3  CL 106   < > 106  --  108  --  113* 112*  --   --  112*  CO2 24  --   --   --   --   --  22 19*  --   --  22  GLUCOSE 109*   < > 103*  --  134*  --  149* 103*  --   --  125*  BUN 32*   < > 34*  --  26*  --  28* 33*  --   --  32*  CREATININE 1.76*   < > 1.90*  --  1.50*  --  1.44* 1.88*  --   --  1.64*  CALCIUM 8.9  --   --   --   --   --  7.9* 8.0*  --   --  8.1*  MG  --   --   --   --   --   --   --  1.9 1.8 1.8 1.8  PHOS  --   --   --   --    --   --   --   --  4.0 3.5 3.3   < > = values in this interval not displayed.   GFR: Estimated Creatinine Clearance: 32.6 mL/min (A) (by C-G formula based on SCr of 1.64 mg/dL (H)). Recent Labs  Lab 12/29/19 1802 12/30/19 0500 12/31/19 0342 01/01/20 0615  WBC 5.8 7.8 10.4 6.0    Liver Function Tests: Recent Labs  Lab 12/29/19 1802  AST 17  ALT 11  ALKPHOS 82  BILITOT 0.6  PROT 6.2*  ALBUMIN 3.2*   ABG    Component Value Date/Time   PHART 7.354 12/30/2019 0134   PCO2ART 39.7 12/30/2019 0134   PO2ART 242 (H) 12/30/2019 0134   HCO3 22.3 12/30/2019 0134   TCO2 24 12/30/2019 0134   ACIDBASEDEF 3.0 (H) 12/30/2019 0134   O2SAT 100.0 12/30/2019 0134     Coagulation Profile: Recent Labs  Lab 12/29/19 1802  INR 1.1   CBG: Recent Labs  Lab 12/31/19 1612 12/31/19 1935 12/31/19 2341 01/01/20 0339 01/01/20 0810  GLUCAP 92 120* 119* 120* 120*      Riccardo Dubin, MS4   Critical care attending attestation note:  Patient seen and examined and relevant ancillary tests reviewed.  I agree with the assessment and plan of care as outlined by Riccardo Dubin, MS 4.   Synopsis of assessment and plan:  69 frail woman with recent right MCA stroke.  Successfully extubated yesterday.  Being transfused for hemoglobin 7.3.  On examination awake following some some commands.  Moving right side spontaneously.  Left is weak.  Chest clear heart sounds unremarkable.  Abdomen soft. No additional ecchymosis or hematoma expansion at prior cath site.  Active issues  Acute respiratory failure now resolved. Right MCA stroke status post revascularization. Acute blood loss anemia.  Plan  Progress stroke care. Secondary stroke prevention. Transfuse PRBC.  Likely dilutional component as well. If good response to transfusion, appropriate for transfer to stepdown.  Kipp Brood, MD [facility] ICU Physician [facility]  Pager: [phone] Mobile:  [phone] After hours: [phone].  01/01/2020, 11:36 AM

## 2020-01-01 NOTE — Progress Notes (Signed)
Right groin limited duplex       has been completed. Preliminary results can be found under CV proc through chart review. Malak Duchesneau, BS, RDMS, RVT   

## 2020-01-01 NOTE — Progress Notes (Signed)
Physical Therapy Treatment Patient Details Name: Gloria [name] MRN: [id number] DOB: [birth year] Today's Date: 01/01/2020    History of Present Illness This 74 y.o. female admitted with flaccid Lt side and Rt gaze preference.  Pt received tPA.  CTA revealed Rt M1 occlusion and she was taken for emergent thrombectomy.  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  She was extubated 12/31/2019. PMH:  Pt is very HOH - reads lips, HTN, h/o kidney stones, A-Fib; s/p multiple cystocopies with stent placement.      PT Comments    Pt tolerated being positioned in high chair mode again today and was very engaged when her brothers Jeani Hawking and [name] arrived.  She was better able to understand Jeani Hawking (reading lips with glasses donned).  She demonstrated good memory of events surrounding her hospitalization, but continued poor awareness of her current situation (stroke and what that means to her ie L sided weakness).  Family participation increased pt's response exponentially.  She was able to track more to the left (one brief moment to midline with head and gaze.  She was able to verbalize a few needs ("get this nasty stuff out of my mouth" and "I want ice water") and then use the younker with her right hand to preform self suctioning (decreased proprioception as she had difficulty getting it to her mouth the first try).   She remains appropriate for extensive post acute therapy.   Follow Up Recommendations  CIR     Equipment Recommendations  Wheelchair (measurements PT);Wheelchair cushion (measurements PT);Hospital bed    Recommendations for Other Services Rehab consult     Precautions / Restrictions Precautions Precautions: Fall;Other (comment) Precaution Comments: Rt gaze preference with Lt neglect, essentially deaf Required Braces or Orthoses: Other  Brace Other Brace: does best with glasses donned and clear facemask so she can read your lips    Mobility  Bed Mobility Overal bed mobility: Needs Assistance             General bed mobility comments: total assist.  Used high chair mode again today to work with her.  Incorporated family participation in her arousal, cognitive assessment, and ability to verbalize a need and participate in self care (using the [place] to suction out her own mouth).   Transfers                    Ambulation/Gait                 Stairs             Wheelchair Mobility    Modified Rankin (Stroke Patients Only) Modified Rankin (Stroke Patients Only) Pre-Morbid Rankin Score: Slight disability Modified Rankin: Severe disability     Balance                                            Cognition Arousal/Alertness: Awake/alert;Lethargic Behavior During Therapy: Flat affect Overall Cognitive Status: Impaired/Different from baseline Area of Impairment: Safety/judgement;Awareness                   Current Attention Level: Sustained     Safety/Judgement: Decreased awareness of safety;Decreased awareness of deficits Awareness: Intellectual   General Comments: PT responded very well to her  son, Jeani Hawking.  He took his mask down and was able to communicate well with her after her glasses were donned.  She was able to relay the events leading up to the hospitalization, but does not understand that she has had a stroke. She is able to state their names, names of pets and other family when presented with pictures.  She knows she is at the hospital and that she fell.  She cannot relay her left sided weakness.       Exercises      General Comments General comments (skin integrity, edema, etc.): Brothers reported that she normally has very painful bil knees and lower legs.  She does not like anything to touch them at baseline.  She walked with two canes PTA and  lived alone (son, [name] next door). She continues to have reflexive movement (spontaneous and to stimulation or movement of the leg) on the left leg and flaccid L arm.  She was able to come closer to midline with her head and gaze than yesterday and reached across midline once.  She allowed me to preform some gentle ROM for left cervical rotation.        Pertinent Vitals/Pain Pain Assessment: Faces Faces Pain Scale: Hurts little more Pain Location: generalized pain in her knees and lower legs, sons report this is baseline.  Pain Descriptors / Indicators: Burning Pain Intervention(s): Limited activity within patient's tolerance;Monitored during session;Repositioned    Home Living                      Prior Function            PT Goals (current goals can now be found in the care plan section) Acute Rehab PT Goals Patient Stated Goal: to go home Progress towards PT goals: Progressing toward goals    Frequency    Min 4X/week      PT Plan Current plan remains appropriate    Co-evaluation              AM-PAC PT "6 Clicks" Mobility   Outcome Measure  Help needed turning from your back to your side while in a flat bed without using bedrails?: Total Help needed moving from lying on your back to sitting on the side of a flat bed without using bedrails?: Total Help needed moving to and from a bed to a chair (including a wheelchair)?: Total Help needed standing up from a chair using your arms (e.g., wheelchair or bedside chair)?: Total Help needed to walk in hospital room?: Total Help needed climbing 3-5 steps with a railing? : Total 6 Click Score: 6    End of Session   Activity Tolerance: Patient limited by fatigue Patient left: in bed;with call bell/phone within reach;with bed alarm set Nurse Communication: Mobility status PT Visit Diagnosis: Muscle weakness (generalized) (M62.81);Difficulty in walking, not elsewhere classified (R26.2);Hemiplegia and  hemiparesis Hemiplegia - Right/Left: Left Hemiplegia - dominant/non-dominant: Non-dominant Hemiplegia - caused by: Cerebral infarction     Time: 1600-1705 PT Time Calculation (min) (ACUTE ONLY): 65 min  Charges:  $Therapeutic Exercise: 8-22 mins $Therapeutic Activity: 8-22 mins $Self Care/Home Management: 23-37                      Verdene Lennert, PT, DPT  Acute Rehabilitation [phone] pager [phone]) [phone] office

## 2020-01-01 NOTE — Progress Notes (Addendum)
Referring Physician(s): Code Stroke- Aroor, Sushanth R  Supervising Physician: Luanne Bras  Patient Status:  [name] - In-pt  Chief Complaint: None  Subjective:  History of acute CVA s/p cerebral arteriogram with emergent mechanical thrombectomy of right MCA M1 occlusion achieving a TICI 2b revascularization, along with revascularization of right MCA stenosis using rescue stent placement via right femoral approach 12/29/2019 by Dr. Estanislado Pandy. Patient laying in bed resting comfortably. She opens eyes to gentle touch (deaf) but quickly falls back asleep. Does not follow simple commands. Can spontaneously move right side, left side withdraws from pain.   Allergies: Aspartame and phenylalanine, Azithromycin, Bee venom, Cortizone-5 [hydrocortisone], Olmesartan, Other, Penicillins, Valsartan, and Latex  Medications: Prior to Admission medications   Medication Sig Start Date End Date Taking? Authorizing Provider  aspirin 325 MG tablet Take 325 mg by mouth daily.   Yes [provider]  carvedilol (COREG) 6.25 MG tablet Take 6.25 mg by mouth 2 (two) times daily. 11/08/19  Yes [provider]  furosemide (LASIX) 20 MG tablet Take 1 tablet (20 mg total) by mouth as needed for fluid. Patient taking differently: Take 20 mg by mouth daily as needed for fluid.  11/15/19  Yes Pokhrel, Laxman, MD  magnesium oxide (MAG-OX) 400 MG tablet Take 400 mg by mouth 2 (two) times daily. 11/12/19  Yes [provider]  traMADol (ULTRAM) 50 MG tablet Take 1-2 tablets (50-100 mg total) by mouth every 6 (six) hours as needed for moderate pain. 12/06/19  Yes Ardis Hughs, MD  mirabegron ER (MYRBETRIQ) 25 MG TB24 tablet Take 1 tablet (25 mg total) by mouth daily. 11/22/19   Ardis Hughs, MD     Vital Signs: BP (!) 118/56   Pulse 62   Temp 98.9 F (37.2 C) (Axillary)   Resp (!) 23   Wt 212 lb 15.4 oz (96.6 kg)   SpO2 99%   BMI 38.95 kg/m   Physical Exam Vitals and  nursing note reviewed.  Constitutional:      General: She is not in acute distress. Pulmonary:     Effort: Pulmonary effort is normal. No respiratory distress.  Skin:    General: Skin is warm and dry.     Comments: Right groin puncture site with palpable hematoma, more inferior to puncture site today with tenderness and ecchymosis.  Neurological:     Comments: Lethargic but opens eyes to gentle touch (deaf) and quickly falls back asleep. Does not follow simple commands. Can spontaneously move right side, left side withdraws from pain. Distal pulses (DPs) 1+ bilaterally.     Imaging: CT ABDOMEN PELVIS WO CONTRAST  Result Date: 12/29/2019 CLINICAL DATA:  Right inguinal hematoma, recent neuro interventional procedure, assess for retroperitoneal hematoma EXAM: CT ABDOMEN AND PELVIS WITHOUT CONTRAST TECHNIQUE: Multidetector CT imaging of the abdomen and pelvis was performed following the standard protocol without IV contrast. COMPARISON:  11/08/2019 FINDINGS: Lower chest: Hypoventilatory changes are seen within the dependent lower lobes. Heart is enlarged with trace pericardial fluid unchanged. Hepatobiliary: Gallbladder surgically absent. Unenhanced imaging the liver demonstrates no gross abnormalities. Pancreas: Unremarkable. No pancreatic ductal dilatation or surrounding inflammatory changes. Spleen: Normal in size without focal abnormality. Adrenals/Urinary Tract: Excreted contrast is seen within the bilateral kidneys related to previous neuro interventional procedure. Bilateral renal cortical thinning is noted. Stable bilateral adrenal adenoma. Bilateral ureteral stents extend from the renal pelves into the bladder lumen. No filling defects within the bladder. Stomach/Bowel: Enteric catheter extends into the gastric lumen. No bowel  obstruction or ileus. No bowel wall thickening or inflammatory change. Vascular/Lymphatic: Aortic atherosclerosis. No enlarged abdominal or pelvic lymph nodes.  Reproductive: Uterus and bilateral adnexa are unremarkable. Other: Hematoma is seen within the right inguinal region extending along the inguinal crease into the pannus of the right lower quadrant abdominal wall. This measures up to 3.7 cm in thickness. There is no evidence of extension into the retroperitoneal space. At the time of the exam, extrinsic compression is being held at the right groin. There is no free fluid or free gas within the peritoneal cavity. Fat containing hernia again noted within the left lower quadrant abdominal wall. No bowel herniation. Musculoskeletal: No acute or destructive bony lesions. Reconstructed images demonstrate no additional findings. IMPRESSION: 1. Hematoma within the right inguinal region extending along the inguinal crease into the pannus of the right lower quadrant abdominal wall. No extension into the retroperitoneal space. 2. Bilateral ureteral stents as above. 3. Stable bilateral adrenal adenoma. 4. Aortic Atherosclerosis (ICD10-I70.0). Electronically Signed   By: Randa Ngo M.D.   On: 12/29/2019 23:48   CT Code Stroke CTA Head W/WO contrast  Result Date: 12/29/2019 CLINICAL DATA:  Left-sided weakness, code stroke follow-up EXAM: CT ANGIOGRAPHY HEAD AND NECK TECHNIQUE: Multidetector CT imaging of the head and neck was performed using the standard protocol during bolus administration of intravenous contrast. Multiplanar CT image reconstructions and MIPs were obtained to evaluate the vascular anatomy. Carotid stenosis measurements (when applicable) are obtained utilizing NASCET criteria, using the distal internal carotid diameter as the denominator. CONTRAST:  18mL OMNIPAQUE IOHEXOL 350 MG/ML SOLN COMPARISON:  None. FINDINGS: CTA NECK Aortic arch: Minimal calcified plaque along the aortic arch. Mild calcified plaque at the great vessel origins, which are patent. Right carotid system: Patent. Common carotid has a retropharyngeal course. Mild calcified plaque at the  ICA origin causing minimal stenosis. Left carotid system: Patent. Common carotid has a retropharyngeal course. Mild calcified plaque at the ICA origin causing minimal stenosis. Vertebral arteries: Patent. Left vertebral artery is dominant. Suspected stenosis of the proximal right vertebral artery. Skeleton: Degenerative changes of the cervical spine. Other neck: No mass or adenopathy. Upper chest: Enlargement of the main pulmonary artery suggesting pulmonary arterial hypertension. Review of the MIP images confirms the above findings CTA HEAD Anterior circulation: Intracranial internal carotid arteries are patent with calcified plaque causing mild stenosis. Anterior cerebral arteries are patent. Left A1 ACA is dominant with diminutive or absent right A1 segment. There is occlusion of the mid right M1 MCA. There is preserved opacification of the more distal right MCA territory. Left MCA is patent. Posterior circulation: Intracranial vertebral arteries are patent. Basilar artery is patent. Posterior cerebral arteries are patent. There are posterior communicating arteries present bilaterally with fetal origin of the right PCA. Venous sinuses: Patent as allowed by contrast bolus timing. Review of the MIP images confirms the above findings IMPRESSION: Occlusion of the right M1 MCA. However, there is good filling of the more distal MCA territory. No hemodynamically significant stenosis in the neck. These results were communicated to Dr. Lorraine Lax at [place] 6/27/2021by text page via the [facility] messaging system. Electronically Signed   By: Macy Mis M.D.   On: 12/29/2019 19:10   CT HEAD WO CONTRAST  Result Date: 12/29/2019 CLINICAL DATA:  Stroke follow-up.  Status post thrombectomy EXAM: CT HEAD WITHOUT CONTRAST TECHNIQUE: Contiguous axial images were obtained from the base of the skull through the vertex without intravenous contrast. COMPARISON:  Head CT 12/29/2019 FINDINGS: Brain: There  is hyperdense material in the  right subarachnoid space, overlying the frontal operculum and within the sylvian fissure. No other extra-axial collection. No midline shift or other mass effect. No intraparenchymal hemorrhage. 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 Vascular: Status post right MCA stent placement. Skull: Normal. Negative for fracture or focal lesion. Sinuses/Orbits: No acute finding. Other: None. IMPRESSION: 1. Hyperdense material in the right subarachnoid space, overlying the frontal operculum and within the Sylvian fissure. This is most likely contrast staining, but follow-up studies will be necessary to differentiate from subarachnoid hemorrhage. 2. Unchanged left parafalcine meningioma. Electronically Signed   By: Ulyses Jarred M.D.   On: 12/29/2019 23:49   CT Code Stroke CTA Neck W/WO contrast  Result Date: 12/29/2019 CLINICAL DATA:  Left-sided weakness, code stroke follow-up EXAM: CT ANGIOGRAPHY HEAD AND NECK TECHNIQUE: Multidetector CT imaging of the head and neck was performed using the standard protocol during bolus administration of intravenous contrast. Multiplanar CT image reconstructions and MIPs were obtained to evaluate the vascular anatomy. Carotid stenosis measurements (when applicable) are obtained utilizing NASCET criteria, using the distal internal carotid diameter as the denominator. CONTRAST:  85mL OMNIPAQUE IOHEXOL 350 MG/ML SOLN COMPARISON:  None. FINDINGS: CTA NECK Aortic arch: Minimal calcified plaque along the aortic arch. Mild calcified plaque at the great vessel origins, which are patent. Right carotid system: Patent. Common carotid has a retropharyngeal course. Mild calcified plaque at the ICA origin causing minimal stenosis. Left carotid system: Patent. Common carotid has a retropharyngeal course. Mild calcified plaque at the ICA origin causing minimal stenosis. Vertebral arteries: Patent. Left vertebral artery is dominant. Suspected stenosis of the proximal right vertebral artery. Skeleton:  Degenerative changes of the cervical spine. Other neck: No mass or adenopathy. Upper chest: Enlargement of the main pulmonary artery suggesting pulmonary arterial hypertension. Review of the MIP images confirms the above findings CTA HEAD Anterior circulation: Intracranial internal carotid arteries are patent with calcified plaque causing mild stenosis. Anterior cerebral arteries are patent. Left A1 ACA is dominant with diminutive or absent right A1 segment. There is occlusion of the mid right M1 MCA. There is preserved opacification of the more distal right MCA territory. Left MCA is patent. Posterior circulation: Intracranial vertebral arteries are patent. Basilar artery is patent. Posterior cerebral arteries are patent. There are posterior communicating arteries present bilaterally with fetal origin of the right PCA. Venous sinuses: Patent as allowed by contrast bolus timing. Review of the MIP images confirms the above findings IMPRESSION: Occlusion of the right M1 MCA. However, there is good filling of the more distal MCA territory. No hemodynamically significant stenosis in the neck. These results were communicated to Dr. Lorraine Lax at [place] 6/27/2021by text page via the [facility] messaging system. Electronically Signed   By: Macy Mis M.D.   On: 12/29/2019 19:10   MR ANGIO HEAD WO CONTRAST  Result Date: 12/30/2019 CLINICAL DATA:  Status post right M1 occlusion with intervention EXAM: MRI HEAD WITHOUT CONTRAST MRA HEAD WITHOUT CONTRAST TECHNIQUE: Multiplanar, multiecho pulse sequences of the brain and surrounding structures were obtained without intravenous contrast. Angiographic images of the head were obtained using MRA technique without contrast. COMPARISON:  Multiple CT studies done yesterday. FINDINGS: MRI HEAD FINDINGS Brain: Diffusion imaging shows acute infarction affecting the right occipital cortex. Acute infarction affects the right insular region and frontal operculum. Small foci of acute  infarction elsewhere in the right frontal and parietal regions. Areas of infarction show mild swelling but there is no mass effect. Small focus of susceptibility in  the right sylvian fissure region may be intravascular. Few small foci of susceptibility in the right parietal region also suspected to be intravascular. No sign of parenchymal hematoma. No hydrocephalus. No extra-axial collection. Mild chronic small-vessel ischemic changes elsewhere throughout the cerebral hemispheric white matter. 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 Vascular: Stent in the right M1 region. Skull and upper cervical spine: Negative Sinuses/Orbits: Clear/normal Other: None MRA HEAD FINDINGS Both internal carotid arteries show antegrade flow through the skull base. On the left, there is supply in the left MCA territory and both anterior cerebral artery territories. Moderate atherosclerotic irregularity of the MCA branches. On the right, there is signal loss related to the right MCA stent. More distal MCA branch vessels do show supply, with absence of the superior division. Right vertebral artery terminates in PICA. Left vertebral artery supplies the basilar. 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 Left PCA arises from the basilar tip. Distal vessel atherosclerotic irregularity. Right PCA arises from the right carotid and shows poor supply of the distal branches. 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 Fetal origin right PCA with poor flow in the distal branch vessels. Acute infarction of the right insular region and frontal operculum. Missing superior division right MCA. Small scattered other acute infarctions in the right frontal and parietal regions. Swelling but no mass effect. No intraparenchymal hematoma. Few small foci of petechial blood versus vascular susceptibility signal. Right MCA stent. Missing superior  division as noted above. Inferior division shows flow. Electronically Signed   By: Nelson Chimes M.D.   On: 12/30/2019 19:21   MR BRAIN WO CONTRAST  Result Date: 12/30/2019 CLINICAL DATA:  Status post right M1 occlusion with intervention EXAM: MRI HEAD WITHOUT CONTRAST MRA HEAD WITHOUT CONTRAST TECHNIQUE: Multiplanar, multiecho pulse sequences of the brain and surrounding structures were obtained without intravenous contrast. Angiographic images of the head were obtained using MRA technique without contrast. COMPARISON:  Multiple CT studies done yesterday. FINDINGS: MRI HEAD FINDINGS Brain: Diffusion imaging shows acute infarction affecting the right occipital cortex. Acute infarction affects the right insular region and frontal operculum. Small foci of acute infarction elsewhere in the right frontal and parietal regions. Areas of infarction show mild swelling but there is no mass effect. Small focus of susceptibility in the right sylvian fissure region may be intravascular. Few small foci of susceptibility in the right parietal region also suspected to be intravascular. No sign of parenchymal hematoma. No hydrocephalus. No extra-axial collection. Mild chronic small-vessel ischemic changes elsewhere throughout the cerebral hemispheric white matter. 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 Vascular: Stent in the right M1 region. Skull and upper cervical spine: Negative Sinuses/Orbits: Clear/normal Other: None MRA HEAD FINDINGS Both internal carotid arteries show antegrade flow through the skull base. On the left, there is supply in the left MCA territory and both anterior cerebral artery territories. Moderate atherosclerotic irregularity of the MCA branches. On the right, there is signal loss related to the right MCA stent. More distal MCA branch vessels do show supply, with absence of the superior division. Right vertebral artery  terminates in PICA. Left vertebral artery supplies the basilar. 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 Left PCA arises from the basilar tip. Distal vessel atherosclerotic irregularity. Right PCA arises from the right carotid and shows poor supply of the distal branches. 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 Fetal origin right PCA with poor flow in the distal branch vessels. Acute infarction of the right insular region and frontal operculum. Missing superior division right MCA. Small scattered other acute infarctions in the right frontal and parietal regions. Swelling but no mass effect. No intraparenchymal hematoma. Few small foci of petechial blood versus vascular susceptibility signal. Right MCA stent. Missing superior division as noted above. Inferior division shows flow. Electronically Signed   By: Nelson Chimes M.D.   On: 12/30/2019 19:21   IR Intra Cran Stent  Result Date: 12/31/2019 INDICATION: New onset left-sided hemiplegia, right gaze deviation. Occluded right middle cerebral M1 segment on CT angiogram of the head and neck. EXAM: 1. EMERGENT LARGE VESSEL OCCLUSION THROMBOLYSIS (anterior CIRCULATION) COMPARISON:  CT angiogram of the head and neck of June, 27, 2021. MEDICATIONS: Vancomycin 1 g IV was administered within 1 hour of the procedure. ANESTHESIA/SEDATION: General anesthesia CONTRAST:  Isovue 300 approximately 170 mL FLUOROSCOPY TIME:  Fluoroscopy Time: 131 minutes 12 seconds (4835 mGy). COMPLICATIONS: None immediate. TECHNIQUE: Following a full explanation of the procedure along with the potential associated complications, an informed witnessed consent was obtained the patient's son. The risks of intracranial hemorrhage of 10%, worsening neurological deficit, ventilator dependency, death and inability to revascularize were all reviewed in detail with the patient's son. The patient was then put under general anesthesia by the Department of Anesthesiology  at [facility]. The right groin was prepped and draped in the usual sterile fashion. Thereafter using modified Seldinger technique, transfemoral access into the right common femoral artery was obtained without difficulty. Over a 0.035 inch guidewire an 8 French 25 cm Pinnacle sheath was inserted. Through this, and also over a 0.035 inch guidewire a combination of a select 5 French 125 cm Simmons 2 catheter inside of a 95 cm 087 balloon guide catheter was advanced without difficulty to the right common carotid artery. The guidewire and the select catheter were removed. Good aspiration was obtained from the hub of the balloon guide catheter just proximal to the right common carotid artery bifurcation. Arteriogram was then performed centered extra cranially and intracranially. FINDINGS: The right common carotid arteriogram demonstrates the right external carotid artery and its major branches to be widely patent. The right internal carotid artery at the bulb demonstrates a smooth shallow plaque. Moderate tortuosity was noted of the proximal right internal carotid artery. 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 Distal to this the cervical petrous junction demonstrates normal opacification. Distal to this the petrous, cavernous, and the cavernous segments demonstrate patency as does the supraclinoid segment. Opacification is seen of the right posterior communicating artery opacifying the right posterior cerebral distribution. The right middle cerebral artery demonstrates complete angiographic occlusion at the origin of the anterior temporal branch. Focal areas of caliber irregularity are seen of the proximal right middle cerebral artery. PROCEDURE: Through the 087 balloon guide catheter  in the proximal right internal carotid artery, a Zoom 071 135 cm catheter inside of which was an 021 160 cm Phenom microcatheter was advanced over a 0.014 inch standard Synchro micro guidewire gingerly through the right internal carotid artery in the neck and into the supraclinoid right ICA. The micro guidewire was then gently manipulated with a torque device and advanced into the inferior division M2 M3 region of the  right middle cerebral artery followed by the microcatheter. The guidewire was removed. Good aspiration was obtained from the hub of the microcatheter. Gentle contrast injection demonstrated safe position of tip of the microcatheter. A 5 mm x 37 mm Embotrap retrieval device was then advanced to the distal end of the microcatheter. The O ring on the delivery micro guidewire was loosened. With slight forward gentle traction with the right hand on the delivery micro guidewire, with the left hand the retrieval device was deployed. The 071 Zoom catheter was then advanced into the right middle cerebral artery engaging the occluded right middle cerebral artery. With constant flow arrest in the right internal carotid artery, and constant aspiration using a Penumbra device at the hub of the 071 Zoom catheter for approximately 3 minutes, the combination of the retrieval device, the microcatheter, and the Zoom aspiration catheter were retrieved and removed. Following reversal of flow arrest, a control arteriogram performed through the balloon guide catheter in the right internal carotid artery demonstrates revascularization of the right middle cerebral artery and also of the superior and inferior divisions. The anterior temporal branch remains patent. A TICI 2B revascularization was achieved. This also unmasked an irregular filling defect at the right middle cerebral artery terminus at the trifurcation region with flow noted in the distal inferior division. A second pass was then made again with the combination of  the 071 Zoom catheter advanced over an 021 160 cm Phenom microcatheter over a 0.014 inch standard Synchro micro guidewire. Again access was obtained into the distal M2 M3 region of the inferior division with the micro guidewire then followed by the microcatheter. The guidewire was removed. Again safe position of the tip of the microcatheter was confirmed and connected to continuous heparinized saline infusion. A 4 mm x 40 mm Solitaire X retrieval device was then deployed in the manner described above. Again with proximal flow arrest in the right internal carotid artery proximally, and constant aspiration via the Zoom catheter which was now imbedded in the occluded right middle cerebral artery at its trifurcation region over approximately 2-1/2 minutes, the combination of the retrieval device, the microcatheter, and the Zoom catheter was retrieved and removed. Following reversal of flow arrest, a control arteriogram performed through the right internal carotid artery demonstrated improved opacification of the right MCA trifurcation branches. There continued be occluded superior division. The combination of the microcatheter, inside a 071 Zoom aspiration catheter was again advanced to the right middle cerebral artery over a 0.014 inch standard Synchro micro guidewire, which was now advanced into the superior division of the right middle cerebral artery emanating at the MCA trifurcation region. The microcatheter was advanced to the M2 region over a micro guidewire. The micro guidewire was removed. Good aspiration obtained from the hub of the microcatheter. Again with flow arrest in the right internal carotid artery proximally, and constant aspiration with the Penumbra aspiration device at the hub of the aspiration catheter following deployment of a 3 mm x 20 mm Solitaire X retrieval device, for 2 minutes, the combination of the retrieval device, the microcatheter and the Zoom catheter were retrieved and removed. The  superior division continued to be occluded. The superior division was again cannulated with a microcatheter over a micro guidewire as described above. After having verified safe position of the tip of the microcatheter, in the superior division M2 region, a 4 mm x 40 mm Solitaire X retrieval device was deployed in the usual manner. Again with proximal flow arrest in the  proximal right ICA, and constant aspiration at the hub of the Zoom catheter at the site of the occluded superior division in the distal right middle cerebral M1 segment over 2 minutes, the combination of the retrieval device, the microcatheter and the retrieval device were retrieved and removed. Following reverse of flow arrest, there appeared to be modest improved flow in the proximal superior division. It was now noted the inferior division had a near occlusive clot in the proximal aspect. A fourth pass was then made this time using a combination of the Phenom microcatheter, inside of a 6 [place] Catalyst 135 cm catheter advanced over a 0.014 inch standard Synchro micro guidewire to the distal right M1 segment. The micro guidewire was then advanced into the M2 M3 region of the inferior division followed by the microcatheter. The guidewire was removed. A 4 mm x 40 mm Solitaire X retrieval device was deployed with the Catalyst guide catheter advanced at the origin of inferior division and distal to this. Following proximal flow arrest and constant aspiration for approximately 2 minutes at the hub of the Catalyst guide catheter, the combination of the retrieval device, the microcatheter and the Catalyst catheter were retrieved and removed following reversal of flow arrest. Improved flow was now noted into the inferior division proximally through the two main branch points. Following each thrombectomy, strips of fragments were seen intertwined either in the retrieval device, or in the aspiration catheters. 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 It was therefore decided to proceed with placement of a rescue stent in order to prevent occlusion of the major right MCA inferior division branch. The microcatheter was again advanced over a 0.014 inch standard Synchro micro guidewire with a 6 [place] Catalyst guide catheter. The microcatheter was advanced over a micro guidewire without difficulty into the M2 region followed by the removal of the micro guidewire. Again good aspiration obtained from the hub of the microcatheter. This was then connected to continuous heparinized saline infusion. Measurements performed of the right middle cerebral artery in the mid M1 segment, and also the proximal inferior division distal to the severe stenosis. A 4 mm x 24 mm Neuroform Atlas stent was advanced to the distal end of the microcatheter. The O ring on the delivery microcatheter was then loosened. With slight forward gentle traction with the right hand on the delivery micro guidewire with left hand the delivery microcatheter was gently retrieved unsheathing the distal and then the proximal portion of the stent with excellent coverage at the site of the severe stenosis. The delivery apparatus was removed. A control arteriogram performed through the balloon guide in the distal cervical ICA on the right demonstrated now significantly improved caliber and flow through the right middle cerebral artery the dominant inferior division. There was poor stagnant flow noted in the proximal superior division. Free flow through the stented segment and also the other branch of the right middle cerebral artery except for the superior division. 8 mg of Integrilin was given intra-arterially in order to prevent intra stent platelet aggregation. A 10 minute post stent arteriogram continued to demonstrate flow through the stented segment in the right MCA distribution except for the non dominant superior division. A  TICI 2b revascularization was maintained. The right posterior communicating artery with the right posterior cerebral artery distribution remained unchanged. Throughout the procedure, the patient's blood pressure and neurological status remained stable. A CT of the brain performed following the third pass  demonstrated no mass-effect or midline shift with mild element of contrast in the right perisylvian region. The balloon guide was then retrieved and removed. The 8 French 25 cm Pinnacle sheath was then exchanged over a 0.035 inch J-tip guidewire for a short 8 [place] Pinnacle sheath. An arteriogram performed through this demonstrated spasm in the previously noted tortuous right common iliac artery. There was small amount of contrast noted in the soft tissue at the site of entry of the 8 [place] Pinnacle sheath which completely disappeared on repeat arteriogram performed approximately 3 minutes later. The 8 French sheath was removed and a 7 [place] ExoSeal closure device was placed. Also minimal compression was held for approximately 25 minutes. The distal pulses remained Dopplerable in the dorsalis pedis, and the posterior tibial regions bilaterally. Patient was left intubated on account of the patient's inability to communicate due to difficulty hearing and also her neurological condition. During this time, while pressure was held, and firmness was felt in the region of the pannus on the right side in the right lower quadrant, and also in the right inguinal region. In view of the unstable blood pressure, the patient was sent to CT scan for CT of the pelvis and also of the abdomen and also CT of the brain. CT of the brain revealed no evidence of mass effect or midline shift with no significant change in the right perisylvian hyperattenuation felt to be due to a contrast stain. CT of the abdomen and pelvis revealed no evidence of the overlying skin tightness. Hemodynamically the patient was now stable with blood  pressure in the 150s over 80s and the heart rate being regular in the 80s to 90s sinus rhythm. Patient was then transferred to the neuro ICU intubated for post thrombectomy management. IMPRESSION: Status post endovascular revascularization of occluded right middle cerebral M1 segment with 4 passes with different stent retrievers and Penumbra aspiration achieving a TICI 2B revascularization. Status post rescue stent placement from the distal M1 segment into the proximal dominant inferior division of the right middle cerebral artery due to underlying severe intracranial arteriosclerosis. PLAN: Follow-up in the clinic 4 weeks post discharge. Electronically Signed   By: Luanne Bras M.D.   On: 12/30/2019 17:15   IR CT Head Ltd  Result Date: 12/31/2019 INDICATION: New onset left-sided hemiplegia, right gaze deviation. Occluded right middle cerebral M1 segment on CT angiogram of the head and neck. EXAM: 1. EMERGENT LARGE VESSEL OCCLUSION THROMBOLYSIS (anterior CIRCULATION) COMPARISON:  CT angiogram of the head and neck of June, 27, 2021. MEDICATIONS: Vancomycin 1 g IV was administered within 1 hour of the procedure. ANESTHESIA/SEDATION: General anesthesia CONTRAST:  Isovue 300 approximately 170 mL FLUOROSCOPY TIME:  Fluoroscopy Time: 131 minutes 12 seconds (4835 mGy). COMPLICATIONS: None immediate. TECHNIQUE: Following a full explanation of the procedure along with the potential associated complications, an informed witnessed consent was obtained the patient's son. The risks of intracranial hemorrhage of 10%, worsening neurological deficit, ventilator dependency, death and inability to revascularize were all reviewed in detail with the patient's son. The patient was then put under general anesthesia by the Department of Anesthesiology at [facility]. The right groin was prepped and draped in the usual sterile fashion. Thereafter using modified Seldinger technique, transfemoral access into the right  common femoral artery was obtained without difficulty. Over a 0.035 inch guidewire an 8 [place] 25 cm Pinnacle sheath was inserted. Through this, and also over a 0.035 inch guidewire a combination of a select 5 [place] 125  cm Simmons 2 catheter inside of a 95 cm 087 balloon guide catheter was advanced without difficulty to the right common carotid artery. The guidewire and the select catheter were removed. Good aspiration was obtained from the hub of the balloon guide catheter just proximal to the right common carotid artery bifurcation. Arteriogram was then performed centered extra cranially and intracranially. FINDINGS: The right common carotid arteriogram demonstrates the right external carotid artery and its major branches to be widely patent. The right internal carotid artery at the bulb demonstrates a smooth shallow plaque. Moderate tortuosity was noted of the proximal right internal carotid artery. 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 Distal to this the cervical petrous junction demonstrates normal opacification. Distal to this the petrous, cavernous, and the cavernous segments demonstrate patency as does the supraclinoid segment. Opacification is seen of the right posterior communicating artery opacifying the right posterior cerebral distribution. The right middle cerebral artery demonstrates complete angiographic occlusion at the origin of the anterior temporal branch. Focal areas of caliber irregularity are seen of the proximal right middle cerebral artery. PROCEDURE: Through the 087 balloon guide catheter in the proximal right internal carotid artery, a Zoom 071 135 cm catheter inside of which was an 021 160 cm Phenom microcatheter was advanced over a 0.014 inch standard  Synchro micro guidewire gingerly through the right internal carotid artery in the neck and into the supraclinoid right ICA. The micro guidewire was then gently manipulated with a torque device and advanced into the inferior division M2 M3 region of the right middle cerebral artery followed by the microcatheter. The guidewire was removed. Good aspiration was obtained from the hub of the microcatheter. Gentle contrast injection demonstrated safe position of tip of the microcatheter. A 5 mm x 37 mm Embotrap retrieval device was then advanced to the distal end of the microcatheter. The O ring on the delivery micro guidewire was loosened. With slight forward gentle traction with the right hand on the delivery micro guidewire, with the left hand the retrieval device was deployed. The 071 Zoom catheter was then advanced into the right middle cerebral artery engaging the occluded right middle cerebral artery. With constant flow arrest in the right internal carotid artery, and constant aspiration using a Penumbra device at the hub of the 071 Zoom catheter for approximately 3 minutes, the combination of the retrieval device, the microcatheter, and the Zoom aspiration catheter were retrieved and removed. Following reversal of flow arrest, a control arteriogram performed through the balloon guide catheter in the right internal carotid artery demonstrates revascularization of the right middle cerebral artery and also of the superior and inferior divisions. The anterior temporal branch remains patent. A TICI 2B revascularization was achieved. This also unmasked an irregular filling defect at the right middle cerebral artery terminus at the trifurcation region with flow noted in the distal inferior division. A second pass was then made again with the combination of the 071 Zoom catheter advanced over an 021 160 cm Phenom microcatheter over a 0.014 inch standard Synchro micro guidewire. Again access was obtained into the distal M2  M3 region of the inferior division with the micro guidewire then followed by the microcatheter. The guidewire was removed. Again safe position of the tip of the microcatheter was confirmed and connected to continuous heparinized saline infusion. A 4 mm  x 40 mm Solitaire X retrieval device was then deployed in the manner described above. Again with proximal flow arrest in the right internal carotid artery proximally, and constant aspiration via the Zoom catheter which was now imbedded in the occluded right middle cerebral artery at its trifurcation region over approximately 2-1/2 minutes, the combination of the retrieval device, the microcatheter, and the Zoom catheter was retrieved and removed. Following reversal of flow arrest, a control arteriogram performed through the right internal carotid artery demonstrated improved opacification of the right MCA trifurcation branches. There continued be occluded superior division. The combination of the microcatheter, inside a 071 Zoom aspiration catheter was again advanced to the right middle cerebral artery over a 0.014 inch standard Synchro micro guidewire, which was now advanced into the superior division of the right middle cerebral artery emanating at the MCA trifurcation region. The microcatheter was advanced to the M2 region over a micro guidewire. The micro guidewire was removed. Good aspiration obtained from the hub of the microcatheter. Again with flow arrest in the right internal carotid artery proximally, and constant aspiration with the Penumbra aspiration device at the hub of the aspiration catheter following deployment of a 3 mm x 20 mm Solitaire X retrieval device, for 2 minutes, the combination of the retrieval device, the microcatheter and the Zoom catheter were retrieved and removed. The superior division continued to be occluded. The superior division was again cannulated with a microcatheter over a micro guidewire as described above. After having  verified safe position of the tip of the microcatheter, in the superior division M2 region, a 4 mm x 40 mm Solitaire X retrieval device was deployed in the usual manner. Again with proximal flow arrest in the proximal right ICA, and constant aspiration at the hub of the Zoom catheter at the site of the occluded superior division in the distal right middle cerebral M1 segment over 2 minutes, the combination of the retrieval device, the microcatheter and the retrieval device were retrieved and removed. Following reverse of flow arrest, there appeared to be modest improved flow in the proximal superior division. It was now noted the inferior division had a near occlusive clot in the proximal aspect. A fourth pass was then made this time using a combination of the Phenom microcatheter, inside of a 6 [place] Catalyst 135 cm catheter advanced over a 0.014 inch standard Synchro micro guidewire to the distal right M1 segment. The micro guidewire was then advanced into the M2 M3 region of the inferior division followed by the microcatheter. The guidewire was removed. A 4 mm x 40 mm Solitaire X retrieval device was deployed with the Catalyst guide catheter advanced at the origin of inferior division and distal to this. Following proximal flow arrest and constant aspiration for approximately 2 minutes at the hub of the Catalyst guide catheter, the combination of the retrieval device, the microcatheter and the Catalyst catheter were retrieved and removed following reversal of flow arrest. Improved flow was now noted into the inferior division proximally through the two main branch points. Following each thrombectomy, strips of fragments were seen intertwined either in the retrieval device, or in the aspiration catheters. 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 It was therefore decided to proceed with placement of a rescue stent in order to prevent  occlusion of the major right MCA inferior division branch. The microcatheter was again advanced over a 0.014 inch standard Synchro micro guidewire with a 6 [place]  Catalyst guide catheter. The microcatheter was advanced over a micro guidewire without difficulty into the M2 region followed by the removal of the micro guidewire. Again good aspiration obtained from the hub of the microcatheter. This was then connected to continuous heparinized saline infusion. Measurements performed of the right middle cerebral artery in the mid M1 segment, and also the proximal inferior division distal to the severe stenosis. A 4 mm x 24 mm Neuroform Atlas stent was advanced to the distal end of the microcatheter. The O ring on the delivery microcatheter was then loosened. With slight forward gentle traction with the right hand on the delivery micro guidewire with left hand the delivery microcatheter was gently retrieved unsheathing the distal and then the proximal portion of the stent with excellent coverage at the site of the severe stenosis. The delivery apparatus was removed. A control arteriogram performed through the balloon guide in the distal cervical ICA on the right demonstrated now significantly improved caliber and flow through the right middle cerebral artery the dominant inferior division. There was poor stagnant flow noted in the proximal superior division. Free flow through the stented segment and also the other branch of the right middle cerebral artery except for the superior division. 8 mg of Integrilin was given intra-arterially in order to prevent intra stent platelet aggregation. A 10 minute post stent arteriogram continued to demonstrate flow through the stented segment in the right MCA distribution except for the non dominant superior division. A TICI 2b revascularization was maintained. The right posterior communicating artery with the right posterior cerebral artery distribution remained unchanged. Throughout  the procedure, the patient's blood pressure and neurological status remained stable. A CT of the brain performed following the third pass demonstrated no mass-effect or midline shift with mild element of contrast in the right perisylvian region. The balloon guide was then retrieved and removed. The 8 French 25 cm Pinnacle sheath was then exchanged over a 0.035 inch J-tip guidewire for a short 8 [place] Pinnacle sheath. An arteriogram performed through this demonstrated spasm in the previously noted tortuous right common iliac artery. There was small amount of contrast noted in the soft tissue at the site of entry of the 8 [place] Pinnacle sheath which completely disappeared on repeat arteriogram performed approximately 3 minutes later. The 8 French sheath was removed and a 7 [place] ExoSeal closure device was placed. Also minimal compression was held for approximately 25 minutes. The distal pulses remained Dopplerable in the dorsalis pedis, and the posterior tibial regions bilaterally. Patient was left intubated on account of the patient's inability to communicate due to difficulty hearing and also her neurological condition. During this time, while pressure was held, and firmness was felt in the region of the pannus on the right side in the right lower quadrant, and also in the right inguinal region. In view of the unstable blood pressure, the patient was sent to CT scan for CT of the pelvis and also of the abdomen and also CT of the brain. CT of the brain revealed no evidence of mass effect or midline shift with no significant change in the right perisylvian hyperattenuation felt to be due to a contrast stain. CT of the abdomen and pelvis revealed no evidence of the overlying skin tightness. Hemodynamically the patient was now stable with blood pressure in the 150s over 80s and the heart rate being regular in the 80s to 90s sinus rhythm. Patient was then transferred to the neuro ICU intubated for post thrombectomy  management. IMPRESSION:  Status post endovascular revascularization of occluded right middle cerebral M1 segment with 4 passes with different stent retrievers and Penumbra aspiration achieving a TICI 2B revascularization. Status post rescue stent placement from the distal M1 segment into the proximal dominant inferior division of the right middle cerebral artery due to underlying severe intracranial arteriosclerosis. PLAN: Follow-up in the clinic 4 weeks post discharge. Electronically Signed   By: Luanne Bras M.D.   On: 12/30/2019 17:15   DG CHEST PORT 1 VIEW  Result Date: 12/31/2019 CLINICAL DATA:  Status post extubation EXAM: PORTABLE CHEST 1 VIEW COMPARISON:  12/30/2019 FINDINGS: Cardiac shadow remains enlarged. Aortic calcifications are again seen. Feeding catheter is noted extending into the stomach. Endotracheal tube has been removed in the interval. The lungs are well aerated without focal infiltrate or sizable effusion. IMPRESSION: Status post extubation. No acute abnormality noted. Electronically Signed   By: Inez Catalina M.D.   On: 12/31/2019 11:27   DG Chest Port 1 View  Result Date: 12/30/2019 CLINICAL DATA:  Intubation. EXAM: PORTABLE CHEST 1 VIEW COMPARISON:  Radiograph 11/08/2019. FINDINGS: Low positioning of the endotracheal tube 6 mm from the carina. Enteric tube in place with tip below the diaphragm not included in this chest field of view. Stable cardiomegaly. Unchanged mediastinal contours. Interstitial coarsening which appears chronic. No focal airspace disease, pleural effusion, or pneumothorax. No acute osseous abnormalities are seen. IMPRESSION: 1. Low positioning of the endotracheal tube 6 mm from the carina. Retraction of 2-3 cm recommended. 2. Enteric tube in place with tip below the diaphragm. 3. Stable cardiomegaly and chronic interstitial coarsening. Electronically Signed   By: Keith Rake M.D.   On: 12/30/2019 01:11   DG Abd Portable 1V  Result Date:  12/30/2019 CLINICAL DATA:  OG tube placement. EXAM: PORTABLE ABDOMEN - 1 VIEW COMPARISON:  Abdomen pelvis CT yesterday. FINDINGS: Tip and side port of the enteric tube below the diaphragm in the stomach. Excreted IV contrast within both renal collecting systems and urinary bladder. Bilateral ureteral stents in place. Nephrograms demonstrate mild hydronephrosis. Nonobstructive bowel gas pattern. IMPRESSION: 1. Tip and side port of the enteric tube below the diaphragm in the stomach. 2. Bilateral ureteral stents in place. Excreted IV contrast in both renal collecting systems in the urinary bladder with mild bilateral hydronephrosis. Electronically Signed   By: Keith Rake M.D.   On: 12/30/2019 01:13   VAS [place] GROIN PSEUDOANEURYSM  Result Date: 12/31/2019  ARTERIAL PSEUDOANEURYSM  Exam: Right groin Indications: Patient complains of bruising. Comparison Study: no prior Performing Technologist: Abram Sander RVS  Examination Guidelines: A complete evaluation includes B-mode imaging, spectral Doppler, color Doppler, and power Doppler as needed of all accessible portions of each vessel. Bilateral testing is considered an integral part of a complete examination. Limited examinations for reoccurring indications may be performed as noted. +------------+----------+--------+------+----------+ Right DuplexPSV (cm/s)WaveformPlaqueComment(s) +------------+----------+--------+------+----------+ CFA            147    biphasic                 +------------+----------+--------+------+----------+ Prox SFA       146    biphasic                 +------------+----------+--------+------+----------+  Summary: No evidence of pseudoaneurysm, AVF or DVT  Diagnosing physician: Deitra Mayo MD Electronically signed by Deitra Mayo MD on 12/31/2019 at 8:54:10 AM.   --------------------------------------------------------------------------------    Final    IR PERCUTANEOUS ART THROMBECTOMY/INFUSION  INTRACRANIAL INC DIAG ANGIO  Result  Date: 12/31/2019 INDICATION: New onset left-sided hemiplegia, right gaze deviation. Occluded right middle cerebral M1 segment on CT angiogram of the head and neck. EXAM: 1. EMERGENT LARGE VESSEL OCCLUSION THROMBOLYSIS (anterior CIRCULATION) COMPARISON:  CT angiogram of the head and neck of June, 27, 2021. MEDICATIONS: Vancomycin 1 g IV was administered within 1 hour of the procedure. ANESTHESIA/SEDATION: General anesthesia CONTRAST:  Isovue 300 approximately 170 mL FLUOROSCOPY TIME:  Fluoroscopy Time: 131 minutes 12 seconds (4835 mGy). COMPLICATIONS: None immediate. TECHNIQUE: Following a full explanation of the procedure along with the potential associated complications, an informed witnessed consent was obtained the patient's son. The risks of intracranial hemorrhage of 10%, worsening neurological deficit, ventilator dependency, death and inability to revascularize were all reviewed in detail with the patient's son. The patient was then put under general anesthesia by the Department of Anesthesiology at [facility]. The right groin was prepped and draped in the usual sterile fashion. Thereafter using modified Seldinger technique, transfemoral access into the right common femoral artery was obtained without difficulty. Over a 0.035 inch guidewire an 8 [place] 25 cm Pinnacle sheath was inserted. Through this, and also over a 0.035 inch guidewire a combination of a select 5 French 125 cm Simmons 2 catheter inside of a 95 cm 087 balloon guide catheter was advanced without difficulty to the right common carotid artery. The guidewire and the select catheter were removed. Good aspiration was obtained from the hub of the balloon guide catheter just proximal to the right common carotid artery bifurcation. Arteriogram was then performed centered extra cranially and intracranially. FINDINGS: The right common carotid arteriogram demonstrates the right external carotid artery and  its major branches to be widely patent. The right internal carotid artery at the bulb demonstrates a smooth shallow plaque. Moderate tortuosity was noted of the proximal right internal carotid artery. 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 Distal to this the cervical petrous junction demonstrates normal opacification. Distal to this the petrous, cavernous, and the cavernous segments demonstrate patency as does the supraclinoid segment. Opacification is seen of the right posterior communicating artery opacifying the right posterior cerebral distribution. The right middle cerebral artery demonstrates complete angiographic occlusion at the origin of the anterior temporal branch. Focal areas of caliber irregularity are seen of the proximal right middle cerebral artery. PROCEDURE: Through the 087 balloon guide catheter in the proximal right internal carotid artery, a Zoom 071 135 cm catheter inside of which was an 021 160 cm Phenom microcatheter was advanced over a 0.014 inch standard Synchro micro guidewire gingerly through the right internal carotid artery in the neck and into the supraclinoid right ICA. The micro guidewire was then gently manipulated with a torque device and advanced into the inferior division M2 M3 region of the right middle cerebral artery followed by the microcatheter. The guidewire was removed. Good aspiration was obtained from the hub of the microcatheter. Gentle contrast injection demonstrated safe position of tip of the microcatheter. A 5 mm x 37 mm Embotrap retrieval device was then advanced to the distal end of the microcatheter. The O ring on the delivery micro guidewire was loosened. With slight forward gentle traction with the right hand on the delivery micro  guidewire, with the left hand the retrieval device was deployed. The 071 Zoom catheter was then advanced into the right  middle cerebral artery engaging the occluded right middle cerebral artery. With constant flow arrest in the right internal carotid artery, and constant aspiration using a Penumbra device at the hub of the 071 Zoom catheter for approximately 3 minutes, the combination of the retrieval device, the microcatheter, and the Zoom aspiration catheter were retrieved and removed. Following reversal of flow arrest, a control arteriogram performed through the balloon guide catheter in the right internal carotid artery demonstrates revascularization of the right middle cerebral artery and also of the superior and inferior divisions. The anterior temporal branch remains patent. A TICI 2B revascularization was achieved. This also unmasked an irregular filling defect at the right middle cerebral artery terminus at the trifurcation region with flow noted in the distal inferior division. A second pass was then made again with the combination of the 071 Zoom catheter advanced over an 021 160 cm Phenom microcatheter over a 0.014 inch standard Synchro micro guidewire. Again access was obtained into the distal M2 M3 region of the inferior division with the micro guidewire then followed by the microcatheter. The guidewire was removed. Again safe position of the tip of the microcatheter was confirmed and connected to continuous heparinized saline infusion. A 4 mm x 40 mm Solitaire X retrieval device was then deployed in the manner described above. Again with proximal flow arrest in the right internal carotid artery proximally, and constant aspiration via the Zoom catheter which was now imbedded in the occluded right middle cerebral artery at its trifurcation region over approximately 2-1/2 minutes, the combination of the retrieval device, the microcatheter, and the Zoom catheter was retrieved and removed. Following  reversal of flow arrest, a control arteriogram performed through the right internal carotid artery demonstrated improved opacification of the right MCA trifurcation branches. There continued be occluded superior division. The combination of the microcatheter, inside a 071 Zoom aspiration catheter was again advanced to the right middle cerebral artery over a 0.014 inch standard Synchro micro guidewire, which was now advanced into the superior division of the right middle cerebral artery emanating at the MCA trifurcation region. The microcatheter was advanced to the M2 region over a micro guidewire. The micro guidewire was removed. Good aspiration obtained from the hub of the microcatheter. Again with flow arrest in the right internal carotid artery proximally, and constant aspiration with the Penumbra aspiration device at the hub of the aspiration catheter following deployment of a 3 mm x 20 mm Solitaire X retrieval device, for 2 minutes, the combination of the retrieval device, the microcatheter and the Zoom catheter were retrieved and removed. The superior division continued to be occluded. The superior division was again cannulated with a microcatheter over a micro guidewire as described above. After having verified safe position of the tip of the microcatheter, in the superior division M2 region, a 4 mm x 40 mm Solitaire X retrieval device was deployed in the usual manner. Again with proximal flow arrest in the proximal right ICA, and constant aspiration at the hub of the Zoom catheter at the site of the occluded superior division in the distal right middle cerebral M1 segment over 2 minutes, the combination of the retrieval device, the microcatheter and the retrieval device were retrieved and removed. Following reverse of flow arrest, there appeared to be modest improved flow in the proximal superior division. It was now noted the inferior division had a near occlusive clot in the proximal aspect. A fourth pass  was then made this time using a combination of the Phenom  microcatheter, inside of a 6 [place] Catalyst 135 cm catheter advanced over a 0.014 inch standard Synchro micro guidewire to the distal right M1 segment. The micro guidewire was then advanced into the M2 M3 region of the inferior division followed by the microcatheter. The guidewire was removed. A 4 mm x 40 mm Solitaire X retrieval device was deployed with the Catalyst guide catheter advanced at the origin of inferior division and distal to this. Following proximal flow arrest and constant aspiration for approximately 2 minutes at the hub of the Catalyst guide catheter, the combination of the retrieval device, the microcatheter and the Catalyst catheter were retrieved and removed following reversal of flow arrest. Improved flow was now noted into the inferior division proximally through the two main branch points. Following each thrombectomy, strips of fragments were seen intertwined either in the retrieval device, or in the aspiration catheters. 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 It was therefore decided to proceed with placement of a rescue stent in order to prevent occlusion of the major right MCA inferior division branch. The microcatheter was again advanced over a 0.014 inch standard Synchro micro guidewire with a 6 [place] Catalyst guide catheter. The microcatheter was advanced over a micro guidewire without difficulty into the M2 region followed by the removal of the micro guidewire. Again good aspiration obtained from the hub of the microcatheter. This was then connected to continuous heparinized saline infusion. Measurements performed of the right middle cerebral artery in the mid M1 segment, and also the proximal inferior division distal to the severe stenosis. A 4 mm x 24 mm Neuroform Atlas stent was advanced to the distal end of the microcatheter. The O ring on the  delivery microcatheter was then loosened. With slight forward gentle traction with the right hand on the delivery micro guidewire with left hand the delivery microcatheter was gently retrieved unsheathing the distal and then the proximal portion of the stent with excellent coverage at the site of the severe stenosis. The delivery apparatus was removed. A control arteriogram performed through the balloon guide in the distal cervical ICA on the right demonstrated now significantly improved caliber and flow through the right middle cerebral artery the dominant inferior division. There was poor stagnant flow noted in the proximal superior division. Free flow through the stented segment and also the other branch of the right middle cerebral artery except for the superior division. 8 mg of Integrilin was given intra-arterially in order to prevent intra stent platelet aggregation. A 10 minute post stent arteriogram continued to demonstrate flow through the stented segment in the right MCA distribution except for the non dominant superior division. A TICI 2b revascularization was maintained. The right posterior communicating artery with the right posterior cerebral artery distribution remained unchanged. Throughout the procedure, the patient's blood pressure and neurological status remained stable. A CT of the brain performed following the third pass demonstrated no mass-effect or midline shift with mild element of contrast in the right perisylvian region. The balloon guide was then retrieved and removed. The 8 French 25 cm Pinnacle sheath was then exchanged over a 0.035 inch J-tip guidewire for a short 8 [place] Pinnacle sheath. An arteriogram performed through this demonstrated spasm in the previously noted tortuous right common iliac artery. There was small amount of contrast noted in the soft tissue at the site of entry of the 8 [place] Pinnacle sheath which completely disappeared on repeat arteriogram performed  approximately 3 minutes  later. The 8 French sheath was removed and a 7 [place] ExoSeal closure device was placed. Also minimal compression was held for approximately 25 minutes. The distal pulses remained Dopplerable in the dorsalis pedis, and the posterior tibial regions bilaterally. Patient was left intubated on account of the patient's inability to communicate due to difficulty hearing and also her neurological condition. During this time, while pressure was held, and firmness was felt in the region of the pannus on the right side in the right lower quadrant, and also in the right inguinal region. In view of the unstable blood pressure, the patient was sent to CT scan for CT of the pelvis and also of the abdomen and also CT of the brain. CT of the brain revealed no evidence of mass effect or midline shift with no significant change in the right perisylvian hyperattenuation felt to be due to a contrast stain. CT of the abdomen and pelvis revealed no evidence of the overlying skin tightness. Hemodynamically the patient was now stable with blood pressure in the 150s over 80s and the heart rate being regular in the 80s to 90s sinus rhythm. Patient was then transferred to the neuro ICU intubated for post thrombectomy management. IMPRESSION: Status post endovascular revascularization of occluded right middle cerebral M1 segment with 4 passes with different stent retrievers and Penumbra aspiration achieving a TICI 2B revascularization. Status post rescue stent placement from the distal M1 segment into the proximal dominant inferior division of the right middle cerebral artery due to underlying severe intracranial arteriosclerosis. PLAN: Follow-up in the clinic 4 weeks post discharge. Electronically Signed   By: Luanne Bras M.D.   On: 12/30/2019 17:15   CT HEAD CODE STROKE WO CONTRAST  Result Date: 12/29/2019 CLINICAL DATA:  Code stroke.  Left-sided weakness EXAM: CT HEAD WITHOUT CONTRAST TECHNIQUE: Contiguous  axial images were obtained from the base of the skull through the vertex without intravenous contrast. COMPARISON:  None. FINDINGS: Brain: There is no acute intracranial hemorrhage or mass effect. Gray-white differentiation is preserved. Ventricles and sulci are within normal limits in size and configuration. Patchy hypoattenuation in the supratentorial white matter is nonspecific but probably reflects chronic microvascular ischemic changes. Extra-axial dural-based hyperdense lesion along the high left frontal convexity and falx measuring 2.6 x 1.8 x 2.3 cm is consistent with a meningioma. There is no apparent underlying edema. This abuts the superior sagittal sinus. Vascular: No hyperdense vessel. Mild intracranial atherosclerotic calcification at the skull base. Skull: Unremarkable. Sinuses/Orbits: No acute abnormality. Other: Mastoid air cells are clear. ASPECTS ([place] Stroke Program Early CT Score) - Ganglionic level infarction (caudate, lentiform nuclei, internal capsule, insula, M1-M3 cortex): 7 - Supraganglionic infarction (M4-M6 cortex): 3 Total score (0-10 with 10 being normal): 10 IMPRESSION: No acute intracranial hemorrhage or evidence of acute infarction. ASPECT score is 10. Chronic microvascular ischemic changes. Left frontal meningioma without significant mass effect or underlying edema. Initial results were communicated to Dr. Lorraine Lax at [place] 6/27/2021by text page via the [facility] messaging system. Electronically Signed   By: Macy Mis M.D.   On: 12/29/2019 18:20   ECHOCARDIOGRAM LIMITED  Result Date: 12/30/2019    ECHOCARDIOGRAM LIMITED REPORT   Patient Name:   Gloria [name] Date of Exam: 12/30/2019 Medical Rec #:  [id number]           Height:       62.0 in Accession #:    [id number]          Weight:  211.0 lb Date of Birth:  [birth year]            BSA:          1.956 m Patient Age:    74 years            BP:           132/80 mmHg Patient Gender: F                   HR:           64  bpm. Exam Location:  Inpatient Procedure: Limited Color Doppler, Cardiac Doppler and Limited Echo Indications:    stroke 434.91  History:        Patient has prior history of Echocardiogram examinations, most                 recent 11/09/2019. Arrythmias:Atrial Fibrillation.  Sonographer:    Johny Chess Referring Phys: [id number] [place]  1. Left ventricular ejection fraction, by estimation, is >75%. The left ventricle has hyperdynamic function. The left ventricle has no regional wall motion abnormalities. There is severe left ventricular hypertrophy. Left ventricular diastolic parameters are indeterminate.  2. Right ventricular systolic function is normal. The right ventricular size is normal. There is mildly elevated pulmonary artery systolic pressure.  3. Tricuspid valve regurgitation is mild to moderate.  4. The aortic valve is abnormal. Aortic valve regurgitation is mild to moderate. Mild aortic valve sclerosis is present, with no evidence of aortic valve stenosis.  5. The inferior vena cava is normal in size with greater than 50% respiratory variability, suggesting right atrial pressure of 3 mmHg. FINDINGS  Left Ventricle: Left ventricular ejection fraction, by estimation, is >75%. The left ventricle has hyperdynamic function. The left ventricle has no regional wall motion abnormalities. The left ventricular internal cavity size was small. There is severe left ventricular hypertrophy. Right Ventricle: The right ventricular size is normal. No increase in right ventricular wall thickness. Right ventricular systolic function is normal. There is mildly elevated pulmonary artery systolic pressure. The tricuspid regurgitant velocity is 3.14  m/s, and with an assumed right atrial pressure of 3 mmHg, the estimated right ventricular systolic pressure is 13.2 mmHg. Pericardium: Trivial pericardial effusion is present. Mitral Valve: The mitral valve is abnormal. There is mild thickening of the  mitral valve leaflet(s). Moderate mitral annular calcification. Tricuspid Valve: The tricuspid valve is grossly normal. Tricuspid valve regurgitation is mild to moderate. Aortic Valve: The aortic valve is abnormal. Aortic valve regurgitation is mild to moderate. Mild aortic valve sclerosis is present, with no evidence of aortic valve stenosis. Pulmonic Valve: The pulmonic valve was normal in structure. Pulmonic valve regurgitation is not visualized. Aorta: The aortic root and ascending aorta are structurally normal, with no evidence of dilitation. Venous: The inferior vena cava is normal in size with greater than 50% respiratory variability, suggesting right atrial pressure of 3 mmHg.  LEFT VENTRICLE PLAX 2D LVIDd:         3.10 cm LVIDs:         1.60 cm LV PW:         1.70 cm LV IVS:        1.60 cm LVOT diam:     1.90 cm LVOT Area:     2.84 cm  IVC IVC diam: 1.90 cm LEFT ATRIUM         Index LA diam:    4.70 cm 2.40 cm/m   AORTA Ao Root diam:  3.50 cm Ao Asc diam:  3.50 cm TRICUSPID VALVE TR Peak grad:   39.4 mmHg TR Vmax:        314.00 cm/s  SHUNTS Systemic Diam: 1.90 cm Dorris Carnes MD Electronically signed by Dorris Carnes MD Signature Date/Time: 12/30/2019/7:38:12 PM    Final     Labs:  CBC: Recent Labs    12/29/19 1802 12/29/19 1846 12/30/19 0134 12/30/19 0500 12/31/19 0342 01/01/20 0615  WBC 5.8  --   --  7.8 10.4 6.0  HGB 11.8*   < > 8.8* 8.4* 7.8* 7.3*  HCT 38.3   < > 26.0* 26.6* 24.7* 23.5*  PLT 225  --   --  200 223 157   < > = values in this interval not displayed.    COAGS: Recent Labs    12/29/19 1802  INR 1.1  APTT 25    BMP: Recent Labs    12/29/19 1802 12/29/19 1846 12/29/19 2300 12/29/19 2300 12/30/19 0134 12/30/19 0500 12/31/19 0342 01/01/20 0615  NA 141   < > 139   < > 141 141 141 142  K 5.3*   < > 4.4   < > 4.3 4.5 4.7 4.3  CL 106   < > 108  --   --  113* 112* 112*  CO2 24  --   --   --   --  22 19* 22  GLUCOSE 109*   < > 134*  --   --  149* 103* 125*  BUN  32*   < > 26*  --   --  28* 33* 32*  CALCIUM 8.9  --   --   --   --  7.9* 8.0* 8.1*  CREATININE 1.76*   < > 1.50*  --   --  1.44* 1.88* 1.64*  GFRNONAA 28*  --   --   --   --  36* 26* 30*  GFRAA 32*  --   --   --   --  41* 30* 35*   < > = values in this interval not displayed.    LIVER FUNCTION TESTS: Recent Labs    11/08/19 1722 11/08/19 1722 11/09/19 0431 11/10/19 0455 11/11/19 0442 12/29/19 1802  BILITOT 0.6  --   --  0.3 0.3 0.6  AST 14*  --   --  13* 13* 17  ALT 14  --   --  13 14 11   ALKPHOS 66  --   --  57 54 82  PROT 6.9  --   --  6.1* 6.2* 6.2*  ALBUMIN 3.3*   < > 3.0* 2.7* 2.8* 3.2*   < > = values in this interval not displayed.    Assessment and Plan:  History of acute CVA s/p cerebral arteriogram with emergent mechanical thrombectomy of right MCA M1 occlusion achieving a TICI 2b revascularization, along with revascularization of right MCA stenosis using rescue stent placement via right femoral approach 12/29/2019 by Dr. Estanislado Pandy. Patient's condition stable- lethargic but opens eyes to gentle touch (deaf) but quickly falls back asleep and does not follow simple commands, can spontaneously move right side, left side withdraws from pain. Right groin puncture site with tender palpable hematoma- will order vascular US for evaluation today. Continue taking Brilinta 90 mg twice daily and Aspirin 81 mg once daily. Further plans per neurology/CCM- appreciate and agree with management. NIR to follow.  ADDENDUM: Preliminary vascular US right groin reviewed by myself and Dr. Estanislado Pandy- no evidence of pseudoaneurysm or  hematoma. Plan to follow-up with CTA head/neck in 3 months- message sent to schedulers to facilitate this. Please call NIR with questions/concerns.   Electronically Signed: Earley Abide, PA-C 01/01/2020, 10:23 AM   I spent a total of 25 Minutes at the the patient's bedside AND on the patient's hospital floor or unit, greater than 50% of which was  counseling/coordinating care for CVA s/p revascularization.

## 2020-01-01 NOTE — Progress Notes (Signed)
Unfortunate 74 year old female who is undergoing several recent ureteroscopy's for a large burden of bilateral obstructing kidney stones who was admitted with a large stroke.   Interval: The patient has subsequently been extubated.  Her urine has started to clear.  She is voiding with a wick catheter.  Scheduled Meds: .  stroke: mapping our early stages of recovery book   Does not apply Once  . aspirin  81 mg Oral Daily   Or  . aspirin  81 mg Per Tube Daily  . atorvastatin  40 mg Per Tube q1800  . chlorhexidine  15 mL Mouth Rinse BID  . Chlorhexidine Gluconate Cloth  6 each Topical Daily  . docusate  100 mg Per Tube BID  . feeding supplement (PRO-STAT SUGAR FREE 64)  30 mL Per Tube BID  . mouth rinse  15 mL Mouth Rinse q12n4p  . pantoprazole sodium  40 mg Per Tube Daily  . polyethylene glycol  17 g Per Tube Daily  . sodium chloride flush  3 mL Intravenous Once  . ticagrelor  90 mg Oral BID   Or  . ticagrelor  90 mg Per Tube BID   Continuous Infusions: . sodium chloride Stopped (12/30/19 0030)  . sodium chloride    . ciprofloxacin    . clevidipine    . feeding supplement (JEVITY 1.2 CAL) 1,000 mL (12/31/19 1506)  . phenylephrine (NEO-SYNEPHRINE) Adult infusion Stopped (12/31/19 0919)  . propofol (DIPRIVAN) infusion Stopped (12/30/19 1057)   PRN Meds:.acetaminophen **OR** acetaminophen (TYLENOL) oral liquid 160 mg/5 mL **OR** acetaminophen, fentaNYL (SUBLIMAZE) injection, fentaNYL (SUBLIMAZE) injection, senna-docusate  PE: Vitals:   01/01/20 0300 01/01/20 0400 01/01/20 0500 01/01/20 0600  BP: (!) 129/58 (!) 138/58 (!) 158/73 (!) 125/49  Pulse: 66 60 78 63  Resp: (!) 26 (!) 22 (!) 29 17  Temp:  98.2 F (36.8 C)    TempSrc:  Axillary    SpO2: 97% 96% 98% 100%  Weight:   96.6 kg     Intake/Output Summary (Last 24 hours) at 01/01/2020 0640 Last data filed at 01/01/2020 0600 Gross per 24 hour  Intake 1872.98 ml  Output 1700 ml  Net 172.98 ml   Arousable, gaze to the far  right, nonresponsive really to any real stimulants Appears to be in no acute distress Patient is on room air, breathing comfortably Her abdomen is soft Her stent strings are easily accessible from the urethra She has a hematoma on the right hip, but her lower extremities are otherwise symmetric  Recent Labs    12/29/19 1802 12/29/19 1846 12/30/19 0134 12/30/19 0500 12/31/19 0342  WBC 5.8  --   --  7.8 10.4  HGB 11.8*   < > 8.8* 8.4* 7.8*  HCT 38.3   < > 26.0* 26.6* 24.7*   < > = values in this interval not displayed.   Recent Labs    12/29/19 1802 12/29/19 1846 12/29/19 2300 12/29/19 2300 12/30/19 0134 12/30/19 0500 12/31/19 0342  NA 141   < > 139   < > 141 141 141  K 5.3*   < > 4.4   < > 4.3 4.5 4.7  CL 106   < > 108  --   --  113* 112*  CO2 24  --   --   --   --  22 19*  GLUCOSE 109*   < > 134*  --   --  149* 103*  BUN 32*   < > 26*  --   --  28* 33*  CREATININE 1.76*   < > 1.50*  --   --  1.44* 1.88*  CALCIUM 8.9  --   --   --   --  7.9* 8.0*   < > = values in this interval not displayed.   Recent Labs    12/29/19 1802  INR 1.1   No results for input(s): PSA in the last 72 hours. No results for input(s): LABURIN in the last 72 hours. Results for orders placed or performed during the hospital encounter of 12/29/19  SARS Coronavirus 2 by RT PCR (hospital order, performed in [facility] hospital lab) Nasopharyngeal Nasopharyngeal Swab     Status: None   Collection Time: 12/29/19  7:02 PM   Specimen: Nasopharyngeal Swab  Result Value Ref Range Status   SARS Coronavirus 2 NEGATIVE NEGATIVE Final    Comment: (NOTE) SARS-CoV-2 target nucleic acids are NOT DETECTED.  The SARS-CoV-2 RNA is generally detectable in upper and lower respiratory specimens during the acute phase of infection. The lowest concentration of SARS-CoV-2 viral copies this assay can detect is 250 copies / mL. A negative result does not preclude SARS-CoV-2 infection and should not be used as the  sole basis for treatment or other patient management decisions.  A negative result may occur with improper specimen collection / handling, submission of specimen other than nasopharyngeal swab, presence of viral mutation(s) within the areas targeted by this assay, and inadequate number of viral copies (<250 copies / mL). A negative result must be combined with clinical observations, patient history, and epidemiological information.  Fact Sheet for Patients:   StrictlyIdeas.no  Fact Sheet for Healthcare Providers: BankingDealers.co.za  This test is not yet approved or  cleared by the [place] FDA and has been authorized for detection and/or diagnosis of SARS-CoV-2 by FDA under an Emergency Use Authorization (EUA).  This EUA will remain in effect (meaning this test can be used) for the duration of the COVID-19 declaration under Section 564(b)(1) of the Act, 21 U.S.C. section 360bbb-3(b)(1), unless the authorization is terminated or revoked sooner.  Performed at [facility], [place] [address] [place], [place] [address]   MRSA PCR Screening     Status: None   Collection Time: 12/30/19 12:13 AM   Specimen: Nasopharyngeal  Result Value Ref Range Status   MRSA by PCR NEGATIVE NEGATIVE Final    Comment:        The GeneXpert MRSA Assay (FDA approved for NASAL specimens only), is one component of a comprehensive MRSA colonization surveillance program. It is not intended to diagnose MRSA infection nor to guide or monitor treatment for MRSA infections. Performed at [facility], [place] [address] [place], [place] [address]     Imp: Status post bilateral ureteroscopy with stent placement in both ureters.  Stent string was emanating from the patient's urethra in anticipation of removal in clinic today.  Unfortunately the patient was hospitalized with a large CVA.  She initially had gross hematuria, this seems to be clearing  now.  Plan: I was able to easily remove the patient's stents with the help of the nurses.  We did give her a bolus of fentanyl because of her right hip pain and moving associated with positioning for the stent removal.  We opted not to place a Foley given that the wick catheter seem to be doing an adequate job of collecting her urine.  I did give her 1 dose of ciprofloxacin 200 mg IV for prophylaxis and  removing the stents.  The patient needs no additional treatment from our perspective.  I would monitor her creatinine and ensure that it remains stable.  If she has rising creatinine it may be prudent to get an ultrasound to ensure that her kidneys are not hydronephrotic and/or obstructed from stone fragments.

## 2020-01-01 NOTE — Consult Note (Signed)
Physical Medicine and Rehabilitation Consult Reason for Consult: Left side weakness Referring Physician: Dr.Xu   HPI: Gloria [name] is a 74 y.o. right-handed female with history of atrial fibrillation maintained on aspirin, hypertension bilateral ureteral stent for renal stones.  History taken from chart review due to cognition.  Patient lives alone independent with assistive device.  1 level home.  She presented on 12/29/2019 with left hemiparesis and right gaze preference.  MRI brain personally reviewed, showing multiple right brain infarcts.  Per report CT/MRI/MRI, acute right occipital cortex infarct.  Acute infarct of the right insular region and frontal operculum.  Small scattered other acute infarcts in the right frontal and parietal regions.  Findings of right M1 occlusion and underwent right MCA stenting per interventional radiology.  Patient was extubated on 12/31/2019.  Lower extremity Dopplers negative for DVT.  Echocardiogram with ejection fraction of 75% with severe LVH and mild to moderate TVR.  Maintain on aspirin 81 mg daily and Brilinta for CVA prophylaxis.  Subcutaneous heparin for DVT prophylaxis.  Hospital course further complicated by postop dysphagia.  Currently n.p.o. with alternative means of nutritional support.  Therapy evaluations completed with recommendations of physical medicine rehab consult.   Review of Systems  Unable to perform ROS: Medical condition   Past Medical History:  Diagnosis Date   Arthritis    knees   Atrial fibrillation ([place])    Dysrhythmia    History of kidney stones    HOH (hard of hearing)    Hypertension    Past Surgical History:  Procedure Laterality Date   CHOLECYSTECTOMY     CYSTOSCOPY WITH STENT PLACEMENT Bilateral 11/09/2019   Procedure: CYSTOSCOPY BILATERAL URETERAL STENT PLACEMENT;  Surgeon: Ardis Hughs, MD;  Location: [facility];  Service: Urology;  Laterality: Bilateral;   CYSTOSCOPY/URETEROSCOPY/HOLMIUM  LASER/STENT PLACEMENT Right 11/22/2019   Procedure: CYSTOSCOPY RIGHT URETEROSCOPY/HOLMIUM LASER STONE EXTRACTION /STENT EXCHANGE;  Surgeon: Ardis Hughs, MD;  Location: [facility];  Service: Urology;  Laterality: Right;   CYSTOSCOPY/URETEROSCOPY/HOLMIUM LASER/STENT PLACEMENT Left 12/06/2019   Procedure: CYSTOSCOPY BILATERAL URETEROSCOPY/HOLMIUM LASER/STENT EXCHANGE;  Surgeon: Ardis Hughs, MD;  Location: [facility];  Service: Urology;  Laterality: Left;   CYSTOSCOPY/URETEROSCOPY/HOLMIUM LASER/STENT PLACEMENT Bilateral 12/25/2019   Procedure: BILATERAL URETEROSCOPY/HOLMIUM LASER LEFT, BILATERAL STONE EXTRACTION BILATERAL STENT EXCHANGE;  Surgeon: Ardis Hughs, MD;  Location: [facility];  Service: Urology;  Laterality: Bilateral;   IR CT HEAD LTD  12/30/2019   IR INTRA CRAN STENT  12/30/2019   IR PERCUTANEOUS ART THROMBECTOMY/INFUSION INTRACRANIAL INC DIAG ANGIO  12/30/2019   RADIOLOGY WITH ANESTHESIA N/A 12/29/2019   Procedure: IR WITH ANESTHESIA;  Surgeon: Radiologist, Medication, MD;  Location: [facility];  Service: Radiology;  Laterality: N/A;   History reviewed. No pertinent family history.  Unable to obtain from patient. Social History:  reports that she has never smoked. She has never used smokeless tobacco. She reports that she does not drink alcohol and does not use drugs. Allergies:  Allergies  Allergen Reactions   Aspartame And Phenylalanine Other (See Comments)    unknown   Azithromycin Other (See Comments)    Unknown   Bee Venom Other (See Comments)    Hyper   Cortizone-5 [Hydrocortisone] Itching and Swelling   Olmesartan Other (See Comments)    unknown   Other Other (See Comments)    IV dye Dyes - unknown reaction    Penicillins Other (See Comments)    unknown   Valsartan Other (See Comments)  Unknown   Latex Rash and Other (See Comments)    Skin peels   Medications Prior to Admission  Medication Sig Dispense Refill   aspirin 325 MG tablet Take 325 mg  by mouth daily.     carvedilol (COREG) 6.25 MG tablet Take 6.25 mg by mouth 2 (two) times daily.     furosemide (LASIX) 20 MG tablet Take 1 tablet (20 mg total) by mouth as needed for fluid. (Patient taking differently: Take 20 mg by mouth daily as needed for fluid. )  0   magnesium oxide (MAG-OX) 400 MG tablet Take 400 mg by mouth 2 (two) times daily.     traMADol (ULTRAM) 50 MG tablet Take 1-2 tablets (50-100 mg total) by mouth every 6 (six) hours as needed for moderate pain. 15 tablet 0   mirabegron ER (MYRBETRIQ) 25 MG TB24 tablet Take 1 tablet (25 mg total) by mouth daily.      Home: Home Living Family/patient expects to be discharged to:: Private residence Living Arrangements: Alone Available Help at Discharge: Family, Available PRN/intermittently Type of Home: [place]: One level Bathroom Shower/Tub: Other (comment) (does not use) Home Equipment: Cane - quad, Cane - single point, Bedside commode, Walker - 2 wheels Additional Comments: Pt unable to provide info. therefore info gleaned from chart review   Functional History: Prior Function Level of Independence: Independent with assistive device(s) Comments: walks with a cane, sponge bathes only Functional Status:  Mobility: Bed Mobility Overal bed mobility: Needs Assistance General bed mobility comments: total assist from bed to position in fully upright chiar mode.  worked from there on engaging her cognitively finally having some minimal success with large print in far R gaze, attempting to at least get gaze to midline/track, attempting some automatic ADLs, she did at the end of the session take a pen from me and start writing on a piece of paper, but most of it was illegible.   Transfers General transfer comment: unable to attempt safely       ADL: ADL Overall ADL's : Needs assistance/impaired Eating/Feeding: NPO Grooming: Wash/dry hands, Wash/dry face, Oral care, Brushing hair, Total assistance, Sitting,  Bed level Upper Body Bathing: Total assistance, Bed level, Sitting Lower Body Bathing: Total assistance, Bed level Upper Body Dressing : Total assistance, Bed level Lower Body Dressing: Total assistance, Bed level Toilet Transfer: Total assistance Toilet Transfer Details (indicate cue type and reason): unable  Functional mobility during ADLs: Total assistance, +2 for physical assistance General ADL Comments: Pt able to read some commands asking her to perform simple ADL tasks, but she refuses them all.  "I don't have my special toothbrush", "if I comb my hair it will fall out", "I don't want to wash my face  Cognition: Cognition Overall Cognitive Status: Difficult to assess Orientation Level: Oriented to person Cognition Arousal/Alertness: Awake/alert, Lethargic Behavior During Therapy: Flat affect Overall Cognitive Status: Difficult to assess Area of Impairment: Attention, Safety/judgement, Awareness Current Attention Level: Sustained, Focused Safety/Judgement: Decreased awareness of safety, Decreased awareness of deficits Awareness: Intellectual General Comments: Pt severely HOH and demonstrates severe Lt neglect with profound Rt gaze preference.  She was initially very lethargic with minimal eye opening, however, when she was moved into partial chair position in the bed, her arousal improved.  Once she is able to visually locate person, and able to focus on them, she was able to read short bits of info if it was placed on her far right, and she was able to respond semi  appropriately to the written info, however, she didn't follow any commands as she had a reason why not to perform all tasks asked of her.  Difficult to assess due to: Hard of hearing/deaf  Blood pressure (!) 118/56, pulse 62, temperature 98.9 F (37.2 C), temperature source Axillary, resp. rate (!) 23, weight 96.6 kg, SpO2 99 %. Physical Exam Vitals reviewed.  Constitutional:      General: She is not in acute  distress.    Appearance: She is obese.  HENT:     Head: Normocephalic.     Comments: Scattered abrasions + NG    Right Ear: External ear normal.     Left Ear: External ear normal.     Nose: Nose normal.  Eyes:     General:        Right eye: No discharge.        Left eye: No discharge.     Comments: Right gaze preference  Pulmonary:     Effort: Pulmonary effort is normal. No respiratory distress.     Breath sounds: No stridor.  Abdominal:     General: There is no distension.     Palpations: Abdomen is soft.  Musculoskeletal:     Cervical back: Neck supple.     Comments: Left-sided edema  Skin:    Comments: Scattered abrasions and ecchymosis with hematomas  Neurological:     Mental Status: She is alert.     Comments: Alert  Severe left neglect Very hard of hearing.   Motor: Limited due to participation, some spontaneous movement noted on right side, no movement noted on left side Right lean  Psychiatric:     Comments: Unable to assess due to mentation     Results for orders placed or performed during the hospital encounter of 12/29/19 (from the past 24 hour(s))  Glucose, capillary     Status: Abnormal   Collection Time: 12/31/19 11:50 AM  Result Value Ref Range   Glucose-Capillary 102 (H) 70 - 99 mg/dL  Magnesium     Status: None   Collection Time: 12/31/19 12:11 PM  Result Value Ref Range   Magnesium 1.8 1.7 - 2.4 mg/dL  Phosphorus     Status: None   Collection Time: 12/31/19 12:11 PM  Result Value Ref Range   Phosphorus 4.0 2.5 - 4.6 mg/dL  Glucose, capillary     Status: None   Collection Time: 12/31/19  4:12 PM  Result Value Ref Range   Glucose-Capillary 92 70 - 99 mg/dL  Magnesium     Status: None   Collection Time: 12/31/19  4:55 PM  Result Value Ref Range   Magnesium 1.8 1.7 - 2.4 mg/dL  Phosphorus     Status: None   Collection Time: 12/31/19  4:55 PM  Result Value Ref Range   Phosphorus 3.5 2.5 - 4.6 mg/dL  Glucose, capillary     Status: Abnormal    Collection Time: 12/31/19  7:35 PM  Result Value Ref Range   Glucose-Capillary 120 (H) 70 - 99 mg/dL  Glucose, capillary     Status: Abnormal   Collection Time: 12/31/19 11:41 PM  Result Value Ref Range   Glucose-Capillary 119 (H) 70 - 99 mg/dL  Glucose, capillary     Status: Abnormal   Collection Time: 01/01/20  3:39 AM  Result Value Ref Range   Glucose-Capillary 120 (H) 70 - 99 mg/dL  CBC     Status: Abnormal   Collection Time: 01/01/20  6:15 AM  Result Value  Ref Range   WBC 6.0 4.0 - 10.5 K/uL   RBC 2.24 (L) 3.87 - 5.11 MIL/uL   Hemoglobin 7.3 (L) 12.0 - 15.0 g/dL   HCT 23.5 (L) 36 - 46 %   MCV 104.9 (H) 80.0 - 100.0 fL   MCH 32.6 26.0 - 34.0 pg   MCHC 31.1 30.0 - 36.0 g/dL   RDW 14.3 11.5 - 15.5 %   Platelets 157 150 - 400 K/uL   nRBC 0.0 0.0 - 0.2 %  Basic metabolic panel     Status: Abnormal   Collection Time: 01/01/20  6:15 AM  Result Value Ref Range   Sodium 142 135 - 145 mmol/L   Potassium 4.3 3.5 - 5.1 mmol/L   Chloride 112 (H) 98 - 111 mmol/L   CO2 22 22 - 32 mmol/L   Glucose, Bld 125 (H) 70 - 99 mg/dL   BUN 32 (H) 8 - 23 mg/dL   Creatinine, Ser 1.64 (H) 0.44 - 1.00 mg/dL   Calcium 8.1 (L) 8.9 - 10.3 mg/dL   GFR calc non Af Amer 30 (L) >60 mL/min   GFR calc Af Amer 35 (L) >60 mL/min   Anion gap 8 5 - 15  Triglycerides     Status: None   Collection Time: 01/01/20  6:15 AM  Result Value Ref Range   Triglycerides 110 <150 mg/dL  Magnesium     Status: None   Collection Time: 01/01/20  6:15 AM  Result Value Ref Range   Magnesium 1.8 1.7 - 2.4 mg/dL  Phosphorus     Status: None   Collection Time: 01/01/20  6:15 AM  Result Value Ref Range   Phosphorus 3.3 2.5 - 4.6 mg/dL  Glucose, capillary     Status: Abnormal   Collection Time: 01/01/20  8:10 AM  Result Value Ref Range   Glucose-Capillary 120 (H) 70 - 99 mg/dL  Prepare RBC (crossmatch)     Status: None   Collection Time: 01/01/20  8:21 AM  Result Value Ref Range   Order Confirmation      ORDER PROCESSED  BY BLOOD BANK Performed at [facility], [address] [place], [place] [address]    MR ANGIO HEAD WO CONTRAST  Result Date: 12/30/2019 CLINICAL DATA:  Status post right M1 occlusion with intervention EXAM: MRI HEAD WITHOUT CONTRAST MRA HEAD WITHOUT CONTRAST TECHNIQUE: Multiplanar, multiecho pulse sequences of the brain and surrounding structures were obtained without intravenous contrast. Angiographic images of the head were obtained using MRA technique without contrast. COMPARISON:  Multiple CT studies done yesterday. FINDINGS: MRI HEAD FINDINGS Brain: Diffusion imaging shows acute infarction affecting the right occipital cortex. Acute infarction affects the right insular region and frontal operculum. Small foci of acute infarction elsewhere in the right frontal and parietal regions. Areas of infarction show mild swelling but there is no mass effect. Small focus of susceptibility in the right sylvian fissure region may be intravascular. Few small foci of susceptibility in the right parietal region also suspected to be intravascular. No sign of parenchymal hematoma. No hydrocephalus. No extra-axial collection. Mild chronic small-vessel ischemic changes elsewhere throughout the cerebral hemispheric white matter. 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 Vascular: Stent in the right M1 region. Skull and upper cervical spine: Negative Sinuses/Orbits: Clear/normal Other: None MRA HEAD FINDINGS Both internal carotid arteries show antegrade flow through the skull base. On the left, there is supply in the left MCA  territory and both anterior cerebral artery territories. Moderate atherosclerotic irregularity of the MCA branches. On the right, there is signal loss related to the right MCA stent. More distal MCA branch vessels do show supply, with absence of the superior division. Right vertebral artery terminates in PICA. Left  vertebral artery supplies the basilar. 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 Left PCA arises from the basilar tip. Distal vessel atherosclerotic irregularity. Right PCA arises from the right carotid and shows poor supply of the distal branches. 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 Fetal origin right PCA with poor flow in the distal branch vessels. Acute infarction of the right insular region and frontal operculum. Missing superior division right MCA. Small scattered other acute infarctions in the right frontal and parietal regions. Swelling but no mass effect. No intraparenchymal hematoma. Few small foci of petechial blood versus vascular susceptibility signal. Right MCA stent. Missing superior division as noted above. Inferior division shows flow. Electronically Signed   By: Nelson Chimes M.D.   On: 12/30/2019 19:21   MR BRAIN WO CONTRAST  Result Date: 12/30/2019 CLINICAL DATA:  Status post right M1 occlusion with intervention EXAM: MRI HEAD WITHOUT CONTRAST MRA HEAD WITHOUT CONTRAST TECHNIQUE: Multiplanar, multiecho pulse sequences of the brain and surrounding structures were obtained without intravenous contrast. Angiographic images of the head were obtained using MRA technique without contrast. COMPARISON:  Multiple CT studies done yesterday. FINDINGS: MRI HEAD FINDINGS Brain: Diffusion imaging shows acute infarction affecting the right occipital cortex. Acute infarction affects the right insular region and frontal operculum. Small foci of acute infarction elsewhere in the right frontal and parietal regions. Areas of infarction show mild swelling but there is no mass effect. Small focus of susceptibility in the right sylvian fissure region may be intravascular. Few small foci of susceptibility in the right parietal region also suspected to be intravascular. No sign of parenchymal hematoma. No hydrocephalus. No extra-axial collection. Mild chronic small-vessel  ischemic changes elsewhere throughout the cerebral hemispheric white matter. 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 Vascular: Stent in the right M1 region. Skull and upper cervical spine: Negative Sinuses/Orbits: Clear/normal Other: None MRA HEAD FINDINGS Both internal carotid arteries show antegrade flow through the skull base. On the left, there is supply in the left MCA territory and both anterior cerebral artery territories. Moderate atherosclerotic irregularity of the MCA branches. On the right, there is signal loss related to the right MCA stent. More distal MCA branch vessels do show supply, with absence of the superior division. Right vertebral artery terminates in PICA. Left vertebral artery supplies the basilar. 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 Left PCA arises from the basilar tip. Distal vessel atherosclerotic irregularity. Right PCA arises from the right carotid and shows poor supply of the distal branches. 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 Fetal origin right PCA with poor flow in the distal branch vessels. Acute infarction of the right insular region and frontal operculum. Missing superior division right MCA. Small scattered other acute infarctions in the right frontal and parietal regions. Swelling but no mass effect. No intraparenchymal hematoma. Few small foci of petechial blood versus vascular susceptibility signal. Right MCA stent. Missing superior division as noted above. Inferior division shows flow. Electronically Signed   By: Nelson Chimes M.D.   On: 12/30/2019 19:21   DG CHEST PORT 1 VIEW  Result Date: 12/31/2019 CLINICAL DATA:  Status post  extubation EXAM: PORTABLE CHEST 1 VIEW COMPARISON:  12/30/2019 FINDINGS: Cardiac shadow remains enlarged. Aortic calcifications are again seen. Feeding catheter is noted extending into the stomach. Endotracheal tube has  been removed in the interval. The lungs are well aerated without focal infiltrate or sizable effusion. IMPRESSION: Status post extubation. No acute abnormality noted. Electronically Signed   By: Inez Catalina M.D.   On: 12/31/2019 11:27   VAS [place] GROIN PSEUDOANEURYSM  Result Date: 12/31/2019  ARTERIAL PSEUDOANEURYSM  Exam: Right groin Indications: Patient complains of bruising. Comparison Study: no prior Performing Technologist: Abram Sander RVS  Examination Guidelines: A complete evaluation includes B-mode imaging, spectral Doppler, color Doppler, and power Doppler as needed of all accessible portions of each vessel. Bilateral testing is considered an integral part of a complete examination. Limited examinations for reoccurring indications may be performed as noted. +------------+----------+--------+------+----------+  Right Duplex PSV (cm/s) Waveform Plaque Comment(s)  +------------+----------+--------+------+----------+  CFA             147     biphasic                    +------------+----------+--------+------+----------+  Prox SFA        146     biphasic                    +------------+----------+--------+------+----------+  Summary: No evidence of pseudoaneurysm, AVF or DVT  Diagnosing physician: Deitra Mayo MD Electronically signed by Deitra Mayo MD on 12/31/2019 at 8:54:10 AM.   --------------------------------------------------------------------------------    Final    ECHOCARDIOGRAM LIMITED  Result Date: 12/30/2019    ECHOCARDIOGRAM LIMITED REPORT   Patient Name:   KIMBERY HARWOOD Date of Exam: 12/30/2019 Medical Rec #:  [id number]           Height:       62.0 in Accession #:    [id number]          Weight:       211.0 lb Date of Birth:  10-01-1945            BSA:          1.956 m Patient Age:    89 years            BP:           132/80 mmHg Patient Gender: F                   HR:           64 bpm. Exam Location:  Inpatient Procedure: Limited Color Doppler, Cardiac Doppler and  Limited Echo Indications:    stroke 434.91  History:        Patient has prior history of Echocardiogram examinations, most                 recent 11/09/2019. Arrythmias:Atrial Fibrillation.  Sonographer:    Johny Chess Referring Phys: [id number] [place]  1. Left ventricular ejection fraction, by estimation, is >75%. The left ventricle has hyperdynamic function. The left ventricle has no regional wall motion abnormalities. There is severe left ventricular hypertrophy. Left ventricular diastolic parameters are indeterminate.  2. Right ventricular systolic function is normal. The right ventricular size is normal. There is mildly elevated pulmonary artery systolic pressure.  3. Tricuspid valve regurgitation is mild to moderate.  4. The aortic valve is abnormal. Aortic valve regurgitation is mild to moderate. Mild aortic valve sclerosis is present, with no evidence of aortic valve  stenosis.  5. The inferior vena cava is normal in size with greater than 50% respiratory variability, suggesting right atrial pressure of 3 mmHg. FINDINGS  Left Ventricle: Left ventricular ejection fraction, by estimation, is >75%. The left ventricle has hyperdynamic function. The left ventricle has no regional wall motion abnormalities. The left ventricular internal cavity size was small. There is severe left ventricular hypertrophy. Right Ventricle: The right ventricular size is normal. No increase in right ventricular wall thickness. Right ventricular systolic function is normal. There is mildly elevated pulmonary artery systolic pressure. The tricuspid regurgitant velocity is 3.14  m/s, and with an assumed right atrial pressure of 3 mmHg, the estimated right ventricular systolic pressure is 43.3 mmHg. Pericardium: Trivial pericardial effusion is present. Mitral Valve: The mitral valve is abnormal. There is mild thickening of the mitral valve leaflet(s). Moderate mitral annular calcification. Tricuspid Valve: The  tricuspid valve is grossly normal. Tricuspid valve regurgitation is mild to moderate. Aortic Valve: The aortic valve is abnormal. Aortic valve regurgitation is mild to moderate. Mild aortic valve sclerosis is present, with no evidence of aortic valve stenosis. Pulmonic Valve: The pulmonic valve was normal in structure. Pulmonic valve regurgitation is not visualized. Aorta: The aortic root and ascending aorta are structurally normal, with no evidence of dilitation. Venous: The inferior vena cava is normal in size with greater than 50% respiratory variability, suggesting right atrial pressure of 3 mmHg.  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 Stroke: Continue secondary stroke prophylaxis and Risk Factor Modification listed below:   Antiplatelet therapy:   Blood Pressure Management:  Continue current medication with prn's with permisive HTN per primary team Statin Agent:   Left sided hemiparesis: fit for orthosis to prevent contractures (resting hand splint for day, wrist cock up splint at night, PRAFO, etc) PT/OT for mobility, ADL training  Motor recovery: Fluoxetine Labs and images (see above) independently reviewed.  Records reviewed and summated above.  1. Does the need for close, 24 hr/day medical supervision in concert with the patient's rehab needs make it unreasonable for this patient to be served in a less intensive setting? Yes  2. Co-Morbidities  requiring supervision/potential complications: atrial fibrillation (monitor heart rate with increased exertion), HTN (monitor and provide prns in accordance with increased physical exertion and pain), bilateral ureteral stent for renal stones, AKI on CKD (avoid nephrotoxic meds, follow-up labs), ABLA (repeat labs, consider transfusion if necessary to ensure appropriate perfusion for increased activity tolerance) 3. Due to bladder management, bowel management, safety, skin/wound care, disease management, medication administration, pain management and patient education, does the patient require 24  hr/day rehab nursing? Yes 4. Does the patient require coordinated care of a physician, rehab nurse, therapy disciplines of PT/OT/SLP to address physical and functional deficits in the context of the above medical diagnosis(es)? Yes Addressing deficits in the following areas: balance, endurance, locomotion, strength, transferring, bowel/bladder control, bathing, dressing, feeding, grooming, toileting, cognition, speech, language, swallowing and psychosocial support 5. Can the patient actively participate in an intensive therapy program of at least 3 hrs of therapy per day at least 5 days per week? Potentially 6. The potential for patient to make measurable gains while on inpatient rehab is excellent 7. Anticipated functional outcomes upon discharge from inpatient rehab are min assist and mod assist  with PT, min assist and mod assist with OT, supervision and min assist with SLP. 8. Estimated rehab length of stay to reach the above functional goals is: 28-33 days. 9. Anticipated discharge destination: Other 10. Overall Rehab/Functional Prognosis: good  RECOMMENDATIONS: This patient's condition is appropriate for continued rehabilitative care in the following setting: CIR to decrease burden of care Patient has agreed to participate in recommended program. Potentially Note that insurance prior authorization may be  required for reimbursement for recommended care.  Comment: Rehab Admissions Coordinator to follow up.  I have personally performed a face to face diagnostic evaluation, including, but not limited to relevant history and physical exam findings, of this patient and developed relevant assessment and plan.  Additionally, I have reviewed and concur with the physician assistant's documentation above.   Delice Lesch, MD, ABPMR Lavon Paganini Angiulli, PA-C 01/01/2020

## 2020-01-01 NOTE — Evaluation (Signed)
Clinical/Bedside Swallow Evaluation Patient Details  Name: Gloria [name] MRN: [id number] Date of Birth: 1945/08/26  Today's Date: 01/01/2020 Time: SLP Start Time (ACUTE ONLY): 1025 SLP Stop Time (ACUTE ONLY): 1040 SLP Time Calculation (min) (ACUTE ONLY): 15 min  Past Medical History:  Past Medical History:  Diagnosis Date  . Arthritis    knees  . Atrial fibrillation ([place])   . Dysrhythmia   . History of kidney stones   . HOH (hard of hearing)   . Hypertension    Past Surgical History:  Past Surgical History:  Procedure Laterality Date  . CHOLECYSTECTOMY    . CYSTOSCOPY WITH STENT PLACEMENT Bilateral 11/09/2019   Procedure: CYSTOSCOPY BILATERAL URETERAL STENT PLACEMENT;  Surgeon: Ardis Hughs, MD;  Location: [facility];  Service: Urology;  Laterality: Bilateral;  . CYSTOSCOPY/URETEROSCOPY/HOLMIUM LASER/STENT PLACEMENT Right 11/22/2019   Procedure: CYSTOSCOPY RIGHT URETEROSCOPY/HOLMIUM LASER STONE EXTRACTION /STENT EXCHANGE;  Surgeon: Ardis Hughs, MD;  Location: [facility];  Service: Urology;  Laterality: Right;  . CYSTOSCOPY/URETEROSCOPY/HOLMIUM LASER/STENT PLACEMENT Left 12/06/2019   Procedure: CYSTOSCOPY BILATERAL URETEROSCOPY/HOLMIUM LASER/STENT EXCHANGE;  Surgeon: Ardis Hughs, MD;  Location: [facility];  Service: Urology;  Laterality: Left;  . CYSTOSCOPY/URETEROSCOPY/HOLMIUM LASER/STENT PLACEMENT Bilateral 12/25/2019   Procedure: BILATERAL URETEROSCOPY/HOLMIUM LASER LEFT, BILATERAL STONE EXTRACTION BILATERAL STENT EXCHANGE;  Surgeon: Ardis Hughs, MD;  Location: [facility];  Service: Urology;  Laterality: Bilateral;  . IR CT HEAD LTD  12/30/2019  . IR INTRA CRAN STENT  12/30/2019  . IR PERCUTANEOUS ART THROMBECTOMY/INFUSION INTRACRANIAL INC DIAG ANGIO  12/30/2019  . RADIOLOGY WITH ANESTHESIA N/A 12/29/2019   Procedure: IR WITH ANESTHESIA;  Surgeon: Radiologist, Medication, MD;  Location: [facility];  Service: Radiology;  Laterality: N/A;   HPI:  This 74 y.o. female admitted  with flaccid Lt side and Rt gaze preference.  Pt received tPA.  CTA revealed Rt M1 occlusion and she was taken for emergent thrombectomy.  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  She was extubated 12/31/2019. PMH:  Pt is very HOH - reads lips, HTN, h/o kidney stones, A-Fib; s/p multiple cystocopies with stent placement.     Assessment / Plan / Recommendation Clinical Impression  Pt had limited PO intake, impacted overall by mentation as she is intermittently drowsy but also skeptical of taking POs. She said that she didn't know "whose mouth that had been in" when offering various POs and oral care. She had pooling of thicker secretions in her mouth and when she did take small amounts of water, there was a large amount of L sided loss and additional oral residue after she swallowed. Her awareness of POs, particularly on her left, seems poor. Would continue use of Cortrak today with additional f/u from SLP to better assess oropharyngeal swallow.  SLP Visit Diagnosis: Dysphagia, unspecified (R13.10)    Aspiration Risk  Moderate aspiration risk    Diet Recommendation NPO;Alternative means - temporary   Medication Administration: Via alternative means    Other  Recommendations Oral Care Recommendations: Oral care QID Other Recommendations: Have oral suction available   Follow up Recommendations Inpatient Rehab      Frequency and Duration min 2x/week  2 weeks       Prognosis Prognosis for Safe Diet Advancement: Good Barriers to Reach Goals: Cognitive deficits      Swallow Study   General HPI: This 74 y.o.  female admitted with flaccid Lt side and Rt gaze preference.  Pt received tPA.  CTA revealed Rt M1 occlusion and she was taken for emergent thrombectomy.  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  She was extubated 12/31/2019. PMH:  Pt is very HOH - reads lips, HTN, h/o kidney stones, A-Fib; s/p multiple cystocopies with stent placement.   Type of Study: Bedside Swallow Evaluation Previous Swallow Assessment: none in chart Diet Prior to this Study: NPO;NG Tube Temperature Spikes Noted: No Respiratory Status: Room air History of Recent Intubation: Yes Length of Intubations (days): 2 days Date extubated: 12/31/19 Behavior/Cognition: Lethargic/Drowsy;Requires cueing Oral Cavity Assessment: Excessive secretions Oral Care Completed by SLP: Other (Comment) (attempted) Oral Cavity - Dentition: Adequate natural dentition Vision: Impaired for self-feeding (field cut) Self-Feeding Abilities: Needs assist Patient Positioning: Upright in bed Baseline Vocal Quality: Normal    Oral/Motor/Sensory Function Overall Oral Motor/Sensory Function: Moderate impairment Facial ROM: Reduced left;Suspected CN VII (facial) dysfunction Facial Symmetry: Abnormal symmetry left;Suspected CN VII (facial) dysfunction Facial Strength: Reduced left;Suspected CN VII (facial) dysfunction Lingual Symmetry: Within Functional Limits   Ice Chips Ice chips:  (would not take)   Thin Liquid Thin Liquid: Impaired Presentation: Cup;Self Fed;Spoon Oral Phase Impairments: Reduced labial seal;Poor awareness of bolus Oral Phase Functional Implications: Left anterior spillage;Oral residue    Nectar Thick Nectar Thick Liquid: Not tested   Honey Thick Honey Thick Liquid: Not tested   Puree Puree: Not tested   Solid     Solid: Not tested      Osie Bond., M.A. [place] Pager [phone] Office [phone]  01/01/2020,11:17 AM

## 2020-01-01 NOTE — Progress Notes (Signed)
Rehab Admissions Coordinator Note:  Patient was screened by Cleatrice Burke for appropriateness for an Inpatient Acute Rehab Consult per PT recs. I would like to await further progress with therapy before requesting CIR consult. I will follow.  Cleatrice Burke RN MSN 01/01/2020, 9:14 AM  I can be reached at [phone].

## 2020-01-01 NOTE — Progress Notes (Signed)
STROKE TEAM PROGRESS NOTE   SUBJECTIVE (INTERVAL HISTORY) RN at bedside. Pt was extubated yesterday, tolerating well. Eyes closed but easily open on touch. She is able to say "stop it" with pain stimulation. 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 Still has left hemiplegia and right forced gaze. On TF now.    OBJECTIVE Temp:  [98.1 F (36.7 C)-99.3 F (37.4 C)] 98.8 F (37.1 C) (06/30 0900) Pulse Rate:  [57-84] 59 (06/30 0900) Cardiac Rhythm: Atrial fibrillation (06/30 0800) Resp:  [16-29] 16 (06/30 0900) BP: (110-158)/(49-81) 132/55 (06/30 0900) SpO2:  [96 %-100 %] 100 % (06/30 0900) Weight:  [96.6 kg] 96.6 kg (06/30 0500)  Recent Labs  Lab 12/31/19 1612 12/31/19 1935 12/31/19 2341 01/01/20 0339 01/01/20 0810  GLUCAP 92 120* 119* 120* 120*   Recent Labs  Lab 12/29/19 1802 12/29/19 1802 12/29/19 1846 12/29/19 1846 12/29/19 2300 12/30/19 0134 12/30/19 0500 12/31/19 0342 12/31/19 1211 12/31/19 1655 01/01/20 0615  NA 141   < > 140   < > 139 141 141 141  --   --  142  K 5.3*   < > 5.2*   < > 4.4 4.3 4.5 4.7  --   --  4.3  CL 106   < > 106  --  108  --  113* 112*  --   --  112*  CO2 24  --   --   --   --   --  22 19*  --   --  22  GLUCOSE 109*   < > 103*  --  134*  --  149* 103*  --   --  125*  BUN 32*   < > 34*  --  26*  --  28* 33*  --   --  32*  CREATININE 1.76*   < > 1.90*  --  1.50*  --  1.44* 1.88*  --   --  1.64*  CALCIUM 8.9   < >  --   --   --   --  7.9* 8.0*  --   --  8.1*  MG  --   --   --   --   --   --   --  1.9 1.8 1.8 1.8  PHOS  --   --   --   --   --   --   --   --  4.0 3.5 3.3   < > = values in this interval not displayed.   Recent Labs  Lab 12/29/19 1802  AST 17  ALT 11  ALKPHOS 82  BILITOT 0.6  PROT 6.2*  ALBUMIN 3.2*   Recent Labs  Lab 12/29/19 1802 12/29/19 1846 12/29/19 2300 12/30/19 0134 12/30/19 0500 12/31/19 0342 01/01/20 0615  WBC 5.8  --   --   --  7.8 10.4 6.0  NEUTROABS 3.9  --   --   --  7.3  --   --   HGB 11.8*    < > 9.2* 8.8* 8.4* 7.8* 7.3*  HCT 38.3   < > 27.0* 26.0* 26.6* 24.7* 23.5*  MCV 104.4*  --   --   --  103.1* 103.8* 104.9*  PLT 225  --   --   --  200 223 157   < > = values in this interval not displayed.   No results for input(s): CKTOTAL, CKMB, CKMBINDEX, TROPONINI in the last 168 hours. Recent Labs    12/29/19 1802  LABPROT 13.3  INR  1.1   No results for input(s): COLORURINE, LABSPEC, PHURINE, GLUCOSEU, HGBUR, BILIRUBINUR, KETONESUR, PROTEINUR, UROBILINOGEN, NITRITE, LEUKOCYTESUR in the last 72 hours.  Invalid input(s): APPERANCEUR     Component Value Date/Time   CHOL 143 12/30/2019 0500   TRIG 110 01/01/2020 0615   HDL 41 12/30/2019 0500   CHOLHDL 3.5 12/30/2019 0500   VLDL 12 12/30/2019 0500   LDLCALC 90 12/30/2019 0500   Lab Results  Component Value Date   HGBA1C 5.2 12/30/2019   No results found for: LABOPIA, COCAINSCRNUR, LABBENZ, AMPHETMU, THCU, LABBARB  No results for input(s): ETH in the last 168 hours.  I have personally reviewed the radiological images below and agree with the radiology interpretations.  CT ABDOMEN PELVIS WO CONTRAST  Result Date: 12/29/2019 CLINICAL DATA:  Right inguinal hematoma, recent neuro interventional procedure, assess for retroperitoneal hematoma EXAM: CT ABDOMEN AND PELVIS WITHOUT CONTRAST TECHNIQUE: Multidetector CT imaging of the abdomen and pelvis was performed following the standard protocol without IV contrast. COMPARISON:  11/08/2019 FINDINGS: Lower chest: Hypoventilatory changes are seen within the dependent lower lobes. Heart is enlarged with trace pericardial fluid unchanged. Hepatobiliary: Gallbladder surgically absent. Unenhanced imaging the liver demonstrates no gross abnormalities. Pancreas: Unremarkable. No pancreatic ductal dilatation or surrounding inflammatory changes. Spleen: Normal in size without focal abnormality. Adrenals/Urinary Tract: Excreted contrast is seen within the bilateral kidneys related to previous neuro  interventional procedure. Bilateral renal cortical thinning is noted. Stable bilateral adrenal adenoma. Bilateral ureteral stents extend from the renal pelves into the bladder lumen. No filling defects within the bladder. Stomach/Bowel: Enteric catheter extends into the gastric lumen. No bowel obstruction or ileus. No bowel wall thickening or inflammatory change. Vascular/Lymphatic: Aortic atherosclerosis. No enlarged abdominal or pelvic lymph nodes. Reproductive: Uterus and bilateral adnexa are unremarkable. Other: Hematoma is seen within the right inguinal region extending along the inguinal crease into the pannus of the right lower quadrant abdominal wall. This measures up to 3.7 cm in thickness. There is no evidence of extension into the retroperitoneal space. At the time of the exam, extrinsic compression is being held at the right groin. There is no free fluid or free gas within the peritoneal cavity. Fat containing hernia again noted within the left lower quadrant abdominal wall. No bowel herniation. Musculoskeletal: No acute or destructive bony lesions. Reconstructed images demonstrate no additional findings. IMPRESSION: 1. Hematoma within the right inguinal region extending along the inguinal crease into the pannus of the right lower quadrant abdominal wall. No extension into the retroperitoneal space. 2. Bilateral ureteral stents as above. 3. Stable bilateral adrenal adenoma. 4. Aortic Atherosclerosis (ICD10-I70.0). Electronically Signed   By: Randa Ngo M.D.   On: 12/29/2019 23:48   CT Code Stroke CTA Head W/WO contrast  Result Date: 12/29/2019 CLINICAL DATA:  Left-sided weakness, code stroke follow-up EXAM: CT ANGIOGRAPHY HEAD AND NECK TECHNIQUE: Multidetector CT imaging of the head and neck was performed using the standard protocol during bolus administration of intravenous contrast. Multiplanar CT image reconstructions and MIPs were obtained to evaluate the vascular anatomy. Carotid stenosis  measurements (when applicable) are obtained utilizing NASCET criteria, using the distal internal carotid diameter as the denominator. CONTRAST:  41mL OMNIPAQUE IOHEXOL 350 MG/ML SOLN COMPARISON:  None. FINDINGS: CTA NECK Aortic arch: Minimal calcified plaque along the aortic arch. Mild calcified plaque at the great vessel origins, which are patent. Right carotid system: Patent. Common carotid has a retropharyngeal course. Mild calcified plaque at the ICA origin causing minimal stenosis. Left carotid system: Patent. Common carotid  has a retropharyngeal course. Mild calcified plaque at the ICA origin causing minimal stenosis. Vertebral arteries: Patent. Left vertebral artery is dominant. Suspected stenosis of the proximal right vertebral artery. Skeleton: Degenerative changes of the cervical spine. Other neck: No mass or adenopathy. Upper chest: Enlargement of the main pulmonary artery suggesting pulmonary arterial hypertension. Review of the MIP images confirms the above findings CTA HEAD Anterior circulation: Intracranial internal carotid arteries are patent with calcified plaque causing mild stenosis. Anterior cerebral arteries are patent. Left A1 ACA is dominant with diminutive or absent right A1 segment. There is occlusion of the mid right M1 MCA. There is preserved opacification of the more distal right MCA territory. Left MCA is patent. Posterior circulation: Intracranial vertebral arteries are patent. Basilar artery is patent. Posterior cerebral arteries are patent. There are posterior communicating arteries present bilaterally with fetal origin of the right PCA. Venous sinuses: Patent as allowed by contrast bolus timing. Review of the MIP images confirms the above findings IMPRESSION: Occlusion of the right M1 MCA. However, there is good filling of the more distal MCA territory. No hemodynamically significant stenosis in the neck. These results were communicated to Dr. Lorraine Lax at [place] 6/27/2021by text page  via the [facility] messaging system. Electronically Signed   By: Macy Mis M.D.   On: 12/29/2019 19:10   CT HEAD WO CONTRAST  Result Date: 12/29/2019 CLINICAL DATA:  Stroke follow-up.  Status post thrombectomy EXAM: CT HEAD WITHOUT CONTRAST TECHNIQUE: Contiguous axial images were obtained from the base of the skull through the vertex without intravenous contrast. COMPARISON:  Head CT 12/29/2019 FINDINGS: Brain: There is hyperdense material in the right subarachnoid space, overlying the frontal operculum and within the sylvian fissure. No other extra-axial collection. No midline shift or other mass effect. No intraparenchymal hemorrhage. 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 Vascular: Status post right MCA stent placement. Skull: Normal. Negative for fracture or focal lesion. Sinuses/Orbits: No acute finding. Other: None. IMPRESSION: 1. Hyperdense material in the right subarachnoid space, overlying the frontal operculum and within the Sylvian fissure. This is most likely contrast staining, but follow-up studies will be necessary to differentiate from subarachnoid hemorrhage. 2. Unchanged left parafalcine meningioma. Electronically Signed   By: Ulyses Jarred M.D.   On: 12/29/2019 23:49   CT Code Stroke CTA Neck W/WO contrast  Result Date: 12/29/2019 CLINICAL DATA:  Left-sided weakness, code stroke follow-up EXAM: CT ANGIOGRAPHY HEAD AND NECK TECHNIQUE: Multidetector CT imaging of the head and neck was performed using the standard protocol during bolus administration of intravenous contrast. Multiplanar CT image reconstructions and MIPs were obtained to evaluate the vascular anatomy. Carotid stenosis measurements (when applicable) are obtained utilizing NASCET criteria, using the distal internal carotid diameter as the denominator. CONTRAST:  72mL OMNIPAQUE IOHEXOL 350 MG/ML SOLN COMPARISON:  None. FINDINGS: CTA NECK Aortic arch: Minimal calcified plaque along the aortic arch. Mild calcified plaque at the  great vessel origins, which are patent. Right carotid system: Patent. Common carotid has a retropharyngeal course. Mild calcified plaque at the ICA origin causing minimal stenosis. Left carotid system: Patent. Common carotid has a retropharyngeal course. Mild calcified plaque at the ICA origin causing minimal stenosis. Vertebral arteries: Patent. Left vertebral artery is dominant. Suspected stenosis of the proximal right vertebral artery. Skeleton: Degenerative changes of the cervical spine. Other neck: No mass or adenopathy. Upper chest: Enlargement of the main pulmonary artery suggesting pulmonary arterial hypertension. Review of the MIP images confirms the above findings CTA HEAD Anterior circulation: Intracranial internal carotid  arteries are patent with calcified plaque causing mild stenosis. Anterior cerebral arteries are patent. Left A1 ACA is dominant with diminutive or absent right A1 segment. There is occlusion of the mid right M1 MCA. There is preserved opacification of the more distal right MCA territory. Left MCA is patent. Posterior circulation: Intracranial vertebral arteries are patent. Basilar artery is patent. Posterior cerebral arteries are patent. There are posterior communicating arteries present bilaterally with fetal origin of the right PCA. Venous sinuses: Patent as allowed by contrast bolus timing. Review of the MIP images confirms the above findings IMPRESSION: Occlusion of the right M1 MCA. However, there is good filling of the more distal MCA territory. No hemodynamically significant stenosis in the neck. These results were communicated to Dr. Lorraine Lax at [place] 6/27/2021by text page via the [facility] messaging system. Electronically Signed   By: Macy Mis M.D.   On: 12/29/2019 19:10   MR ANGIO HEAD WO CONTRAST  Result Date: 12/30/2019 CLINICAL DATA:  Status post right M1 occlusion with intervention EXAM: MRI HEAD WITHOUT CONTRAST MRA HEAD WITHOUT CONTRAST TECHNIQUE: Multiplanar,  multiecho pulse sequences of the brain and surrounding structures were obtained without intravenous contrast. Angiographic images of the head were obtained using MRA technique without contrast. COMPARISON:  Multiple CT studies done yesterday. FINDINGS: MRI HEAD FINDINGS Brain: Diffusion imaging shows acute infarction affecting the right occipital cortex. Acute infarction affects the right insular region and frontal operculum. Small foci of acute infarction elsewhere in the right frontal and parietal regions. Areas of infarction show mild swelling but there is no mass effect. Small focus of susceptibility in the right sylvian fissure region may be intravascular. Few small foci of susceptibility in the right parietal region also suspected to be intravascular. No sign of parenchymal hematoma. No hydrocephalus. No extra-axial collection. Mild chronic small-vessel ischemic changes elsewhere throughout the cerebral hemispheric white matter. 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 Vascular: Stent in the right M1 region. Skull and upper cervical spine: Negative Sinuses/Orbits: Clear/normal Other: None MRA HEAD FINDINGS Both internal carotid arteries show antegrade flow through the skull base. On the left, there is supply in the left MCA territory and both anterior cerebral artery territories. Moderate atherosclerotic irregularity of the MCA branches. On the right, there is signal loss related to the right MCA stent. More distal MCA branch vessels do show supply, with absence of the superior division. Right vertebral artery terminates in PICA. Left vertebral artery supplies the basilar. 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 Left PCA arises from the basilar tip. Distal vessel atherosclerotic irregularity. Right PCA arises from the right carotid and shows poor supply of the distal branches. 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 Fetal origin right PCA with poor flow in the distal branch vessels. Acute infarction of the right insular region and frontal operculum. Missing superior division right MCA. Small scattered other acute infarctions in the right frontal and parietal regions. Swelling but no mass effect. No intraparenchymal hematoma. Few small foci of petechial blood versus vascular susceptibility signal. Right MCA stent. Missing superior division as noted above. Inferior division shows flow. Electronically Signed   By: Nelson Chimes M.D.   On: 12/30/2019 19:21   MR BRAIN WO CONTRAST  Result Date: 12/30/2019 CLINICAL DATA:  Status post right M1 occlusion with intervention EXAM: MRI HEAD WITHOUT CONTRAST MRA HEAD WITHOUT CONTRAST TECHNIQUE: Multiplanar, multiecho pulse sequences of the brain and surrounding structures  were obtained without intravenous contrast. Angiographic images of the head were obtained using MRA technique without contrast. COMPARISON:  Multiple CT studies done yesterday. FINDINGS: MRI HEAD FINDINGS Brain: Diffusion imaging shows acute infarction affecting the right occipital cortex. Acute infarction affects the right insular region and frontal operculum. Small foci of acute infarction elsewhere in the right frontal and parietal regions. Areas of infarction show mild swelling but there is no mass effect. Small focus of susceptibility in the right sylvian fissure region may be intravascular. Few small foci of susceptibility in the right parietal region also suspected to be intravascular. No sign of parenchymal hematoma. No hydrocephalus. No extra-axial collection. Mild chronic small-vessel ischemic changes elsewhere throughout the cerebral hemispheric white matter. 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 Vascular: Stent in the right M1 region. Skull and upper cervical spine: Negative Sinuses/Orbits: Clear/normal  Other: None MRA HEAD FINDINGS Both internal carotid arteries show antegrade flow through the skull base. On the left, there is supply in the left MCA territory and both anterior cerebral artery territories. Moderate atherosclerotic irregularity of the MCA branches. On the right, there is signal loss related to the right MCA stent. More distal MCA branch vessels do show supply, with absence of the superior division. Right vertebral artery terminates in PICA. Left vertebral artery supplies the basilar. 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 Left PCA arises from the basilar tip. Distal vessel atherosclerotic irregularity. Right PCA arises from the right carotid and shows poor supply of the distal branches. 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 Fetal origin right PCA with poor flow in the distal branch vessels. Acute infarction of the right insular region and frontal operculum. Missing superior division right MCA. Small scattered other acute infarctions in the right frontal and parietal regions. Swelling but no mass effect. No intraparenchymal hematoma. Few small foci of petechial blood versus vascular susceptibility signal. Right MCA stent. Missing superior division as noted above. Inferior division shows flow. Electronically Signed   By: Nelson Chimes M.D.   On: 12/30/2019 19:21   IR Intra Cran Stent  Result Date: 12/31/2019 INDICATION: New onset left-sided hemiplegia, right gaze deviation. Occluded right middle cerebral M1 segment on CT angiogram of the head and neck. EXAM: 1. EMERGENT LARGE VESSEL OCCLUSION THROMBOLYSIS (anterior CIRCULATION) COMPARISON:  CT angiogram of the head and neck of June, 27, 2021. MEDICATIONS: Vancomycin 1 g IV was administered within 1 hour of the procedure. ANESTHESIA/SEDATION: General anesthesia CONTRAST:  Isovue 300 approximately 170 mL FLUOROSCOPY TIME:  Fluoroscopy Time: 131 minutes 12 seconds (4835 mGy). COMPLICATIONS: None immediate.  TECHNIQUE: Following a full explanation of the procedure along with the potential associated complications, an informed witnessed consent was obtained the patient's son. The risks of intracranial hemorrhage of 10%, worsening neurological deficit, ventilator dependency, death and inability to revascularize were all reviewed in detail with the patient's son. The patient was then put under general anesthesia by the Department of Anesthesiology at [facility]. The right groin was prepped and draped in the usual sterile fashion. Thereafter using modified Seldinger technique, transfemoral access into the right common femoral artery was obtained without difficulty. Over a 0.035 inch guidewire an 8 French 25 cm Pinnacle sheath was inserted. Through this, and also over a 0.035 inch guidewire a combination of a select 5 French 125 cm Simmons 2 catheter inside of a 95 cm 087 balloon guide catheter was advanced without difficulty to the  right common carotid artery. The guidewire and the select catheter were removed. Good aspiration was obtained from the hub of the balloon guide catheter just proximal to the right common carotid artery bifurcation. Arteriogram was then performed centered extra cranially and intracranially. FINDINGS: The right common carotid arteriogram demonstrates the right external carotid artery and its major branches to be widely patent. The right internal carotid artery at the bulb demonstrates a smooth shallow plaque. Moderate tortuosity was noted of the proximal right internal carotid artery. 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 Distal to this the cervical petrous junction demonstrates normal opacification. Distal to this the petrous, cavernous, and the cavernous  segments demonstrate patency as does the supraclinoid segment. Opacification is seen of the right posterior communicating artery opacifying the right posterior cerebral distribution. The right middle cerebral artery demonstrates complete angiographic occlusion at the origin of the anterior temporal branch. Focal areas of caliber irregularity are seen of the proximal right middle cerebral artery. PROCEDURE: Through the 087 balloon guide catheter in the proximal right internal carotid artery, a Zoom 071 135 cm catheter inside of which was an 021 160 cm Phenom microcatheter was advanced over a 0.014 inch standard Synchro micro guidewire gingerly through the right internal carotid artery in the neck and into the supraclinoid right ICA. The micro guidewire was then gently manipulated with a torque device and advanced into the inferior division M2 M3 region of the right middle cerebral artery followed by the microcatheter. The guidewire was removed. Good aspiration was obtained from the hub of the microcatheter. Gentle contrast injection demonstrated safe position of tip of the microcatheter. A 5 mm x 37 mm Embotrap retrieval device was then advanced to the distal end of the microcatheter. The O ring on the delivery micro guidewire was loosened. With slight forward gentle traction with the right hand on the delivery micro guidewire, with the left hand the retrieval device was deployed. The 071 Zoom catheter was then advanced into the right middle cerebral artery engaging the occluded right middle cerebral artery. With constant flow arrest in the right internal carotid artery, and constant aspiration using a Penumbra device at the hub of the 071 Zoom catheter for approximately 3 minutes, the combination of the retrieval device, the microcatheter, and the Zoom aspiration catheter were retrieved and removed. Following reversal of flow arrest, a control arteriogram performed through the balloon guide catheter in the right  internal carotid artery demonstrates revascularization of the right middle cerebral artery and also of the superior and inferior divisions. The anterior temporal branch remains patent. A TICI 2B revascularization was achieved. This also unmasked an irregular filling defect at the right middle cerebral artery terminus at the trifurcation region with flow noted in the distal inferior division. A second pass was then made again with the combination of the 071 Zoom catheter advanced over an 021 160 cm Phenom microcatheter over a 0.014 inch standard Synchro micro guidewire. Again access was obtained into the distal M2 M3 region of the inferior division with the micro guidewire then followed by the microcatheter. The guidewire was removed. Again safe position of the tip of the microcatheter was confirmed and connected to continuous heparinized saline infusion. A 4 mm x 40 mm Solitaire X retrieval device was then deployed in the manner described above. Again with proximal flow  arrest in the right internal carotid artery proximally, and constant aspiration via the Zoom catheter which was now imbedded in the occluded right middle cerebral artery at its trifurcation region over approximately 2-1/2 minutes, the combination of the retrieval device, the microcatheter, and the Zoom catheter was retrieved and removed. Following reversal of flow arrest, a control arteriogram performed through the right internal carotid artery demonstrated improved opacification of the right MCA trifurcation branches. There continued be occluded superior division. The combination of the microcatheter, inside a 071 Zoom aspiration catheter was again advanced to the right middle cerebral artery over a 0.014 inch standard Synchro micro guidewire, which was now advanced into the superior division of the right middle cerebral artery emanating at the MCA trifurcation region. The microcatheter was advanced to the M2 region over a micro guidewire. The micro  guidewire was removed. Good aspiration obtained from the hub of the microcatheter. Again with flow arrest in the right internal carotid artery proximally, and constant aspiration with the Penumbra aspiration device at the hub of the aspiration catheter following deployment of a 3 mm x 20 mm Solitaire X retrieval device, for 2 minutes, the combination of the retrieval device, the microcatheter and the Zoom catheter were retrieved and removed. The superior division continued to be occluded. The superior division was again cannulated with a microcatheter over a micro guidewire as described above. After having verified safe position of the tip of the microcatheter, in the superior division M2 region, a 4 mm x 40 mm Solitaire X retrieval device was deployed in the usual manner. Again with proximal flow arrest in the proximal right ICA, and constant aspiration at the hub of the Zoom catheter at the site of the occluded superior division in the distal right middle cerebral M1 segment over 2 minutes, the combination of the retrieval device, the microcatheter and the retrieval device were retrieved and removed. Following reverse of flow arrest, there appeared to be modest improved flow in the proximal superior division. It was now noted the inferior division had a near occlusive clot in the proximal aspect. A fourth pass was then made this time using a combination of the Phenom microcatheter, inside of a 6 [place] Catalyst 135 cm catheter advanced over a 0.014 inch standard Synchro micro guidewire to the distal right M1 segment. The micro guidewire was then advanced into the M2 M3 region of the inferior division followed by the microcatheter. The guidewire was removed. A 4 mm x 40 mm Solitaire X retrieval device was deployed with the Catalyst guide catheter advanced at the origin of inferior division and distal to this. Following proximal flow arrest and constant aspiration for approximately 2 minutes at the hub of the  Catalyst guide catheter, the combination of the retrieval device, the microcatheter and the Catalyst catheter were retrieved and removed following reversal of flow arrest. Improved flow was now noted into the inferior division proximally through the two main branch points. Following each thrombectomy, strips of fragments were seen intertwined either in the retrieval device, or in the aspiration catheters. 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 It was therefore decided to proceed with placement of a rescue stent in order to prevent occlusion of the major right MCA inferior division branch. The microcatheter was again advanced over a 0.014 inch standard Synchro micro guidewire with a 6 [place] Catalyst guide catheter. The microcatheter was advanced over a micro guidewire without difficulty into the M2 region followed by  the removal of the micro guidewire. Again good aspiration obtained from the hub of the microcatheter. This was then connected to continuous heparinized saline infusion. Measurements performed of the right middle cerebral artery in the mid M1 segment, and also the proximal inferior division distal to the severe stenosis. A 4 mm x 24 mm Neuroform Atlas stent was advanced to the distal end of the microcatheter. The O ring on the delivery microcatheter was then loosened. With slight forward gentle traction with the right hand on the delivery micro guidewire with left hand the delivery microcatheter was gently retrieved unsheathing the distal and then the proximal portion of the stent with excellent coverage at the site of the severe stenosis. The delivery apparatus was removed. A control arteriogram performed through the balloon guide in the distal cervical ICA on the right demonstrated now significantly improved caliber and flow through the right middle cerebral artery the dominant inferior division. There was poor stagnant  flow noted in the proximal superior division. Free flow through the stented segment and also the other branch of the right middle cerebral artery except for the superior division. 8 mg of Integrilin was given intra-arterially in order to prevent intra stent platelet aggregation. A 10 minute post stent arteriogram continued to demonstrate flow through the stented segment in the right MCA distribution except for the non dominant superior division. A TICI 2b revascularization was maintained. The right posterior communicating artery with the right posterior cerebral artery distribution remained unchanged. Throughout the procedure, the patient's blood pressure and neurological status remained stable. A CT of the brain performed following the third pass demonstrated no mass-effect or midline shift with mild element of contrast in the right perisylvian region. The balloon guide was then retrieved and removed. The 8 French 25 cm Pinnacle sheath was then exchanged over a 0.035 inch J-tip guidewire for a short 8 [place] Pinnacle sheath. An arteriogram performed through this demonstrated spasm in the previously noted tortuous right common iliac artery. There was small amount of contrast noted in the soft tissue at the site of entry of the 8 [place] Pinnacle sheath which completely disappeared on repeat arteriogram performed approximately 3 minutes later. The 8 French sheath was removed and a 7 [place] ExoSeal closure device was placed. Also minimal compression was held for approximately 25 minutes. The distal pulses remained Dopplerable in the dorsalis pedis, and the posterior tibial regions bilaterally. Patient was left intubated on account of the patient's inability to communicate due to difficulty hearing and also her neurological condition. During this time, while pressure was held, and firmness was felt in the region of the pannus on the right side in the right lower quadrant, and also in the right inguinal region. In view  of the unstable blood pressure, the patient was sent to CT scan for CT of the pelvis and also of the abdomen and also CT of the brain. CT of the brain revealed no evidence of mass effect or midline shift with no significant change in the right perisylvian hyperattenuation felt to be due to a contrast stain. CT of the abdomen and pelvis revealed no evidence of the overlying skin tightness. Hemodynamically the patient was now stable with blood pressure in the 150s over 80s and the heart rate being regular in the 80s to 90s sinus rhythm. Patient was then transferred to the neuro ICU intubated for post thrombectomy management. IMPRESSION: Status post endovascular revascularization of occluded right middle cerebral M1 segment with 4 passes with different stent retrievers and  Penumbra aspiration achieving a TICI 2B revascularization. Status post rescue stent placement from the distal M1 segment into the proximal dominant inferior division of the right middle cerebral artery due to underlying severe intracranial arteriosclerosis. PLAN: Follow-up in the clinic 4 weeks post discharge. Electronically Signed   By: Luanne Bras M.D.   On: 12/30/2019 17:15   IR CT Head Ltd  Result Date: 12/31/2019 INDICATION: New onset left-sided hemiplegia, right gaze deviation. Occluded right middle cerebral M1 segment on CT angiogram of the head and neck. EXAM: 1. EMERGENT LARGE VESSEL OCCLUSION THROMBOLYSIS (anterior CIRCULATION) COMPARISON:  CT angiogram of the head and neck of June, 27, 2021. MEDICATIONS: Vancomycin 1 g IV was administered within 1 hour of the procedure. ANESTHESIA/SEDATION: General anesthesia CONTRAST:  Isovue 300 approximately 170 mL FLUOROSCOPY TIME:  Fluoroscopy Time: 131 minutes 12 seconds (4835 mGy). COMPLICATIONS: None immediate. TECHNIQUE: Following a full explanation of the procedure along with the potential associated complications, an informed witnessed consent was obtained the patient's son. The risks  of intracranial hemorrhage of 10%, worsening neurological deficit, ventilator dependency, death and inability to revascularize were all reviewed in detail with the patient's son. The patient was then put under general anesthesia by the Department of Anesthesiology at [facility]. The right groin was prepped and draped in the usual sterile fashion. Thereafter using modified Seldinger technique, transfemoral access into the right common femoral artery was obtained without difficulty. Over a 0.035 inch guidewire an 8 French 25 cm Pinnacle sheath was inserted. Through this, and also over a 0.035 inch guidewire a combination of a select 5 French 125 cm Simmons 2 catheter inside of a 95 cm 087 balloon guide catheter was advanced without difficulty to the right common carotid artery. The guidewire and the select catheter were removed. Good aspiration was obtained from the hub of the balloon guide catheter just proximal to the right common carotid artery bifurcation. Arteriogram was then performed centered extra cranially and intracranially. FINDINGS: The right common carotid arteriogram demonstrates the right external carotid artery and its major branches to be widely patent. The right internal carotid artery at the bulb demonstrates a smooth shallow plaque. Moderate tortuosity was noted of the proximal right internal carotid artery. 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 Distal to this the cervical petrous junction demonstrates normal opacification. Distal to this the petrous, cavernous, and the cavernous segments demonstrate patency as does the supraclinoid segment. Opacification is seen of the right posterior communicating artery opacifying the right posterior cerebral distribution.  The right middle cerebral artery demonstrates complete angiographic occlusion at the origin of the anterior temporal branch. Focal areas of caliber irregularity are seen of the proximal right middle cerebral artery. PROCEDURE: Through the 087 balloon guide catheter in the proximal right internal carotid artery, a Zoom 071 135 cm catheter inside of which was an 021 160 cm Phenom microcatheter was advanced over a 0.014 inch standard Synchro micro guidewire gingerly through the right internal carotid artery in the neck and into the supraclinoid right ICA. The micro guidewire was then gently manipulated with a torque device and advanced into the inferior division M2 M3 region of the right middle cerebral artery followed by the microcatheter. The guidewire was removed. Good aspiration was obtained from the hub of the microcatheter. Gentle contrast injection demonstrated safe position of  tip of the microcatheter. A 5 mm x 37 mm Embotrap retrieval device was then advanced to the distal end of the microcatheter. The O ring on the delivery micro guidewire was loosened. With slight forward gentle traction with the right hand on the delivery micro guidewire, with the left hand the retrieval device was deployed. The 071 Zoom catheter was then advanced into the right middle cerebral artery engaging the occluded right middle cerebral artery. With constant flow arrest in the right internal carotid artery, and constant aspiration using a Penumbra device at the hub of the 071 Zoom catheter for approximately 3 minutes, the combination of the retrieval device, the microcatheter, and the Zoom aspiration catheter were retrieved and removed. Following reversal of flow arrest, a control arteriogram performed through the balloon guide catheter in the right internal carotid artery demonstrates revascularization of the right middle cerebral artery and also of the superior and inferior divisions. The anterior temporal branch remains patent. A  TICI 2B revascularization was achieved. This also unmasked an irregular filling defect at the right middle cerebral artery terminus at the trifurcation region with flow noted in the distal inferior division. A second pass was then made again with the combination of the 071 Zoom catheter advanced over an 021 160 cm Phenom microcatheter over a 0.014 inch standard Synchro micro guidewire. Again access was obtained into the distal M2 M3 region of the inferior division with the micro guidewire then followed by the microcatheter. The guidewire was removed. Again safe position of the tip of the microcatheter was confirmed and connected to continuous heparinized saline infusion. A 4 mm x 40 mm Solitaire X retrieval device was then deployed in the manner described above. Again with proximal flow arrest in the right internal carotid artery proximally, and constant aspiration via the Zoom catheter which was now imbedded in the occluded right middle cerebral artery at its trifurcation region over approximately 2-1/2 minutes, the combination of the retrieval device, the microcatheter, and the Zoom catheter was retrieved and removed. Following reversal of flow arrest, a control arteriogram performed through the right internal carotid artery demonstrated improved opacification of the right MCA trifurcation branches. There continued be occluded superior division. The combination of the microcatheter, inside a 071 Zoom aspiration catheter was again advanced to the right middle cerebral artery over a 0.014 inch standard Synchro micro guidewire, which was now advanced into the superior division of the right middle cerebral artery emanating at the MCA trifurcation region. The microcatheter was advanced to the M2 region over a micro guidewire. The micro guidewire was removed. Good aspiration obtained from the hub of the microcatheter. Again with flow arrest in the right internal carotid artery proximally, and constant aspiration with  the Penumbra aspiration device at the hub of the aspiration catheter following deployment of a 3 mm x 20 mm Solitaire X retrieval device, for 2 minutes, the combination of the retrieval device, the microcatheter and the Zoom catheter were retrieved and removed. The superior division continued to be occluded. The superior division was again cannulated with a microcatheter over a micro guidewire as described above. After having verified safe position of the tip of the microcatheter, in the superior division M2 region, a 4 mm x 40 mm Solitaire X retrieval device was deployed in the usual manner. Again with proximal flow arrest in the proximal right ICA, and constant aspiration at the hub of the Zoom catheter at the site of the occluded superior division in the distal right middle cerebral M1 segment  over 2 minutes, the combination of the retrieval device, the microcatheter and the retrieval device were retrieved and removed. Following reverse of flow arrest, there appeared to be modest improved flow in the proximal superior division. It was now noted the inferior division had a near occlusive clot in the proximal aspect. A fourth pass was then made this time using a combination of the Phenom microcatheter, inside of a 6 [place] Catalyst 135 cm catheter advanced over a 0.014 inch standard Synchro micro guidewire to the distal right M1 segment. The micro guidewire was then advanced into the M2 M3 region of the inferior division followed by the microcatheter. The guidewire was removed. A 4 mm x 40 mm Solitaire X retrieval device was deployed with the Catalyst guide catheter advanced at the origin of inferior division and distal to this. Following proximal flow arrest and constant aspiration for approximately 2 minutes at the hub of the Catalyst guide catheter, the combination of the retrieval device, the microcatheter and the Catalyst catheter were retrieved and removed following reversal of flow arrest. Improved flow was  now noted into the inferior division proximally through the two main branch points. Following each thrombectomy, strips of fragments were seen intertwined either in the retrieval device, or in the aspiration catheters. 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 It was therefore decided to proceed with placement of a rescue stent in order to prevent occlusion of the major right MCA inferior division branch. The microcatheter was again advanced over a 0.014 inch standard Synchro micro guidewire with a 6 [place] Catalyst guide catheter. The microcatheter was advanced over a micro guidewire without difficulty into the M2 region followed by the removal of the micro guidewire. Again good aspiration obtained from the hub of the microcatheter. This was then connected to continuous heparinized saline infusion. Measurements performed of the right middle cerebral artery in the mid M1 segment, and also the proximal inferior division distal to the severe stenosis. A 4 mm x 24 mm Neuroform Atlas stent was advanced to the distal end of the microcatheter. The O ring on the delivery microcatheter was then loosened. With slight forward gentle traction with the right hand on the delivery micro guidewire with left hand the delivery microcatheter was gently retrieved unsheathing the distal and then the proximal portion of the stent with excellent coverage at the site of the severe stenosis. The delivery apparatus was removed. A control arteriogram performed through the balloon guide in the distal cervical ICA on the right demonstrated now significantly improved caliber and flow through the right middle cerebral artery the dominant inferior division. There was poor stagnant flow noted in the proximal superior division. Free flow through the stented segment and also the other branch of the right middle cerebral artery except for the superior division. 8 mg of  Integrilin was given intra-arterially in order to prevent intra stent platelet aggregation. A 10 minute post stent arteriogram continued to demonstrate flow through the stented segment in the right MCA distribution except for the non dominant superior division. A TICI 2b revascularization was maintained. The right posterior communicating artery with the right posterior cerebral artery distribution remained unchanged. Throughout the procedure, the patient's blood pressure and neurological status remained stable. A CT of the brain performed following the third pass demonstrated no mass-effect or midline shift with mild element of contrast in the right perisylvian region. The balloon guide was then retrieved and removed. The 8 French 25 cm  Pinnacle sheath was then exchanged over a 0.035 inch J-tip guidewire for a short 8 [place] Pinnacle sheath. An arteriogram performed through this demonstrated spasm in the previously noted tortuous right common iliac artery. There was small amount of contrast noted in the soft tissue at the site of entry of the 8 [place] Pinnacle sheath which completely disappeared on repeat arteriogram performed approximately 3 minutes later. The 8 French sheath was removed and a 7 [place] ExoSeal closure device was placed. Also minimal compression was held for approximately 25 minutes. The distal pulses remained Dopplerable in the dorsalis pedis, and the posterior tibial regions bilaterally. Patient was left intubated on account of the patient's inability to communicate due to difficulty hearing and also her neurological condition. During this time, while pressure was held, and firmness was felt in the region of the pannus on the right side in the right lower quadrant, and also in the right inguinal region. In view of the unstable blood pressure, the patient was sent to CT scan for CT of the pelvis and also of the abdomen and also CT of the brain. CT of the brain revealed no evidence of mass effect or  midline shift with no significant change in the right perisylvian hyperattenuation felt to be due to a contrast stain. CT of the abdomen and pelvis revealed no evidence of the overlying skin tightness. Hemodynamically the patient was now stable with blood pressure in the 150s over 80s and the heart rate being regular in the 80s to 90s sinus rhythm. Patient was then transferred to the neuro ICU intubated for post thrombectomy management. IMPRESSION: Status post endovascular revascularization of occluded right middle cerebral M1 segment with 4 passes with different stent retrievers and Penumbra aspiration achieving a TICI 2B revascularization. Status post rescue stent placement from the distal M1 segment into the proximal dominant inferior division of the right middle cerebral artery due to underlying severe intracranial arteriosclerosis. PLAN: Follow-up in the clinic 4 weeks post discharge. Electronically Signed   By: Luanne Bras M.D.   On: 12/30/2019 17:15   DG CHEST PORT 1 VIEW  Result Date: 12/31/2019 CLINICAL DATA:  Status post extubation EXAM: PORTABLE CHEST 1 VIEW COMPARISON:  12/30/2019 FINDINGS: Cardiac shadow remains enlarged. Aortic calcifications are again seen. Feeding catheter is noted extending into the stomach. Endotracheal tube has been removed in the interval. The lungs are well aerated without focal infiltrate or sizable effusion. IMPRESSION: Status post extubation. No acute abnormality noted. Electronically Signed   By: Inez Catalina M.D.   On: 12/31/2019 11:27   DG Chest Port 1 View  Result Date: 12/30/2019 CLINICAL DATA:  Intubation. EXAM: PORTABLE CHEST 1 VIEW COMPARISON:  Radiograph 11/08/2019. FINDINGS: Low positioning of the endotracheal tube 6 mm from the carina. Enteric tube in place with tip below the diaphragm not included in this chest field of view. Stable cardiomegaly. Unchanged mediastinal contours. Interstitial coarsening which appears chronic. No focal airspace  disease, pleural effusion, or pneumothorax. No acute osseous abnormalities are seen. IMPRESSION: 1. Low positioning of the endotracheal tube 6 mm from the carina. Retraction of 2-3 cm recommended. 2. Enteric tube in place with tip below the diaphragm. 3. Stable cardiomegaly and chronic interstitial coarsening. Electronically Signed   By: Keith Rake M.D.   On: 12/30/2019 01:11   DG Abd Portable 1V  Result Date: 12/30/2019 CLINICAL DATA:  OG tube placement. EXAM: PORTABLE ABDOMEN - 1 VIEW COMPARISON:  Abdomen pelvis CT yesterday. FINDINGS: Tip and side port of the enteric  tube below the diaphragm in the stomach. Excreted IV contrast within both renal collecting systems and urinary bladder. Bilateral ureteral stents in place. Nephrograms demonstrate mild hydronephrosis. Nonobstructive bowel gas pattern. IMPRESSION: 1. Tip and side port of the enteric tube below the diaphragm in the stomach. 2. Bilateral ureteral stents in place. Excreted IV contrast in both renal collecting systems in the urinary bladder with mild bilateral hydronephrosis. Electronically Signed   By: Keith Rake M.D.   On: 12/30/2019 01:13   DG C-Arm 1-60 Min-No Report  Result Date: 12/25/2019 Fluoroscopy was utilized by the requesting physician.  No radiographic interpretation.   DG C-Arm 1-60 Min-No Report  Result Date: 12/06/2019 Fluoroscopy was utilized by the requesting physician.  No radiographic interpretation.   VAS [place] GROIN PSEUDOANEURYSM  Result Date: 12/31/2019  ARTERIAL PSEUDOANEURYSM  Exam: Right groin Indications: Patient complains of bruising. Comparison Study: no prior Performing Technologist: Abram Sander RVS  Examination Guidelines: A complete evaluation includes B-mode imaging, spectral Doppler, color Doppler, and power Doppler as needed of all accessible portions of each vessel. Bilateral testing is considered an integral part of a complete examination. Limited examinations for reoccurring indications may  be performed as noted. +------------+----------+--------+------+----------+ Right DuplexPSV (cm/s)WaveformPlaqueComment(s) +------------+----------+--------+------+----------+ CFA            147    biphasic                 +------------+----------+--------+------+----------+ Prox SFA       146    biphasic                 +------------+----------+--------+------+----------+  Summary: No evidence of pseudoaneurysm, AVF or DVT  Diagnosing physician: Deitra Mayo MD Electronically signed by Deitra Mayo MD on 12/31/2019 at 8:54:10 AM.   --------------------------------------------------------------------------------    Final    IR PERCUTANEOUS ART THROMBECTOMY/INFUSION INTRACRANIAL INC DIAG ANGIO  Result Date: 12/31/2019 INDICATION: New onset left-sided hemiplegia, right gaze deviation. Occluded right middle cerebral M1 segment on CT angiogram of the head and neck. EXAM: 1. EMERGENT LARGE VESSEL OCCLUSION THROMBOLYSIS (anterior CIRCULATION) COMPARISON:  CT angiogram of the head and neck of June, 27, 2021. MEDICATIONS: Vancomycin 1 g IV was administered within 1 hour of the procedure. ANESTHESIA/SEDATION: General anesthesia CONTRAST:  Isovue 300 approximately 170 mL FLUOROSCOPY TIME:  Fluoroscopy Time: 131 minutes 12 seconds (4835 mGy). COMPLICATIONS: None immediate. TECHNIQUE: Following a full explanation of the procedure along with the potential associated complications, an informed witnessed consent was obtained the patient's son. The risks of intracranial hemorrhage of 10%, worsening neurological deficit, ventilator dependency, death and inability to revascularize were all reviewed in detail with the patient's son. The patient was then put under general anesthesia by the Department of Anesthesiology at [facility]. The right groin was prepped and draped in the usual sterile fashion. Thereafter using modified Seldinger technique, transfemoral access into the right common  femoral artery was obtained without difficulty. Over a 0.035 inch guidewire an 8 French 25 cm Pinnacle sheath was inserted. Through this, and also over a 0.035 inch guidewire a combination of a select 5 French 125 cm Simmons 2 catheter inside of a 95 cm 087 balloon guide catheter was advanced without difficulty to the right common carotid artery. The guidewire and the select catheter were removed. Good aspiration was obtained from the hub of the balloon guide catheter just proximal to the right common carotid artery bifurcation. Arteriogram was then performed centered extra cranially and intracranially. FINDINGS: The right common carotid arteriogram demonstrates the right  external carotid artery and its major branches to be widely patent. The right internal carotid artery at the bulb demonstrates a smooth shallow plaque. Moderate tortuosity was noted of the proximal right internal carotid artery. 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 Distal to this the cervical petrous junction demonstrates normal opacification. Distal to this the petrous, cavernous, and the cavernous segments demonstrate patency as does the supraclinoid segment. Opacification is seen of the right posterior communicating artery opacifying the right posterior cerebral distribution. The right middle cerebral artery demonstrates complete angiographic occlusion at the origin of the anterior temporal branch. Focal areas of caliber irregularity are seen of the proximal right middle cerebral artery. PROCEDURE: Through the 087 balloon guide catheter in the proximal right internal carotid artery, a Zoom 071 135 cm catheter inside of which was an 021 160 cm Phenom microcatheter was advanced over a 0.014 inch standard Synchro micro  guidewire gingerly through the right internal carotid artery in the neck and into the supraclinoid right ICA. The micro guidewire was then gently manipulated with a torque device and advanced into the inferior division M2 M3 region of the right middle cerebral artery followed by the microcatheter. The guidewire was removed. Good aspiration was obtained from the hub of the microcatheter. Gentle contrast injection demonstrated safe position of tip of the microcatheter. A 5 mm x 37 mm Embotrap retrieval device was then advanced to the distal end of the microcatheter. The O ring on the delivery micro guidewire was loosened. With slight forward gentle traction with the right hand on the delivery micro guidewire, with the left hand the retrieval device was deployed. The 071 Zoom catheter was then advanced into the right middle cerebral artery engaging the occluded right middle cerebral artery. With constant flow arrest in the right internal carotid artery, and constant aspiration using a Penumbra device at the hub of the 071 Zoom catheter for approximately 3 minutes, the combination of the retrieval device, the microcatheter, and the Zoom aspiration catheter were retrieved and removed. Following reversal of flow arrest, a control arteriogram performed through the balloon guide catheter in the right internal carotid artery demonstrates revascularization of the right middle cerebral artery and also of the superior and inferior divisions. The anterior temporal branch remains patent. A TICI 2B revascularization was achieved. This also unmasked an irregular filling defect at the right middle cerebral artery terminus at the trifurcation region with flow noted in the distal inferior division. A second pass was then made again with the combination of the 071 Zoom catheter advanced over an 021 160 cm Phenom microcatheter over a 0.014 inch standard Synchro micro guidewire. Again access was obtained into the distal M2 M3 region of  the inferior division with the micro guidewire then followed by the microcatheter. The guidewire was removed. Again safe position of the tip of the microcatheter was confirmed and connected to continuous heparinized saline infusion. A 4 mm x 40 mm Solitaire X retrieval device was then deployed in the manner described above. Again with proximal flow arrest in the right internal carotid artery proximally, and constant aspiration via the Zoom catheter which was now imbedded in the occluded right middle cerebral artery at its trifurcation region over approximately 2-1/2 minutes, the combination of the retrieval device, the microcatheter, and the Zoom catheter was retrieved and removed. Following  reversal of flow arrest, a control arteriogram performed through the right internal carotid artery demonstrated improved opacification of the right MCA trifurcation branches. There continued be occluded superior division. The combination of the microcatheter, inside a 071 Zoom aspiration catheter was again advanced to the right middle cerebral artery over a 0.014 inch standard Synchro micro guidewire, which was now advanced into the superior division of the right middle cerebral artery emanating at the MCA trifurcation region. The microcatheter was advanced to the M2 region over a micro guidewire. The micro guidewire was removed. Good aspiration obtained from the hub of the microcatheter. Again with flow arrest in the right internal carotid artery proximally, and constant aspiration with the Penumbra aspiration device at the hub of the aspiration catheter following deployment of a 3 mm x 20 mm Solitaire X retrieval device, for 2 minutes, the combination of the retrieval device, the microcatheter and the Zoom catheter were retrieved and removed. The superior division continued to be occluded. The superior division was again cannulated with a microcatheter over a micro guidewire as described above. After having verified safe  position of the tip of the microcatheter, in the superior division M2 region, a 4 mm x 40 mm Solitaire X retrieval device was deployed in the usual manner. Again with proximal flow arrest in the proximal right ICA, and constant aspiration at the hub of the Zoom catheter at the site of the occluded superior division in the distal right middle cerebral M1 segment over 2 minutes, the combination of the retrieval device, the microcatheter and the retrieval device were retrieved and removed. Following reverse of flow arrest, there appeared to be modest improved flow in the proximal superior division. It was now noted the inferior division had a near occlusive clot in the proximal aspect. A fourth pass was then made this time using a combination of the Phenom microcatheter, inside of a 6 [place] Catalyst 135 cm catheter advanced over a 0.014 inch standard Synchro micro guidewire to the distal right M1 segment. The micro guidewire was then advanced into the M2 M3 region of the inferior division followed by the microcatheter. The guidewire was removed. A 4 mm x 40 mm Solitaire X retrieval device was deployed with the Catalyst guide catheter advanced at the origin of inferior division and distal to this. Following proximal flow arrest and constant aspiration for approximately 2 minutes at the hub of the Catalyst guide catheter, the combination of the retrieval device, the microcatheter and the Catalyst catheter were retrieved and removed following reversal of flow arrest. Improved flow was now noted into the inferior division proximally through the two main branch points. Following each thrombectomy, strips of fragments were seen intertwined either in the retrieval device, or in the aspiration catheters. 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 It was therefore decided to proceed with placement of a rescue stent in order to prevent occlusion of the  major right MCA inferior division branch. The microcatheter was again advanced over a 0.014 inch standard Synchro micro guidewire with a 6 [place] Catalyst guide catheter. The microcatheter was advanced over a micro guidewire without difficulty into the M2 region followed by the removal of the micro guidewire. Again good aspiration obtained from the hub of the microcatheter. This was then connected to continuous heparinized saline infusion. Measurements performed of the right middle cerebral artery in the mid M1 segment, and also the proximal inferior division distal to the severe stenosis. A 4  mm x 24 mm Neuroform Atlas stent was advanced to the distal end of the microcatheter. The O ring on the delivery microcatheter was then loosened. With slight forward gentle traction with the right hand on the delivery micro guidewire with left hand the delivery microcatheter was gently retrieved unsheathing the distal and then the proximal portion of the stent with excellent coverage at the site of the severe stenosis. The delivery apparatus was removed. A control arteriogram performed through the balloon guide in the distal cervical ICA on the right demonstrated now significantly improved caliber and flow through the right middle cerebral artery the dominant inferior division. There was poor stagnant flow noted in the proximal superior division. Free flow through the stented segment and also the other branch of the right middle cerebral artery except for the superior division. 8 mg of Integrilin was given intra-arterially in order to prevent intra stent platelet aggregation. A 10 minute post stent arteriogram continued to demonstrate flow through the stented segment in the right MCA distribution except for the non dominant superior division. A TICI 2b revascularization was maintained. The right posterior communicating artery with the right posterior cerebral artery distribution remained unchanged. Throughout the procedure,  the patient's blood pressure and neurological status remained stable. A CT of the brain performed following the third pass demonstrated no mass-effect or midline shift with mild element of contrast in the right perisylvian region. The balloon guide was then retrieved and removed. The 8 French 25 cm Pinnacle sheath was then exchanged over a 0.035 inch J-tip guidewire for a short 8 [place] Pinnacle sheath. An arteriogram performed through this demonstrated spasm in the previously noted tortuous right common iliac artery. There was small amount of contrast noted in the soft tissue at the site of entry of the 8 [place] Pinnacle sheath which completely disappeared on repeat arteriogram performed approximately 3 minutes later. The 8 French sheath was removed and a 7 [place] ExoSeal closure device was placed. Also minimal compression was held for approximately 25 minutes. The distal pulses remained Dopplerable in the dorsalis pedis, and the posterior tibial regions bilaterally. Patient was left intubated on account of the patient's inability to communicate due to difficulty hearing and also her neurological condition. During this time, while pressure was held, and firmness was felt in the region of the pannus on the right side in the right lower quadrant, and also in the right inguinal region. In view of the unstable blood pressure, the patient was sent to CT scan for CT of the pelvis and also of the abdomen and also CT of the brain. CT of the brain revealed no evidence of mass effect or midline shift with no significant change in the right perisylvian hyperattenuation felt to be due to a contrast stain. CT of the abdomen and pelvis revealed no evidence of the overlying skin tightness. Hemodynamically the patient was now stable with blood pressure in the 150s over 80s and the heart rate being regular in the 80s to 90s sinus rhythm. Patient was then transferred to the neuro ICU intubated for post thrombectomy management.  IMPRESSION: Status post endovascular revascularization of occluded right middle cerebral M1 segment with 4 passes with different stent retrievers and Penumbra aspiration achieving a TICI 2B revascularization. Status post rescue stent placement from the distal M1 segment into the proximal dominant inferior division of the right middle cerebral artery due to underlying severe intracranial arteriosclerosis. PLAN: Follow-up in the clinic 4 weeks post discharge. Electronically Signed   By: Willaim Rayas  Deveshwar M.D.   On: 12/30/2019 17:15   CT HEAD CODE STROKE WO CONTRAST  Result Date: 12/29/2019 CLINICAL DATA:  Code stroke.  Left-sided weakness EXAM: CT HEAD WITHOUT CONTRAST TECHNIQUE: Contiguous axial images were obtained from the base of the skull through the vertex without intravenous contrast. COMPARISON:  None. FINDINGS: Brain: There is no acute intracranial hemorrhage or mass effect. Gray-white differentiation is preserved. Ventricles and sulci are within normal limits in size and configuration. Patchy hypoattenuation in the supratentorial white matter is nonspecific but probably reflects chronic microvascular ischemic changes. Extra-axial dural-based hyperdense lesion along the high left frontal convexity and falx measuring 2.6 x 1.8 x 2.3 cm is consistent with a meningioma. There is no apparent underlying edema. This abuts the superior sagittal sinus. Vascular: No hyperdense vessel. Mild intracranial atherosclerotic calcification at the skull base. Skull: Unremarkable. Sinuses/Orbits: No acute abnormality. Other: Mastoid air cells are clear. ASPECTS ([place] Stroke Program Early CT Score) - Ganglionic level infarction (caudate, lentiform nuclei, internal capsule, insula, M1-M3 cortex): 7 - Supraganglionic infarction (M4-M6 cortex): 3 Total score (0-10 with 10 being normal): 10 IMPRESSION: No acute intracranial hemorrhage or evidence of acute infarction. ASPECT score is 10. Chronic microvascular ischemic  changes. Left frontal meningioma without significant mass effect or underlying edema. Initial results were communicated to Dr. Lorraine Lax at [place] 6/27/2021by text page via the [facility] messaging system. Electronically Signed   By: Macy Mis M.D.   On: 12/29/2019 18:20   ECHOCARDIOGRAM LIMITED  Result Date: 12/30/2019    ECHOCARDIOGRAM LIMITED REPORT   Patient Name:   Gloria [name] Date of Exam: 12/30/2019 Medical Rec #:  [id number]           Height:       62.0 in Accession #:    [id number]          Weight:       211.0 lb Date of Birth:  17-Dec-1945            BSA:          1.956 m Patient Age:    74 years            BP:           132/80 mmHg Patient Gender: F                   HR:           64 bpm. Exam Location:  Inpatient Procedure: Limited Color Doppler, Cardiac Doppler and Limited Echo Indications:    stroke 434.91  History:        Patient has prior history of Echocardiogram examinations, most                 recent 11/09/2019. Arrythmias:Atrial Fibrillation.  Sonographer:    Johny Chess Referring Phys: [id number] [place]  1. Left ventricular ejection fraction, by estimation, is >75%. The left ventricle has hyperdynamic function. The left ventricle has no regional wall motion abnormalities. There is severe left ventricular hypertrophy. Left ventricular diastolic parameters are indeterminate.  2. Right ventricular systolic function is normal. The right ventricular size is normal. There is mildly elevated pulmonary artery systolic pressure.  3. Tricuspid valve regurgitation is mild to moderate.  4. The aortic valve is abnormal. Aortic valve regurgitation is mild to moderate. Mild aortic valve sclerosis is present, with no evidence of aortic valve stenosis.  5. The inferior vena cava is normal in size with greater than 50% respiratory variability, suggesting  right atrial pressure of 3 mmHg. FINDINGS  Left Ventricle: Left ventricular ejection fraction, by estimation, is >75%. The left  ventricle has hyperdynamic function. The left ventricle has no regional wall motion abnormalities. The left ventricular internal cavity size was small. There is severe left ventricular hypertrophy. Right Ventricle: The right ventricular size is normal. No increase in right ventricular wall thickness. Right ventricular systolic function is normal. There is mildly elevated pulmonary artery systolic pressure. The tricuspid regurgitant velocity is 3.14  m/s, and with an assumed right atrial pressure of 3 mmHg, the estimated right ventricular systolic pressure is 92.1 mmHg. Pericardium: Trivial pericardial effusion is present. Mitral Valve: The mitral valve is abnormal. There is mild thickening of the mitral valve leaflet(s). Moderate mitral annular calcification. Tricuspid Valve: The tricuspid valve is grossly normal. Tricuspid valve regurgitation is mild to moderate. Aortic Valve: The aortic valve is abnormal. Aortic valve regurgitation is mild to moderate. Mild aortic valve sclerosis is present, with no evidence of aortic valve stenosis. Pulmonic Valve: The pulmonic valve was normal in structure. Pulmonic valve regurgitation is not visualized. Aorta: The aortic root and ascending aorta are structurally normal, with no evidence of dilitation. Venous: The inferior vena cava is normal in size with greater than 50% respiratory variability, suggesting right atrial pressure of 3 mmHg.  LEFT VENTRICLE PLAX 2D LVIDd:         3.10 cm LVIDs:         1.60 cm LV PW:         1.70 cm LV IVS:        1.60 cm LVOT diam:     1.90 cm LVOT Area:     2.84 cm  IVC IVC diam: 1.90 cm LEFT ATRIUM         Index LA diam:    4.70 cm 2.40 cm/m   AORTA Ao Root diam: 3.50 cm Ao Asc diam:  3.50 cm TRICUSPID VALVE TR Peak grad:   39.4 mmHg TR Vmax:        314.00 cm/s  SHUNTS Systemic Diam: 1.90 cm Dorris Carnes MD Electronically signed by Dorris Carnes MD Signature Date/Time: 12/30/2019/7:38:12 PM    Final     PHYSICAL EXAM     Temp:  [98.1 F (36.7  C)-99.3 F (37.4 C)] 98.8 F (37.1 C) (06/30 0900) Pulse Rate:  [57-84] 59 (06/30 0900) Resp:  [16-29] 16 (06/30 0900) BP: (110-158)/(49-81) 132/55 (06/30 0900) SpO2:  [96 %-100 %] 100 % (06/30 0900) Weight:  [96.6 kg] 96.6 kg (06/30 0500)  General - Well nourished, well developed, lethargic.  Ophthalmologic - fundi not visualized due to noncooperation.  Cardiovascular - irregularly irregular heart rate and rhythm.  Neuro - extubated yesterday, tolerating well, lethargic, eyes closed initially, but easily open on touch and remained open, but not following commands due to deafness. With eye opening, eyes right forced gaze,  blinking to visual threat on the right but not on the left, not tracking, PERRL. Left facial droop.  Tongue protrusion not cooperative. Purposeful movement of RUE against gravity, at least 4/5. On pain stimulation, localizing to pain with RUE, withdraw 2/5 RLE, no movement of LUE, and slight withdraw LLE. DTR diminished on the left and left positive babinski. Sensation, coordination and gait not tested.  ASSESSMENT/PLAN Ms. ASHMI BLAS is a 74 y.o. female with history of bilateral ureteral calculi, HTN, Atrial Fibrillation not on anticoagulation (not on [facility] due to high risk of falls), who presents with left sided hemiplegia and right  gaze preference. CTH in the ED revealed left frontal meningioma, CTA with a right M1 occlusion. She received IVTPA on 12/29/19 at 1830 w/LKW 1645 and was taken for mechanical thrombectomy with resulting TICI 2B revascularization w/placement of recue stent in the right MCA.  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 Small vessel disease. Atrophy. ASPECTS 10.     CTA head & neck R M1 occlusion, fetal PCA on the right  Cerebral angio R M1 occlusion w/p TICI 2b revascularization. 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 Severe FMD R ICA.    Post IR CT hyperdensity R subarachnoid space, stable L parafalcine meningioma  MRI  R occipital infarct. R insular and frontal operculum infarct. Small scattered R frontal and parietal infarcts.  MRA  fetal R PCA. Missing superior division R MCA. R MCA stent, inferior division with flow.  LE Doppler  No DVT  2D Echo EF > 75%, severe LVH. Mild to moderate TVR.  LDL 90  HgbA1c 5.2  Heparin subq for VTE prophylaxis  aspirin 325 mg daily prior to admission, now on aspirin 81 mg daily and Brilinta (ticagrelor) 90 mg bid given stent placement. Plant to continue Brilinta at d/c with [facility] if anemia is stable  Therapy recommendations:  CIR  Disposition:  pending   Acute Respiratory Failure  intubated for IR, left intubated for airway protection  Extubation 6/29  Tolerated well  Hematoma R groin  pseudoanerysm w/ extravasation following IR  CT abd & pelvis R inguinal hematoma into abdominal wall. Stable B ureteral stents and B adrenal adenoma. Aortic atherosclerosis.    Hgb 9.2->8.8->8.4->7.8->7.3 - 1u PRBC   [place] right groin - no pseudoaneurysm  Hematuria  S/p B ureteral stents exchange 6/4 d/t B renal calculi  ABD CT Mild B hydronephrosis, stents in place  Hgb 9.2->8.8->8.4->7.8->7.3 - 1u PRBC   Cre 1.9->1.5->1.44->1.88->1.64  Urology consulted, appreciate help  Ureteral stents removed by urology 6/30 followed with 1 dose cipro  ongoing Cre monitor, if elevated, consider [place] abd to rule out hydro  Atrial Fibrillation  Home anticoagulation:  none d/t high fall risk  Not timing for [facility] at this time  Plan with AC at d/c if anemia stable   Hypertension  Home meds:  Coreg 6.25 bid  Stable 110-140  SBP goal now < 180  Long-term BP goal normotensive  Hyperlipidemia  Home meds:  No statin  LDL 90, goal < 70  lipitor 40 now  Continue statin at discharge  Dysphagia  Secondary to  stroke  NPO  Speech on board  Cortrak placed  On TF @ 24 and IVF @ 25   CKD stage IIIab  Cre 1.49->1.76->1.50->1.88->1.64  Could be due to contrast nephrotoxicity  On IVF and TF  Continue monitoring BMP  Other Stroke Risk Factors  Advanced age  Obesity, Body mass index is 38.59 kg/m., recommend weight loss, diet and exercise as appropriate   Other Active Problems  urinary incontinence on mirabegron PTA  [facility] day # 3  Patient continues to be critically ill for the last 24 hours, has developed worsening anemia, continues to have left hemiplegia and AKI with afib, and I ordered one unit PRBC, continue Cre monitoring, put on heparin subq for DVT prophylaxis and continued CBC and BMP monitoring. I discussed  with Dr. Lynetta Mare.   This patient is critically ill due to right MCA stroke, right MCA occlusion, status post TPA and thrombectomy, intracranial stent, hematuria A. fib, respirate failure and at significant risk of neurological worsening, death form recurrent stroke, hemorrhagic conversion, seizure, heart failure, hypotension. This patient's care requires constant monitoring of vital signs, hemodynamics, respiratory and cardiac monitoring, review of multiple databases, neurological assessment, discussion with family, other specialists and medical decision making of high complexity. I spent 35 minutes of neurocritical care time in the care of this patient. I had long discussion with brother over the phone, updated pt current condition, treatment plan and potential prognosis, and answered all the questions. Brother expressed understanding and appreciation.    Rosalin Hawking, MD PhD Stroke Neurology 01/01/2020 9:31 AM    To contact Stroke Continuity provider, please refer to [email or web address] After hours, contact General Neurology

## 2020-01-02 ENCOUNTER — Inpatient Hospital Stay (HOSPITAL_COMMUNITY): Payer: PPO

## 2020-01-02 DIAGNOSIS — M7989 Other specified soft tissue disorders: Secondary | ICD-10-CM

## 2020-01-02 LAB — BASIC METABOLIC PANEL
Anion gap: 8 (ref 5–15)
BUN: 31 mg/dL — ABNORMAL HIGH (ref 8–23)
CO2: 25 mmol/L (ref 22–32)
Calcium: 8.1 mg/dL — ABNORMAL LOW (ref 8.9–10.3)
Chloride: 108 mmol/L (ref 98–111)
Creatinine, Ser: 1.49 mg/dL — ABNORMAL HIGH (ref 0.44–1.00)
GFR calc Af Amer: 40 mL/min — ABNORMAL LOW (ref >60)
GFR calc non Af Amer: 34 mL/min — ABNORMAL LOW (ref >60)
Glucose, Bld: 119 mg/dL — ABNORMAL HIGH (ref 70–99)
Potassium: 4.2 mmol/L (ref 3.5–5.1)
Sodium: 141 mmol/L (ref 135–145)

## 2020-01-02 LAB — CBC
HCT: 27.2 % — ABNORMAL LOW (ref 36.0–46.0)
Hemoglobin: 8.6 g/dL — ABNORMAL LOW (ref 12.0–15.0)
MCH: 32.6 pg (ref 26.0–34.0)
MCHC: 31.6 g/dL (ref 30.0–36.0)
MCV: 103 fL — ABNORMAL HIGH (ref 80.0–100.0)
Platelets: 175 10*3/uL (ref 150–400)
RBC: 2.64 MIL/uL — ABNORMAL LOW (ref 3.87–5.11)
RDW: 14.3 % (ref 11.5–15.5)
WBC: 5.1 10*3/uL (ref 4.0–10.5)
nRBC: 0 % (ref 0.0–0.2)

## 2020-01-02 LAB — TYPE AND SCREEN
ABO/RH(D): A POS
Antibody Screen: NEGATIVE
Unit division: 0

## 2020-01-02 LAB — GLUCOSE, CAPILLARY
Glucose-Capillary: 123 mg/dL — ABNORMAL HIGH (ref 70–99)
Glucose-Capillary: 132 mg/dL — ABNORMAL HIGH (ref 70–99)
Glucose-Capillary: 129 mg/dL — ABNORMAL HIGH (ref 70–99)
Glucose-Capillary: 121 mg/dL — ABNORMAL HIGH (ref 70–99)
Glucose-Capillary: 112 mg/dL — ABNORMAL HIGH (ref 70–99)
Glucose-Capillary: 129 mg/dL — ABNORMAL HIGH (ref 70–99)

## 2020-01-02 LAB — BPAM RBC
Blood Product Expiration Date: 202107242359
ISSUE DATE / TIME: 202106300857
Unit Type and Rh: 6200

## 2020-01-02 NOTE — Progress Notes (Signed)
Inpatient Rehab Admissions:  Inpatient Rehab Consult received. Note pt still extremely limited in therapy.  Will follow from a distance over the weekend and reassess for progress/tolerance on Monday.   Signed: Shann Medal, PT, DPT Admissions Coordinator [phone] 01/02/20  3:33 PM

## 2020-01-02 NOTE — Progress Notes (Signed)
Physical Therapy Treatment Patient Details Name: Gloria [name] MRN: [id number] DOB: 1946/04/09 Today's Date: 01/02/2020    History of Present Illness This 74 y.o. female admitted with flaccid Lt side and Rt gaze preference.  Pt received tPA.  CTA revealed Rt M1 occlusion and she was taken for emergent thrombectomy.  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  She was extubated 12/31/2019. PMH:  Pt is very HOH - reads lips, HTN, h/o kidney stones, A-Fib; s/p multiple cystocopies with stent placement.  x-rays of L LE were negative for fx, confirmed L knee OA    PT Comments    Pt was able to tolerate lift OOB to chair and I positioned her with her feet down as this is the best way to get bil knee flexion at this time (she is resistive due to pain on R LE and resistive due to increased tone in L LE).  She was very fearful of falling and continues to have significant left neglect.  She was able to pull her trunk forward seated in the recliner chair for me to position pillows for left lateral push with mod assist.  I did use the clear mask and donned her glasses to help her be able to read my lips.    Follow Up Recommendations  CIR     Equipment Recommendations  Wheelchair (measurements PT);Wheelchair cushion (measurements PT);Hospital bed    Recommendations for Other Services Rehab consult     Precautions / Restrictions Precautions Precautions: Fall;Other (comment) Precaution Comments: Rt gaze preference with Lt neglect, essentially deaf Required Braces or Orthoses: Other Brace Other Brace: does best with glasses donned and clear facemask so she can read your lips (face mask is in the cubby outside of her room)    Mobility  Bed Mobility Overal bed mobility: Needs Assistance Bed Mobility: Rolling Rolling: Total assist         General bed  mobility comments: rolling to the left was easier than rolling to the right because when I assisted her in rolling to the right she pushed backwards against the bedrail resisting the motion.   Transfers Overall transfer level: Needs assistance               General transfer comment: Lifted OOB to chair with maxi sky as pt had yet to be up since her admission (this is her 4th day here).  Once positioned to accomodate for her right lateral push and left lateral lean, legs positioned down so that she could get some flexion in her knees (she has too much tone on the left to do it and is too painful and guarded on the right to do it.  I had to move slow and explain every step as she was fearful of falling.       Modified Rankin (Stroke Patients Only) Modified Rankin (Stroke Patients Only) Pre-Morbid Rankin Score: Slight disability Modified Rankin: Severe disability     Balance                                            Cognition Arousal/Alertness: Awake/alert Behavior During Therapy: WFL for tasks assessed/performed Overall Cognitive Status: Impaired/Different from baseline Area of Impairment: Safety/judgement;Awareness  Current Attention Level: Sustained     Safety/Judgement: Decreased awareness of deficits;Decreased awareness of safety Awareness: Intellectual   General Comments: Pt continues to have significant L neglect, cognition difficult to assess due to nearly deaf pt who reads lips.  She has no awarenss of her left sided weakness, inability to walk and states, "I just want to go home so I can get better"         General Comments General comments (skin integrity, edema, etc.): VSS throughout, BP mildly elevated systolically (low 569V)      Pertinent Vitals/Pain Pain Assessment: Faces Faces Pain Scale: Hurts whole lot Pain Location: bil knee pain Pain Descriptors / Indicators: Grimacing;Guarding Pain Intervention(s):  Limited activity within patient's tolerance;Monitored during session;Repositioned           PT Goals (current goals can now be found in the care plan section) Acute Rehab PT Goals Patient Stated Goal: to go home Progress towards PT goals: Progressing toward goals    Frequency    Min 4X/week      PT Plan Current plan remains appropriate       AM-PAC PT "6 Clicks" Mobility   Outcome Measure  Help needed turning from your back to your side while in a flat bed without using bedrails?: Total Help needed moving from lying on your back to sitting on the side of a flat bed without using bedrails?: Total Help needed moving to and from a bed to a chair (including a wheelchair)?: Total Help needed standing up from a chair using your arms (e.g., wheelchair or bedside chair)?: Total Help needed to walk in hospital room?: Total Help needed climbing 3-5 steps with a railing? : Total 6 Click Score: 6    End of Session   Activity Tolerance: Patient limited by pain Patient left: in chair;with call bell/phone within reach;with chair alarm set Nurse Communication: Mobility status;Need for lift equipment PT Visit Diagnosis: Muscle weakness (generalized) (M62.81);Difficulty in walking, not elsewhere classified (R26.2);Hemiplegia and hemiparesis Hemiplegia - Right/Left: Left Hemiplegia - dominant/non-dominant: Non-dominant Hemiplegia - caused by: Cerebral infarction     Time: 9480-1655 PT Time Calculation (min) (ACUTE ONLY): 37 min  Charges:  $Therapeutic Exercise: 23-37 mins                    Verdene Lennert, PT, DPT  Acute Rehabilitation [phone] pager #[phone] office     01/02/2020, 6:21 PM

## 2020-01-02 NOTE — Progress Notes (Signed)
  Speech Language Pathology Treatment: Dysphagia;Cognitive-Linquistic  Patient Details Name: Gloria [name] MRN: [id number] DOB: Nov 06, 1945 Today's Date: 01/02/2020 Time: 0911-0929 SLP Time Calculation (min) (ACUTE ONLY): 18 min  Assessment / Plan / Recommendation Clinical Impression  Pt is more alert and interactive today compared to evaluation on previous date. Glasses were donned with improved lip reading overall, although still needing a few written information. Given her L neglect, Mod cues were needed to focus attention to lips and/or paper. She was also more receptive to PO trials today but with signs of dysphagia noted that include: anterior spillage, multiple subswallows, and immediate cough x1. She seems to clear her oral cavity well today but does not make attempts to self-manage spillage from her L side. She is more ready for instrumental testing. Will f/u for potential completion on next date.    HPI HPI: This 74 y.o. female admitted with flaccid Lt side and Rt gaze preference.  Pt received tPA.  CTA revealed Rt M1 occlusion and she was taken for emergent thrombectomy.  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  She was extubated 12/31/2019. PMH:  Pt is very HOH - reads lips, HTN, h/o kidney stones, A-Fib; s/p multiple cystocopies with stent placement.        SLP Plan  Continue with current plan of care       Recommendations  Diet recommendations: NPO Medication Administration: Via alternative means                Oral Care Recommendations: Oral care QID Follow up Recommendations: Inpatient Rehab SLP Visit Diagnosis: Dysphagia, unspecified (R13.10) Plan: Continue with current plan of care       GO                Osie Bond., M.A. [facility] Pager [phone] Office  [phone]  01/02/2020, 9:33 AM

## 2020-01-02 NOTE — Progress Notes (Signed)
STROKE TEAM PROGRESS NOTE   SUBJECTIVE (INTERVAL HISTORY) RN at bedside. Pt neuro stable, no acute event overnight. Did not pass swallow, still on TF. Hb stable after PRBC transfusion. Repeat US no pseudoaneurysm. Found to have left leg swelling, will check XR and rule out DVT.    OBJECTIVE Temp:  [98 F (36.7 C)-99.3 F (37.4 C)] 98 F (36.7 C) (07/01 0800) Pulse Rate:  [55-78] 78 (07/01 0800) Cardiac Rhythm: Atrial fibrillation (07/01 0800) Resp:  [14-23] 21 (07/01 0800) BP: (109-154)/(50-78) 142/72 (07/01 0800) SpO2:  [96 %-100 %] 99 % (07/01 0800) Weight:  [94.3 kg] 94.3 kg (07/01 0500)  Recent Labs  Lab 01/01/20 1514 01/01/20 1954 01/01/20 2338 01/02/20 0343 01/02/20 0758  GLUCAP 117* 116* 122* 123* 132*   Recent Labs  Lab 12/29/19 1802 12/29/19 1802 12/29/19 1846 12/29/19 2300 12/29/19 2300 12/30/19 0134 12/30/19 0500 12/30/19 0500 12/31/19 0342 12/31/19 1211 12/31/19 1655 01/01/20 0615 01/02/20 0535  NA 141  --    < > 139   < > 141 141  --  141  --   --  142 141  K 5.3*  --    < > 4.4   < > 4.3 4.5  --  4.7  --   --  4.3 4.2  CL 106  --    < > 108  --   --  113*  --  112*  --   --  112* 108  CO2 24  --   --   --   --   --  22  --  19*  --   --  22 25  GLUCOSE 109*  --    < > 134*  --   --  149*  --  103*  --   --  125* 119*  BUN 32*  --    < > 26*  --   --  28*  --  33*  --   --  32* 31*  CREATININE 1.76*  --    < > 1.50*  --   --  1.44*  --  1.88*  --   --  1.64* 1.49*  CALCIUM 8.9   < >  --   --   --   --  7.9*   < > 8.0*  --   --  8.1* 8.1*  MG  --   --   --   --   --   --   --   --  1.9 1.8 1.8 1.8  --   PHOS  --   --   --   --   --   --   --   --   --  4.0 3.5 3.3  --    < > = values in this interval not displayed.   Recent Labs  Lab 12/29/19 1802  AST 17  ALT 11  ALKPHOS 82  BILITOT 0.6  PROT 6.2*  ALBUMIN 3.2*   Recent Labs  Lab 12/29/19 1802 12/29/19 1846 12/30/19 0500 12/31/19 0342 01/01/20 0615 01/01/20 1446 01/02/20 0535  WBC  5.8  --  7.8 10.4 6.0  --  5.1  NEUTROABS 3.9  --  7.3  --   --   --   --   HGB 11.8*   < > 8.4* 7.8* 7.3* 8.7* 8.6*  HCT 38.3   < > 26.6* 24.7* 23.5* 27.3* 27.2*  MCV 104.4*  --  103.1* 103.8* 104.9*  --  103.0*  PLT 225  --  200 223 157  --  175   < > = values in this interval not displayed.   No results for input(s): CKTOTAL, CKMB, CKMBINDEX, TROPONINI in the last 168 hours. No results for input(s): LABPROT, INR in the last 72 hours. No results for input(s): COLORURINE, LABSPEC, [place], GLUCOSEU, HGBUR, BILIRUBINUR, KETONESUR, PROTEINUR, UROBILINOGEN, NITRITE, LEUKOCYTESUR in the last 72 hours.  Invalid input(s): APPERANCEUR     Component Value Date/Time   CHOL 143 12/30/2019 0500   TRIG 110 01/01/2020 0615   HDL 41 12/30/2019 0500   CHOLHDL 3.5 12/30/2019 0500   VLDL 12 12/30/2019 0500   LDLCALC 90 12/30/2019 0500   Lab Results  Component Value Date   HGBA1C 5.2 12/30/2019   No results found for: LABOPIA, COCAINSCRNUR, LABBENZ, AMPHETMU, THCU, LABBARB  No results for input(s): ETH in the last 168 hours.  I have personally reviewed the radiological images below and agree with the radiology interpretations.  CT ABDOMEN PELVIS WO CONTRAST  Result Date: 12/29/2019 CLINICAL DATA:  Right inguinal hematoma, recent neuro interventional procedure, assess for retroperitoneal hematoma EXAM: CT ABDOMEN AND PELVIS WITHOUT CONTRAST TECHNIQUE: Multidetector CT imaging of the abdomen and pelvis was performed following the standard protocol without IV contrast. COMPARISON:  11/08/2019 FINDINGS: Lower chest: Hypoventilatory changes are seen within the dependent lower lobes. Heart is enlarged with trace pericardial fluid unchanged. Hepatobiliary: Gallbladder surgically absent. Unenhanced imaging the liver demonstrates no gross abnormalities. Pancreas: Unremarkable. No pancreatic ductal dilatation or surrounding inflammatory changes. Spleen: Normal in size without focal abnormality.  Adrenals/Urinary Tract: Excreted contrast is seen within the bilateral kidneys related to previous neuro interventional procedure. Bilateral renal cortical thinning is noted. Stable bilateral adrenal adenoma. Bilateral ureteral stents extend from the renal pelves into the bladder lumen. No filling defects within the bladder. Stomach/Bowel: Enteric catheter extends into the gastric lumen. No bowel obstruction or ileus. No bowel wall thickening or inflammatory change. Vascular/Lymphatic: Aortic atherosclerosis. No enlarged abdominal or pelvic lymph nodes. Reproductive: Uterus and bilateral adnexa are unremarkable. Other: Hematoma is seen within the right inguinal region extending along the inguinal crease into the pannus of the right lower quadrant abdominal wall. This measures up to 3.7 cm in thickness. There is no evidence of extension into the retroperitoneal space. At the time of the exam, extrinsic compression is being held at the right groin. There is no free fluid or free gas within the peritoneal cavity. Fat containing hernia again noted within the left lower quadrant abdominal wall. No bowel herniation. Musculoskeletal: No acute or destructive bony lesions. Reconstructed images demonstrate no additional findings. IMPRESSION: 1. Hematoma within the right inguinal region extending along the inguinal crease into the pannus of the right lower quadrant abdominal wall. No extension into the retroperitoneal space. 2. Bilateral ureteral stents as above. 3. Stable bilateral adrenal adenoma. 4. Aortic Atherosclerosis (ICD10-I70.0). Electronically Signed   By: Randa Ngo M.D.   On: 12/29/2019 23:48   CT Code Stroke CTA Head W/WO contrast  Result Date: 12/29/2019 CLINICAL DATA:  Left-sided weakness, code stroke follow-up EXAM: CT ANGIOGRAPHY HEAD AND NECK TECHNIQUE: Multidetector CT imaging of the head and neck was performed using the standard protocol during bolus administration of intravenous contrast.  Multiplanar CT image reconstructions and MIPs were obtained to evaluate the vascular anatomy. Carotid stenosis measurements (when applicable) are obtained utilizing NASCET criteria, using the distal internal carotid diameter as the denominator. CONTRAST:  9mL OMNIPAQUE IOHEXOL 350 MG/ML SOLN COMPARISON:  None. FINDINGS: CTA NECK  Aortic arch: Minimal calcified plaque along the aortic arch. Mild calcified plaque at the great vessel origins, which are patent. Right carotid system: Patent. Common carotid has a retropharyngeal course. Mild calcified plaque at the ICA origin causing minimal stenosis. Left carotid system: Patent. Common carotid has a retropharyngeal course. Mild calcified plaque at the ICA origin causing minimal stenosis. Vertebral arteries: Patent. Left vertebral artery is dominant. Suspected stenosis of the proximal right vertebral artery. Skeleton: Degenerative changes of the cervical spine. Other neck: No mass or adenopathy. Upper chest: Enlargement of the main pulmonary artery suggesting pulmonary arterial hypertension. Review of the MIP images confirms the above findings CTA HEAD Anterior circulation: Intracranial internal carotid arteries are patent with calcified plaque causing mild stenosis. Anterior cerebral arteries are patent. Left A1 ACA is dominant with diminutive or absent right A1 segment. There is occlusion of the mid right M1 MCA. There is preserved opacification of the more distal right MCA territory. Left MCA is patent. Posterior circulation: Intracranial vertebral arteries are patent. Basilar artery is patent. Posterior cerebral arteries are patent. There are posterior communicating arteries present bilaterally with fetal origin of the right PCA. Venous sinuses: Patent as allowed by contrast bolus timing. Review of the MIP images confirms the above findings IMPRESSION: Occlusion of the right M1 MCA. However, there is good filling of the more distal MCA territory. No hemodynamically  significant stenosis in the neck. These results were communicated to Dr. Lorraine Lax at [place] 6/27/2021by text page via the [facility] messaging system. Electronically Signed   By: Macy Mis M.D.   On: 12/29/2019 19:10   CT HEAD WO CONTRAST  Result Date: 12/29/2019 CLINICAL DATA:  Stroke follow-up.  Status post thrombectomy EXAM: CT HEAD WITHOUT CONTRAST TECHNIQUE: Contiguous axial images were obtained from the base of the skull through the vertex without intravenous contrast. COMPARISON:  Head CT 12/29/2019 FINDINGS: Brain: There is hyperdense material in the right subarachnoid space, overlying the frontal operculum and within the sylvian fissure. No other extra-axial collection. No midline shift or other mass effect. No intraparenchymal hemorrhage. 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 Vascular: Status post right MCA stent placement. Skull: Normal. Negative for fracture or focal lesion. Sinuses/Orbits: No acute finding. Other: None. IMPRESSION: 1. Hyperdense material in the right subarachnoid space, overlying the frontal operculum and within the Sylvian fissure. This is most likely contrast staining, but follow-up studies will be necessary to differentiate from subarachnoid hemorrhage. 2. Unchanged left parafalcine meningioma. Electronically Signed   By: Ulyses Jarred M.D.   On: 12/29/2019 23:49   CT Code Stroke CTA Neck W/WO contrast  Result Date: 12/29/2019 CLINICAL DATA:  Left-sided weakness, code stroke follow-up EXAM: CT ANGIOGRAPHY HEAD AND NECK TECHNIQUE: Multidetector CT imaging of the head and neck was performed using the standard protocol during bolus administration of intravenous contrast. Multiplanar CT image reconstructions and MIPs were obtained to evaluate the vascular anatomy. Carotid stenosis measurements (when applicable) are obtained utilizing NASCET criteria, using the distal internal carotid diameter as the denominator. CONTRAST:  60mL OMNIPAQUE IOHEXOL 350 MG/ML SOLN COMPARISON:   None. FINDINGS: CTA NECK Aortic arch: Minimal calcified plaque along the aortic arch. Mild calcified plaque at the great vessel origins, which are patent. Right carotid system: Patent. Common carotid has a retropharyngeal course. Mild calcified plaque at the ICA origin causing minimal stenosis. Left carotid system: Patent. Common carotid has a retropharyngeal course. Mild calcified plaque at the ICA origin causing minimal stenosis. Vertebral arteries: Patent. Left vertebral artery is dominant. Suspected stenosis of  the proximal right vertebral artery. Skeleton: Degenerative changes of the cervical spine. Other neck: No mass or adenopathy. Upper chest: Enlargement of the main pulmonary artery suggesting pulmonary arterial hypertension. Review of the MIP images confirms the above findings CTA HEAD Anterior circulation: Intracranial internal carotid arteries are patent with calcified plaque causing mild stenosis. Anterior cerebral arteries are patent. Left A1 ACA is dominant with diminutive or absent right A1 segment. There is occlusion of the mid right M1 MCA. There is preserved opacification of the more distal right MCA territory. Left MCA is patent. Posterior circulation: Intracranial vertebral arteries are patent. Basilar artery is patent. Posterior cerebral arteries are patent. There are posterior communicating arteries present bilaterally with fetal origin of the right PCA. Venous sinuses: Patent as allowed by contrast bolus timing. Review of the MIP images confirms the above findings IMPRESSION: Occlusion of the right M1 MCA. However, there is good filling of the more distal MCA territory. No hemodynamically significant stenosis in the neck. These results were communicated to Dr. Lorraine Lax at [place] 6/27/2021by text page via the [facility] messaging system. Electronically Signed   By: Macy Mis M.D.   On: 12/29/2019 19:10   MR ANGIO HEAD WO CONTRAST  Result Date: 12/30/2019 CLINICAL DATA:  Status post right  M1 occlusion with intervention EXAM: MRI HEAD WITHOUT CONTRAST MRA HEAD WITHOUT CONTRAST TECHNIQUE: Multiplanar, multiecho pulse sequences of the brain and surrounding structures were obtained without intravenous contrast. Angiographic images of the head were obtained using MRA technique without contrast. COMPARISON:  Multiple CT studies done yesterday. FINDINGS: MRI HEAD FINDINGS Brain: Diffusion imaging shows acute infarction affecting the right occipital cortex. Acute infarction affects the right insular region and frontal operculum. Small foci of acute infarction elsewhere in the right frontal and parietal regions. Areas of infarction show mild swelling but there is no mass effect. Small focus of susceptibility in the right sylvian fissure region may be intravascular. Few small foci of susceptibility in the right parietal region also suspected to be intravascular. No sign of parenchymal hematoma. No hydrocephalus. No extra-axial collection. Mild chronic small-vessel ischemic changes elsewhere throughout the cerebral hemispheric white matter. 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 Vascular: Stent in the right M1 region. Skull and upper cervical spine: Negative Sinuses/Orbits: Clear/normal Other: None MRA HEAD FINDINGS Both internal carotid arteries show antegrade flow through the skull base. On the left, there is supply in the left MCA territory and both anterior cerebral artery territories. Moderate atherosclerotic irregularity of the MCA branches. On the right, there is signal loss related to the right MCA stent. More distal MCA branch vessels do show supply, with absence of the superior division. Right vertebral artery terminates in PICA. Left vertebral artery supplies the basilar. 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 Left PCA arises from the basilar tip. Distal vessel atherosclerotic irregularity. Right PCA  arises from the right carotid and shows poor supply of the distal branches. 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 Fetal origin right PCA with poor flow in the distal branch vessels. Acute infarction of the right insular region and frontal operculum. Missing superior division right MCA. Small scattered other acute infarctions in the right frontal and parietal regions. Swelling but no mass effect. No intraparenchymal hematoma. Few small foci of petechial blood versus vascular susceptibility signal. Right MCA stent. Missing superior division as noted above. Inferior division shows flow. Electronically Signed   By: Nelson Chimes  M.D.   On: 12/30/2019 19:21   MR BRAIN WO CONTRAST  Result Date: 12/30/2019 CLINICAL DATA:  Status post right M1 occlusion with intervention EXAM: MRI HEAD WITHOUT CONTRAST MRA HEAD WITHOUT CONTRAST TECHNIQUE: Multiplanar, multiecho pulse sequences of the brain and surrounding structures were obtained without intravenous contrast. Angiographic images of the head were obtained using MRA technique without contrast. COMPARISON:  Multiple CT studies done yesterday. FINDINGS: MRI HEAD FINDINGS Brain: Diffusion imaging shows acute infarction affecting the right occipital cortex. Acute infarction affects the right insular region and frontal operculum. Small foci of acute infarction elsewhere in the right frontal and parietal regions. Areas of infarction show mild swelling but there is no mass effect. Small focus of susceptibility in the right sylvian fissure region may be intravascular. Few small foci of susceptibility in the right parietal region also suspected to be intravascular. No sign of parenchymal hematoma. No hydrocephalus. No extra-axial collection. Mild chronic small-vessel ischemic changes elsewhere throughout the cerebral hemispheric white matter. 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 Vascular: Stent in the right M1 region. Skull and upper cervical spine: Negative Sinuses/Orbits: Clear/normal Other: None MRA HEAD FINDINGS Both internal carotid arteries show antegrade flow through the skull base. On the left, there is supply in the left MCA territory and both anterior cerebral artery territories. Moderate atherosclerotic irregularity of the MCA branches. On the right, there is signal loss related to the right MCA stent. More distal MCA branch vessels do show supply, with absence of the superior division. Right vertebral artery terminates in PICA. Left vertebral artery supplies the basilar. 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 Left PCA arises from the basilar tip. Distal vessel atherosclerotic irregularity. Right PCA arises from the right carotid and shows poor supply of the distal branches. 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 Fetal origin right PCA with poor flow in the distal branch vessels. Acute infarction of the right insular region and frontal operculum. Missing superior division right MCA. Small scattered other acute infarctions in the right frontal and parietal regions. Swelling but no mass effect. No intraparenchymal hematoma. Few small foci of petechial blood versus vascular susceptibility signal. Right MCA stent. Missing superior division as noted above. Inferior division shows flow. Electronically Signed   By: Nelson Chimes M.D.   On: 12/30/2019 19:21   IR Intra Cran Stent  Result Date: 12/31/2019 INDICATION: New onset left-sided hemiplegia, right gaze deviation. Occluded right middle cerebral M1 segment on CT angiogram of the head and neck. EXAM: 1. EMERGENT LARGE VESSEL OCCLUSION THROMBOLYSIS (anterior CIRCULATION) COMPARISON:  CT angiogram of the head and neck of June, 27, 2021. MEDICATIONS: Vancomycin 1 g IV was administered within 1 hour of the procedure. ANESTHESIA/SEDATION: General anesthesia CONTRAST:  Isovue 300  approximately 170 mL FLUOROSCOPY TIME:  Fluoroscopy Time: 131 minutes 12 seconds (4835 mGy). COMPLICATIONS: None immediate. TECHNIQUE: Following a full explanation of the procedure along with the potential associated complications, an informed witnessed consent was obtained the patient's son. The risks of intracranial hemorrhage of 10%, worsening neurological deficit, ventilator dependency, death and inability to revascularize were all reviewed in detail with the patient's son. The patient was then put under general anesthesia by the Department of Anesthesiology at [facility]. The right groin was prepped and draped in the usual sterile fashion. Thereafter using modified Seldinger technique, transfemoral access into the right common femoral artery was obtained without difficulty. Over a 0.035 inch  guidewire an 8 French 25 cm Pinnacle sheath was inserted. Through this, and also over a 0.035 inch guidewire a combination of a select 5 French 125 cm Simmons 2 catheter inside of a 95 cm 087 balloon guide catheter was advanced without difficulty to the right common carotid artery. The guidewire and the select catheter were removed. Good aspiration was obtained from the hub of the balloon guide catheter just proximal to the right common carotid artery bifurcation. Arteriogram was then performed centered extra cranially and intracranially. FINDINGS: The right common carotid arteriogram demonstrates the right external carotid artery and its major branches to be widely patent. The right internal carotid artery at the bulb demonstrates a smooth shallow plaque. Moderate tortuosity was noted of the proximal right internal carotid artery. 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 Distal to this  the cervical petrous junction demonstrates normal opacification. Distal to this the petrous, cavernous, and the cavernous segments demonstrate patency as does the supraclinoid segment. Opacification is seen of the right posterior communicating artery opacifying the right posterior cerebral distribution. The right middle cerebral artery demonstrates complete angiographic occlusion at the origin of the anterior temporal branch. Focal areas of caliber irregularity are seen of the proximal right middle cerebral artery. PROCEDURE: Through the 087 balloon guide catheter in the proximal right internal carotid artery, a Zoom 071 135 cm catheter inside of which was an 021 160 cm Phenom microcatheter was advanced over a 0.014 inch standard Synchro micro guidewire gingerly through the right internal carotid artery in the neck and into the supraclinoid right ICA. The micro guidewire was then gently manipulated with a torque device and advanced into the inferior division M2 M3 region of the right middle cerebral artery followed by the microcatheter. The guidewire was removed. Good aspiration was obtained from the hub of the microcatheter. Gentle contrast injection demonstrated safe position of tip of the microcatheter. A 5 mm x 37 mm Embotrap retrieval device was then advanced to the distal end of the microcatheter. The O ring on the delivery micro guidewire was loosened. With slight forward gentle traction with the right hand on the delivery micro guidewire, with the left hand the retrieval device was deployed. The 071 Zoom catheter was then advanced into the right middle cerebral artery engaging the occluded right middle cerebral artery. With constant flow arrest in the right internal carotid artery, and constant aspiration using a Penumbra device at the hub of the 071 Zoom catheter for approximately 3 minutes, the combination of the retrieval device, the microcatheter, and the Zoom aspiration catheter were retrieved and  removed. Following reversal of flow arrest, a control arteriogram performed through the balloon guide catheter in the right internal carotid artery demonstrates revascularization of the right middle cerebral artery and also of the superior and inferior divisions. The anterior temporal branch remains patent. A TICI 2B revascularization was achieved. This also unmasked an irregular filling defect at the right middle cerebral artery terminus at the trifurcation region with flow noted in the distal inferior division. A second pass was then made again with the combination of the 071 Zoom catheter advanced over an 021 160 cm Phenom microcatheter over a 0.014 inch standard Synchro micro guidewire. Again access was obtained into the distal M2 M3 region of the inferior division with the micro guidewire then followed by  the microcatheter. The guidewire was removed. Again safe position of the tip of the microcatheter was confirmed and connected to continuous heparinized saline infusion. A 4 mm x 40 mm Solitaire X retrieval device was then deployed in the manner described above. Again with proximal flow arrest in the right internal carotid artery proximally, and constant aspiration via the Zoom catheter which was now imbedded in the occluded right middle cerebral artery at its trifurcation region over approximately 2-1/2 minutes, the combination of the retrieval device, the microcatheter, and the Zoom catheter was retrieved and removed. Following reversal of flow arrest, a control arteriogram performed through the right internal carotid artery demonstrated improved opacification of the right MCA trifurcation branches. There continued be occluded superior division. The combination of the microcatheter, inside a 071 Zoom aspiration catheter was again advanced to the right middle cerebral artery over a 0.014 inch standard Synchro micro guidewire, which was now advanced into the superior division of the right middle cerebral artery  emanating at the MCA trifurcation region. The microcatheter was advanced to the M2 region over a micro guidewire. The micro guidewire was removed. Good aspiration obtained from the hub of the microcatheter. Again with flow arrest in the right internal carotid artery proximally, and constant aspiration with the Penumbra aspiration device at the hub of the aspiration catheter following deployment of a 3 mm x 20 mm Solitaire X retrieval device, for 2 minutes, the combination of the retrieval device, the microcatheter and the Zoom catheter were retrieved and removed. The superior division continued to be occluded. The superior division was again cannulated with a microcatheter over a micro guidewire as described above. After having verified safe position of the tip of the microcatheter, in the superior division M2 region, a 4 mm x 40 mm Solitaire X retrieval device was deployed in the usual manner. Again with proximal flow arrest in the proximal right ICA, and constant aspiration at the hub of the Zoom catheter at the site of the occluded superior division in the distal right middle cerebral M1 segment over 2 minutes, the combination of the retrieval device, the microcatheter and the retrieval device were retrieved and removed. Following reverse of flow arrest, there appeared to be modest improved flow in the proximal superior division. It was now noted the inferior division had a near occlusive clot in the proximal aspect. A fourth pass was then made this time using a combination of the Phenom microcatheter, inside of a 6 [place] Catalyst 135 cm catheter advanced over a 0.014 inch standard Synchro micro guidewire to the distal right M1 segment. The micro guidewire was then advanced into the M2 M3 region of the inferior division followed by the microcatheter. The guidewire was removed. A 4 mm x 40 mm Solitaire X retrieval device was deployed with the Catalyst guide catheter advanced at the origin of inferior division and  distal to this. Following proximal flow arrest and constant aspiration for approximately 2 minutes at the hub of the Catalyst guide catheter, the combination of the retrieval device, the microcatheter and the Catalyst catheter were retrieved and removed following reversal of flow arrest. Improved flow was now noted into the inferior division proximally through the two main branch points. Following each thrombectomy, strips of fragments were seen intertwined either in the retrieval device, or in the aspiration catheters. 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 It was therefore decided to proceed with placement of a rescue stent in order to  prevent occlusion of the major right MCA inferior division branch. The microcatheter was again advanced over a 0.014 inch standard Synchro micro guidewire with a 6 [place] Catalyst guide catheter. The microcatheter was advanced over a micro guidewire without difficulty into the M2 region followed by the removal of the micro guidewire. Again good aspiration obtained from the hub of the microcatheter. This was then connected to continuous heparinized saline infusion. Measurements performed of the right middle cerebral artery in the mid M1 segment, and also the proximal inferior division distal to the severe stenosis. A 4 mm x 24 mm Neuroform Atlas stent was advanced to the distal end of the microcatheter. The O ring on the delivery microcatheter was then loosened. With slight forward gentle traction with the right hand on the delivery micro guidewire with left hand the delivery microcatheter was gently retrieved unsheathing the distal and then the proximal portion of the stent with excellent coverage at the site of the severe stenosis. The delivery apparatus was removed. A control arteriogram performed through the balloon guide in the distal cervical ICA on the right demonstrated now significantly improved  caliber and flow through the right middle cerebral artery the dominant inferior division. There was poor stagnant flow noted in the proximal superior division. Free flow through the stented segment and also the other branch of the right middle cerebral artery except for the superior division. 8 mg of Integrilin was given intra-arterially in order to prevent intra stent platelet aggregation. A 10 minute post stent arteriogram continued to demonstrate flow through the stented segment in the right MCA distribution except for the non dominant superior division. A TICI 2b revascularization was maintained. The right posterior communicating artery with the right posterior cerebral artery distribution remained unchanged. Throughout the procedure, the patient's blood pressure and neurological status remained stable. A CT of the brain performed following the third pass demonstrated no mass-effect or midline shift with mild element of contrast in the right perisylvian region. The balloon guide was then retrieved and removed. The 8 French 25 cm Pinnacle sheath was then exchanged over a 0.035 inch J-tip guidewire for a short 8 [place] Pinnacle sheath. An arteriogram performed through this demonstrated spasm in the previously noted tortuous right common iliac artery. There was small amount of contrast noted in the soft tissue at the site of entry of the 8 [place] Pinnacle sheath which completely disappeared on repeat arteriogram performed approximately 3 minutes later. The 8 French sheath was removed and a 7 [place] ExoSeal closure device was placed. Also minimal compression was held for approximately 25 minutes. The distal pulses remained Dopplerable in the dorsalis pedis, and the posterior tibial regions bilaterally. Patient was left intubated on account of the patient's inability to communicate due to difficulty hearing and also her neurological condition. During this time, while pressure was held, and firmness was felt in the  region of the pannus on the right side in the right lower quadrant, and also in the right inguinal region. In view of the unstable blood pressure, the patient was sent to CT scan for CT of the pelvis and also of the abdomen and also CT of the brain. CT of the brain revealed no evidence of mass effect or midline shift with no significant change in the right perisylvian hyperattenuation felt to be due to a contrast stain. CT of the abdomen and pelvis revealed no evidence of the overlying skin tightness. Hemodynamically the patient was now stable with blood pressure in the 150s over 80s  and the heart rate being regular in the 80s to 90s sinus rhythm. Patient was then transferred to the neuro ICU intubated for post thrombectomy management. IMPRESSION: Status post endovascular revascularization of occluded right middle cerebral M1 segment with 4 passes with different stent retrievers and Penumbra aspiration achieving a TICI 2B revascularization. Status post rescue stent placement from the distal M1 segment into the proximal dominant inferior division of the right middle cerebral artery due to underlying severe intracranial arteriosclerosis. PLAN: Follow-up in the clinic 4 weeks post discharge. Electronically Signed   By: Luanne Bras M.D.   On: 12/30/2019 17:15   IR CT Head Ltd  Result Date: 12/31/2019 INDICATION: New onset left-sided hemiplegia, right gaze deviation. Occluded right middle cerebral M1 segment on CT angiogram of the head and neck. EXAM: 1. EMERGENT LARGE VESSEL OCCLUSION THROMBOLYSIS (anterior CIRCULATION) COMPARISON:  CT angiogram of the head and neck of June, 27, 2021. MEDICATIONS: Vancomycin 1 g IV was administered within 1 hour of the procedure. ANESTHESIA/SEDATION: General anesthesia CONTRAST:  Isovue 300 approximately 170 mL FLUOROSCOPY TIME:  Fluoroscopy Time: 131 minutes 12 seconds (4835 mGy). COMPLICATIONS: None immediate. TECHNIQUE: Following a full explanation of the procedure along  with the potential associated complications, an informed witnessed consent was obtained the patient's son. The risks of intracranial hemorrhage of 10%, worsening neurological deficit, ventilator dependency, death and inability to revascularize were all reviewed in detail with the patient's son. The patient was then put under general anesthesia by the Department of Anesthesiology at [facility]. The right groin was prepped and draped in the usual sterile fashion. Thereafter using modified Seldinger technique, transfemoral access into the right common femoral artery was obtained without difficulty. Over a 0.035 inch guidewire an 8 French 25 cm Pinnacle sheath was inserted. Through this, and also over a 0.035 inch guidewire a combination of a select 5 French 125 cm Simmons 2 catheter inside of a 95 cm 087 balloon guide catheter was advanced without difficulty to the right common carotid artery. The guidewire and the select catheter were removed. Good aspiration was obtained from the hub of the balloon guide catheter just proximal to the right common carotid artery bifurcation. Arteriogram was then performed centered extra cranially and intracranially. FINDINGS: The right common carotid arteriogram demonstrates the right external carotid artery and its major branches to be widely patent. The right internal carotid artery at the bulb demonstrates a smooth shallow plaque. Moderate tortuosity was noted of the proximal right internal carotid artery. 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 Distal to this the cervical petrous junction demonstrates normal opacification. Distal to this the petrous, cavernous, and the cavernous segments demonstrate patency as does the supraclinoid segment.  Opacification is seen of the right posterior communicating artery opacifying the right posterior cerebral distribution. The right middle cerebral artery demonstrates complete angiographic occlusion at the origin of the anterior temporal branch. Focal areas of caliber irregularity are seen of the proximal right middle cerebral artery. PROCEDURE: Through the 087 balloon guide catheter in the proximal right internal carotid artery, a Zoom 071 135 cm catheter inside of which was an 021 160 cm Phenom microcatheter was advanced over a 0.014 inch standard Synchro micro guidewire gingerly through the right internal carotid artery in the neck and into the supraclinoid right ICA. The micro guidewire was then  gently manipulated with a torque device and advanced into the inferior division M2 M3 region of the right middle cerebral artery followed by the microcatheter. The guidewire was removed. Good aspiration was obtained from the hub of the microcatheter. Gentle contrast injection demonstrated safe position of tip of the microcatheter. A 5 mm x 37 mm Embotrap retrieval device was then advanced to the distal end of the microcatheter. The O ring on the delivery micro guidewire was loosened. With slight forward gentle traction with the right hand on the delivery micro guidewire, with the left hand the retrieval device was deployed. The 071 Zoom catheter was then advanced into the right middle cerebral artery engaging the occluded right middle cerebral artery. With constant flow arrest in the right internal carotid artery, and constant aspiration using a Penumbra device at the hub of the 071 Zoom catheter for approximately 3 minutes, the combination of the retrieval device, the microcatheter, and the Zoom aspiration catheter were retrieved and removed. Following reversal of flow arrest, a control arteriogram performed through the balloon guide catheter in the right internal carotid artery demonstrates revascularization of the  right middle cerebral artery and also of the superior and inferior divisions. The anterior temporal branch remains patent. A TICI 2B revascularization was achieved. This also unmasked an irregular filling defect at the right middle cerebral artery terminus at the trifurcation region with flow noted in the distal inferior division. A second pass was then made again with the combination of the 071 Zoom catheter advanced over an 021 160 cm Phenom microcatheter over a 0.014 inch standard Synchro micro guidewire. Again access was obtained into the distal M2 M3 region of the inferior division with the micro guidewire then followed by the microcatheter. The guidewire was removed. Again safe position of the tip of the microcatheter was confirmed and connected to continuous heparinized saline infusion. A 4 mm x 40 mm Solitaire X retrieval device was then deployed in the manner described above. Again with proximal flow arrest in the right internal carotid artery proximally, and constant aspiration via the Zoom catheter which was now imbedded in the occluded right middle cerebral artery at its trifurcation region over approximately 2-1/2 minutes, the combination of the retrieval device, the microcatheter, and the Zoom catheter was retrieved and removed. Following reversal of flow arrest, a control arteriogram performed through the right internal carotid artery demonstrated improved opacification of the right MCA trifurcation branches. There continued be occluded superior division. The combination of the microcatheter, inside a 071 Zoom aspiration catheter was again advanced to the right middle cerebral artery over a 0.014 inch standard Synchro micro guidewire, which was now advanced into the superior division of the right middle cerebral artery emanating at the MCA trifurcation region. The microcatheter was advanced to the M2 region over a micro guidewire. The micro guidewire was removed. Good aspiration obtained from the hub  of the microcatheter. Again with flow arrest in the right internal carotid artery proximally, and constant aspiration with the Penumbra aspiration device at the hub of the aspiration catheter following deployment of a 3 mm x 20 mm Solitaire X retrieval device, for 2 minutes, the combination of the retrieval device, the microcatheter and the Zoom catheter were retrieved and removed. The superior division continued to be occluded. The superior division was again cannulated with a microcatheter over a micro guidewire as described above. After having verified safe position of the tip of the microcatheter, in the superior division M2 region, a 4 mm x 40 mm  Solitaire X retrieval device was deployed in the usual manner. Again with proximal flow arrest in the proximal right ICA, and constant aspiration at the hub of the Zoom catheter at the site of the occluded superior division in the distal right middle cerebral M1 segment over 2 minutes, the combination of the retrieval device, the microcatheter and the retrieval device were retrieved and removed. Following reverse of flow arrest, there appeared to be modest improved flow in the proximal superior division. It was now noted the inferior division had a near occlusive clot in the proximal aspect. A fourth pass was then made this time using a combination of the Phenom microcatheter, inside of a 6 [place] Catalyst 135 cm catheter advanced over a 0.014 inch standard Synchro micro guidewire to the distal right M1 segment. The micro guidewire was then advanced into the M2 M3 region of the inferior division followed by the microcatheter. The guidewire was removed. A 4 mm x 40 mm Solitaire X retrieval device was deployed with the Catalyst guide catheter advanced at the origin of inferior division and distal to this. Following proximal flow arrest and constant aspiration for approximately 2 minutes at the hub of the Catalyst guide catheter, the combination of the retrieval device,  the microcatheter and the Catalyst catheter were retrieved and removed following reversal of flow arrest. Improved flow was now noted into the inferior division proximally through the two main branch points. Following each thrombectomy, strips of fragments were seen intertwined either in the retrieval device, or in the aspiration catheters. 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 It was therefore decided to proceed with placement of a rescue stent in order to prevent occlusion of the major right MCA inferior division branch. The microcatheter was again advanced over a 0.014 inch standard Synchro micro guidewire with a 6 [place] Catalyst guide catheter. The microcatheter was advanced over a micro guidewire without difficulty into the M2 region followed by the removal of the micro guidewire. Again good aspiration obtained from the hub of the microcatheter. This was then connected to continuous heparinized saline infusion. Measurements performed of the right middle cerebral artery in the mid M1 segment, and also the proximal inferior division distal to the severe stenosis. A 4 mm x 24 mm Neuroform Atlas stent was advanced to the distal end of the microcatheter. The O ring on the delivery microcatheter was then loosened. With slight forward gentle traction with the right hand on the delivery micro guidewire with left hand the delivery microcatheter was gently retrieved unsheathing the distal and then the proximal portion of the stent with excellent coverage at the site of the severe stenosis. The delivery apparatus was removed. A control arteriogram performed through the balloon guide in the distal cervical ICA on the right demonstrated now significantly improved caliber and flow through the right middle cerebral artery the dominant inferior division. There was poor stagnant flow noted in the proximal superior division. Free flow through the  stented segment and also the other branch of the right middle cerebral artery except for the superior division. 8 mg of Integrilin was given intra-arterially in order to prevent intra stent platelet aggregation. A 10 minute post stent arteriogram continued to demonstrate flow through the stented segment in the right MCA distribution except for the non dominant superior division. A TICI 2b revascularization was maintained. The right posterior communicating artery with the right posterior cerebral artery distribution remained unchanged. Throughout the procedure, the patient's  blood pressure and neurological status remained stable. A CT of the brain performed following the third pass demonstrated no mass-effect or midline shift with mild element of contrast in the right perisylvian region. The balloon guide was then retrieved and removed. The 8 French 25 cm Pinnacle sheath was then exchanged over a 0.035 inch J-tip guidewire for a short 8 [place] Pinnacle sheath. An arteriogram performed through this demonstrated spasm in the previously noted tortuous right common iliac artery. There was small amount of contrast noted in the soft tissue at the site of entry of the 8 [place] Pinnacle sheath which completely disappeared on repeat arteriogram performed approximately 3 minutes later. The 8 French sheath was removed and a 7 [place] ExoSeal closure device was placed. Also minimal compression was held for approximately 25 minutes. The distal pulses remained Dopplerable in the dorsalis pedis, and the posterior tibial regions bilaterally. Patient was left intubated on account of the patient's inability to communicate due to difficulty hearing and also her neurological condition. During this time, while pressure was held, and firmness was felt in the region of the pannus on the right side in the right lower quadrant, and also in the right inguinal region. In view of the unstable blood pressure, the patient was sent to CT scan for  CT of the pelvis and also of the abdomen and also CT of the brain. CT of the brain revealed no evidence of mass effect or midline shift with no significant change in the right perisylvian hyperattenuation felt to be due to a contrast stain. CT of the abdomen and pelvis revealed no evidence of the overlying skin tightness. Hemodynamically the patient was now stable with blood pressure in the 150s over 80s and the heart rate being regular in the 80s to 90s sinus rhythm. Patient was then transferred to the neuro ICU intubated for post thrombectomy management. IMPRESSION: Status post endovascular revascularization of occluded right middle cerebral M1 segment with 4 passes with different stent retrievers and Penumbra aspiration achieving a TICI 2B revascularization. Status post rescue stent placement from the distal M1 segment into the proximal dominant inferior division of the right middle cerebral artery due to underlying severe intracranial arteriosclerosis. PLAN: Follow-up in the clinic 4 weeks post discharge. Electronically Signed   By: Luanne Bras M.D.   On: 12/30/2019 17:15   DG CHEST PORT 1 VIEW  Result Date: 12/31/2019 CLINICAL DATA:  Status post extubation EXAM: PORTABLE CHEST 1 VIEW COMPARISON:  12/30/2019 FINDINGS: Cardiac shadow remains enlarged. Aortic calcifications are again seen. Feeding catheter is noted extending into the stomach. Endotracheal tube has been removed in the interval. The lungs are well aerated without focal infiltrate or sizable effusion. IMPRESSION: Status post extubation. No acute abnormality noted. Electronically Signed   By: Inez Catalina M.D.   On: 12/31/2019 11:27   DG Chest Port 1 View  Result Date: 12/30/2019 CLINICAL DATA:  Intubation. EXAM: PORTABLE CHEST 1 VIEW COMPARISON:  Radiograph 11/08/2019. FINDINGS: Low positioning of the endotracheal tube 6 mm from the carina. Enteric tube in place with tip below the diaphragm not included in this chest field of view.  Stable cardiomegaly. Unchanged mediastinal contours. Interstitial coarsening which appears chronic. No focal airspace disease, pleural effusion, or pneumothorax. No acute osseous abnormalities are seen. IMPRESSION: 1. Low positioning of the endotracheal tube 6 mm from the carina. Retraction of 2-3 cm recommended. 2. Enteric tube in place with tip below the diaphragm. 3. Stable cardiomegaly and chronic interstitial coarsening. Electronically Signed  By: Keith Rake M.D.   On: 12/30/2019 01:11   DG Abd Portable 1V  Result Date: 12/30/2019 CLINICAL DATA:  OG tube placement. EXAM: PORTABLE ABDOMEN - 1 VIEW COMPARISON:  Abdomen pelvis CT yesterday. FINDINGS: Tip and side port of the enteric tube below the diaphragm in the stomach. Excreted IV contrast within both renal collecting systems and urinary bladder. Bilateral ureteral stents in place. Nephrograms demonstrate mild hydronephrosis. Nonobstructive bowel gas pattern. IMPRESSION: 1. Tip and side port of the enteric tube below the diaphragm in the stomach. 2. Bilateral ureteral stents in place. Excreted IV contrast in both renal collecting systems in the urinary bladder with mild bilateral hydronephrosis. Electronically Signed   By: Keith Rake M.D.   On: 12/30/2019 01:13   DG C-Arm 1-60 Min-No Report  Result Date: 12/25/2019 Fluoroscopy was utilized by the requesting physician.  No radiographic interpretation.   DG C-Arm 1-60 Min-No Report  Result Date: 12/06/2019 Fluoroscopy was utilized by the requesting physician.  No radiographic interpretation.   VAS [place] GROIN PSEUDOANEURYSM  Result Date: 01/01/2020  ARTERIAL PSEUDOANEURYSM  Exam: Right groin Indications: Patient complains of bruising and drop in hemoglobin. History: S/p catheterization. Comparison Study: 12/30/19 negative pseudoaneurysm/ hematoma Performing Technologist: June Leap RDMS, RVT  Examination Guidelines: A complete evaluation includes B-mode imaging, spectral Doppler, color  Doppler, and power Doppler as needed of all accessible portions of each vessel. Bilateral testing is considered an integral part of a complete examination. Limited examinations for reoccurring indications may be performed as noted. +------------+----------+---------+------+----------+ Right DuplexPSV (cm/s)Waveform PlaqueComment(s) +------------+----------+---------+------+----------+ CFA            140    triphasic                 +------------+----------+---------+------+----------+ Right Vein comments:patent CFV  Findings: No hematoma visualized  Summary: No evidence of pseudoaneurysm, AVF or DVT  Diagnosing physician: Harold Barban MD Electronically signed by Harold Barban MD on 01/01/2020 at 8:52:29 PM.   --------------------------------------------------------------------------------    Final    VAS [place] GROIN PSEUDOANEURYSM  Result Date: 12/31/2019  ARTERIAL PSEUDOANEURYSM  Exam: Right groin Indications: Patient complains of bruising. Comparison Study: no prior Performing Technologist: Abram Sander RVS  Examination Guidelines: A complete evaluation includes B-mode imaging, spectral Doppler, color Doppler, and power Doppler as needed of all accessible portions of each vessel. Bilateral testing is considered an integral part of a complete examination. Limited examinations for reoccurring indications may be performed as noted. +------------+----------+--------+------+----------+ Right DuplexPSV (cm/s)WaveformPlaqueComment(s) +------------+----------+--------+------+----------+ CFA            147    biphasic                 +------------+----------+--------+------+----------+ Prox SFA       146    biphasic                 +------------+----------+--------+------+----------+  Summary: No evidence of pseudoaneurysm, AVF or DVT  Diagnosing physician: Deitra Mayo MD Electronically signed by Deitra Mayo MD on 12/31/2019 at 8:54:10 AM.    --------------------------------------------------------------------------------    Final    IR PERCUTANEOUS ART THROMBECTOMY/INFUSION INTRACRANIAL INC DIAG ANGIO  Result Date: 12/31/2019 INDICATION: New onset left-sided hemiplegia, right gaze deviation. Occluded right middle cerebral M1 segment on CT angiogram of the head and neck. EXAM: 1. EMERGENT LARGE VESSEL OCCLUSION THROMBOLYSIS (anterior CIRCULATION) COMPARISON:  CT angiogram of the head and neck of June, 27, 2021. MEDICATIONS: Vancomycin 1 g IV was administered within 1 hour of the procedure. ANESTHESIA/SEDATION: General anesthesia CONTRAST:  Isovue 300 approximately 170 mL FLUOROSCOPY TIME:  Fluoroscopy Time: 131 minutes 12 seconds (4835 mGy). COMPLICATIONS: None immediate. TECHNIQUE: Following a full explanation of the procedure along with the potential associated complications, an informed witnessed consent was obtained the patient's son. The risks of intracranial hemorrhage of 10%, worsening neurological deficit, ventilator dependency, death and inability to revascularize were all reviewed in detail with the patient's son. The patient was then put under general anesthesia by the Department of Anesthesiology at [facility]. The right groin was prepped and draped in the usual sterile fashion. Thereafter using modified Seldinger technique, transfemoral access into the right common femoral artery was obtained without difficulty. Over a 0.035 inch guidewire an 8 French 25 cm Pinnacle sheath was inserted. Through this, and also over a 0.035 inch guidewire a combination of a select 5 French 125 cm Simmons 2 catheter inside of a 95 cm 087 balloon guide catheter was advanced without difficulty to the right common carotid artery. The guidewire and the select catheter were removed. Good aspiration was obtained from the hub of the balloon guide catheter just proximal to the right common carotid artery bifurcation. Arteriogram was then performed centered  extra cranially and intracranially. FINDINGS: The right common carotid arteriogram demonstrates the right external carotid artery and its major branches to be widely patent. The right internal carotid artery at the bulb demonstrates a smooth shallow plaque. Moderate tortuosity was noted of the proximal right internal carotid artery. 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 Distal to this the cervical petrous junction demonstrates normal opacification. Distal to this the petrous, cavernous, and the cavernous segments demonstrate patency as does the supraclinoid segment. Opacification is seen of the right posterior communicating artery opacifying the right posterior cerebral distribution. The right middle cerebral artery demonstrates complete angiographic occlusion at the origin of the anterior temporal branch. Focal areas of caliber irregularity are seen of the proximal right middle cerebral artery. PROCEDURE: Through the 087 balloon guide catheter in the proximal right internal carotid artery, a Zoom 071 135 cm catheter inside of which was an 021 160 cm Phenom microcatheter was advanced over a 0.014 inch standard Synchro micro guidewire gingerly through the right internal carotid artery in the neck and into the supraclinoid right ICA. The micro guidewire was then gently manipulated with a torque device and advanced into the inferior division M2 M3 region of the right middle cerebral artery followed by the microcatheter. The guidewire was removed. Good aspiration was obtained from the hub of the microcatheter. Gentle contrast injection demonstrated safe position of tip of the microcatheter. A 5 mm x 37 mm Embotrap retrieval device was then advanced to the distal end of the microcatheter. The  O ring on the delivery micro guidewire was loosened. With slight forward gentle traction with the right hand on the delivery micro guidewire, with the left hand the retrieval device was deployed. The 071 Zoom catheter was then advanced into the right middle cerebral artery engaging the occluded right middle cerebral artery. With constant flow arrest in the right internal carotid artery, and constant aspiration using a Penumbra device at the hub of the 071 Zoom catheter for approximately 3 minutes, the combination of the retrieval device, the microcatheter, and the Zoom aspiration catheter were retrieved and removed. Following reversal of flow arrest, a control arteriogram  performed through the balloon guide catheter in the right internal carotid artery demonstrates revascularization of the right middle cerebral artery and also of the superior and inferior divisions. The anterior temporal branch remains patent. A TICI 2B revascularization was achieved. This also unmasked an irregular filling defect at the right middle cerebral artery terminus at the trifurcation region with flow noted in the distal inferior division. A second pass was then made again with the combination of the 071 Zoom catheter advanced over an 021 160 cm Phenom microcatheter over a 0.014 inch standard Synchro micro guidewire. Again access was obtained into the distal M2 M3 region of the inferior division with the micro guidewire then followed by the microcatheter. The guidewire was removed. Again safe position of the tip of the microcatheter was confirmed and connected to continuous heparinized saline infusion. A 4 mm x 40 mm Solitaire X retrieval device was then deployed in the manner described above. Again with proximal flow arrest in the right internal carotid artery proximally, and constant aspiration via the Zoom catheter which was now imbedded in the occluded right middle cerebral artery at its trifurcation region over approximately 2-1/2  minutes, the combination of the retrieval device, the microcatheter, and the Zoom catheter was retrieved and removed. Following reversal of flow arrest, a control arteriogram performed through the right internal carotid artery demonstrated improved opacification of the right MCA trifurcation branches. There continued be occluded superior division. The combination of the microcatheter, inside a 071 Zoom aspiration catheter was again advanced to the right middle cerebral artery over a 0.014 inch standard Synchro micro guidewire, which was now advanced into the superior division of the right middle cerebral artery emanating at the MCA trifurcation region. The microcatheter was advanced to the M2 region over a micro guidewire. The micro guidewire was removed. Good aspiration obtained from the hub of the microcatheter. Again with flow arrest in the right internal carotid artery proximally, and constant aspiration with the Penumbra aspiration device at the hub of the aspiration catheter following deployment of a 3 mm x 20 mm Solitaire X retrieval device, for 2 minutes, the combination of the retrieval device, the microcatheter and the Zoom catheter were retrieved and removed. The superior division continued to be occluded. The superior division was again cannulated with a microcatheter over a micro guidewire as described above. After having verified safe position of the tip of the microcatheter, in the superior division M2 region, a 4 mm x 40 mm Solitaire X retrieval device was deployed in the usual manner. Again with proximal flow arrest in the proximal right ICA, and constant aspiration at the hub of the Zoom catheter at the site of the occluded superior division in the distal right middle cerebral M1 segment over 2 minutes, the combination of the retrieval device, the microcatheter and the retrieval device were retrieved and removed. Following reverse of flow arrest, there appeared to be modest improved flow in the  proximal superior division. It was now noted the inferior division had a near occlusive clot in the proximal aspect. A fourth pass was then made this time using a combination of the Phenom microcatheter, inside of a 6 [place] Catalyst 135 cm catheter advanced over a 0.014 inch standard Synchro micro guidewire to the distal right M1 segment. The micro guidewire was then advanced into the M2 M3 region of the inferior division followed by the microcatheter. The guidewire was removed. A 4 mm x 40 mm Solitaire X retrieval device was deployed with the Catalyst guide  catheter advanced at the origin of inferior division and distal to this. Following proximal flow arrest and constant aspiration for approximately 2 minutes at the hub of the Catalyst guide catheter, the combination of the retrieval device, the microcatheter and the Catalyst catheter were retrieved and removed following reversal of flow arrest. Improved flow was now noted into the inferior division proximally through the two main branch points. Following each thrombectomy, strips of fragments were seen intertwined either in the retrieval device, or in the aspiration catheters. 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 It was therefore decided to proceed with placement of a rescue stent in order to prevent occlusion of the major right MCA inferior division branch. The microcatheter was again advanced over a 0.014 inch standard Synchro micro guidewire with a 6 [place] Catalyst guide catheter. The microcatheter was advanced over a micro guidewire without difficulty into the M2 region followed by the removal of the micro guidewire. Again good aspiration obtained from the hub of the microcatheter. This was then connected to continuous heparinized saline infusion. Measurements performed of the right middle cerebral artery in the mid M1 segment, and also the proximal inferior division distal to  the severe stenosis. A 4 mm x 24 mm Neuroform Atlas stent was advanced to the distal end of the microcatheter. The O ring on the delivery microcatheter was then loosened. With slight forward gentle traction with the right hand on the delivery micro guidewire with left hand the delivery microcatheter was gently retrieved unsheathing the distal and then the proximal portion of the stent with excellent coverage at the site of the severe stenosis. The delivery apparatus was removed. A control arteriogram performed through the balloon guide in the distal cervical ICA on the right demonstrated now significantly improved caliber and flow through the right middle cerebral artery the dominant inferior division. There was poor stagnant flow noted in the proximal superior division. Free flow through the stented segment and also the other branch of the right middle cerebral artery except for the superior division. 8 mg of Integrilin was given intra-arterially in order to prevent intra stent platelet aggregation. A 10 minute post stent arteriogram continued to demonstrate flow through the stented segment in the right MCA distribution except for the non dominant superior division. A TICI 2b revascularization was maintained. The right posterior communicating artery with the right posterior cerebral artery distribution remained unchanged. Throughout the procedure, the patient's blood pressure and neurological status remained stable. A CT of the brain performed following the third pass demonstrated no mass-effect or midline shift with mild element of contrast in the right perisylvian region. The balloon guide was then retrieved and removed. The 8 French 25 cm Pinnacle sheath was then exchanged over a 0.035 inch J-tip guidewire for a short 8 [place] Pinnacle sheath. An arteriogram performed through this demonstrated spasm in the previously noted tortuous right common iliac artery. There was small amount of contrast noted in the soft  tissue at the site of entry of the 8 [place] Pinnacle sheath which completely disappeared on repeat arteriogram performed approximately 3 minutes later. The 8 French sheath was removed and a 7 [place] ExoSeal closure device was placed. Also minimal compression was held for approximately 25 minutes. The distal pulses remained Dopplerable in the dorsalis pedis, and the posterior tibial regions bilaterally. Patient was left intubated on account of the patient's inability to communicate due to difficulty hearing and also her neurological condition. During this time,  while pressure was held, and firmness was felt in the region of the pannus on the right side in the right lower quadrant, and also in the right inguinal region. In view of the unstable blood pressure, the patient was sent to CT scan for CT of the pelvis and also of the abdomen and also CT of the brain. CT of the brain revealed no evidence of mass effect or midline shift with no significant change in the right perisylvian hyperattenuation felt to be due to a contrast stain. CT of the abdomen and pelvis revealed no evidence of the overlying skin tightness. Hemodynamically the patient was now stable with blood pressure in the 150s over 80s and the heart rate being regular in the 80s to 90s sinus rhythm. Patient was then transferred to the neuro ICU intubated for post thrombectomy management. IMPRESSION: Status post endovascular revascularization of occluded right middle cerebral M1 segment with 4 passes with different stent retrievers and Penumbra aspiration achieving a TICI 2B revascularization. Status post rescue stent placement from the distal M1 segment into the proximal dominant inferior division of the right middle cerebral artery due to underlying severe intracranial arteriosclerosis. PLAN: Follow-up in the clinic 4 weeks post discharge. Electronically Signed   By: Luanne Bras M.D.   On: 12/30/2019 17:15   CT HEAD CODE STROKE WO CONTRAST  Result  Date: 12/29/2019 CLINICAL DATA:  Code stroke.  Left-sided weakness EXAM: CT HEAD WITHOUT CONTRAST TECHNIQUE: Contiguous axial images were obtained from the base of the skull through the vertex without intravenous contrast. COMPARISON:  None. FINDINGS: Brain: There is no acute intracranial hemorrhage or mass effect. Gray-white differentiation is preserved. Ventricles and sulci are within normal limits in size and configuration. Patchy hypoattenuation in the supratentorial white matter is nonspecific but probably reflects chronic microvascular ischemic changes. Extra-axial dural-based hyperdense lesion along the high left frontal convexity and falx measuring 2.6 x 1.8 x 2.3 cm is consistent with a meningioma. There is no apparent underlying edema. This abuts the superior sagittal sinus. Vascular: No hyperdense vessel. Mild intracranial atherosclerotic calcification at the skull base. Skull: Unremarkable. Sinuses/Orbits: No acute abnormality. Other: Mastoid air cells are clear. ASPECTS ([place] Stroke Program Early CT Score) - Ganglionic level infarction (caudate, lentiform nuclei, internal capsule, insula, M1-M3 cortex): 7 - Supraganglionic infarction (M4-M6 cortex): 3 Total score (0-10 with 10 being normal): 10 IMPRESSION: No acute intracranial hemorrhage or evidence of acute infarction. ASPECT score is 10. Chronic microvascular ischemic changes. Left frontal meningioma without significant mass effect or underlying edema. Initial results were communicated to Dr. Lorraine Lax at [place] 6/27/2021by text page via the [facility] messaging system. Electronically Signed   By: Macy Mis M.D.   On: 12/29/2019 18:20   ECHOCARDIOGRAM LIMITED  Result Date: 12/30/2019    ECHOCARDIOGRAM LIMITED REPORT   Patient Name:   Gloria [name] Date of Exam: 12/30/2019 Medical Rec #:  [id number]           Height:       62.0 in Accession #:    [id number]          Weight:       211.0 lb Date of Birth:  [birth year]            BSA:           1.956 m Patient Age:    40 years            BP:           132/80 mmHg  Patient Gender: F                   HR:           64 bpm. Exam Location:  Inpatient Procedure: Limited Color Doppler, Cardiac Doppler and Limited Echo Indications:    stroke 434.91  History:        Patient has prior history of Echocardiogram examinations, most                 recent 11/09/2019. Arrythmias:Atrial Fibrillation.  Sonographer:    Johny Chess Referring Phys: [id number] [place]  1. Left ventricular ejection fraction, by estimation, is >75%. The left ventricle has hyperdynamic function. The left ventricle has no regional wall motion abnormalities. There is severe left ventricular hypertrophy. Left ventricular diastolic parameters are indeterminate.  2. Right ventricular systolic function is normal. The right ventricular size is normal. There is mildly elevated pulmonary artery systolic pressure.  3. Tricuspid valve regurgitation is mild to moderate.  4. The aortic valve is abnormal. Aortic valve regurgitation is mild to moderate. Mild aortic valve sclerosis is present, with no evidence of aortic valve stenosis.  5. The inferior vena cava is normal in size with greater than 50% respiratory variability, suggesting right atrial pressure of 3 mmHg. FINDINGS  Left Ventricle: Left ventricular ejection fraction, by estimation, is >75%. The left ventricle has hyperdynamic function. The left ventricle has no regional wall motion abnormalities. The left ventricular internal cavity size was small. There is severe left ventricular hypertrophy. Right Ventricle: The right ventricular size is normal. No increase in right ventricular wall thickness. Right ventricular systolic function is normal. There is mildly elevated pulmonary artery systolic pressure. The tricuspid regurgitant velocity is 3.14  m/s, and with an assumed right atrial pressure of 3 mmHg, the estimated right ventricular systolic pressure is 83.1 mmHg.  Pericardium: Trivial pericardial effusion is present. Mitral Valve: The mitral valve is abnormal. There is mild thickening of the mitral valve leaflet(s). Moderate mitral annular calcification. Tricuspid Valve: The tricuspid valve is grossly normal. Tricuspid valve regurgitation is mild to moderate. Aortic Valve: The aortic valve is abnormal. Aortic valve regurgitation is mild to moderate. Mild aortic valve sclerosis is present, with no evidence of aortic valve stenosis. Pulmonic Valve: The pulmonic valve was normal in structure. Pulmonic valve regurgitation is not visualized. Aorta: The aortic root and ascending aorta are structurally normal, with no evidence of dilitation. Venous: The inferior vena cava is normal in size with greater than 50% respiratory variability, suggesting right atrial pressure of 3 mmHg.  LEFT VENTRICLE PLAX 2D LVIDd:         3.10 cm LVIDs:         1.60 cm LV PW:         1.70 cm LV IVS:        1.60 cm LVOT diam:     1.90 cm LVOT Area:     2.84 cm  IVC IVC diam: 1.90 cm LEFT ATRIUM         Index LA diam:    4.70 cm 2.40 cm/m   AORTA Ao Root diam: 3.50 cm Ao Asc diam:  3.50 cm TRICUSPID VALVE TR Peak grad:   39.4 mmHg TR Vmax:        314.00 cm/s  SHUNTS Systemic Diam: 1.90 cm Dorris Carnes MD Electronically signed by Dorris Carnes MD Signature Date/Time: 12/30/2019/7:38:12 PM    Final     PHYSICAL EXAM   Temp:  [51  F (36.7 C)-99.3 F (37.4 C)] 98 F (36.7 C) (07/01 0800) Pulse Rate:  [55-78] 78 (07/01 0800) Resp:  [14-23] 21 (07/01 0800) BP: (109-154)/(50-78) 142/72 (07/01 0800) SpO2:  [96 %-100 %] 99 % (07/01 0800) Weight:  [94.3 kg] 94.3 kg (07/01 0500)  General - Well nourished, well developed, lethargic.  Ophthalmologic - fundi not visualized due to noncooperation.  Cardiovascular - irregularly irregular heart rate and rhythm.  Neuro - lethargic, but eyes spontaneously open, but not following commands due to deafness. Eyes right forced gaze,  blinking to visual threat on  the right but not on the left, not tracking, PERRL. Left facial droop.  Tongue protrusion not cooperative. Purposeful movement of RUE against gravity. On pain stimulation, localizing to pain with RUE, withdraw 2/5 RLE, no movement of LUE, and slight withdraw LLE. However, LLE swollen more at knee joint region. DTR diminished on the left and left positive babinski. Sensation, coordination and gait not tested.  ASSESSMENT/PLAN Ms. JAYNI PRESCHER is a 74 y.o. female with history of bilateral ureteral calculi, HTN, Atrial Fibrillation not on anticoagulation (not on [facility] due to high risk of falls), who presents with left sided hemiplegia and right gaze preference. CTH in the ED revealed left frontal meningioma, CTA with a right M1 occlusion. She received IVTPA on 12/29/19 at 1830 w/LKW 1645 and was taken for mechanical thrombectomy with resulting TICI 2B revascularization w/placement of recue stent in the right MCA.  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 Small vessel disease. Atrophy. ASPECTS 10.     CTA head & neck R M1 occlusion, fetal PCA on the right  Cerebral angio R M1 occlusion w/p TICI 2b revascularization. 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    Post IR CT hyperdensity R subarachnoid space, stable L parafalcine meningioma  MRI  R occipital infarct. R insular and frontal operculum infarct. Small scattered R frontal and parietal infarcts.  MRA  fetal R PCA. Missing superior division R MCA. R MCA stent, inferior division with flow.  LE Doppler  No DVT  2D Echo EF > 75%, severe LVH. Mild to moderate TVR.  LDL 90  HgbA1c 5.2  Heparin subq for VTE prophylaxis  aspirin 325 mg daily prior to admission, now on aspirin 81 mg daily and Brilinta (ticagrelor) 90 mg bid given stent placement. Plant to continue  Brilinta at d/c with [facility] if anemia is stable  Therapy recommendations:  CIR  Disposition:  pending   Acute Respiratory Failure  intubated for IR, left intubated for airway protection  Extubation 6/29  Tolerated well  Hematoma R groin  pseudoanerysm w/ extravasation following IR  CT abd & pelvis R inguinal hematoma into abdominal wall. Stable B ureteral stents and B adrenal adenoma. Aortic atherosclerosis.    Hgb 9.2->8.8->8.4->7.8->7.3 - 1u PRBC - 8.7->8.6  [place] right groin x 2 - no pseudoaneurysm   Hematuria  S/p B ureteral stents exchange 6/4 d/t B renal calculi  ABD CT Mild B hydronephrosis, stents in place  Hgb 9.2->8.8->8.4->7.8->7.3 - 1u PRBC - 8.7->8.6  Cre 1.9->1.5->1.44->1.88->1.64->1.49  Urology consulted, appreciate help  Ureteral stents removed by urology 6/30 followed with 1 dose cipro  ongoing Cre monitor, if elevated, consider [place] abd to rule out hydro  Atrial Fibrillation  Home anticoagulation:  none d/t high fall risk  Not timing for [facility] at this time  Plan with AC at d/c if anemia stable   Left leg swelling  LE venous doppler pending to rule out DVT  XR of left knee and hip pending  On heparin subq for DVT prophylaxis   Continue SCDs  Hypertension  Home meds:  Coreg 6.25 bid  Stable 110-140  SBP goal now < 180  Long-term BP goal normotensive  Hyperlipidemia  Home meds:  No statin  LDL 90, goal < 70  lipitor 40 now  Continue statin at discharge  Dysphagia  Secondary to stroke  NPO  Speech on board  Cortrak placed  On TF @ 65 and IVF @ 25   CKD stage IIIab  Cre 1.49->1.76->1.50->1.88->1.64->1.49  Could be due to contrast nephrotoxicity  On IVF and TF  Continue monitoring BMP  Other Stroke Risk Factors  Advanced age  Obesity, Body mass index is 38.59 kg/m., recommend weight loss, diet and exercise as appropriate   Other Active Problems  urinary incontinence on mirabegron PTA  [facility]  day # 4  Patient continues to be critically ill for the last 24 hours, has developed left leg swelling, continues to have lethargic, anemia, afib, and I have ordered LLE XR and venous doppler for evaluation.   This patient is critically ill due to right MCA stroke, right MCA occlusion, status post TPA and thrombectomy, intracranial stent, hematuria A. fib, respirate failure and at significant risk of neurological worsening, death form recurrent stroke, hemorrhagic conversion, seizure, heart failure, hypotension. This patient's care requires constant monitoring of vital signs, hemodynamics, respiratory and cardiac monitoring, review of multiple databases, neurological assessment, discussion with family, other specialists and medical decision making of high complexity. I spent 35 minutes of neurocritical care time in the care of this patient.    Rosalin Hawking, MD PhD Stroke Neurology 01/02/2020 9:10 AM    To contact Stroke Continuity provider, please refer to [email or web address] After hours, contact General Neurology

## 2020-01-02 NOTE — Progress Notes (Signed)
Referring Physician(s): Code Stroke- Aroor, Karena Addison R  Supervising Physician: Luanne Bras  Patient Status:  [facility] - In-pt  Chief Complaint: None  Subjective: Patient laying in bed with NGT in place.  She opens eyes to gentle touch but does not follow simple commands (very hard of hearing).  She does not track with eyes or attempt to communicate.  Can spontaneously move right side, left side withdraws from pain with the lower extremity.  Note left knee swelling and bruising present on exam today.   Allergies: Aspartame and phenylalanine, Azithromycin, Bee venom, Cortizone-5 [hydrocortisone], Olmesartan, Other, Penicillins, Valsartan, and Latex  Medications: Prior to Admission medications   Medication Sig Start Date End Date Taking? Authorizing Provider  aspirin 325 MG tablet Take 325 mg by mouth daily.   Yes [provider]  carvedilol (COREG) 6.25 MG tablet Take 6.25 mg by mouth 2 (two) times daily. 11/08/19  Yes [provider]  furosemide (LASIX) 20 MG tablet Take 1 tablet (20 mg total) by mouth as needed for fluid. Patient taking differently: Take 20 mg by mouth daily as needed for fluid.  11/15/19  Yes Pokhrel, Laxman, MD  magnesium oxide (MAG-OX) 400 MG tablet Take 400 mg by mouth 2 (two) times daily. 11/12/19  Yes [provider]  traMADol (ULTRAM) 50 MG tablet Take 1-2 tablets (50-100 mg total) by mouth every 6 (six) hours as needed for moderate pain. 12/06/19  Yes Ardis Hughs, MD  mirabegron ER (MYRBETRIQ) 25 MG TB24 tablet Take 1 tablet (25 mg total) by mouth daily. 11/22/19   Ardis Hughs, MD     Vital Signs: BP (!) 142/72 (BP Location: Left Arm)   Pulse 78   Temp 98 F (36.7 C) (Axillary)   Resp (!) 21   Wt 207 lb 14.3 oz (94.3 kg)   SpO2 99%   BMI 38.02 kg/m   Physical Exam Vitals and nursing note reviewed.   Groin: Right groin puncture site soft today.  No extension of bruising.  Appears to be in stages of  healing.  Mildly tender to palpation.  Note dressing to anterior hip from small skin tear.  MSK: L knee with notable swelling on the medial aspect.  Large area of bruising.  Appears tender to palpation.  Neuro: Lethargic but opens eyes to gentle touch (deaf).  Audible whimper when touching left knee- otherwise no verbalization. Does not follow simple commands.  Moving left lower extremity in withdrawal to stimulation.  No movement noted in LUE.  Pulses: Distal pulses (DPs) 1+ bilaterally. Imaging: CT ABDOMEN PELVIS WO CONTRAST  Result Date: 12/29/2019 CLINICAL DATA:  Right inguinal hematoma, recent neuro interventional procedure, assess for retroperitoneal hematoma EXAM: CT ABDOMEN AND PELVIS WITHOUT CONTRAST TECHNIQUE: Multidetector CT imaging of the abdomen and pelvis was performed following the standard protocol without IV contrast. COMPARISON:  11/08/2019 FINDINGS: Lower chest: Hypoventilatory changes are seen within the dependent lower lobes. Heart is enlarged with trace pericardial fluid unchanged. Hepatobiliary: Gallbladder surgically absent. Unenhanced imaging the liver demonstrates no gross abnormalities. Pancreas: Unremarkable. No pancreatic ductal dilatation or surrounding inflammatory changes. Spleen: Normal in size without focal abnormality. Adrenals/Urinary Tract: Excreted contrast is seen within the bilateral kidneys related to previous neuro interventional procedure. Bilateral renal cortical thinning is noted. Stable bilateral adrenal adenoma. Bilateral ureteral stents extend from the renal pelves into the bladder lumen. No filling defects within the bladder. Stomach/Bowel: Enteric catheter extends into the gastric lumen. No bowel obstruction or ileus. No bowel wall thickening  or inflammatory change. Vascular/Lymphatic: Aortic atherosclerosis. No enlarged abdominal or pelvic lymph nodes. Reproductive: Uterus and bilateral adnexa are unremarkable. Other: Hematoma is seen within the right  inguinal region extending along the inguinal crease into the pannus of the right lower quadrant abdominal wall. This measures up to 3.7 cm in thickness. There is no evidence of extension into the retroperitoneal space. At the time of the exam, extrinsic compression is being held at the right groin. There is no free fluid or free gas within the peritoneal cavity. Fat containing hernia again noted within the left lower quadrant abdominal wall. No bowel herniation. Musculoskeletal: No acute or destructive bony lesions. Reconstructed images demonstrate no additional findings. IMPRESSION: 1. Hematoma within the right inguinal region extending along the inguinal crease into the pannus of the right lower quadrant abdominal wall. No extension into the retroperitoneal space. 2. Bilateral ureteral stents as above. 3. Stable bilateral adrenal adenoma. 4. Aortic Atherosclerosis (ICD10-I70.0). Electronically Signed   By: Randa Ngo M.D.   On: 12/29/2019 23:48   CT Code Stroke CTA Head W/WO contrast  Result Date: 12/29/2019 CLINICAL DATA:  Left-sided weakness, code stroke follow-up EXAM: CT ANGIOGRAPHY HEAD AND NECK TECHNIQUE: Multidetector CT imaging of the head and neck was performed using the standard protocol during bolus administration of intravenous contrast. Multiplanar CT image reconstructions and MIPs were obtained to evaluate the vascular anatomy. Carotid stenosis measurements (when applicable) are obtained utilizing NASCET criteria, using the distal internal carotid diameter as the denominator. CONTRAST:  32mL OMNIPAQUE IOHEXOL 350 MG/ML SOLN COMPARISON:  None. FINDINGS: CTA NECK Aortic arch: Minimal calcified plaque along the aortic arch. Mild calcified plaque at the great vessel origins, which are patent. Right carotid system: Patent. Common carotid has a retropharyngeal course. Mild calcified plaque at the ICA origin causing minimal stenosis. Left carotid system: Patent. Common carotid has a retropharyngeal  course. Mild calcified plaque at the ICA origin causing minimal stenosis. Vertebral arteries: Patent. Left vertebral artery is dominant. Suspected stenosis of the proximal right vertebral artery. Skeleton: Degenerative changes of the cervical spine. Other neck: No mass or adenopathy. Upper chest: Enlargement of the main pulmonary artery suggesting pulmonary arterial hypertension. Review of the MIP images confirms the above findings CTA HEAD Anterior circulation: Intracranial internal carotid arteries are patent with calcified plaque causing mild stenosis. Anterior cerebral arteries are patent. Left A1 ACA is dominant with diminutive or absent right A1 segment. There is occlusion of the mid right M1 MCA. There is preserved opacification of the more distal right MCA territory. Left MCA is patent. Posterior circulation: Intracranial vertebral arteries are patent. Basilar artery is patent. Posterior cerebral arteries are patent. There are posterior communicating arteries present bilaterally with fetal origin of the right PCA. Venous sinuses: Patent as allowed by contrast bolus timing. Review of the MIP images confirms the above findings IMPRESSION: Occlusion of the right M1 MCA. However, there is good filling of the more distal MCA territory. No hemodynamically significant stenosis in the neck. These results were communicated to Dr. Lorraine Lax at [place] 6/27/2021by text page via the [facility] messaging system. Electronically Signed   By: Macy Mis M.D.   On: 12/29/2019 19:10   CT HEAD WO CONTRAST  Result Date: 12/29/2019 CLINICAL DATA:  Stroke follow-up.  Status post thrombectomy EXAM: CT HEAD WITHOUT CONTRAST TECHNIQUE: Contiguous axial images were obtained from the base of the skull through the vertex without intravenous contrast. COMPARISON:  Head CT 12/29/2019 FINDINGS: Brain: There is hyperdense material in the right subarachnoid  space, overlying the frontal operculum and within the sylvian fissure. No other  extra-axial collection. No midline shift or other mass effect. No intraparenchymal hemorrhage. 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 Vascular: Status post right MCA stent placement. Skull: Normal. Negative for fracture or focal lesion. Sinuses/Orbits: No acute finding. Other: None. IMPRESSION: 1. Hyperdense material in the right subarachnoid space, overlying the frontal operculum and within the Sylvian fissure. This is most likely contrast staining, but follow-up studies will be necessary to differentiate from subarachnoid hemorrhage. 2. Unchanged left parafalcine meningioma. Electronically Signed   By: Ulyses Jarred M.D.   On: 12/29/2019 23:49   CT Code Stroke CTA Neck W/WO contrast  Result Date: 12/29/2019 CLINICAL DATA:  Left-sided weakness, code stroke follow-up EXAM: CT ANGIOGRAPHY HEAD AND NECK TECHNIQUE: Multidetector CT imaging of the head and neck was performed using the standard protocol during bolus administration of intravenous contrast. Multiplanar CT image reconstructions and MIPs were obtained to evaluate the vascular anatomy. Carotid stenosis measurements (when applicable) are obtained utilizing NASCET criteria, using the distal internal carotid diameter as the denominator. CONTRAST:  81mL OMNIPAQUE IOHEXOL 350 MG/ML SOLN COMPARISON:  None. FINDINGS: CTA NECK Aortic arch: Minimal calcified plaque along the aortic arch. Mild calcified plaque at the great vessel origins, which are patent. Right carotid system: Patent. Common carotid has a retropharyngeal course. Mild calcified plaque at the ICA origin causing minimal stenosis. Left carotid system: Patent. Common carotid has a retropharyngeal course. Mild calcified plaque at the ICA origin causing minimal stenosis. Vertebral arteries: Patent. Left vertebral artery is dominant. Suspected stenosis of the proximal right vertebral artery. Skeleton: Degenerative changes of the cervical spine. Other neck: No mass or adenopathy. Upper chest:  Enlargement of the main pulmonary artery suggesting pulmonary arterial hypertension. Review of the MIP images confirms the above findings CTA HEAD Anterior circulation: Intracranial internal carotid arteries are patent with calcified plaque causing mild stenosis. Anterior cerebral arteries are patent. Left A1 ACA is dominant with diminutive or absent right A1 segment. There is occlusion of the mid right M1 MCA. There is preserved opacification of the more distal right MCA territory. Left MCA is patent. Posterior circulation: Intracranial vertebral arteries are patent. Basilar artery is patent. Posterior cerebral arteries are patent. There are posterior communicating arteries present bilaterally with fetal origin of the right PCA. Venous sinuses: Patent as allowed by contrast bolus timing. Review of the MIP images confirms the above findings IMPRESSION: Occlusion of the right M1 MCA. However, there is good filling of the more distal MCA territory. No hemodynamically significant stenosis in the neck. These results were communicated to Dr. Lorraine Lax at [place] 6/27/2021by text page via the [facility] messaging system. Electronically Signed   By: Macy Mis M.D.   On: 12/29/2019 19:10   MR ANGIO HEAD WO CONTRAST  Result Date: 12/30/2019 CLINICAL DATA:  Status post right M1 occlusion with intervention EXAM: MRI HEAD WITHOUT CONTRAST MRA HEAD WITHOUT CONTRAST TECHNIQUE: Multiplanar, multiecho pulse sequences of the brain and surrounding structures were obtained without intravenous contrast. Angiographic images of the head were obtained using MRA technique without contrast. COMPARISON:  Multiple CT studies done yesterday. FINDINGS: MRI HEAD FINDINGS Brain: Diffusion imaging shows acute infarction affecting the right occipital cortex. Acute infarction affects the right insular region and frontal operculum. Small foci of acute infarction elsewhere in the right frontal and parietal regions. Areas of infarction show mild  swelling but there is no mass effect. Small focus of susceptibility in the right sylvian fissure region may be  intravascular. Few small foci of susceptibility in the right parietal region also suspected to be intravascular. No sign of parenchymal hematoma. No hydrocephalus. No extra-axial collection. Mild chronic small-vessel ischemic changes elsewhere throughout the cerebral hemispheric white matter. 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 Vascular: Stent in the right M1 region. Skull and upper cervical spine: Negative Sinuses/Orbits: Clear/normal Other: None MRA HEAD FINDINGS Both internal carotid arteries show antegrade flow through the skull base. On the left, there is supply in the left MCA territory and both anterior cerebral artery territories. Moderate atherosclerotic irregularity of the MCA branches. On the right, there is signal loss related to the right MCA stent. More distal MCA branch vessels do show supply, with absence of the superior division. Right vertebral artery terminates in PICA. Left vertebral artery supplies the basilar. 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 Left PCA arises from the basilar tip. Distal vessel atherosclerotic irregularity. Right PCA arises from the right carotid and shows poor supply of the distal branches. 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 Fetal origin right PCA with poor flow in the distal branch vessels. Acute infarction of the right insular region and frontal operculum. Missing superior division right MCA. Small scattered other acute infarctions in the right frontal and parietal regions. Swelling but no mass effect. No intraparenchymal hematoma. Few small foci of petechial blood versus vascular susceptibility signal. Right MCA stent. Missing superior division as noted above. Inferior division shows flow. Electronically Signed   By: Nelson Chimes  M.D.   On: 12/30/2019 19:21   MR BRAIN WO CONTRAST  Result Date: 12/30/2019 CLINICAL DATA:  Status post right M1 occlusion with intervention EXAM: MRI HEAD WITHOUT CONTRAST MRA HEAD WITHOUT CONTRAST TECHNIQUE: Multiplanar, multiecho pulse sequences of the brain and surrounding structures were obtained without intravenous contrast. Angiographic images of the head were obtained using MRA technique without contrast. COMPARISON:  Multiple CT studies done yesterday. FINDINGS: MRI HEAD FINDINGS Brain: Diffusion imaging shows acute infarction affecting the right occipital cortex. Acute infarction affects the right insular region and frontal operculum. Small foci of acute infarction elsewhere in the right frontal and parietal regions. Areas of infarction show mild swelling but there is no mass effect. Small focus of susceptibility in the right sylvian fissure region may be intravascular. Few small foci of susceptibility in the right parietal region also suspected to be intravascular. No sign of parenchymal hematoma. No hydrocephalus. No extra-axial collection. Mild chronic small-vessel ischemic changes elsewhere throughout the cerebral hemispheric white matter. 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 Vascular: Stent in the right M1 region. Skull and upper cervical spine: Negative Sinuses/Orbits: Clear/normal Other: None MRA HEAD FINDINGS Both internal carotid arteries show antegrade flow through the skull base. On the left, there is supply in the left MCA territory and both anterior cerebral artery territories. Moderate atherosclerotic irregularity of the MCA branches. On the right, there is signal loss related to the right MCA stent. More distal MCA branch vessels do show supply, with absence of the superior division. Right vertebral artery terminates in PICA. Left vertebral artery supplies the basilar. Moderate to severe stenosis of the  distal basilar artery. Left PCA arises from the basilar tip. Distal vessel atherosclerotic irregularity. Right PCA arises from the right carotid and shows poor supply of the distal branches. 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 Fetal origin right PCA  with poor flow in the distal branch vessels. Acute infarction of the right insular region and frontal operculum. Missing superior division right MCA. Small scattered other acute infarctions in the right frontal and parietal regions. Swelling but no mass effect. No intraparenchymal hematoma. Few small foci of petechial blood versus vascular susceptibility signal. Right MCA stent. Missing superior division as noted above. Inferior division shows flow. Electronically Signed   By: Nelson Chimes M.D.   On: 12/30/2019 19:21   IR Intra Cran Stent  Result Date: 12/31/2019 INDICATION: New onset left-sided hemiplegia, right gaze deviation. Occluded right middle cerebral M1 segment on CT angiogram of the head and neck. EXAM: 1. EMERGENT LARGE VESSEL OCCLUSION THROMBOLYSIS (anterior CIRCULATION) COMPARISON:  CT angiogram of the head and neck of June, 27, 2021. MEDICATIONS: Vancomycin 1 g IV was administered within 1 hour of the procedure. ANESTHESIA/SEDATION: General anesthesia CONTRAST:  Isovue 300 approximately 170 mL FLUOROSCOPY TIME:  Fluoroscopy Time: 131 minutes 12 seconds (4835 mGy). COMPLICATIONS: None immediate. TECHNIQUE: Following a full explanation of the procedure along with the potential associated complications, an informed witnessed consent was obtained the patient's son. The risks of intracranial hemorrhage of 10%, worsening neurological deficit, ventilator dependency, death and inability to revascularize were all reviewed in detail with the patient's son. The patient was then put under general anesthesia by the Department of Anesthesiology at [facility]. The right groin was prepped and draped in the usual sterile fashion.  Thereafter using modified Seldinger technique, transfemoral access into the right common femoral artery was obtained without difficulty. Over a 0.035 inch guidewire an 8 French 25 cm Pinnacle sheath was inserted. Through this, and also over a 0.035 inch guidewire a combination of a select 5 French 125 cm Simmons 2 catheter inside of a 95 cm 087 balloon guide catheter was advanced without difficulty to the right common carotid artery. The guidewire and the select catheter were removed. Good aspiration was obtained from the hub of the balloon guide catheter just proximal to the right common carotid artery bifurcation. Arteriogram was then performed centered extra cranially and intracranially. FINDINGS: The right common carotid arteriogram demonstrates the right external carotid artery and its major branches to be widely patent. The right internal carotid artery at the bulb demonstrates a smooth shallow plaque. Moderate tortuosity was noted of the proximal right internal carotid artery. 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 Distal to this the cervical petrous junction demonstrates normal opacification. Distal to this the petrous, cavernous, and the cavernous segments demonstrate patency as does the supraclinoid segment. Opacification is seen of the right posterior communicating artery opacifying the right posterior cerebral distribution. The right middle cerebral artery demonstrates complete angiographic occlusion at the origin of the anterior temporal branch. Focal areas of caliber irregularity are seen of the proximal right middle cerebral artery. PROCEDURE: Through the 087 balloon guide catheter in the proximal right internal carotid artery, a Zoom 071 135 cm catheter inside of which was  an 021 160 cm Phenom microcatheter was advanced over a 0.014 inch standard Synchro micro guidewire gingerly through the right internal carotid artery in the neck and into the supraclinoid right ICA. The micro guidewire was then gently manipulated with a torque device and advanced into the inferior division M2 M3 region of the right middle cerebral artery followed by the  microcatheter. The guidewire was removed. Good aspiration was obtained from the hub of the microcatheter. Gentle contrast injection demonstrated safe position of tip of the microcatheter. A 5 mm x 37 mm Embotrap retrieval device was then advanced to the distal end of the microcatheter. The O ring on the delivery micro guidewire was loosened. With slight forward gentle traction with the right hand on the delivery micro guidewire, with the left hand the retrieval device was deployed. The 071 Zoom catheter was then advanced into the right middle cerebral artery engaging the occluded right middle cerebral artery. With constant flow arrest in the right internal carotid artery, and constant aspiration using a Penumbra device at the hub of the 071 Zoom catheter for approximately 3 minutes, the combination of the retrieval device, the microcatheter, and the Zoom aspiration catheter were retrieved and removed. Following reversal of flow arrest, a control arteriogram performed through the balloon guide catheter in the right internal carotid artery demonstrates revascularization of the right middle cerebral artery and also of the superior and inferior divisions. The anterior temporal branch remains patent. A TICI 2B revascularization was achieved. This also unmasked an irregular filling defect at the right middle cerebral artery terminus at the trifurcation region with flow noted in the distal inferior division. A second pass was then made again with the combination of the 071 Zoom catheter advanced over an 021 160 cm Phenom microcatheter over a 0.014 inch  standard Synchro micro guidewire. Again access was obtained into the distal M2 M3 region of the inferior division with the micro guidewire then followed by the microcatheter. The guidewire was removed. Again safe position of the tip of the microcatheter was confirmed and connected to continuous heparinized saline infusion. A 4 mm x 40 mm Solitaire X retrieval device was then deployed in the manner described above. Again with proximal flow arrest in the right internal carotid artery proximally, and constant aspiration via the Zoom catheter which was now imbedded in the occluded right middle cerebral artery at its trifurcation region over approximately 2-1/2 minutes, the combination of the retrieval device, the microcatheter, and the Zoom catheter was retrieved and removed. Following reversal of flow arrest, a control arteriogram performed through the right internal carotid artery demonstrated improved opacification of the right MCA trifurcation branches. There continued be occluded superior division. The combination of the microcatheter, inside a 071 Zoom aspiration catheter was again advanced to the right middle cerebral artery over a 0.014 inch standard Synchro micro guidewire, which was now advanced into the superior division of the right middle cerebral artery emanating at the MCA trifurcation region. The microcatheter was advanced to the M2 region over a micro guidewire. The micro guidewire was removed. Good aspiration obtained from the hub of the microcatheter. Again with flow arrest in the right internal carotid artery proximally, and constant aspiration with the Penumbra aspiration device at the hub of the aspiration catheter following deployment of a 3 mm x 20 mm Solitaire X retrieval device, for 2 minutes, the combination of the retrieval device, the microcatheter and the Zoom catheter were retrieved and removed. The superior division continued to be occluded. The superior division was again cannulated with  a microcatheter over a micro guidewire as described above. After having verified safe position of the tip of the microcatheter, in the superior division M2 region, a 4 mm x 40 mm Solitaire X retrieval device was deployed in the usual manner. Again with proximal flow arrest in the proximal right ICA, and constant aspiration at  the hub of the Zoom catheter at the site of the occluded superior division in the distal right middle cerebral M1 segment over 2 minutes, the combination of the retrieval device, the microcatheter and the retrieval device were retrieved and removed. Following reverse of flow arrest, there appeared to be modest improved flow in the proximal superior division. It was now noted the inferior division had a near occlusive clot in the proximal aspect. A fourth pass was then made this time using a combination of the Phenom microcatheter, inside of a 6 [place] Catalyst 135 cm catheter advanced over a 0.014 inch standard Synchro micro guidewire to the distal right M1 segment. The micro guidewire was then advanced into the M2 M3 region of the inferior division followed by the microcatheter. The guidewire was removed. A 4 mm x 40 mm Solitaire X retrieval device was deployed with the Catalyst guide catheter advanced at the origin of inferior division and distal to this. Following proximal flow arrest and constant aspiration for approximately 2 minutes at the hub of the Catalyst guide catheter, the combination of the retrieval device, the microcatheter and the Catalyst catheter were retrieved and removed following reversal of flow arrest. Improved flow was now noted into the inferior division proximally through the two main branch points. Following each thrombectomy, strips of fragments were seen intertwined either in the retrieval device, or in the aspiration catheters. 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 It was therefore  decided to proceed with placement of a rescue stent in order to prevent occlusion of the major right MCA inferior division branch. The microcatheter was again advanced over a 0.014 inch standard Synchro micro guidewire with a 6 [place] Catalyst guide catheter. The microcatheter was advanced over a micro guidewire without difficulty into the M2 region followed by the removal of the micro guidewire. Again good aspiration obtained from the hub of the microcatheter. This was then connected to continuous heparinized saline infusion. Measurements performed of the right middle cerebral artery in the mid M1 segment, and also the proximal inferior division distal to the severe stenosis. A 4 mm x 24 mm Neuroform Atlas stent was advanced to the distal end of the microcatheter. The O ring on the delivery microcatheter was then loosened. With slight forward gentle traction with the right hand on the delivery micro guidewire with left hand the delivery microcatheter was gently retrieved unsheathing the distal and then the proximal portion of the stent with excellent coverage at the site of the severe stenosis. The delivery apparatus was removed. A control arteriogram performed through the balloon guide in the distal cervical ICA on the right demonstrated now significantly improved caliber and flow through the right middle cerebral artery the dominant inferior division. There was poor stagnant flow noted in the proximal superior division. Free flow through the stented segment and also the other branch of the right middle cerebral artery except for the superior division. 8 mg of Integrilin was given intra-arterially in order to prevent intra stent platelet aggregation. A 10 minute post stent arteriogram continued to demonstrate flow through the stented segment in the right MCA distribution except for the non dominant superior division. A TICI 2b revascularization was maintained. The right posterior communicating artery with the  right posterior cerebral artery distribution remained unchanged. Throughout the procedure, the patient's blood pressure and neurological status remained stable. A CT of the brain performed following the third pass demonstrated no mass-effect or midline shift with  mild element of contrast in the right perisylvian region. The balloon guide was then retrieved and removed. The 8 French 25 cm Pinnacle sheath was then exchanged over a 0.035 inch J-tip guidewire for a short 8 [place] Pinnacle sheath. An arteriogram performed through this demonstrated spasm in the previously noted tortuous right common iliac artery. There was small amount of contrast noted in the soft tissue at the site of entry of the 8 [place] Pinnacle sheath which completely disappeared on repeat arteriogram performed approximately 3 minutes later. The 8 French sheath was removed and a 7 [place] ExoSeal closure device was placed. Also minimal compression was held for approximately 25 minutes. The distal pulses remained Dopplerable in the dorsalis pedis, and the posterior tibial regions bilaterally. Patient was left intubated on account of the patient's inability to communicate due to difficulty hearing and also her neurological condition. During this time, while pressure was held, and firmness was felt in the region of the pannus on the right side in the right lower quadrant, and also in the right inguinal region. In view of the unstable blood pressure, the patient was sent to CT scan for CT of the pelvis and also of the abdomen and also CT of the brain. CT of the brain revealed no evidence of mass effect or midline shift with no significant change in the right perisylvian hyperattenuation felt to be due to a contrast stain. CT of the abdomen and pelvis revealed no evidence of the overlying skin tightness. Hemodynamically the patient was now stable with blood pressure in the 150s over 80s and the heart rate being regular in the 80s to 90s sinus rhythm.  Patient was then transferred to the neuro ICU intubated for post thrombectomy management. IMPRESSION: Status post endovascular revascularization of occluded right middle cerebral M1 segment with 4 passes with different stent retrievers and Penumbra aspiration achieving a TICI 2B revascularization. Status post rescue stent placement from the distal M1 segment into the proximal dominant inferior division of the right middle cerebral artery due to underlying severe intracranial arteriosclerosis. PLAN: Follow-up in the clinic 4 weeks post discharge. Electronically Signed   By: Luanne Bras M.D.   On: 12/30/2019 17:15   IR CT Head Ltd  Result Date: 12/31/2019 INDICATION: New onset left-sided hemiplegia, right gaze deviation. Occluded right middle cerebral M1 segment on CT angiogram of the head and neck. EXAM: 1. EMERGENT LARGE VESSEL OCCLUSION THROMBOLYSIS (anterior CIRCULATION) COMPARISON:  CT angiogram of the head and neck of June, 27, 2021. MEDICATIONS: Vancomycin 1 g IV was administered within 1 hour of the procedure. ANESTHESIA/SEDATION: General anesthesia CONTRAST:  Isovue 300 approximately 170 mL FLUOROSCOPY TIME:  Fluoroscopy Time: 131 minutes 12 seconds (4835 mGy). COMPLICATIONS: None immediate. TECHNIQUE: Following a full explanation of the procedure along with the potential associated complications, an informed witnessed consent was obtained the patient's son. The risks of intracranial hemorrhage of 10%, worsening neurological deficit, ventilator dependency, death and inability to revascularize were all reviewed in detail with the patient's son. The patient was then put under general anesthesia by the Department of Anesthesiology at [facility]. The right groin was prepped and draped in the usual sterile fashion. Thereafter using modified Seldinger technique, transfemoral access into the right common femoral artery was obtained without difficulty. Over a 0.035 inch guidewire an 8 French 25 cm  Pinnacle sheath was inserted. Through this, and also over a 0.035 inch guidewire a combination of a select 5 French 125 cm Simmons 2 catheter inside of a  95 cm 087 balloon guide catheter was advanced without difficulty to the right common carotid artery. The guidewire and the select catheter were removed. Good aspiration was obtained from the hub of the balloon guide catheter just proximal to the right common carotid artery bifurcation. Arteriogram was then performed centered extra cranially and intracranially. FINDINGS: The right common carotid arteriogram demonstrates the right external carotid artery and its major branches to be widely patent. The right internal carotid artery at the bulb demonstrates a smooth shallow plaque. Moderate tortuosity was noted of the proximal right internal carotid artery. 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 Distal to this the cervical petrous junction demonstrates normal opacification. Distal to this the petrous, cavernous, and the cavernous segments demonstrate patency as does the supraclinoid segment. Opacification is seen of the right posterior communicating artery opacifying the right posterior cerebral distribution. The right middle cerebral artery demonstrates complete angiographic occlusion at the origin of the anterior temporal branch. Focal areas of caliber irregularity are seen of the proximal right middle cerebral artery. PROCEDURE: Through the 087 balloon guide catheter in the proximal right internal carotid artery, a Zoom 071 135 cm catheter inside of which was an 021 160 cm Phenom microcatheter was advanced over a 0.014 inch standard Synchro micro guidewire gingerly through the right internal carotid artery in the neck and into the  supraclinoid right ICA. The micro guidewire was then gently manipulated with a torque device and advanced into the inferior division M2 M3 region of the right middle cerebral artery followed by the microcatheter. The guidewire was removed. Good aspiration was obtained from the hub of the microcatheter. Gentle contrast injection demonstrated safe position of tip of the microcatheter. A 5 mm x 37 mm Embotrap retrieval device was then advanced to the distal end of the microcatheter. The O ring on the delivery micro guidewire was loosened. With slight forward gentle traction with the right hand on the delivery micro guidewire, with the left hand the retrieval device was deployed. The 071 Zoom catheter was then advanced into the right middle cerebral artery engaging the occluded right middle cerebral artery. With constant flow arrest in the right internal carotid artery, and constant aspiration using a Penumbra device at the hub of the 071 Zoom catheter for approximately 3 minutes, the combination of the retrieval device, the microcatheter, and the Zoom aspiration catheter were retrieved and removed. Following reversal of flow arrest, a control arteriogram performed through the balloon guide catheter in the right internal carotid artery demonstrates revascularization of the right middle cerebral artery and also of the superior and inferior divisions. The anterior temporal branch remains patent. A TICI 2B revascularization was achieved. This also unmasked an irregular filling defect at the right middle cerebral artery terminus at the trifurcation region with flow noted in the distal inferior division. A second pass was then made again with the combination of the 071 Zoom catheter advanced over an 021 160 cm Phenom microcatheter over a 0.014 inch standard Synchro micro guidewire. Again access was obtained into the distal M2 M3 region of the inferior division with the micro guidewire then followed by the microcatheter. The  guidewire was removed. Again safe position of the tip of the microcatheter was confirmed and connected to continuous heparinized saline infusion. A 4 mm x 40 mm Solitaire X retrieval device  was then deployed in the manner described above. Again with proximal flow arrest in the right internal carotid artery proximally, and constant aspiration via the Zoom catheter which was now imbedded in the occluded right middle cerebral artery at its trifurcation region over approximately 2-1/2 minutes, the combination of the retrieval device, the microcatheter, and the Zoom catheter was retrieved and removed. Following reversal of flow arrest, a control arteriogram performed through the right internal carotid artery demonstrated improved opacification of the right MCA trifurcation branches. There continued be occluded superior division. The combination of the microcatheter, inside a 071 Zoom aspiration catheter was again advanced to the right middle cerebral artery over a 0.014 inch standard Synchro micro guidewire, which was now advanced into the superior division of the right middle cerebral artery emanating at the MCA trifurcation region. The microcatheter was advanced to the M2 region over a micro guidewire. The micro guidewire was removed. Good aspiration obtained from the hub of the microcatheter. Again with flow arrest in the right internal carotid artery proximally, and constant aspiration with the Penumbra aspiration device at the hub of the aspiration catheter following deployment of a 3 mm x 20 mm Solitaire X retrieval device, for 2 minutes, the combination of the retrieval device, the microcatheter and the Zoom catheter were retrieved and removed. The superior division continued to be occluded. The superior division was again cannulated with a microcatheter over a micro guidewire as described above. After having verified safe position of the tip of the microcatheter, in the superior division M2 region, a 4 mm x 40 mm  Solitaire X retrieval device was deployed in the usual manner. Again with proximal flow arrest in the proximal right ICA, and constant aspiration at the hub of the Zoom catheter at the site of the occluded superior division in the distal right middle cerebral M1 segment over 2 minutes, the combination of the retrieval device, the microcatheter and the retrieval device were retrieved and removed. Following reverse of flow arrest, there appeared to be modest improved flow in the proximal superior division. It was now noted the inferior division had a near occlusive clot in the proximal aspect. A fourth pass was then made this time using a combination of the Phenom microcatheter, inside of a 6 [place] Catalyst 135 cm catheter advanced over a 0.014 inch standard Synchro micro guidewire to the distal right M1 segment. The micro guidewire was then advanced into the M2 M3 region of the inferior division followed by the microcatheter. The guidewire was removed. A 4 mm x 40 mm Solitaire X retrieval device was deployed with the Catalyst guide catheter advanced at the origin of inferior division and distal to this. Following proximal flow arrest and constant aspiration for approximately 2 minutes at the hub of the Catalyst guide catheter, the combination of the retrieval device, the microcatheter and the Catalyst catheter were retrieved and removed following reversal of flow arrest. Improved flow was now noted into the inferior division proximally through the two main branch points. Following each thrombectomy, strips of fragments were seen intertwined either in the retrieval device, or in the aspiration catheters. 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 It was therefore decided to proceed with placement of a rescue stent in order to prevent occlusion of the major right MCA inferior division branch. The microcatheter was again advanced over a 0.014  inch standard Synchro micro guidewire with a 6 [place] Catalyst guide catheter. The microcatheter was advanced  over a micro guidewire without difficulty into the M2 region followed by the removal of the micro guidewire. Again good aspiration obtained from the hub of the microcatheter. This was then connected to continuous heparinized saline infusion. Measurements performed of the right middle cerebral artery in the mid M1 segment, and also the proximal inferior division distal to the severe stenosis. A 4 mm x 24 mm Neuroform Atlas stent was advanced to the distal end of the microcatheter. The O ring on the delivery microcatheter was then loosened. With slight forward gentle traction with the right hand on the delivery micro guidewire with left hand the delivery microcatheter was gently retrieved unsheathing the distal and then the proximal portion of the stent with excellent coverage at the site of the severe stenosis. The delivery apparatus was removed. A control arteriogram performed through the balloon guide in the distal cervical ICA on the right demonstrated now significantly improved caliber and flow through the right middle cerebral artery the dominant inferior division. There was poor stagnant flow noted in the proximal superior division. Free flow through the stented segment and also the other branch of the right middle cerebral artery except for the superior division. 8 mg of Integrilin was given intra-arterially in order to prevent intra stent platelet aggregation. A 10 minute post stent arteriogram continued to demonstrate flow through the stented segment in the right MCA distribution except for the non dominant superior division. A TICI 2b revascularization was maintained. The right posterior communicating artery with the right posterior cerebral artery distribution remained unchanged. Throughout the procedure, the patient's blood pressure and neurological status remained stable. A CT of the brain  performed following the third pass demonstrated no mass-effect or midline shift with mild element of contrast in the right perisylvian region. The balloon guide was then retrieved and removed. The 8 French 25 cm Pinnacle sheath was then exchanged over a 0.035 inch J-tip guidewire for a short 8 [place] Pinnacle sheath. An arteriogram performed through this demonstrated spasm in the previously noted tortuous right common iliac artery. There was small amount of contrast noted in the soft tissue at the site of entry of the 8 [place] Pinnacle sheath which completely disappeared on repeat arteriogram performed approximately 3 minutes later. The 8 French sheath was removed and a 7 [place] ExoSeal closure device was placed. Also minimal compression was held for approximately 25 minutes. The distal pulses remained Dopplerable in the dorsalis pedis, and the posterior tibial regions bilaterally. Patient was left intubated on account of the patient's inability to communicate due to difficulty hearing and also her neurological condition. During this time, while pressure was held, and firmness was felt in the region of the pannus on the right side in the right lower quadrant, and also in the right inguinal region. In view of the unstable blood pressure, the patient was sent to CT scan for CT of the pelvis and also of the abdomen and also CT of the brain. CT of the brain revealed no evidence of mass effect or midline shift with no significant change in the right perisylvian hyperattenuation felt to be due to a contrast stain. CT of the abdomen and pelvis revealed no evidence of the overlying skin tightness. Hemodynamically the patient was now stable with blood pressure in the 150s over 80s and the heart rate being regular in the 80s to 90s sinus rhythm. Patient was then transferred to the neuro ICU intubated for post thrombectomy management. IMPRESSION: Status post endovascular revascularization of occluded right middle  cerebral M1  segment with 4 passes with different stent retrievers and Penumbra aspiration achieving a TICI 2B revascularization. Status post rescue stent placement from the distal M1 segment into the proximal dominant inferior division of the right middle cerebral artery due to underlying severe intracranial arteriosclerosis. PLAN: Follow-up in the clinic 4 weeks post discharge. Electronically Signed   By: Luanne Bras M.D.   On: 12/30/2019 17:15   DG CHEST PORT 1 VIEW  Result Date: 12/31/2019 CLINICAL DATA:  Status post extubation EXAM: PORTABLE CHEST 1 VIEW COMPARISON:  12/30/2019 FINDINGS: Cardiac shadow remains enlarged. Aortic calcifications are again seen. Feeding catheter is noted extending into the stomach. Endotracheal tube has been removed in the interval. The lungs are well aerated without focal infiltrate or sizable effusion. IMPRESSION: Status post extubation. No acute abnormality noted. Electronically Signed   By: Inez Catalina M.D.   On: 12/31/2019 11:27   DG Chest Port 1 View  Result Date: 12/30/2019 CLINICAL DATA:  Intubation. EXAM: PORTABLE CHEST 1 VIEW COMPARISON:  Radiograph 11/08/2019. FINDINGS: Low positioning of the endotracheal tube 6 mm from the carina. Enteric tube in place with tip below the diaphragm not included in this chest field of view. Stable cardiomegaly. Unchanged mediastinal contours. Interstitial coarsening which appears chronic. No focal airspace disease, pleural effusion, or pneumothorax. No acute osseous abnormalities are seen. IMPRESSION: 1. Low positioning of the endotracheal tube 6 mm from the carina. Retraction of 2-3 cm recommended. 2. Enteric tube in place with tip below the diaphragm. 3. Stable cardiomegaly and chronic interstitial coarsening. Electronically Signed   By: Keith Rake M.D.   On: 12/30/2019 01:11   DG Abd Portable 1V  Result Date: 12/30/2019 CLINICAL DATA:  OG tube placement. EXAM: PORTABLE ABDOMEN - 1 VIEW COMPARISON:  Abdomen pelvis CT  yesterday. FINDINGS: Tip and side port of the enteric tube below the diaphragm in the stomach. Excreted IV contrast within both renal collecting systems and urinary bladder. Bilateral ureteral stents in place. Nephrograms demonstrate mild hydronephrosis. Nonobstructive bowel gas pattern. IMPRESSION: 1. Tip and side port of the enteric tube below the diaphragm in the stomach. 2. Bilateral ureteral stents in place. Excreted IV contrast in both renal collecting systems in the urinary bladder with mild bilateral hydronephrosis. Electronically Signed   By: Keith Rake M.D.   On: 12/30/2019 01:13   VAS [place] GROIN PSEUDOANEURYSM  Result Date: 01/01/2020  ARTERIAL PSEUDOANEURYSM  Exam: Right groin Indications: Patient complains of bruising and drop in hemoglobin. History: S/p catheterization. Comparison Study: 12/30/19 negative pseudoaneurysm/ hematoma Performing Technologist: June Leap RDMS, RVT  Examination Guidelines: A complete evaluation includes B-mode imaging, spectral Doppler, color Doppler, and power Doppler as needed of all accessible portions of each vessel. Bilateral testing is considered an integral part of a complete examination. Limited examinations for reoccurring indications may be performed as noted. +------------+----------+---------+------+----------+ Right DuplexPSV (cm/s)Waveform PlaqueComment(s) +------------+----------+---------+------+----------+ CFA            140    triphasic                 +------------+----------+---------+------+----------+ Right Vein comments:patent CFV  Findings: No hematoma visualized  Summary: No evidence of pseudoaneurysm, AVF or DVT  Diagnosing physician: Harold Barban MD Electronically signed by Harold Barban MD on 01/01/2020 at 8:52:29 PM.   --------------------------------------------------------------------------------    Final    VAS [place] GROIN PSEUDOANEURYSM  Result Date: 12/31/2019  ARTERIAL PSEUDOANEURYSM  Exam: Right groin Indications:  Patient complains of bruising. Comparison Study: no prior Performing Technologist: Jinny Blossom  Riddle RVS  Examination Guidelines: A complete evaluation includes B-mode imaging, spectral Doppler, color Doppler, and power Doppler as needed of all accessible portions of each vessel. Bilateral testing is considered an integral part of a complete examination. Limited examinations for reoccurring indications may be performed as noted. +------------+----------+--------+------+----------+ Right DuplexPSV (cm/s)WaveformPlaqueComment(s) +------------+----------+--------+------+----------+ CFA            147    biphasic                 +------------+----------+--------+------+----------+ Prox SFA       146    biphasic                 +------------+----------+--------+------+----------+  Summary: No evidence of pseudoaneurysm, AVF or DVT  Diagnosing physician: Deitra Mayo MD Electronically signed by Deitra Mayo MD on 12/31/2019 at 8:54:10 AM.   --------------------------------------------------------------------------------    Final    IR PERCUTANEOUS ART THROMBECTOMY/INFUSION INTRACRANIAL INC DIAG ANGIO  Result Date: 12/31/2019 INDICATION: New onset left-sided hemiplegia, right gaze deviation. Occluded right middle cerebral M1 segment on CT angiogram of the head and neck. EXAM: 1. EMERGENT LARGE VESSEL OCCLUSION THROMBOLYSIS (anterior CIRCULATION) COMPARISON:  CT angiogram of the head and neck of June, 27, 2021. MEDICATIONS: Vancomycin 1 g IV was administered within 1 hour of the procedure. ANESTHESIA/SEDATION: General anesthesia CONTRAST:  Isovue 300 approximately 170 mL FLUOROSCOPY TIME:  Fluoroscopy Time: 131 minutes 12 seconds (4835 mGy). COMPLICATIONS: None immediate. TECHNIQUE: Following a full explanation of the procedure along with the potential associated complications, an informed witnessed consent was obtained the patient's son. The risks of intracranial hemorrhage of 10%, worsening  neurological deficit, ventilator dependency, death and inability to revascularize were all reviewed in detail with the patient's son. The patient was then put under general anesthesia by the Department of Anesthesiology at [facility]. The right groin was prepped and draped in the usual sterile fashion. Thereafter using modified Seldinger technique, transfemoral access into the right common femoral artery was obtained without difficulty. Over a 0.035 inch guidewire an 8 French 25 cm Pinnacle sheath was inserted. Through this, and also over a 0.035 inch guidewire a combination of a select 5 French 125 cm Simmons 2 catheter inside of a 95 cm 087 balloon guide catheter was advanced without difficulty to the right common carotid artery. The guidewire and the select catheter were removed. Good aspiration was obtained from the hub of the balloon guide catheter just proximal to the right common carotid artery bifurcation. Arteriogram was then performed centered extra cranially and intracranially. FINDINGS: The right common carotid arteriogram demonstrates the right external carotid artery and its major branches to be widely patent. The right internal carotid artery at the bulb demonstrates a smooth shallow plaque. Moderate tortuosity was noted of the proximal right internal carotid artery. 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 Distal to this the cervical petrous junction demonstrates normal opacification. Distal to this the petrous, cavernous, and the cavernous segments demonstrate patency as does the supraclinoid segment. Opacification is seen of the right posterior communicating artery opacifying the right posterior cerebral distribution. The right middle cerebral artery demonstrates  complete angiographic occlusion at the origin of the anterior temporal branch. Focal areas of caliber irregularity are seen of the proximal right middle cerebral artery. PROCEDURE: Through the 087 balloon guide catheter in the proximal  right internal carotid artery, a Zoom 071 135 cm catheter inside of which was an 021 160 cm Phenom microcatheter was advanced over a 0.014 inch standard Synchro micro guidewire gingerly through the right internal carotid artery in the neck and into the supraclinoid right ICA. The micro guidewire was then gently manipulated with a torque device and advanced into the inferior division M2 M3 region of the right middle cerebral artery followed by the microcatheter. The guidewire was removed. Good aspiration was obtained from the hub of the microcatheter. Gentle contrast injection demonstrated safe position of tip of the microcatheter. A 5 mm x 37 mm Embotrap retrieval device was then advanced to the distal end of the microcatheter. The O ring on the delivery micro guidewire was loosened. With slight forward gentle traction with the right hand on the delivery micro guidewire, with the left hand the retrieval device was deployed. The 071 Zoom catheter was then advanced into the right middle cerebral artery engaging the occluded right middle cerebral artery. With constant flow arrest in the right internal carotid artery, and constant aspiration using a Penumbra device at the hub of the 071 Zoom catheter for approximately 3 minutes, the combination of the retrieval device, the microcatheter, and the Zoom aspiration catheter were retrieved and removed. Following reversal of flow arrest, a control arteriogram performed through the balloon guide catheter in the right internal carotid artery demonstrates revascularization of the right middle cerebral artery and also of the superior and inferior divisions. The anterior temporal branch remains patent. A TICI 2B revascularization was achieved. This  also unmasked an irregular filling defect at the right middle cerebral artery terminus at the trifurcation region with flow noted in the distal inferior division. A second pass was then made again with the combination of the 071 Zoom catheter advanced over an 021 160 cm Phenom microcatheter over a 0.014 inch standard Synchro micro guidewire. Again access was obtained into the distal M2 M3 region of the inferior division with the micro guidewire then followed by the microcatheter. The guidewire was removed. Again safe position of the tip of the microcatheter was confirmed and connected to continuous heparinized saline infusion. A 4 mm x 40 mm Solitaire X retrieval device was then deployed in the manner described above. Again with proximal flow arrest in the right internal carotid artery proximally, and constant aspiration via the Zoom catheter which was now imbedded in the occluded right middle cerebral artery at its trifurcation region over approximately 2-1/2 minutes, the combination of the retrieval device, the microcatheter, and the Zoom catheter was retrieved and removed. Following reversal of flow arrest, a control arteriogram performed through the right internal carotid artery demonstrated improved opacification of the right MCA trifurcation branches. There continued be occluded superior division. The combination of the microcatheter, inside a 071 Zoom aspiration catheter was again advanced to the right middle cerebral artery over a 0.014 inch standard Synchro micro guidewire, which was now advanced into the superior division of the right middle cerebral artery emanating at the MCA trifurcation region. The microcatheter was advanced to the M2 region over a micro guidewire. The micro guidewire was removed. Good aspiration obtained from the hub of the microcatheter. Again with flow arrest in the right internal carotid artery proximally, and constant aspiration with the Penumbra aspiration device at the hub of  the aspiration catheter following deployment of a 3 mm x 20 mm Solitaire X retrieval device, for 2 minutes, the combination of the retrieval device, the microcatheter and the Zoom  catheter were retrieved and removed. The superior division continued to be occluded. The superior division was again cannulated with a microcatheter over a micro guidewire as described above. After having verified safe position of the tip of the microcatheter, in the superior division M2 region, a 4 mm x 40 mm Solitaire X retrieval device was deployed in the usual manner. Again with proximal flow arrest in the proximal right ICA, and constant aspiration at the hub of the Zoom catheter at the site of the occluded superior division in the distal right middle cerebral M1 segment over 2 minutes, the combination of the retrieval device, the microcatheter and the retrieval device were retrieved and removed. Following reverse of flow arrest, there appeared to be modest improved flow in the proximal superior division. It was now noted the inferior division had a near occlusive clot in the proximal aspect. A fourth pass was then made this time using a combination of the Phenom microcatheter, inside of a 6 [place] Catalyst 135 cm catheter advanced over a 0.014 inch standard Synchro micro guidewire to the distal right M1 segment. The micro guidewire was then advanced into the M2 M3 region of the inferior division followed by the microcatheter. The guidewire was removed. A 4 mm x 40 mm Solitaire X retrieval device was deployed with the Catalyst guide catheter advanced at the origin of inferior division and distal to this. Following proximal flow arrest and constant aspiration for approximately 2 minutes at the hub of the Catalyst guide catheter, the combination of the retrieval device, the microcatheter and the Catalyst catheter were retrieved and removed following reversal of flow arrest. Improved flow was now noted into the inferior division  proximally through the two main branch points. Following each thrombectomy, strips of fragments were seen intertwined either in the retrieval device, or in the aspiration catheters. 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 It was therefore decided to proceed with placement of a rescue stent in order to prevent occlusion of the major right MCA inferior division branch. The microcatheter was again advanced over a 0.014 inch standard Synchro micro guidewire with a 6 [place] Catalyst guide catheter. The microcatheter was advanced over a micro guidewire without difficulty into the M2 region followed by the removal of the micro guidewire. Again good aspiration obtained from the hub of the microcatheter. This was then connected to continuous heparinized saline infusion. Measurements performed of the right middle cerebral artery in the mid M1 segment, and also the proximal inferior division distal to the severe stenosis. A 4 mm x 24 mm Neuroform Atlas stent was advanced to the distal end of the microcatheter. The O ring on the delivery microcatheter was then loosened. With slight forward gentle traction with the right hand on the delivery micro guidewire with left hand the delivery microcatheter was gently retrieved unsheathing the distal and then the proximal portion of the stent with excellent coverage at the site of the severe stenosis. The delivery apparatus was removed. A control arteriogram performed through the balloon guide in the distal cervical ICA on the right demonstrated now significantly improved caliber and flow through the right middle cerebral artery the dominant inferior division. There was poor stagnant flow noted in the proximal superior division. Free flow through the stented segment and also the other branch of the right middle cerebral artery except for the superior division. 8 mg of Integrilin was given intra-arterially in  order to prevent intra stent platelet aggregation.  A 10 minute post stent arteriogram continued to demonstrate flow through the stented segment in the right MCA distribution except for the non dominant superior division. A TICI 2b revascularization was maintained. The right posterior communicating artery with the right posterior cerebral artery distribution remained unchanged. Throughout the procedure, the patient's blood pressure and neurological status remained stable. A CT of the brain performed following the third pass demonstrated no mass-effect or midline shift with mild element of contrast in the right perisylvian region. The balloon guide was then retrieved and removed. The 8 French 25 cm Pinnacle sheath was then exchanged over a 0.035 inch J-tip guidewire for a short 8 [place] Pinnacle sheath. An arteriogram performed through this demonstrated spasm in the previously noted tortuous right common iliac artery. There was small amount of contrast noted in the soft tissue at the site of entry of the 8 [place] Pinnacle sheath which completely disappeared on repeat arteriogram performed approximately 3 minutes later. The 8 French sheath was removed and a 7 [place] ExoSeal closure device was placed. Also minimal compression was held for approximately 25 minutes. The distal pulses remained Dopplerable in the dorsalis pedis, and the posterior tibial regions bilaterally. Patient was left intubated on account of the patient's inability to communicate due to difficulty hearing and also her neurological condition. During this time, while pressure was held, and firmness was felt in the region of the pannus on the right side in the right lower quadrant, and also in the right inguinal region. In view of the unstable blood pressure, the patient was sent to CT scan for CT of the pelvis and also of the abdomen and also CT of the brain. CT of the brain revealed no evidence of mass effect or midline shift with no significant change  in the right perisylvian hyperattenuation felt to be due to a contrast stain. CT of the abdomen and pelvis revealed no evidence of the overlying skin tightness. Hemodynamically the patient was now stable with blood pressure in the 150s over 80s and the heart rate being regular in the 80s to 90s sinus rhythm. Patient was then transferred to the neuro ICU intubated for post thrombectomy management. IMPRESSION: Status post endovascular revascularization of occluded right middle cerebral M1 segment with 4 passes with different stent retrievers and Penumbra aspiration achieving a TICI 2B revascularization. Status post rescue stent placement from the distal M1 segment into the proximal dominant inferior division of the right middle cerebral artery due to underlying severe intracranial arteriosclerosis. PLAN: Follow-up in the clinic 4 weeks post discharge. Electronically Signed   By: Luanne Bras M.D.   On: 12/30/2019 17:15   CT HEAD CODE STROKE WO CONTRAST  Result Date: 12/29/2019 CLINICAL DATA:  Code stroke.  Left-sided weakness EXAM: CT HEAD WITHOUT CONTRAST TECHNIQUE: Contiguous axial images were obtained from the base of the skull through the vertex without intravenous contrast. COMPARISON:  None. FINDINGS: Brain: There is no acute intracranial hemorrhage or mass effect. Gray-white differentiation is preserved. Ventricles and sulci are within normal limits in size and configuration. Patchy hypoattenuation in the supratentorial white matter is nonspecific but probably reflects chronic microvascular ischemic changes. Extra-axial dural-based hyperdense lesion along the high left frontal convexity and falx measuring 2.6 x 1.8 x 2.3 cm is consistent with a meningioma. There is no apparent underlying edema. This abuts the superior sagittal sinus. Vascular: No hyperdense vessel. Mild intracranial atherosclerotic calcification at the skull base. Skull: Unremarkable. Sinuses/Orbits: No acute abnormality. Other:  Mastoid air cells are clear. ASPECTS (  [place] Stroke Program Early CT Score) - Ganglionic level infarction (caudate, lentiform nuclei, internal capsule, insula, M1-M3 cortex): 7 - Supraganglionic infarction (M4-M6 cortex): 3 Total score (0-10 with 10 being normal): 10 IMPRESSION: No acute intracranial hemorrhage or evidence of acute infarction. ASPECT score is 10. Chronic microvascular ischemic changes. Left frontal meningioma without significant mass effect or underlying edema. Initial results were communicated to Dr. Lorraine Lax at [place] 6/27/2021by text page via the [facility] messaging system. Electronically Signed   By: Macy Mis M.D.   On: 12/29/2019 18:20   ECHOCARDIOGRAM LIMITED  Result Date: 12/30/2019    ECHOCARDIOGRAM LIMITED REPORT   Patient Name:   Gloria [name] Date of Exam: 12/30/2019 Medical Rec #:  [id number]           Height:       62.0 in Accession #:    [id number]          Weight:       211.0 lb Date of Birth:  1945/12/04            BSA:          1.956 m Patient Age:    47 years            BP:           132/80 mmHg Patient Gender: F                   HR:           64 bpm. Exam Location:  Inpatient Procedure: Limited Color Doppler, Cardiac Doppler and Limited Echo Indications:    stroke 434.91  History:        Patient has prior history of Echocardiogram examinations, most                 recent 11/09/2019. Arrythmias:Atrial Fibrillation.  Sonographer:    Johny Chess Referring Phys: [id number] [place]  1. Left ventricular ejection fraction, by estimation, is >75%. The left ventricle has hyperdynamic function. The left ventricle has no regional wall motion abnormalities. There is severe left ventricular hypertrophy. Left ventricular diastolic parameters are indeterminate.  2. Right ventricular systolic function is normal. The right ventricular size is normal. There is mildly elevated pulmonary artery systolic pressure.  3. Tricuspid valve regurgitation is mild to  moderate.  4. The aortic valve is abnormal. Aortic valve regurgitation is mild to moderate. Mild aortic valve sclerosis is present, with no evidence of aortic valve stenosis.  5. The inferior vena cava is normal in size with greater than 50% respiratory variability, suggesting right atrial pressure of 3 mmHg. FINDINGS  Left Ventricle: Left ventricular ejection fraction, by estimation, is >75%. The left ventricle has hyperdynamic function. The left ventricle has no regional wall motion abnormalities. The left ventricular internal cavity size was small. There is severe left ventricular hypertrophy. Right Ventricle: The right ventricular size is normal. No increase in right ventricular wall thickness. Right ventricular systolic function is normal. There is mildly elevated pulmonary artery systolic pressure. The tricuspid regurgitant velocity is 3.14  m/s, and with an assumed right atrial pressure of 3 mmHg, the estimated right ventricular systolic pressure is 80.8 mmHg. Pericardium: Trivial pericardial effusion is present. Mitral Valve: The mitral valve is abnormal. There is mild thickening of the mitral valve leaflet(s). Moderate mitral annular calcification. Tricuspid Valve: The tricuspid valve is grossly normal. Tricuspid valve regurgitation is mild to moderate. Aortic Valve: The aortic valve is abnormal. Aortic valve regurgitation is mild  to moderate. Mild aortic valve sclerosis is present, with no evidence of aortic valve stenosis. Pulmonic Valve: The pulmonic valve was normal in structure. Pulmonic valve regurgitation is not visualized. Aorta: The aortic root and ascending aorta are structurally normal, with no evidence of dilitation. Venous: The inferior vena cava is normal in size with greater than 50% respiratory variability, suggesting right atrial pressure of 3 mmHg.  LEFT VENTRICLE PLAX 2D LVIDd:         3.10 cm LVIDs:         1.60 cm LV PW:         1.70 cm LV IVS:        1.60 cm LVOT diam:     1.90 cm LVOT  Area:     2.84 cm  IVC IVC diam: 1.90 cm LEFT ATRIUM         Index LA diam:    4.70 cm 2.40 cm/m   AORTA Ao Root diam: 3.50 cm Ao Asc diam:  3.50 cm TRICUSPID VALVE TR Peak grad:   39.4 mmHg TR Vmax:        314.00 cm/s  SHUNTS Systemic Diam: 1.90 cm Dorris Carnes MD Electronically signed by Dorris Carnes MD Signature Date/Time: 12/30/2019/7:38:12 PM    Final     Labs:  CBC: Recent Labs    12/30/19 0500 12/30/19 0500 12/31/19 0342 01/01/20 0615 01/01/20 1446 01/02/20 0535  WBC 7.8  --  10.4 6.0  --  5.1  HGB 8.4*   < > 7.8* 7.3* 8.7* 8.6*  HCT 26.6*   < > 24.7* 23.5* 27.3* 27.2*  PLT 200  --  223 157  --  175   < > = values in this interval not displayed.    COAGS: Recent Labs    12/29/19 1802  INR 1.1  APTT 25    BMP: Recent Labs    12/30/19 0500 12/31/19 0342 01/01/20 0615 01/02/20 0535  NA 141 141 142 141  K 4.5 4.7 4.3 4.2  CL 113* 112* 112* 108  CO2 22 19* 22 25  GLUCOSE 149* 103* 125* 119*  BUN 28* 33* 32* 31*  CALCIUM 7.9* 8.0* 8.1* 8.1*  CREATININE 1.44* 1.88* 1.64* 1.49*  GFRNONAA 36* 26* 30* 34*  GFRAA 41* 30* 35* 40*    LIVER FUNCTION TESTS: Recent Labs    11/08/19 1722 11/08/19 1722 11/09/19 0431 11/10/19 0455 11/11/19 0442 12/29/19 1802  BILITOT 0.6  --   --  0.3 0.3 0.6  AST 14*  --   --  13* 13* 17  ALT 14  --   --  13 14 11   ALKPHOS 66  --   --  57 54 82  PROT 6.9  --   --  6.1* 6.2* 6.2*  ALBUMIN 3.3*   < > 3.0* 2.7* 2.8* 3.2*   < > = values in this interval not displayed.    Assessment and Plan: History of acute CVA s/p cerebral arteriogram with emergent mechanical thrombectomy of right MCA M1 occlusion achieving a TICI 2b revascularization, along with revascularization of right MCA stenosis using rescue stent placement via right femoral approach 12/29/2019 by Dr. Estanislado Pandy. Patient's condition stable Resting in bed with NGT in place.  Does not track with eyes.  No verbalization except when assessed left knee which does appear swollen  and bruised today.  Right groin puncture site with tender palpable hematoma Doppler did not reveal any hematoma or pseudoaneurysm. Continue taking Brilinta 90 mg twice daily and  Aspirin 81 mg once daily. Discussed findings of knee swelling/bruising with RN.  Further plans per neurology/CCM- appreciate and agree with management. NIR to follow.     Electronically Signed: Docia Barrier, PA 01/02/2020, 10:24 AM   I spent a total of 25 Minutes at the the patient's bedside AND on the patient's hospital floor or unit, greater than 50% of which was counseling/coordinating care for CVA s/p revascularization.

## 2020-01-02 NOTE — Progress Notes (Signed)
Left lower extremity venous duplex has been completed. Preliminary results can be found in CV Proc through chart review.   01/02/20 3:46 PM Gloria [name] RVT

## 2020-01-03 ENCOUNTER — Inpatient Hospital Stay (HOSPITAL_COMMUNITY): Payer: PPO

## 2020-01-03 LAB — BASIC METABOLIC PANEL
Anion gap: 9 (ref 5–15)
BUN: 31 mg/dL — ABNORMAL HIGH (ref 8–23)
CO2: 23 mmol/L (ref 22–32)
Calcium: 8.4 mg/dL — ABNORMAL LOW (ref 8.9–10.3)
Chloride: 108 mmol/L (ref 98–111)
Creatinine, Ser: 1.32 mg/dL — ABNORMAL HIGH (ref 0.44–1.00)
GFR calc Af Amer: 46 mL/min — ABNORMAL LOW (ref >60)
GFR calc non Af Amer: 40 mL/min — ABNORMAL LOW (ref >60)
Glucose, Bld: 126 mg/dL — ABNORMAL HIGH (ref 70–99)
Potassium: 4.4 mmol/L (ref 3.5–5.1)
Sodium: 140 mmol/L (ref 135–145)

## 2020-01-03 LAB — CBC
HCT: 29.4 % — ABNORMAL LOW (ref 36.0–46.0)
Hemoglobin: 9.3 g/dL — ABNORMAL LOW (ref 12.0–15.0)
MCH: 32.6 pg (ref 26.0–34.0)
MCHC: 31.6 g/dL (ref 30.0–36.0)
MCV: 103.2 fL — ABNORMAL HIGH (ref 80.0–100.0)
Platelets: 212 10*3/uL (ref 150–400)
RBC: 2.85 MIL/uL — ABNORMAL LOW (ref 3.87–5.11)
RDW: 13.9 % (ref 11.5–15.5)
WBC: 5.2 10*3/uL (ref 4.0–10.5)
nRBC: 0 % (ref 0.0–0.2)

## 2020-01-03 LAB — GLUCOSE, CAPILLARY
Glucose-Capillary: 89 mg/dL (ref 70–99)
Glucose-Capillary: 136 mg/dL — ABNORMAL HIGH (ref 70–99)
Glucose-Capillary: 108 mg/dL — ABNORMAL HIGH (ref 70–99)
Glucose-Capillary: 124 mg/dL — ABNORMAL HIGH (ref 70–99)
Glucose-Capillary: 121 mg/dL — ABNORMAL HIGH (ref 70–99)
Glucose-Capillary: 118 mg/dL — ABNORMAL HIGH (ref 70–99)

## 2020-01-03 MED ORDER — RESOURCE THICKENUP CLEAR PO POWD
ORAL | Status: DC | PRN
  Filled 2020-01-03: qty 125

## 2020-01-03 MED ORDER — JEVITY 1.2 CAL PO LIQD
1000.0000 mL | ORAL | Status: DC
  Administered 2020-01-05: 1000 mL
  Filled 2020-01-03 (×3): qty 1000

## 2020-01-03 MED ORDER — PRO-STAT SUGAR FREE PO LIQD
30.0000 mL | Freq: Two times a day (BID) | ORAL | Status: DC
  Administered 2020-01-03 – 2020-01-05 (×4): 30 mL via ORAL
  Filled 2020-01-03 (×6): qty 30

## 2020-01-03 NOTE — Progress Notes (Signed)
Physical Therapy Treatment Patient Details Name: Gloria [name] MRN: [id number] DOB: [birth year] Today's Date: 01/03/2020    History of Present Illness This 74 y.o. female admitted with flaccid Lt side and Rt gaze preference.  Pt received tPA.  CTA revealed Rt M1 occlusion and she was taken for emergent thrombectomy.  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  She was extubated 12/31/2019. PMH:  Pt is very HOH - reads lips, HTN, h/o kidney stones, A-Fib; s/p multiple cystocopies with stent placement.  x-rays of L LE were negative for fx, confirmed L knee OA    PT Comments    Pt was able to sit EOB with me and one person max assist for transitions.  Two people needed to reposition her higher in the bed.  Once seated she could be as good as mod assist and pushes to the left with her strong R arm.  Once R arm was released, she was able to be light mod assist.  She is still very rotated to the right, so some time spent once back in supine on cervical spine left rotation ROM.  PT will continue to follow acutely for safe mobility progression.     Follow Up Recommendations  CIR     Equipment Recommendations  Wheelchair (measurements PT);Wheelchair cushion (measurements PT);Hospital bed;Other (comment) (hoyer lift)    Recommendations for Other Services Rehab consult     Precautions / Restrictions Precautions Precautions: Fall;Other (comment) Precaution Comments: Rt gaze preference with Lt neglect, essentially deaf Required Braces or Orthoses: Other Brace Other Brace: does best with glasses donned and clear facemask so she can read your lips (clear masks in cubby outside of room)    Mobility  Bed Mobility Overal bed mobility: Needs Assistance Bed Mobility: Supine to Sit;Sit to Supine     Supine to sit: Max assist;HOB elevated Sit to supine: Max  assist;HOB elevated   General bed mobility comments: Max assist to come to sitting R side of bed from fully upright HOB.  Pt holding to rail and therapist assisting in progressing bil legs and trunk up  Transfers                 General transfer comment: I did not lift her OOB to the recliner chair today as she was soon to leave for a modified barium swallow test.   Ambulation/Gait             General Gait Details: unable at this time.        Modified Rankin (Stroke Patients Only) Modified Rankin (Stroke Patients Only) Pre-Morbid Rankin Score: Slight disability Modified Rankin: Severe disability     Balance Overall balance assessment: Needs assistance Sitting-balance support: Feet supported;Single extremity supported Sitting balance-Leahy Scale: Zero Sitting balance - Comments: up to max assist due to pushing to the left with strong R hand in sitting.  Once right hand disengaged, pt could be as light as mod assist in sitting EOB.  Pt tolerated EOB well for ~10 mins, unable to keep salava in her mouth in fully upright sitting.  Postural control: Left lateral lean (left lateral push. )                                  Cognition Arousal/Alertness: Awake/alert Behavior During Therapy: WFL for  tasks assessed/performed Overall Cognitive Status: Impaired/Different from baseline Area of Impairment: Safety/judgement;Awareness                   Current Attention Level: Sustained     Safety/Judgement: Decreased awareness of deficits;Decreased awareness of safety Awareness: Intellectual   General Comments: Pt continues to have significant L neglect, cognition difficult to assess due to nearly deaf pt who reads lips.  She has no awarenss of her left sided weakness, inability to walk.  At times she thinks she is at home.  She responds most briskly to noxious stimuli.  She asks for a tissue for her mouth while MD in room.          General Comments  General comments (skin integrity, edema, etc.): Pt left in high chiar mode, but transporter coming in to take her to swallow study.       Pertinent Vitals/Pain Pain Assessment: Faces Faces Pain Scale: Hurts even more Pain Location: bil knee pain Pain Descriptors / Indicators: Grimacing;Guarding Pain Intervention(s): Limited activity within patient's tolerance;Monitored during session;Repositioned           PT Goals (current goals can now be found in the care plan section) Acute Rehab PT Goals Patient Stated Goal: to go home Progress towards PT goals: Progressing toward goals    Frequency    Min 4X/week      PT Plan Current plan remains appropriate       AM-PAC PT "6 Clicks" Mobility   Outcome Measure  Help needed turning from your back to your side while in a flat bed without using bedrails?: Total Help needed moving from lying on your back to sitting on the side of a flat bed without using bedrails?: Total Help needed moving to and from a bed to a chair (including a wheelchair)?: Total Help needed standing up from a chair using your arms (e.g., wheelchair or bedside chair)?: Total Help needed to walk in hospital room?: Total Help needed climbing 3-5 steps with a railing? : Total 6 Click Score: 6    End of Session   Activity Tolerance: Patient limited by pain Patient left: in bed;with call bell/phone within reach;with bed alarm set Nurse Communication: Mobility status;Need for lift equipment PT Visit Diagnosis: Muscle weakness (generalized) (M62.81);Difficulty in walking, not elsewhere classified (R26.2);Hemiplegia and hemiparesis Hemiplegia - Right/Left: Left Hemiplegia - dominant/non-dominant: Non-dominant Hemiplegia - caused by: Cerebral infarction     Time: 0272-5366 PT Time Calculation (min) (ACUTE ONLY): 28 min  Charges:  $Therapeutic Activity: 8-22 mins $Neuromuscular Re-education: 8-22 mins              Verdene Lennert, PT, DPT  Acute  Rehabilitation #[phone] pager #[phone] office             01/03/2020, 3:55 PM

## 2020-01-03 NOTE — Progress Notes (Addendum)
Modified Barium Swallow Progress Note  Patient Details  Name: Gloria [name] MRN: [id number] Date of Birth: 1946-01-22  Today's Date: 01/03/2020  Modified Barium Swallow completed.  Full report located under Chart Review in the Imaging Section.  Brief recommendations include the following:  Clinical Impression  Pt presents with a moderate oral and more mild pharyngeal dysphagia. She has decreased bolus cohesion and oral clearance across consistencies. There is anterior loss of solids, unmasticated and without evidence of awareness. Labial seal with liquids is best via straw. Thin liquids spill prematurely into the pharynx and sit briefly in the larynx and pyriform sinuses before a swallow is triggered. Aspiration occurs intermittently and silently, and cues to cough are difficult given her hearing loss despite use of clear masks and glasses for lipreading and visual cues. She initially only has penetration while using a chin tuck, but since it cannot be cleared to command, this ultimately results in aspiration as well. She has improved oral control and containment at the valleculae with nectar thick liquids and purees. Otherwise, motorically, her pharyngeal phase is functional. Recommend starting Dys 1 (pureed) solids and nectar thick liquids via straw.    Swallow Evaluation Recommendations       SLP Diet Recommendations: Dysphagia 1 (Puree) solids;Nectar thick liquid   Liquid Administration via: Straw   Medication Administration: Crushed with puree   Supervision: Staff to assist with self feeding;Full supervision/cueing for compensatory strategies   Compensations: Minimize environmental distractions;Slow rate;Small sips/bites   Postural Changes: Seated upright at 90 degrees;Remain semi-upright after after feeds/meals (Comment)   Oral Care Recommendations: Oral care BID   Other Recommendations: Order thickener from pharmacy;Prohibited food (jello, ice cream, thin soups);Remove water  pitcher;Have oral suction available    Osie Bond., M.A. [facility] Pager [phone] Office [phone]  01/03/2020,12:37 PM

## 2020-01-03 NOTE — Progress Notes (Addendum)
STROKE TEAM PROGRESS NOTE   SUBJECTIVE (INTERVAL HISTORY) No family at bedside. RN and PT at bedside. Pt more awake alert, able to sit at edge of bed with PT today. Still has forced right gaze and left hemiplegia. LE venous doppler no DVT. Passed swallow, will put on diet and decreased TF to facilitate oral intake.   OBJECTIVE Temp:  [97.8 F (36.6 C)-99.8 F (37.7 C)] 97.8 F (36.6 C) (07/02 0800) Pulse Rate:  [58-74] 62 (07/02 0800) Cardiac Rhythm: Atrial fibrillation (07/02 0800) Resp:  [16-25] 20 (07/02 0800) BP: (117-153)/(50-82) 140/64 (07/02 0800) SpO2:  [96 %-100 %] 98 % (07/02 0800) Weight:  [92.4 kg] 92.4 kg (07/02 0500)  Recent Labs  Lab 01/02/20 1537 01/02/20 1948 01/02/20 2321 01/03/20 0332 01/03/20 0827  GLUCAP 121* 112* 129* 89 136*   Recent Labs  Lab 12/30/19 0500 12/30/19 0500 12/31/19 0342 12/31/19 0342 12/31/19 1211 12/31/19 1655 01/01/20 0615 01/02/20 0535 01/03/20 0442  NA 141  --  141  --   --   --  142 141 140  K 4.5  --  4.7  --   --   --  4.3 4.2 4.4  CL 113*  --  112*  --   --   --  112* 108 108  CO2 22  --  19*  --   --   --  22 25 23   GLUCOSE 149*  --  103*  --   --   --  125* 119* 126*  BUN 28*  --  33*  --   --   --  32* 31* 31*  CREATININE 1.44*  --  1.88*  --   --   --  1.64* 1.49* 1.32*  CALCIUM 7.9*   < > 8.0*   < >  --   --  8.1* 8.1* 8.4*  MG  --   --  1.9  --  1.8 1.8 1.8  --   --   PHOS  --   --   --   --  4.0 3.5 3.3  --   --    < > = values in this interval not displayed.   Recent Labs  Lab 12/29/19 1802  AST 17  ALT 11  ALKPHOS 82  BILITOT 0.6  PROT 6.2*  ALBUMIN 3.2*   Recent Labs  Lab 12/29/19 1802 12/29/19 1846 12/30/19 0500 12/30/19 0500 12/31/19 0342 01/01/20 0615 01/01/20 1446 01/02/20 0535 01/03/20 0442  WBC 5.8   < > 7.8  --  10.4 6.0  --  5.1 5.2  NEUTROABS 3.9  --  7.3  --   --   --   --   --   --   HGB 11.8*   < > 8.4*   < > 7.8* 7.3* 8.7* 8.6* 9.3*  HCT 38.3   < > 26.6*   < > 24.7* 23.5*  27.3* 27.2* 29.4*  MCV 104.4*   < > 103.1*  --  103.8* 104.9*  --  103.0* 103.2*  PLT 225   < > 200  --  223 157  --  175 212   < > = values in this interval not displayed.   No results for input(s): CKTOTAL, CKMB, CKMBINDEX, TROPONINI in the last 168 hours. No results for input(s): LABPROT, INR in the last 72 hours. No results for input(s): COLORURINE, LABSPEC, [place], GLUCOSEU, HGBUR, BILIRUBINUR, KETONESUR, PROTEINUR, UROBILINOGEN, NITRITE, LEUKOCYTESUR in the last 72 hours.  Invalid input(s): APPERANCEUR  Component Value Date/Time   CHOL 143 12/30/2019 0500   TRIG 110 01/01/2020 0615   HDL 41 12/30/2019 0500   CHOLHDL 3.5 12/30/2019 0500   VLDL 12 12/30/2019 0500   LDLCALC 90 12/30/2019 0500   Lab Results  Component Value Date   HGBA1C 5.2 12/30/2019   No results found for: LABOPIA, COCAINSCRNUR, LABBENZ, AMPHETMU, THCU, LABBARB  No results for input(s): ETH in the last 168 hours.  I have personally reviewed the radiological images below and agree with the radiology interpretations.  CT ABDOMEN PELVIS WO CONTRAST  Result Date: 12/29/2019 CLINICAL DATA:  Right inguinal hematoma, recent neuro interventional procedure, assess for retroperitoneal hematoma EXAM: CT ABDOMEN AND PELVIS WITHOUT CONTRAST TECHNIQUE: Multidetector CT imaging of the abdomen and pelvis was performed following the standard protocol without IV contrast. COMPARISON:  11/08/2019 FINDINGS: Lower chest: Hypoventilatory changes are seen within the dependent lower lobes. Heart is enlarged with trace pericardial fluid unchanged. Hepatobiliary: Gallbladder surgically absent. Unenhanced imaging the liver demonstrates no gross abnormalities. Pancreas: Unremarkable. No pancreatic ductal dilatation or surrounding inflammatory changes. Spleen: Normal in size without focal abnormality. Adrenals/Urinary Tract: Excreted contrast is seen within the bilateral kidneys related to previous neuro interventional procedure. Bilateral  renal cortical thinning is noted. Stable bilateral adrenal adenoma. Bilateral ureteral stents extend from the renal pelves into the bladder lumen. No filling defects within the bladder. Stomach/Bowel: Enteric catheter extends into the gastric lumen. No bowel obstruction or ileus. No bowel wall thickening or inflammatory change. Vascular/Lymphatic: Aortic atherosclerosis. No enlarged abdominal or pelvic lymph nodes. Reproductive: Uterus and bilateral adnexa are unremarkable. Other: Hematoma is seen within the right inguinal region extending along the inguinal crease into the pannus of the right lower quadrant abdominal wall. This measures up to 3.7 cm in thickness. There is no evidence of extension into the retroperitoneal space. At the time of the exam, extrinsic compression is being held at the right groin. There is no free fluid or free gas within the peritoneal cavity. Fat containing hernia again noted within the left lower quadrant abdominal wall. No bowel herniation. Musculoskeletal: No acute or destructive bony lesions. Reconstructed images demonstrate no additional findings. IMPRESSION: 1. Hematoma within the right inguinal region extending along the inguinal crease into the pannus of the right lower quadrant abdominal wall. No extension into the retroperitoneal space. 2. Bilateral ureteral stents as above. 3. Stable bilateral adrenal adenoma. 4. Aortic Atherosclerosis (ICD10-I70.0). Electronically Signed   By: Randa Ngo M.D.   On: 12/29/2019 23:48   CT Code Stroke CTA Head W/WO contrast  Result Date: 12/29/2019 CLINICAL DATA:  Left-sided weakness, code stroke follow-up EXAM: CT ANGIOGRAPHY HEAD AND NECK TECHNIQUE: Multidetector CT imaging of the head and neck was performed using the standard protocol during bolus administration of intravenous contrast. Multiplanar CT image reconstructions and MIPs were obtained to evaluate the vascular anatomy. Carotid stenosis measurements (when applicable) are  obtained utilizing NASCET criteria, using the distal internal carotid diameter as the denominator. CONTRAST:  16mL OMNIPAQUE IOHEXOL 350 MG/ML SOLN COMPARISON:  None. FINDINGS: CTA NECK Aortic arch: Minimal calcified plaque along the aortic arch. Mild calcified plaque at the great vessel origins, which are patent. Right carotid system: Patent. Common carotid has a retropharyngeal course. Mild calcified plaque at the ICA origin causing minimal stenosis. Left carotid system: Patent. Common carotid has a retropharyngeal course. Mild calcified plaque at the ICA origin causing minimal stenosis. Vertebral arteries: Patent. Left vertebral artery is dominant. Suspected stenosis of the proximal right vertebral artery. Skeleton:  Degenerative changes of the cervical spine. Other neck: No mass or adenopathy. Upper chest: Enlargement of the main pulmonary artery suggesting pulmonary arterial hypertension. Review of the MIP images confirms the above findings CTA HEAD Anterior circulation: Intracranial internal carotid arteries are patent with calcified plaque causing mild stenosis. Anterior cerebral arteries are patent. Left A1 ACA is dominant with diminutive or absent right A1 segment. There is occlusion of the mid right M1 MCA. There is preserved opacification of the more distal right MCA territory. Left MCA is patent. Posterior circulation: Intracranial vertebral arteries are patent. Basilar artery is patent. Posterior cerebral arteries are patent. There are posterior communicating arteries present bilaterally with fetal origin of the right PCA. Venous sinuses: Patent as allowed by contrast bolus timing. Review of the MIP images confirms the above findings IMPRESSION: Occlusion of the right M1 MCA. However, there is good filling of the more distal MCA territory. No hemodynamically significant stenosis in the neck. These results were communicated to Dr. Lorraine Lax at [place] 6/27/2021by text page via the [facility] messaging system.  Electronically Signed   By: Macy Mis M.D.   On: 12/29/2019 19:10   CT HEAD WO CONTRAST  Result Date: 12/29/2019 CLINICAL DATA:  Stroke follow-up.  Status post thrombectomy EXAM: CT HEAD WITHOUT CONTRAST TECHNIQUE: Contiguous axial images were obtained from the base of the skull through the vertex without intravenous contrast. COMPARISON:  Head CT 12/29/2019 FINDINGS: Brain: There is hyperdense material in the right subarachnoid space, overlying the frontal operculum and within the sylvian fissure. No other extra-axial collection. No midline shift or other mass effect. No intraparenchymal hemorrhage. 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 Vascular: Status post right MCA stent placement. Skull: Normal. Negative for fracture or focal lesion. Sinuses/Orbits: No acute finding. Other: None. IMPRESSION: 1. Hyperdense material in the right subarachnoid space, overlying the frontal operculum and within the Sylvian fissure. This is most likely contrast staining, but follow-up studies will be necessary to differentiate from subarachnoid hemorrhage. 2. Unchanged left parafalcine meningioma. Electronically Signed   By: Ulyses Jarred M.D.   On: 12/29/2019 23:49   CT Code Stroke CTA Neck W/WO contrast  Result Date: 12/29/2019 CLINICAL DATA:  Left-sided weakness, code stroke follow-up EXAM: CT ANGIOGRAPHY HEAD AND NECK TECHNIQUE: Multidetector CT imaging of the head and neck was performed using the standard protocol during bolus administration of intravenous contrast. Multiplanar CT image reconstructions and MIPs were obtained to evaluate the vascular anatomy. Carotid stenosis measurements (when applicable) are obtained utilizing NASCET criteria, using the distal internal carotid diameter as the denominator. CONTRAST:  37mL OMNIPAQUE IOHEXOL 350 MG/ML SOLN COMPARISON:  None. FINDINGS: CTA NECK Aortic arch: Minimal calcified plaque along the aortic arch. Mild calcified plaque at the great vessel origins, which are  patent. Right carotid system: Patent. Common carotid has a retropharyngeal course. Mild calcified plaque at the ICA origin causing minimal stenosis. Left carotid system: Patent. Common carotid has a retropharyngeal course. Mild calcified plaque at the ICA origin causing minimal stenosis. Vertebral arteries: Patent. Left vertebral artery is dominant. Suspected stenosis of the proximal right vertebral artery. Skeleton: Degenerative changes of the cervical spine. Other neck: No mass or adenopathy. Upper chest: Enlargement of the main pulmonary artery suggesting pulmonary arterial hypertension. Review of the MIP images confirms the above findings CTA HEAD Anterior circulation: Intracranial internal carotid arteries are patent with calcified plaque causing mild stenosis. Anterior cerebral arteries are patent. Left A1 ACA is dominant with diminutive or absent right A1 segment. There is occlusion of the  mid right M1 MCA. There is preserved opacification of the more distal right MCA territory. Left MCA is patent. Posterior circulation: Intracranial vertebral arteries are patent. Basilar artery is patent. Posterior cerebral arteries are patent. There are posterior communicating arteries present bilaterally with fetal origin of the right PCA. Venous sinuses: Patent as allowed by contrast bolus timing. Review of the MIP images confirms the above findings IMPRESSION: Occlusion of the right M1 MCA. However, there is good filling of the more distal MCA territory. No hemodynamically significant stenosis in the neck. These results were communicated to Dr. Lorraine Lax at [place] 6/27/2021by text page via the [facility] messaging system. Electronically Signed   By: Macy Mis M.D.   On: 12/29/2019 19:10   MR ANGIO HEAD WO CONTRAST  Result Date: 12/30/2019 CLINICAL DATA:  Status post right M1 occlusion with intervention EXAM: MRI HEAD WITHOUT CONTRAST MRA HEAD WITHOUT CONTRAST TECHNIQUE: Multiplanar, multiecho pulse sequences of the  brain and surrounding structures were obtained without intravenous contrast. Angiographic images of the head were obtained using MRA technique without contrast. COMPARISON:  Multiple CT studies done yesterday. FINDINGS: MRI HEAD FINDINGS Brain: Diffusion imaging shows acute infarction affecting the right occipital cortex. Acute infarction affects the right insular region and frontal operculum. Small foci of acute infarction elsewhere in the right frontal and parietal regions. Areas of infarction show mild swelling but there is no mass effect. Small focus of susceptibility in the right sylvian fissure region may be intravascular. Few small foci of susceptibility in the right parietal region also suspected to be intravascular. No sign of parenchymal hematoma. No hydrocephalus. No extra-axial collection. Mild chronic small-vessel ischemic changes elsewhere throughout the cerebral hemispheric white matter. 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 Vascular: Stent in the right M1 region. Skull and upper cervical spine: Negative Sinuses/Orbits: Clear/normal Other: None MRA HEAD FINDINGS Both internal carotid arteries show antegrade flow through the skull base. On the left, there is supply in the left MCA territory and both anterior cerebral artery territories. Moderate atherosclerotic irregularity of the MCA branches. On the right, there is signal loss related to the right MCA stent. More distal MCA branch vessels do show supply, with absence of the superior division. Right vertebral artery terminates in PICA. Left vertebral artery supplies the basilar. 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 Left PCA arises from the basilar tip. Distal vessel atherosclerotic irregularity. Right PCA arises from the right carotid and shows poor supply of the distal branches. 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 Fetal  origin right PCA with poor flow in the distal branch vessels. Acute infarction of the right insular region and frontal operculum. Missing superior division right MCA. Small scattered other acute infarctions in the right frontal and parietal regions. Swelling but no mass effect. No intraparenchymal hematoma. Few small foci of petechial blood versus vascular susceptibility signal. Right MCA stent. Missing superior division as noted above. Inferior division shows flow. Electronically Signed   By: Nelson Chimes M.D.   On: 12/30/2019 19:21   MR BRAIN WO CONTRAST  Result Date: 12/30/2019 CLINICAL DATA:  Status post right M1 occlusion with intervention EXAM: MRI HEAD WITHOUT CONTRAST MRA HEAD WITHOUT CONTRAST TECHNIQUE: Multiplanar, multiecho pulse sequences of the brain and surrounding structures were obtained without intravenous contrast. Angiographic images of the head were obtained using MRA technique without contrast. COMPARISON:  Multiple CT studies done yesterday. FINDINGS: MRI HEAD FINDINGS Brain: Diffusion imaging  shows acute infarction affecting the right occipital cortex. Acute infarction affects the right insular region and frontal operculum. Small foci of acute infarction elsewhere in the right frontal and parietal regions. Areas of infarction show mild swelling but there is no mass effect. Small focus of susceptibility in the right sylvian fissure region may be intravascular. Few small foci of susceptibility in the right parietal region also suspected to be intravascular. No sign of parenchymal hematoma. No hydrocephalus. No extra-axial collection. Mild chronic small-vessel ischemic changes elsewhere throughout the cerebral hemispheric white matter. 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 Vascular: Stent in the right M1 region. Skull and upper cervical spine: Negative Sinuses/Orbits: Clear/normal Other: None MRA HEAD FINDINGS  Both internal carotid arteries show antegrade flow through the skull base. On the left, there is supply in the left MCA territory and both anterior cerebral artery territories. Moderate atherosclerotic irregularity of the MCA branches. On the right, there is signal loss related to the right MCA stent. More distal MCA branch vessels do show supply, with absence of the superior division. Right vertebral artery terminates in PICA. Left vertebral artery supplies the basilar. 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 Left PCA arises from the basilar tip. Distal vessel atherosclerotic irregularity. Right PCA arises from the right carotid and shows poor supply of the distal branches. 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 Fetal origin right PCA with poor flow in the distal branch vessels. Acute infarction of the right insular region and frontal operculum. Missing superior division right MCA. Small scattered other acute infarctions in the right frontal and parietal regions. Swelling but no mass effect. No intraparenchymal hematoma. Few small foci of petechial blood versus vascular susceptibility signal. Right MCA stent. Missing superior division as noted above. Inferior division shows flow. Electronically Signed   By: Nelson Chimes M.D.   On: 12/30/2019 19:21   IR Intra Cran Stent  Result Date: 12/31/2019 INDICATION: New onset left-sided hemiplegia, right gaze deviation. Occluded right middle cerebral M1 segment on CT angiogram of the head and neck. EXAM: 1. EMERGENT LARGE VESSEL OCCLUSION THROMBOLYSIS (anterior CIRCULATION) COMPARISON:  CT angiogram of the head and neck of June, 27, 2021. MEDICATIONS: Vancomycin 1 g IV was administered within 1 hour of the procedure. ANESTHESIA/SEDATION: General anesthesia CONTRAST:  Isovue 300 approximately 170 mL FLUOROSCOPY TIME:  Fluoroscopy Time: 131 minutes 12 seconds (4835 mGy). COMPLICATIONS: None immediate. TECHNIQUE: Following a full  explanation of the procedure along with the potential associated complications, an informed witnessed consent was obtained the patient's son. The risks of intracranial hemorrhage of 10%, worsening neurological deficit, ventilator dependency, death and inability to revascularize were all reviewed in detail with the patient's son. The patient was then put under general anesthesia by the Department of Anesthesiology at [facility]. The right groin was prepped and draped in the usual sterile fashion. Thereafter using modified Seldinger technique, transfemoral access into the right common femoral artery was obtained without difficulty. Over a 0.035 inch guidewire an 8 French 25 cm Pinnacle sheath was inserted. Through this, and also over a 0.035 inch guidewire a combination of a select 5 French 125 cm Simmons 2 catheter inside of a 95 cm 087 balloon guide catheter was advanced without difficulty to the right common carotid artery. The guidewire and the select catheter were removed. Good aspiration was obtained from the hub of the balloon guide catheter just proximal to the right common carotid  artery bifurcation. Arteriogram was then performed centered extra cranially and intracranially. FINDINGS: The right common carotid arteriogram demonstrates the right external carotid artery and its major branches to be widely patent. The right internal carotid artery at the bulb demonstrates a smooth shallow plaque. Moderate tortuosity was noted of the proximal right internal carotid artery. 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 Distal to this the cervical petrous junction demonstrates normal opacification. Distal to this the petrous, cavernous, and the cavernous segments demonstrate patency as  does the supraclinoid segment. Opacification is seen of the right posterior communicating artery opacifying the right posterior cerebral distribution. The right middle cerebral artery demonstrates complete angiographic occlusion at the origin of the anterior temporal branch. Focal areas of caliber irregularity are seen of the proximal right middle cerebral artery. PROCEDURE: Through the 087 balloon guide catheter in the proximal right internal carotid artery, a Zoom 071 135 cm catheter inside of which was an 021 160 cm Phenom microcatheter was advanced over a 0.014 inch standard Synchro micro guidewire gingerly through the right internal carotid artery in the neck and into the supraclinoid right ICA. The micro guidewire was then gently manipulated with a torque device and advanced into the inferior division M2 M3 region of the right middle cerebral artery followed by the microcatheter. The guidewire was removed. Good aspiration was obtained from the hub of the microcatheter. Gentle contrast injection demonstrated safe position of tip of the microcatheter. A 5 mm x 37 mm Embotrap retrieval device was then advanced to the distal end of the microcatheter. The O ring on the delivery micro guidewire was loosened. With slight forward gentle traction with the right hand on the delivery micro guidewire, with the left hand the retrieval device was deployed. The 071 Zoom catheter was then advanced into the right middle cerebral artery engaging the occluded right middle cerebral artery. With constant flow arrest in the right internal carotid artery, and constant aspiration using a Penumbra device at the hub of the 071 Zoom catheter for approximately 3 minutes, the combination of the retrieval device, the microcatheter, and the Zoom aspiration catheter were retrieved and removed. Following reversal of flow arrest, a control arteriogram performed through the balloon guide catheter in the right internal carotid artery  demonstrates revascularization of the right middle cerebral artery and also of the superior and inferior divisions. The anterior temporal branch remains patent. A TICI 2B revascularization was achieved. This also unmasked an irregular filling defect at the right middle cerebral artery terminus at the trifurcation region with flow noted in the distal inferior division. A second pass was then made again with the combination of the 071 Zoom catheter advanced over an 021 160 cm Phenom microcatheter over a 0.014 inch standard Synchro micro guidewire. Again access was obtained into the distal M2 M3 region of the inferior division with the micro guidewire then followed by the microcatheter. The guidewire was removed. Again safe position of the tip of the microcatheter was confirmed and connected to continuous heparinized saline infusion. A 4 mm x 40 mm Solitaire X retrieval device was then deployed in the manner described above. Again with proximal flow arrest in the right internal carotid artery proximally, and constant aspiration via the Zoom catheter which was now imbedded in the occluded right middle cerebral artery at its trifurcation region over  approximately 2-1/2 minutes, the combination of the retrieval device, the microcatheter, and the Zoom catheter was retrieved and removed. Following reversal of flow arrest, a control arteriogram performed through the right internal carotid artery demonstrated improved opacification of the right MCA trifurcation branches. There continued be occluded superior division. The combination of the microcatheter, inside a 071 Zoom aspiration catheter was again advanced to the right middle cerebral artery over a 0.014 inch standard Synchro micro guidewire, which was now advanced into the superior division of the right middle cerebral artery emanating at the MCA trifurcation region. The microcatheter was advanced to the M2 region over a micro guidewire. The micro guidewire was removed.  Good aspiration obtained from the hub of the microcatheter. Again with flow arrest in the right internal carotid artery proximally, and constant aspiration with the Penumbra aspiration device at the hub of the aspiration catheter following deployment of a 3 mm x 20 mm Solitaire X retrieval device, for 2 minutes, the combination of the retrieval device, the microcatheter and the Zoom catheter were retrieved and removed. The superior division continued to be occluded. The superior division was again cannulated with a microcatheter over a micro guidewire as described above. After having verified safe position of the tip of the microcatheter, in the superior division M2 region, a 4 mm x 40 mm Solitaire X retrieval device was deployed in the usual manner. Again with proximal flow arrest in the proximal right ICA, and constant aspiration at the hub of the Zoom catheter at the site of the occluded superior division in the distal right middle cerebral M1 segment over 2 minutes, the combination of the retrieval device, the microcatheter and the retrieval device were retrieved and removed. Following reverse of flow arrest, there appeared to be modest improved flow in the proximal superior division. It was now noted the inferior division had a near occlusive clot in the proximal aspect. A fourth pass was then made this time using a combination of the Phenom microcatheter, inside of a 6 [place] Catalyst 135 cm catheter advanced over a 0.014 inch standard Synchro micro guidewire to the distal right M1 segment. The micro guidewire was then advanced into the M2 M3 region of the inferior division followed by the microcatheter. The guidewire was removed. A 4 mm x 40 mm Solitaire X retrieval device was deployed with the Catalyst guide catheter advanced at the origin of inferior division and distal to this. Following proximal flow arrest and constant aspiration for approximately 2 minutes at the hub of the Catalyst guide catheter, the  combination of the retrieval device, the microcatheter and the Catalyst catheter were retrieved and removed following reversal of flow arrest. Improved flow was now noted into the inferior division proximally through the two main branch points. Following each thrombectomy, strips of fragments were seen intertwined either in the retrieval device, or in the aspiration catheters. 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 It was therefore decided to proceed with placement of a rescue stent in order to prevent occlusion of the major right MCA inferior division branch. The microcatheter was again advanced over a 0.014 inch standard Synchro micro guidewire with a 6 [place] Catalyst guide catheter. The microcatheter was advanced over a micro guidewire without difficulty into the M2 region followed by the removal of the micro guidewire. Again good aspiration obtained from the hub of the microcatheter. This was then connected to continuous heparinized saline infusion. Measurements performed of the right middle  cerebral artery in the mid M1 segment, and also the proximal inferior division distal to the severe stenosis. A 4 mm x 24 mm Neuroform Atlas stent was advanced to the distal end of the microcatheter. The O ring on the delivery microcatheter was then loosened. With slight forward gentle traction with the right hand on the delivery micro guidewire with left hand the delivery microcatheter was gently retrieved unsheathing the distal and then the proximal portion of the stent with excellent coverage at the site of the severe stenosis. The delivery apparatus was removed. A control arteriogram performed through the balloon guide in the distal cervical ICA on the right demonstrated now significantly improved caliber and flow through the right middle cerebral artery the dominant inferior division. There was poor stagnant flow noted in the proximal  superior division. Free flow through the stented segment and also the other branch of the right middle cerebral artery except for the superior division. 8 mg of Integrilin was given intra-arterially in order to prevent intra stent platelet aggregation. A 10 minute post stent arteriogram continued to demonstrate flow through the stented segment in the right MCA distribution except for the non dominant superior division. A TICI 2b revascularization was maintained. The right posterior communicating artery with the right posterior cerebral artery distribution remained unchanged. Throughout the procedure, the patient's blood pressure and neurological status remained stable. A CT of the brain performed following the third pass demonstrated no mass-effect or midline shift with mild element of contrast in the right perisylvian region. The balloon guide was then retrieved and removed. The 8 French 25 cm Pinnacle sheath was then exchanged over a 0.035 inch J-tip guidewire for a short 8 [place] Pinnacle sheath. An arteriogram performed through this demonstrated spasm in the previously noted tortuous right common iliac artery. There was small amount of contrast noted in the soft tissue at the site of entry of the 8 [place] Pinnacle sheath which completely disappeared on repeat arteriogram performed approximately 3 minutes later. The 8 French sheath was removed and a 7 [place] ExoSeal closure device was placed. Also minimal compression was held for approximately 25 minutes. The distal pulses remained Dopplerable in the dorsalis pedis, and the posterior tibial regions bilaterally. Patient was left intubated on account of the patient's inability to communicate due to difficulty hearing and also her neurological condition. During this time, while pressure was held, and firmness was felt in the region of the pannus on the right side in the right lower quadrant, and also in the right inguinal region. In view of the unstable blood  pressure, the patient was sent to CT scan for CT of the pelvis and also of the abdomen and also CT of the brain. CT of the brain revealed no evidence of mass effect or midline shift with no significant change in the right perisylvian hyperattenuation felt to be due to a contrast stain. CT of the abdomen and pelvis revealed no evidence of the overlying skin tightness. Hemodynamically the patient was now stable with blood pressure in the 150s over 80s and the heart rate being regular in the 80s to 90s sinus rhythm. Patient was then transferred to the neuro ICU intubated for post thrombectomy management. IMPRESSION: Status post endovascular revascularization of occluded right middle cerebral M1 segment with 4 passes with different stent retrievers and Penumbra aspiration achieving a TICI 2B revascularization. Status post rescue stent placement from the distal M1 segment into the proximal dominant inferior division of the right middle cerebral artery due to  underlying severe intracranial arteriosclerosis. PLAN: Follow-up in the clinic 4 weeks post discharge. Electronically Signed   By: Luanne Bras M.D.   On: 12/30/2019 17:15   IR CT [facility]  Result Date: 12/31/2019 INDICATION: New onset left-sided hemiplegia, right gaze deviation. Occluded right middle cerebral M1 segment on CT angiogram of the head and neck. EXAM: 1. EMERGENT LARGE VESSEL OCCLUSION THROMBOLYSIS (anterior CIRCULATION) COMPARISON:  CT angiogram of the head and neck of June, 27, 2021. MEDICATIONS: Vancomycin 1 g IV was administered within 1 hour of the procedure. ANESTHESIA/SEDATION: General anesthesia CONTRAST:  Isovue 300 approximately 170 mL FLUOROSCOPY TIME:  Fluoroscopy Time: 131 minutes 12 seconds (4835 mGy). COMPLICATIONS: None immediate. TECHNIQUE: Following a full explanation of the procedure along with the potential associated complications, an informed witnessed consent was obtained the patient's son. The risks of intracranial  hemorrhage of 10%, worsening neurological deficit, ventilator dependency, death and inability to revascularize were all reviewed in detail with the patient's son. The patient was then put under general anesthesia by the Department of Anesthesiology at [facility]. The right groin was prepped and draped in the usual sterile fashion. Thereafter using modified Seldinger technique, transfemoral access into the right common femoral artery was obtained without difficulty. Over a 0.035 inch guidewire an 8 French 25 cm Pinnacle sheath was inserted. Through this, and also over a 0.035 inch guidewire a combination of a select 5 French 125 cm Simmons 2 catheter inside of a 95 cm 087 balloon guide catheter was advanced without difficulty to the right common carotid artery. The guidewire and the select catheter were removed. Good aspiration was obtained from the hub of the balloon guide catheter just proximal to the right common carotid artery bifurcation. Arteriogram was then performed centered extra cranially and intracranially. FINDINGS: The right common carotid arteriogram demonstrates the right external carotid artery and its major branches to be widely patent. The right internal carotid artery at the bulb demonstrates a smooth shallow plaque. Moderate tortuosity was noted of the proximal right internal carotid artery. 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 Distal to this the cervical petrous junction demonstrates normal opacification. Distal to this the petrous, cavernous, and the cavernous segments demonstrate patency as does the supraclinoid segment. Opacification is seen of the right posterior communicating artery opacifying the right posterior cerebral distribution. The right middle  cerebral artery demonstrates complete angiographic occlusion at the origin of the anterior temporal branch. Focal areas of caliber irregularity are seen of the proximal right middle cerebral artery. PROCEDURE: Through the 087 balloon guide catheter in the proximal right internal carotid artery, a Zoom 071 135 cm catheter inside of which was an 021 160 cm Phenom microcatheter was advanced over a 0.014 inch standard Synchro micro guidewire gingerly through the right internal carotid artery in the neck and into the supraclinoid right ICA. The micro guidewire was then gently manipulated with a torque device and advanced into the inferior division M2 M3 region of the right middle cerebral artery followed by the microcatheter. The guidewire was removed. Good aspiration was obtained from the hub of the microcatheter. Gentle contrast injection demonstrated safe position of tip of the microcatheter. A 5 mm x 37 mm Embotrap retrieval device was then advanced to the distal end of the microcatheter. The O ring on the delivery micro guidewire  was loosened. With slight forward gentle traction with the right hand on the delivery micro guidewire, with the left hand the retrieval device was deployed. The 071 Zoom catheter was then advanced into the right middle cerebral artery engaging the occluded right middle cerebral artery. With constant flow arrest in the right internal carotid artery, and constant aspiration using a Penumbra device at the hub of the 071 Zoom catheter for approximately 3 minutes, the combination of the retrieval device, the microcatheter, and the Zoom aspiration catheter were retrieved and removed. Following reversal of flow arrest, a control arteriogram performed through the balloon guide catheter in the right internal carotid artery demonstrates revascularization of the right middle cerebral artery and also of the superior and inferior divisions. The anterior temporal branch remains patent. A TICI 2B  revascularization was achieved. This also unmasked an irregular filling defect at the right middle cerebral artery terminus at the trifurcation region with flow noted in the distal inferior division. A second pass was then made again with the combination of the 071 Zoom catheter advanced over an 021 160 cm Phenom microcatheter over a 0.014 inch standard Synchro micro guidewire. Again access was obtained into the distal M2 M3 region of the inferior division with the micro guidewire then followed by the microcatheter. The guidewire was removed. Again safe position of the tip of the microcatheter was confirmed and connected to continuous heparinized saline infusion. A 4 mm x 40 mm Solitaire X retrieval device was then deployed in the manner described above. Again with proximal flow arrest in the right internal carotid artery proximally, and constant aspiration via the Zoom catheter which was now imbedded in the occluded right middle cerebral artery at its trifurcation region over approximately 2-1/2 minutes, the combination of the retrieval device, the microcatheter, and the Zoom catheter was retrieved and removed. Following reversal of flow arrest, a control arteriogram performed through the right internal carotid artery demonstrated improved opacification of the right MCA trifurcation branches. There continued be occluded superior division. The combination of the microcatheter, inside a 071 Zoom aspiration catheter was again advanced to the right middle cerebral artery over a 0.014 inch standard Synchro micro guidewire, which was now advanced into the superior division of the right middle cerebral artery emanating at the MCA trifurcation region. The microcatheter was advanced to the M2 region over a micro guidewire. The micro guidewire was removed. Good aspiration obtained from the hub of the microcatheter. Again with flow arrest in the right internal carotid artery proximally, and constant aspiration with the  Penumbra aspiration device at the hub of the aspiration catheter following deployment of a 3 mm x 20 mm Solitaire X retrieval device, for 2 minutes, the combination of the retrieval device, the microcatheter and the Zoom catheter were retrieved and removed. The superior division continued to be occluded. The superior division was again cannulated with a microcatheter over a micro guidewire as described above. After having verified safe position of the tip of the microcatheter, in the superior division M2 region, a 4 mm x 40 mm Solitaire X retrieval device was deployed in the usual manner. Again with proximal flow arrest in the proximal right ICA, and constant aspiration at the hub of the Zoom catheter at the site of the occluded superior division in the distal right middle cerebral M1 segment over 2 minutes, the combination of the retrieval device, the microcatheter and the retrieval device were retrieved and removed. Following reverse of flow arrest, there appeared to be modest improved flow  in the proximal superior division. It was now noted the inferior division had a near occlusive clot in the proximal aspect. A fourth pass was then made this time using a combination of the Phenom microcatheter, inside of a 6 [place] Catalyst 135 cm catheter advanced over a 0.014 inch standard Synchro micro guidewire to the distal right M1 segment. The micro guidewire was then advanced into the M2 M3 region of the inferior division followed by the microcatheter. The guidewire was removed. A 4 mm x 40 mm Solitaire X retrieval device was deployed with the Catalyst guide catheter advanced at the origin of inferior division and distal to this. Following proximal flow arrest and constant aspiration for approximately 2 minutes at the hub of the Catalyst guide catheter, the combination of the retrieval device, the microcatheter and the Catalyst catheter were retrieved and removed following reversal of flow arrest. Improved flow was now  noted into the inferior division proximally through the two main branch points. Following each thrombectomy, strips of fragments were seen intertwined either in the retrieval device, or in the aspiration catheters. 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 It was therefore decided to proceed with placement of a rescue stent in order to prevent occlusion of the major right MCA inferior division branch. The microcatheter was again advanced over a 0.014 inch standard Synchro micro guidewire with a 6 [place] Catalyst guide catheter. The microcatheter was advanced over a micro guidewire without difficulty into the M2 region followed by the removal of the micro guidewire. Again good aspiration obtained from the hub of the microcatheter. This was then connected to continuous heparinized saline infusion. Measurements performed of the right middle cerebral artery in the mid M1 segment, and also the proximal inferior division distal to the severe stenosis. A 4 mm x 24 mm Neuroform Atlas stent was advanced to the distal end of the microcatheter. The O ring on the delivery microcatheter was then loosened. With slight forward gentle traction with the right hand on the delivery micro guidewire with left hand the delivery microcatheter was gently retrieved unsheathing the distal and then the proximal portion of the stent with excellent coverage at the site of the severe stenosis. The delivery apparatus was removed. A control arteriogram performed through the balloon guide in the distal cervical ICA on the right demonstrated now significantly improved caliber and flow through the right middle cerebral artery the dominant inferior division. There was poor stagnant flow noted in the proximal superior division. Free flow through the stented segment and also the other branch of the right middle cerebral artery except for the superior division. 8 mg of  Integrilin was given intra-arterially in order to prevent intra stent platelet aggregation. A 10 minute post stent arteriogram continued to demonstrate flow through the stented segment in the right MCA distribution except for the non dominant superior division. A TICI 2b revascularization was maintained. The right posterior communicating artery with the right posterior cerebral artery distribution remained unchanged. Throughout the procedure, the patient's blood pressure and neurological status remained stable. A CT of the brain performed following the third pass demonstrated no mass-effect or midline shift with mild element of contrast in the right perisylvian region. The balloon guide was then retrieved and removed. The 8 French 25 cm Pinnacle sheath was then exchanged over a 0.035 inch J-tip guidewire for a short 8 [place] Pinnacle sheath. An arteriogram performed through this demonstrated spasm in the previously noted tortuous right  common iliac artery. There was small amount of contrast noted in the soft tissue at the site of entry of the 8 [place] Pinnacle sheath which completely disappeared on repeat arteriogram performed approximately 3 minutes later. The 8 French sheath was removed and a 7 [place] ExoSeal closure device was placed. Also minimal compression was held for approximately 25 minutes. The distal pulses remained Dopplerable in the dorsalis pedis, and the posterior tibial regions bilaterally. Patient was left intubated on account of the patient's inability to communicate due to difficulty hearing and also her neurological condition. During this time, while pressure was held, and firmness was felt in the region of the pannus on the right side in the right lower quadrant, and also in the right inguinal region. In view of the unstable blood pressure, the patient was sent to CT scan for CT of the pelvis and also of the abdomen and also CT of the brain. CT of the brain revealed no evidence of mass effect or  midline shift with no significant change in the right perisylvian hyperattenuation felt to be due to a contrast stain. CT of the abdomen and pelvis revealed no evidence of the overlying skin tightness. Hemodynamically the patient was now stable with blood pressure in the 150s over 80s and the heart rate being regular in the 80s to 90s sinus rhythm. Patient was then transferred to the neuro ICU intubated for post thrombectomy management. IMPRESSION: Status post endovascular revascularization of occluded right middle cerebral M1 segment with 4 passes with different stent retrievers and Penumbra aspiration achieving a TICI 2B revascularization. Status post rescue stent placement from the distal M1 segment into the proximal dominant inferior division of the right middle cerebral artery due to underlying severe intracranial arteriosclerosis. PLAN: Follow-up in the clinic 4 weeks post discharge. Electronically Signed   By: Luanne Bras M.D.   On: 12/30/2019 17:15   DG CHEST PORT 1 VIEW  Result Date: 12/31/2019 CLINICAL DATA:  Status post extubation EXAM: PORTABLE CHEST 1 VIEW COMPARISON:  12/30/2019 FINDINGS: Cardiac shadow remains enlarged. Aortic calcifications are again seen. Feeding catheter is noted extending into the stomach. Endotracheal tube has been removed in the interval. The lungs are well aerated without focal infiltrate or sizable effusion. IMPRESSION: Status post extubation. No acute abnormality noted. Electronically Signed   By: Inez Catalina M.D.   On: 12/31/2019 11:27   DG Chest Port 1 View  Result Date: 12/30/2019 CLINICAL DATA:  Intubation. EXAM: PORTABLE CHEST 1 VIEW COMPARISON:  Radiograph 11/08/2019. FINDINGS: Low positioning of the endotracheal tube 6 mm from the carina. Enteric tube in place with tip below the diaphragm not included in this chest field of view. Stable cardiomegaly. Unchanged mediastinal contours. Interstitial coarsening which appears chronic. No focal airspace  disease, pleural effusion, or pneumothorax. No acute osseous abnormalities are seen. IMPRESSION: 1. Low positioning of the endotracheal tube 6 mm from the carina. Retraction of 2-3 cm recommended. 2. Enteric tube in place with tip below the diaphragm. 3. Stable cardiomegaly and chronic interstitial coarsening. Electronically Signed   By: Keith Rake M.D.   On: 12/30/2019 01:11   DG Knee Left Port  Result Date: 01/02/2020 CLINICAL DATA:  Left knee pain. EXAM: PORTABLE LEFT KNEE - 1-2 VIEW COMPARISON:  None. FINDINGS: No evidence of fracture, dislocation, or joint effusion. Severe degenerative changes seen involving the medial joint space. Moderate degenerative changes seen involving the lateral joint space. Severe degenerative changes seen involving the patellofemoral space. Soft tissues are unremarkable. IMPRESSION: Severe  tricompartmental osteoarthritis. No acute abnormality seen in the left knee. Electronically Signed   By: Marijo Conception M.D.   On: 01/02/2020 14:59   DG Abd Portable 1V  Result Date: 12/30/2019 CLINICAL DATA:  OG tube placement. EXAM: PORTABLE ABDOMEN - 1 VIEW COMPARISON:  Abdomen pelvis CT yesterday. FINDINGS: Tip and side port of the enteric tube below the diaphragm in the stomach. Excreted IV contrast within both renal collecting systems and urinary bladder. Bilateral ureteral stents in place. Nephrograms demonstrate mild hydronephrosis. Nonobstructive bowel gas pattern. IMPRESSION: 1. Tip and side port of the enteric tube below the diaphragm in the stomach. 2. Bilateral ureteral stents in place. Excreted IV contrast in both renal collecting systems in the urinary bladder with mild bilateral hydronephrosis. Electronically Signed   By: Keith Rake M.D.   On: 12/30/2019 01:13   DG C-Arm 1-60 Min-No Report  Result Date: 12/25/2019 Fluoroscopy was utilized by the requesting physician.  No radiographic interpretation.   DG C-Arm 1-60 Min-No Report  Result Date:  12/06/2019 Fluoroscopy was utilized by the requesting physician.  No radiographic interpretation.   VAS [place] GROIN PSEUDOANEURYSM  Result Date: 01/01/2020  ARTERIAL PSEUDOANEURYSM  Exam: Right groin Indications: Patient complains of bruising and drop in hemoglobin. History: S/p catheterization. Comparison Study: 12/30/19 negative pseudoaneurysm/ hematoma Performing Technologist: June Leap RDMS, RVT  Examination Guidelines: A complete evaluation includes B-mode imaging, spectral Doppler, color Doppler, and power Doppler as needed of all accessible portions of each vessel. Bilateral testing is considered an integral part of a complete examination. Limited examinations for reoccurring indications may be performed as noted. +------------+----------+---------+------+----------+ Right DuplexPSV (cm/s)Waveform PlaqueComment(s) +------------+----------+---------+------+----------+ CFA            140    triphasic                 +------------+----------+---------+------+----------+ Right Vein comments:patent CFV  Findings: No hematoma visualized  Summary: No evidence of pseudoaneurysm, AVF or DVT  Diagnosing physician: Harold Barban MD Electronically signed by Harold Barban MD on 01/01/2020 at 8:52:29 PM.   --------------------------------------------------------------------------------    Final    VAS [place] GROIN PSEUDOANEURYSM  Result Date: 12/31/2019  ARTERIAL PSEUDOANEURYSM  Exam: Right groin Indications: Patient complains of bruising. Comparison Study: no prior Performing Technologist: Abram Sander RVS  Examination Guidelines: A complete evaluation includes B-mode imaging, spectral Doppler, color Doppler, and power Doppler as needed of all accessible portions of each vessel. Bilateral testing is considered an integral part of a complete examination. Limited examinations for reoccurring indications may be performed as noted. +------------+----------+--------+------+----------+ Right DuplexPSV  (cm/s)WaveformPlaqueComment(s) +------------+----------+--------+------+----------+ CFA            147    biphasic                 +------------+----------+--------+------+----------+ Prox SFA       146    biphasic                 +------------+----------+--------+------+----------+  Summary: No evidence of pseudoaneurysm, AVF or DVT  Diagnosing physician: Deitra Mayo MD Electronically signed by Deitra Mayo MD on 12/31/2019 at 8:54:10 AM.   --------------------------------------------------------------------------------    Final    IR PERCUTANEOUS ART THROMBECTOMY/INFUSION INTRACRANIAL INC DIAG ANGIO  Result Date: 12/31/2019 INDICATION: New onset left-sided hemiplegia, right gaze deviation. Occluded right middle cerebral M1 segment on CT angiogram of the head and neck. EXAM: 1. EMERGENT LARGE VESSEL OCCLUSION THROMBOLYSIS (anterior CIRCULATION) COMPARISON:  CT angiogram of the head and neck of June, 27, 2021. MEDICATIONS: Vancomycin  1 g IV was administered within 1 hour of the procedure. ANESTHESIA/SEDATION: General anesthesia CONTRAST:  Isovue 300 approximately 170 mL FLUOROSCOPY TIME:  Fluoroscopy Time: 131 minutes 12 seconds (4835 mGy). COMPLICATIONS: None immediate. TECHNIQUE: Following a full explanation of the procedure along with the potential associated complications, an informed witnessed consent was obtained the patient's son. The risks of intracranial hemorrhage of 10%, worsening neurological deficit, ventilator dependency, death and inability to revascularize were all reviewed in detail with the patient's son. The patient was then put under general anesthesia by the Department of Anesthesiology at [facility]. The right groin was prepped and draped in the usual sterile fashion. Thereafter using modified Seldinger technique, transfemoral access into the right common femoral artery was obtained without difficulty. Over a 0.035 inch guidewire an 8 French 25 cm  Pinnacle sheath was inserted. Through this, and also over a 0.035 inch guidewire a combination of a select 5 French 125 cm Simmons 2 catheter inside of a 95 cm 087 balloon guide catheter was advanced without difficulty to the right common carotid artery. The guidewire and the select catheter were removed. Good aspiration was obtained from the hub of the balloon guide catheter just proximal to the right common carotid artery bifurcation. Arteriogram was then performed centered extra cranially and intracranially. FINDINGS: The right common carotid arteriogram demonstrates the right external carotid artery and its major branches to be widely patent. The right internal carotid artery at the bulb demonstrates a smooth shallow plaque. Moderate tortuosity was noted of the proximal right internal carotid artery. 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 Distal to this the cervical petrous junction demonstrates normal opacification. Distal to this the petrous, cavernous, and the cavernous segments demonstrate patency as does the supraclinoid segment. Opacification is seen of the right posterior communicating artery opacifying the right posterior cerebral distribution. The right middle cerebral artery demonstrates complete angiographic occlusion at the origin of the anterior temporal branch. Focal areas of caliber irregularity are seen of the proximal right middle cerebral artery. PROCEDURE: Through the 087 balloon guide catheter in the proximal right internal carotid artery, a Zoom 071 135 cm catheter inside of which was an 021 160 cm Phenom microcatheter was advanced over a 0.014 inch standard Synchro micro guidewire gingerly through the right internal carotid artery in the neck and into the  supraclinoid right ICA. The micro guidewire was then gently manipulated with a torque device and advanced into the inferior division M2 M3 region of the right middle cerebral artery followed by the microcatheter. The guidewire was removed. Good aspiration was obtained from the hub of the microcatheter. Gentle contrast injection demonstrated safe position of tip of the microcatheter. A 5 mm x 37 mm Embotrap retrieval device was then advanced to the distal end of the microcatheter. The O ring on the delivery micro guidewire was loosened. With slight forward gentle traction with the right hand on the delivery micro guidewire, with the left hand the retrieval device was deployed. The 071 Zoom catheter was then advanced into the right middle cerebral artery engaging the occluded right middle cerebral artery. With constant flow arrest in the right internal carotid artery, and constant aspiration using a Penumbra device at the hub of the 071 Zoom catheter for approximately 3 minutes, the combination of the retrieval device, the microcatheter, and  the Zoom aspiration catheter were retrieved and removed. Following reversal of flow arrest, a control arteriogram performed through the balloon guide catheter in the right internal carotid artery demonstrates revascularization of the right middle cerebral artery and also of the superior and inferior divisions. The anterior temporal branch remains patent. A TICI 2B revascularization was achieved. This also unmasked an irregular filling defect at the right middle cerebral artery terminus at the trifurcation region with flow noted in the distal inferior division. A second pass was then made again with the combination of the 071 Zoom catheter advanced over an 021 160 cm Phenom microcatheter over a 0.014 inch standard Synchro micro guidewire. Again access was obtained into the distal M2 M3 region of the inferior division with the micro guidewire then followed by the microcatheter. The  guidewire was removed. Again safe position of the tip of the microcatheter was confirmed and connected to continuous heparinized saline infusion. A 4 mm x 40 mm Solitaire X retrieval device was then deployed in the manner described above. Again with proximal flow arrest in the right internal carotid artery proximally, and constant aspiration via the Zoom catheter which was now imbedded in the occluded right middle cerebral artery at its trifurcation region over approximately 2-1/2 minutes, the combination of the retrieval device, the microcatheter, and the Zoom catheter was retrieved and removed. Following reversal of flow arrest, a control arteriogram performed through the right internal carotid artery demonstrated improved opacification of the right MCA trifurcation branches. There continued be occluded superior division. The combination of the microcatheter, inside a 071 Zoom aspiration catheter was again advanced to the right middle cerebral artery over a 0.014 inch standard Synchro micro guidewire, which was now advanced into the superior division of the right middle cerebral artery emanating at the MCA trifurcation region. The microcatheter was advanced to the M2 region over a micro guidewire. The micro guidewire was removed. Good aspiration obtained from the hub of the microcatheter. Again with flow arrest in the right internal carotid artery proximally, and constant aspiration with the Penumbra aspiration device at the hub of the aspiration catheter following deployment of a 3 mm x 20 mm Solitaire X retrieval device, for 2 minutes, the combination of the retrieval device, the microcatheter and the Zoom catheter were retrieved and removed. The superior division continued to be occluded. The superior division was again cannulated with a microcatheter over a micro guidewire as described above. After having verified safe position of the tip of the microcatheter, in the superior division M2 region, a 4 mm x 40 mm  Solitaire X retrieval device was deployed in the usual manner. Again with proximal flow arrest in the proximal right ICA, and constant aspiration at the hub of the Zoom catheter at the site of the occluded superior division in the distal right middle cerebral M1 segment over 2 minutes, the combination of the retrieval device, the microcatheter and the retrieval device were retrieved and removed. Following reverse of flow arrest, there appeared to be modest improved flow in the proximal superior division. It was now noted the inferior division had a near occlusive clot in the proximal aspect. A fourth pass was then made this time using a combination of the Phenom microcatheter, inside of a 6 [place] Catalyst 135 cm catheter advanced over a 0.014 inch standard Synchro micro guidewire to the distal right M1 segment. The micro guidewire was then advanced into the M2 M3 region of the inferior division followed by the microcatheter. The guidewire was removed.  A 4 mm x 40 mm Solitaire X retrieval device was deployed with the Catalyst guide catheter advanced at the origin of inferior division and distal to this. Following proximal flow arrest and constant aspiration for approximately 2 minutes at the hub of the Catalyst guide catheter, the combination of the retrieval device, the microcatheter and the Catalyst catheter were retrieved and removed following reversal of flow arrest. Improved flow was now noted into the inferior division proximally through the two main branch points. Following each thrombectomy, strips of fragments were seen intertwined either in the retrieval device, or in the aspiration catheters. 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 It was therefore decided to proceed with placement of a rescue stent in order to prevent occlusion of the major right MCA inferior division branch. The microcatheter was again advanced over a 0.014  inch standard Synchro micro guidewire with a 6 [place] Catalyst guide catheter. The microcatheter was advanced over a micro guidewire without difficulty into the M2 region followed by the removal of the micro guidewire. Again good aspiration obtained from the hub of the microcatheter. This was then connected to continuous heparinized saline infusion. Measurements performed of the right middle cerebral artery in the mid M1 segment, and also the proximal inferior division distal to the severe stenosis. A 4 mm x 24 mm Neuroform Atlas stent was advanced to the distal end of the microcatheter. The O ring on the delivery microcatheter was then loosened. With slight forward gentle traction with the right hand on the delivery micro guidewire with left hand the delivery microcatheter was gently retrieved unsheathing the distal and then the proximal portion of the stent with excellent coverage at the site of the severe stenosis. The delivery apparatus was removed. A control arteriogram performed through the balloon guide in the distal cervical ICA on the right demonstrated now significantly improved caliber and flow through the right middle cerebral artery the dominant inferior division. There was poor stagnant flow noted in the proximal superior division. Free flow through the stented segment and also the other branch of the right middle cerebral artery except for the superior division. 8 mg of Integrilin was given intra-arterially in order to prevent intra stent platelet aggregation. A 10 minute post stent arteriogram continued to demonstrate flow through the stented segment in the right MCA distribution except for the non dominant superior division. A TICI 2b revascularization was maintained. The right posterior communicating artery with the right posterior cerebral artery distribution remained unchanged. Throughout the procedure, the patient's blood pressure and neurological status remained stable. A CT of the brain  performed following the third pass demonstrated no mass-effect or midline shift with mild element of contrast in the right perisylvian region. The balloon guide was then retrieved and removed. The 8 French 25 cm Pinnacle sheath was then exchanged over a 0.035 inch J-tip guidewire for a short 8 [place] Pinnacle sheath. An arteriogram performed through this demonstrated spasm in the previously noted tortuous right common iliac artery. There was small amount of contrast noted in the soft tissue at the site of entry of the 8 [place] Pinnacle sheath which completely disappeared on repeat arteriogram performed approximately 3 minutes later. The 8 French sheath was removed and a 7 [place] ExoSeal closure device was placed. Also minimal compression was held for approximately 25 minutes. The distal pulses remained Dopplerable in the dorsalis pedis, and the posterior tibial regions bilaterally. Patient was left intubated on account of the  patient's inability to communicate due to difficulty hearing and also her neurological condition. During this time, while pressure was held, and firmness was felt in the region of the pannus on the right side in the right lower quadrant, and also in the right inguinal region. In view of the unstable blood pressure, the patient was sent to CT scan for CT of the pelvis and also of the abdomen and also CT of the brain. CT of the brain revealed no evidence of mass effect or midline shift with no significant change in the right perisylvian hyperattenuation felt to be due to a contrast stain. CT of the abdomen and pelvis revealed no evidence of the overlying skin tightness. Hemodynamically the patient was now stable with blood pressure in the 150s over 80s and the heart rate being regular in the 80s to 90s sinus rhythm. Patient was then transferred to the neuro ICU intubated for post thrombectomy management. IMPRESSION: Status post endovascular revascularization of occluded right middle cerebral M1  segment with 4 passes with different stent retrievers and Penumbra aspiration achieving a TICI 2B revascularization. Status post rescue stent placement from the distal M1 segment into the proximal dominant inferior division of the right middle cerebral artery due to underlying severe intracranial arteriosclerosis. PLAN: Follow-up in the clinic 4 weeks post discharge. Electronically Signed   By: Luanne Bras M.D.   On: 12/30/2019 17:15   DG HIP PORT UNILAT WITH PELVIS 1V LEFT  Result Date: 01/02/2020 CLINICAL DATA:  Left hip pain. EXAM: DG HIP (WITH OR WITHOUT PELVIS) 1V PORT LEFT COMPARISON:  None. FINDINGS: There is no evidence of hip fracture or dislocation. There is no evidence of arthropathy or other focal bone abnormality. IMPRESSION: Negative. Electronically Signed   By: Marijo Conception M.D.   On: 01/02/2020 15:00   CT HEAD CODE STROKE WO CONTRAST  Result Date: 12/29/2019 CLINICAL DATA:  Code stroke.  Left-sided weakness EXAM: CT HEAD WITHOUT CONTRAST TECHNIQUE: Contiguous axial images were obtained from the base of the skull through the vertex without intravenous contrast. COMPARISON:  None. FINDINGS: Brain: There is no acute intracranial hemorrhage or mass effect. Gray-white differentiation is preserved. Ventricles and sulci are within normal limits in size and configuration. Patchy hypoattenuation in the supratentorial white matter is nonspecific but probably reflects chronic microvascular ischemic changes. Extra-axial dural-based hyperdense lesion along the high left frontal convexity and falx measuring 2.6 x 1.8 x 2.3 cm is consistent with a meningioma. There is no apparent underlying edema. This abuts the superior sagittal sinus. Vascular: No hyperdense vessel. Mild intracranial atherosclerotic calcification at the skull base. Skull: Unremarkable. Sinuses/Orbits: No acute abnormality. Other: Mastoid air cells are clear. ASPECTS ([place] Stroke Program Early CT Score) - Ganglionic level  infarction (caudate, lentiform nuclei, internal capsule, insula, M1-M3 cortex): 7 - Supraganglionic infarction (M4-M6 cortex): 3 Total score (0-10 with 10 being normal): 10 IMPRESSION: No acute intracranial hemorrhage or evidence of acute infarction. ASPECT score is 10. Chronic microvascular ischemic changes. Left frontal meningioma without significant mass effect or underlying edema. Initial results were communicated to Dr. Lorraine Lax at [place] 6/27/2021by text page via the [facility] messaging system. Electronically Signed   By: Macy Mis M.D.   On: 12/29/2019 18:20   VAS [place] LOWER EXTREMITY VENOUS (DVT)  Result Date: 01/02/2020  Lower Venous DVTStudy Indications: Swelling.  Risk Factors: None identified. Limitations: Body habitus, poor ultrasound/tissue interface and patient positioning, patient pain tolerance. Comparison Study: No prior studies. Performing Technologist: Oliver Hum RVT  Examination  Guidelines: A complete evaluation includes B-mode imaging, spectral Doppler, color Doppler, and power Doppler as needed of all accessible portions of each vessel. Bilateral testing is considered an integral part of a complete examination. Limited examinations for reoccurring indications may be performed as noted. The reflux portion of the exam is performed with the patient in reverse Trendelenburg.  +-----+---------------+---------+-----------+----------+--------------+ RIGHTCompressibilityPhasicitySpontaneityPropertiesThrombus Aging +-----+---------------+---------+-----------+----------+--------------+ CFV  Full           Yes      Yes                                 +-----+---------------+---------+-----------+----------+--------------+   +---------+---------------+---------+-----------+----------+--------------+ LEFT     CompressibilityPhasicitySpontaneityPropertiesThrombus Aging +---------+---------------+---------+-----------+----------+--------------+ CFV      Full           Yes       Yes                                 +---------+---------------+---------+-----------+----------+--------------+ SFJ      Full                                                        +---------+---------------+---------+-----------+----------+--------------+ FV Prox  Full                                                        +---------+---------------+---------+-----------+----------+--------------+ FV Mid   Full                                                        +---------+---------------+---------+-----------+----------+--------------+ FV Distal               Yes      Yes                                 +---------+---------------+---------+-----------+----------+--------------+ PFV      Full                                                        +---------+---------------+---------+-----------+----------+--------------+ POP      Full           Yes      Yes                                 +---------+---------------+---------+-----------+----------+--------------+ PTV                                                   Not visualized +---------+---------------+---------+-----------+----------+--------------+ PERO  Not visualized +---------+---------------+---------+-----------+----------+--------------+     Summary: RIGHT: - No evidence of common femoral vein obstruction.  LEFT: - There is no evidence of deep vein thrombosis in the lower extremity. However, portions of this examination were limited- see technologist comments above.  - No cystic structure found in the popliteal fossa.  *See table(s) above for measurements and observations. Electronically signed by Servando Snare MD on 01/02/2020 at 4:31:17 PM.    Final    ECHOCARDIOGRAM LIMITED  Result Date: 12/30/2019    ECHOCARDIOGRAM LIMITED REPORT   Patient Name:   Gloria [name] Date of Exam: 12/30/2019 Medical Rec #:  [id number]           Height:        62.0 in Accession #:    [id number]          Weight:       211.0 lb Date of Birth:  [birth year]            BSA:          1.956 m Patient Age:    74 years            BP:           132/80 mmHg Patient Gender: F                   HR:           64 bpm. Exam Location:  Inpatient Procedure: Limited Color Doppler, Cardiac Doppler and Limited Echo Indications:    stroke 434.91  History:        Patient has prior history of Echocardiogram examinations, most                 recent 11/09/2019. Arrythmias:Atrial Fibrillation.  Sonographer:    Johny Chess Referring Phys: [id number] [place]  1. Left ventricular ejection fraction, by estimation, is >75%. The left ventricle has hyperdynamic function. The left ventricle has no regional wall motion abnormalities. There is severe left ventricular hypertrophy. Left ventricular diastolic parameters are indeterminate.  2. Right ventricular systolic function is normal. The right ventricular size is normal. There is mildly elevated pulmonary artery systolic pressure.  3. Tricuspid valve regurgitation is mild to moderate.  4. The aortic valve is abnormal. Aortic valve regurgitation is mild to moderate. Mild aortic valve sclerosis is present, with no evidence of aortic valve stenosis.  5. The inferior vena cava is normal in size with greater than 50% respiratory variability, suggesting right atrial pressure of 3 mmHg. FINDINGS  Left Ventricle: Left ventricular ejection fraction, by estimation, is >75%. The left ventricle has hyperdynamic function. The left ventricle has no regional wall motion abnormalities. The left ventricular internal cavity size was small. There is severe left ventricular hypertrophy. Right Ventricle: The right ventricular size is normal. No increase in right ventricular wall thickness. Right ventricular systolic function is normal. There is mildly elevated pulmonary artery systolic pressure. The tricuspid regurgitant velocity is 3.14  m/s, and with  an assumed right atrial pressure of 3 mmHg, the estimated right ventricular systolic pressure is 78.9 mmHg. Pericardium: Trivial pericardial effusion is present. Mitral Valve: The mitral valve is abnormal. There is mild thickening of the mitral valve leaflet(s). Moderate mitral annular calcification. Tricuspid Valve: The tricuspid valve is grossly normal. Tricuspid valve regurgitation is mild to moderate. Aortic Valve: The aortic valve is abnormal. Aortic valve regurgitation is mild to moderate. Mild aortic valve sclerosis is present, with no evidence of aortic valve stenosis.  Pulmonic Valve: The pulmonic valve was normal in structure. Pulmonic valve regurgitation is not visualized. Aorta: The aortic root and ascending aorta are structurally normal, with no evidence of dilitation. Venous: The inferior vena cava is normal in size with greater than 50% respiratory variability, suggesting right atrial pressure of 3 mmHg.  LEFT VENTRICLE PLAX 2D LVIDd:         3.10 cm LVIDs:         1.60 cm LV PW:         1.70 cm LV IVS:        1.60 cm LVOT diam:     1.90 cm LVOT Area:     2.84 cm  IVC IVC diam: 1.90 cm LEFT ATRIUM         Index LA diam:    4.70 cm 2.40 cm/m   AORTA Ao Root diam: 3.50 cm Ao Asc diam:  3.50 cm TRICUSPID VALVE TR Peak grad:   39.4 mmHg TR Vmax:        314.00 cm/s  SHUNTS Systemic Diam: 1.90 cm Dorris Carnes MD Electronically signed by Dorris Carnes MD Signature Date/Time: 12/30/2019/7:38:12 PM    Final     PHYSICAL EXAM   Temp:  [97.8 F (36.6 C)-99.8 F (37.7 C)] 97.8 F (36.6 C) (07/02 0800) Pulse Rate:  [58-74] 62 (07/02 0800) Resp:  [16-25] 20 (07/02 0800) BP: (117-153)/(50-82) 140/64 (07/02 0800) SpO2:  [96 %-100 %] 98 % (07/02 0800) Weight:  [92.4 kg] 92.4 kg (07/02 0500)  General - Well nourished, well developed, not in acute distress.  Ophthalmologic - fundi not visualized due to noncooperation.  Cardiovascular - irregularly irregular heart rate and rhythm.  Neuro - awake alert,  but not following commands due to deafness. Able to say "stop" "it is my leg not your leg" with pain stimulation, however, severe dysarthria. Eyes right forced gaze,  blinking to visual threat on the right but not on the left, not tracking, PERRL. Left facial droop.  Tongue protrusion not cooperative. Purposeful movement of RUE against gravity. On pain stimulation, localizing to pain with RUE, withdraw 2/5 RLE, no movement of LUE, and slight withdraw LLE. DTR diminished on the left and left positive babinski. Sensation, coordination and gait not tested.  ASSESSMENT/PLAN Gloria [name] is a 74 y.o. female with history of bilateral ureteral calculi, HTN, Atrial Fibrillation not on anticoagulation (not on [facility] due to high risk of falls), who presents with left sided hemiplegia and right gaze preference. CTH in the ED revealed left frontal meningioma, CTA with a right M1 occlusion. She received IVTPA on 12/29/19 at 1830 w/LKW 1645 and was taken for mechanical thrombectomy with resulting TICI 2B revascularization w/placement of recue stent in the right MCA.  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 Small vessel disease. Atrophy. ASPECTS 10.     CTA head & neck R M1 occlusion, fetal PCA on the right  Cerebral angio R M1 occlusion w/p TICI 2b revascularization. 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    Post IR CT hyperdensity R subarachnoid space, stable L parafalcine meningioma  MRI  R occipital infarct. R insular and frontal operculum infarct. Small scattered R frontal and parietal infarcts.  MRA  fetal R PCA. Missing superior division R MCA. R  MCA stent, inferior division with flow.  LE Doppler  No DVT  2D Echo EF > 75%, severe LVH. Mild to moderate TVR.  LDL 90  HgbA1c 5.2  Heparin subq for VTE  prophylaxis  aspirin 325 mg daily prior to admission, now on aspirin 81 mg daily and Brilinta (ticagrelor) 90 mg bid given stent placement. Plant to continue Brilinta at d/c with [facility] in 5-7 days (around 01/09/20 if anemia stable).   Therapy recommendations:  CIR  Disposition:  pending   Acute Respiratory Failure  intubated for IR, left intubated for airway protection  Extubation 6/29  Tolerated well  Hematoma R groin  pseudoanerysm w/ extravasation following IR  CT abd & pelvis R inguinal hematoma into abdominal wall. Stable B ureteral stents and B adrenal adenoma. Aortic atherosclerosis.    Hgb 9.2->8.8->8.4->7.8->7.3 - 1u PRBC - 8.7->8.6->9.3  [place] right groin x 2 - no pseudoaneurysm   Hematuria, resolved  S/p B ureteral stents exchange 6/4 d/t B renal calculi  ABD CT Mild B hydronephrosis, stents in place  Hgb 9.2->8.8->8.4->7.8->7.3 - 1u PRBC - 8.7->8.6->9.3  Cre 1.9->1.5->1.44->1.88->1.64->1.49->1.32  Urology consulted, appreciate help  Ureteral stents removed by urology 6/30 followed with 1 dose cipro  ongoing Cre monitor, if elevated, consider [place] abd to rule out hydro  No gross hematuria now  Atrial Fibrillation  Home anticoagulation:  none d/t high fall risk  Not timing for [facility] at this time  Plan with [facility] in 5-7 days (around 01/09/20) if anemia stable   Left leg swelling  LE venous doppler no DVT  XR of left knee severe OA and hip neg  On heparin subq for DVT prophylaxis   Continue SCDs  Hypertension  Home meds:  Coreg 6.25 bid  Stable 110-140  SBP goal now < 180  Long-term BP goal normotensive  Hyperlipidemia  Home meds:  No statin  LDL 90, goal < 70  lipitor 40 now  Continue statin at discharge  Dysphagia  Secondary to stroke  NPO -> pass swallow -> dys1 with nectar thick  Speech on board  Cortrak placed  On TF @ 50 -> 25 to facilitate oral intake  Continue IVF @ 25   CKD stage IIIab  Cre  1.49->1.76->1.50->1.88->1.64->1.49->1.32  Could be due to contrast nephrotoxicity  On IVF and TF  Encourage oral intake  Continue monitoring BMP  Other Stroke Risk Factors  Advanced age  Obesity, Body mass index is 38.59 kg/m., recommend weight loss, diet and exercise as appropriate   Other Active Problems  urinary incontinence on mirabegron PTA  [facility] day # 5  This patient is critically ill due to right MCA stroke, right MCA occlusion, status post TPA and thrombectomy, intracranial stent, hematuria A. fib, respirate failure and at significant risk of neurological worsening, death form recurrent stroke, hemorrhagic conversion, seizure, heart failure, hypotension. This patient's care requires constant monitoring of vital signs, hemodynamics, respiratory and cardiac monitoring, review of multiple databases, neurological assessment, discussion with family, other specialists and medical decision making of high complexity. I spent 35 minutes of neurocritical care time in the care of this patient.    Rosalin Hawking, MD PhD Stroke Neurology 01/03/2020 9:07 AM    To contact Stroke Continuity provider, please refer to [email or web address] After hours, contact General Neurology

## 2020-01-04 ENCOUNTER — Inpatient Hospital Stay (HOSPITAL_COMMUNITY): Payer: PPO

## 2020-01-04 LAB — GLUCOSE, CAPILLARY
Glucose-Capillary: 113 mg/dL — ABNORMAL HIGH (ref 70–99)
Glucose-Capillary: 115 mg/dL — ABNORMAL HIGH (ref 70–99)
Glucose-Capillary: 119 mg/dL — ABNORMAL HIGH (ref 70–99)
Glucose-Capillary: 123 mg/dL — ABNORMAL HIGH (ref 70–99)
Glucose-Capillary: 129 mg/dL — ABNORMAL HIGH (ref 70–99)
Glucose-Capillary: 129 mg/dL — ABNORMAL HIGH (ref 70–99)

## 2020-01-04 NOTE — Progress Notes (Addendum)
Pt with yellow MEWS due to LOC. Pt was previously yellow and remains so currently. Spoke with Dr. Rory Percy who confirms that pt has only been responding to voice.    01/04/20 2305  Assess: MEWS Score  Temp 98.8 F (37.1 C)  BP (!) 141/74  Pulse Rate 79  ECG Heart Rate 79  Resp (!) 21  Level of Consciousness Responds to Voice  SpO2 99 %  O2 Device Room Air  Assess: MEWS Score  MEWS Temp 0  MEWS Systolic 0  MEWS Pulse 0  MEWS RR 1  MEWS LOC 1  MEWS Score 2  MEWS Score Color Yellow  Assess: if the MEWS score is Yellow or Red  Were vital signs taken at a resting state? Yes  Focused Assessment Documented focused assessment  Early Detection of Sepsis Score *See Row Information* Low  MEWS guidelines implemented *See Row Information* No, previously yellow, continue vital signs every 4 hours  Treat  MEWS Interventions Escalated (See documentation below)  Escalate  MEWS: Escalate Yellow: discuss with charge nurse/RN and consider discussing with provider and RRT  Notify: Charge Nurse/RN  Name of Charge Nurse/RN Notified Raymond RN  Date Charge Nurse/RN Notified 01/04/20  Time Charge Nurse/RN Notified 2308

## 2020-01-04 NOTE — Progress Notes (Signed)
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  Bowel movement yesterday.  Grimaces some to deep palpation in the abdomen.    OBJECTIVE Temp:  [97.9 F (36.6 C)-99.5 F (37.5 C)] 99.5 F (37.5 C) (07/03 1214) Pulse Rate:  [59-77] 74 (07/03 1214) Cardiac Rhythm: Atrial fibrillation;Bundle branch block (07/03 0705) Resp:  [12-36] 20 (07/03 1301) BP: (122-158)/(46-70) 158/69 (07/03 1214) SpO2:  [97 %-100 %] 100 % (07/03 1214) Weight:  [99.2 kg] 99.2 kg (07/03 0422)  Recent Labs  Lab 01/03/20 1937 01/03/20 2322 01/04/20 0419 01/04/20 0730 01/04/20 1129  GLUCAP 121* 118* 113* 115* 129*   Recent Labs  Lab 12/30/19 0500 12/30/19 0500 12/31/19 0342 12/31/19 0342 12/31/19 1211 12/31/19 1655 01/01/20 0615 01/02/20 0535 01/03/20 0442  NA 141  --  141  --   --   --  142 141 140  K 4.5  --  4.7  --   --   --  4.3 4.2 4.4  CL 113*  --  112*  --   --   --  112* 108 108  CO2 22  --  19*  --   --   --  22 25 23   GLUCOSE 149*  --  103*  --   --   --  125* 119* 126*  BUN 28*  --  33*  --   --   --  32* 31* 31*  CREATININE 1.44*  --  1.88*  --   --   --  1.64* 1.49* 1.32*  CALCIUM 7.9*   < > 8.0*   < >  --   --  8.1* 8.1* 8.4*  MG  --   --  1.9  --  1.8 1.8 1.8  --   --   PHOS  --   --   --   --  4.0 3.5 3.3  --   --    < > = values in this interval not displayed.   Recent Labs  Lab 12/29/19 1802  AST 17  ALT 11  ALKPHOS 82  BILITOT 0.6  PROT 6.2*  ALBUMIN 3.2*   Recent Labs  Lab 12/29/19 1802 12/29/19 1846 12/30/19 0500 12/30/19 0500 12/31/19 0342 01/01/20 0615 01/01/20 1446 01/02/20 0535 01/03/20 0442  WBC 5.8   < > 7.8  --  10.4 6.0  --  5.1 5.2  NEUTROABS 3.9  --  7.3  --   --   --   --   --   --   HGB 11.8*   < > 8.4*   < > 7.8* 7.3* 8.7* 8.6* 9.3*  HCT 38.3   < > 26.6*   < > 24.7* 23.5* 27.3* 27.2* 29.4*  MCV 104.4*   < > 103.1*  --  103.8* 104.9*  --  103.0* 103.2*  PLT 225   < > 200  --  223 157   --  175 212   < > = values in this interval not displayed.   No results for input(s): CKTOTAL, CKMB, CKMBINDEX, TROPONINI in the last 168 hours. No results for input(s): LABPROT, INR in the last 72 hours. No results for input(s): COLORURINE, LABSPEC, [place], GLUCOSEU, HGBUR, BILIRUBINUR, KETONESUR, PROTEINUR, UROBILINOGEN, NITRITE, LEUKOCYTESUR in the last 72 hours.  Invalid input(s): APPERANCEUR     Component Value Date/Time   CHOL 143 12/30/2019 0500   TRIG 110 01/01/2020 0615   HDL 41 12/30/2019 0500   CHOLHDL 3.5  12/30/2019 0500   VLDL 12 12/30/2019 0500   LDLCALC 90 12/30/2019 0500   Lab Results  Component Value Date   HGBA1C 5.2 12/30/2019   No results found for: LABOPIA, COCAINSCRNUR, LABBENZ, AMPHETMU, THCU, LABBARB  No results for input(s): ETH in the last 168 hours.  I have personally reviewed the radiological images below and agree with the radiology interpretations.  CT ABDOMEN PELVIS WO CONTRAST  Result Date: 12/29/2019 CLINICAL DATA:  Right inguinal hematoma, recent neuro interventional procedure, assess for retroperitoneal hematoma EXAM: CT ABDOMEN AND PELVIS WITHOUT CONTRAST TECHNIQUE: Multidetector CT imaging of the abdomen and pelvis was performed following the standard protocol without IV contrast. COMPARISON:  11/08/2019 FINDINGS: Lower chest: Hypoventilatory changes are seen within the dependent lower lobes. Heart is enlarged with trace pericardial fluid unchanged. Hepatobiliary: Gallbladder surgically absent. Unenhanced imaging the liver demonstrates no gross abnormalities. Pancreas: Unremarkable. No pancreatic ductal dilatation or surrounding inflammatory changes. Spleen: Normal in size without focal abnormality. Adrenals/Urinary Tract: Excreted contrast is seen within the bilateral kidneys related to previous neuro interventional procedure. Bilateral renal cortical thinning is noted. Stable bilateral adrenal adenoma. Bilateral ureteral stents extend from the renal  pelves into the bladder lumen. No filling defects within the bladder. Stomach/Bowel: Enteric catheter extends into the gastric lumen. No bowel obstruction or ileus. No bowel wall thickening or inflammatory change. Vascular/Lymphatic: Aortic atherosclerosis. No enlarged abdominal or pelvic lymph nodes. Reproductive: Uterus and bilateral adnexa are unremarkable. Other: Hematoma is seen within the right inguinal region extending along the inguinal crease into the pannus of the right lower quadrant abdominal wall. This measures up to 3.7 cm in thickness. There is no evidence of extension into the retroperitoneal space. At the time of the exam, extrinsic compression is being held at the right groin. There is no free fluid or free gas within the peritoneal cavity. Fat containing hernia again noted within the left lower quadrant abdominal wall. No bowel herniation. Musculoskeletal: No acute or destructive bony lesions. Reconstructed images demonstrate no additional findings. IMPRESSION: 1. Hematoma within the right inguinal region extending along the inguinal crease into the pannus of the right lower quadrant abdominal wall. No extension into the retroperitoneal space. 2. Bilateral ureteral stents as above. 3. Stable bilateral adrenal adenoma. 4. Aortic Atherosclerosis (ICD10-I70.0). Electronically Signed   By: Randa Ngo M.D.   On: 12/29/2019 23:48   CT Code Stroke CTA Head W/WO contrast  Result Date: 12/29/2019 CLINICAL DATA:  Left-sided weakness, code stroke follow-up EXAM: CT ANGIOGRAPHY HEAD AND NECK TECHNIQUE: Multidetector CT imaging of the head and neck was performed using the standard protocol during bolus administration of intravenous contrast. Multiplanar CT image reconstructions and MIPs were obtained to evaluate the vascular anatomy. Carotid stenosis measurements (when applicable) are obtained utilizing NASCET criteria, using the distal internal carotid diameter as the denominator. CONTRAST:  77mL  OMNIPAQUE IOHEXOL 350 MG/ML SOLN COMPARISON:  None. FINDINGS: CTA NECK Aortic arch: Minimal calcified plaque along the aortic arch. Mild calcified plaque at the great vessel origins, which are patent. Right carotid system: Patent. Common carotid has a retropharyngeal course. Mild calcified plaque at the ICA origin causing minimal stenosis. Left carotid system: Patent. Common carotid has a retropharyngeal course. Mild calcified plaque at the ICA origin causing minimal stenosis. Vertebral arteries: Patent. Left vertebral artery is dominant. Suspected stenosis of the proximal right vertebral artery. Skeleton: Degenerative changes of the cervical spine. Other neck: No mass or adenopathy. Upper chest: Enlargement of the main pulmonary artery suggesting pulmonary arterial hypertension. Review  of the MIP images confirms the above findings CTA HEAD Anterior circulation: Intracranial internal carotid arteries are patent with calcified plaque causing mild stenosis. Anterior cerebral arteries are patent. Left A1 ACA is dominant with diminutive or absent right A1 segment. There is occlusion of the mid right M1 MCA. There is preserved opacification of the more distal right MCA territory. Left MCA is patent. Posterior circulation: Intracranial vertebral arteries are patent. Basilar artery is patent. Posterior cerebral arteries are patent. There are posterior communicating arteries present bilaterally with fetal origin of the right PCA. Venous sinuses: Patent as allowed by contrast bolus timing. Review of the MIP images confirms the above findings IMPRESSION: Occlusion of the right M1 MCA. However, there is good filling of the more distal MCA territory. No hemodynamically significant stenosis in the neck. These results were communicated to Dr. Lorraine Lax at [place] 6/27/2021by text page via the [facility] messaging system. Electronically Signed   By: Macy Mis M.D.   On: 12/29/2019 19:10   CT HEAD WO CONTRAST  Result Date:  12/29/2019 CLINICAL DATA:  Stroke follow-up.  Status post thrombectomy EXAM: CT HEAD WITHOUT CONTRAST TECHNIQUE: Contiguous axial images were obtained from the base of the skull through the vertex without intravenous contrast. COMPARISON:  Head CT 12/29/2019 FINDINGS: Brain: There is hyperdense material in the right subarachnoid space, overlying the frontal operculum and within the sylvian fissure. No other extra-axial collection. No midline shift or other mass effect. No intraparenchymal hemorrhage. 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 Vascular: Status post right MCA stent placement. Skull: Normal. Negative for fracture or focal lesion. Sinuses/Orbits: No acute finding. Other: None. IMPRESSION: 1. Hyperdense material in the right subarachnoid space, overlying the frontal operculum and within the Sylvian fissure. This is most likely contrast staining, but follow-up studies will be necessary to differentiate from subarachnoid hemorrhage. 2. Unchanged left parafalcine meningioma. Electronically Signed   By: Ulyses Jarred M.D.   On: 12/29/2019 23:49   CT Code Stroke CTA Neck W/WO contrast  Result Date: 12/29/2019 CLINICAL DATA:  Left-sided weakness, code stroke follow-up EXAM: CT ANGIOGRAPHY HEAD AND NECK TECHNIQUE: Multidetector CT imaging of the head and neck was performed using the standard protocol during bolus administration of intravenous contrast. Multiplanar CT image reconstructions and MIPs were obtained to evaluate the vascular anatomy. Carotid stenosis measurements (when applicable) are obtained utilizing NASCET criteria, using the distal internal carotid diameter as the denominator. CONTRAST:  11mL OMNIPAQUE IOHEXOL 350 MG/ML SOLN COMPARISON:  None. FINDINGS: CTA NECK Aortic arch: Minimal calcified plaque along the aortic arch. Mild calcified plaque at the great vessel origins, which are patent. Right carotid system: Patent. Common carotid has a retropharyngeal course. Mild calcified plaque at the  ICA origin causing minimal stenosis. Left carotid system: Patent. Common carotid has a retropharyngeal course. Mild calcified plaque at the ICA origin causing minimal stenosis. Vertebral arteries: Patent. Left vertebral artery is dominant. Suspected stenosis of the proximal right vertebral artery. Skeleton: Degenerative changes of the cervical spine. Other neck: No mass or adenopathy. Upper chest: Enlargement of the main pulmonary artery suggesting pulmonary arterial hypertension. Review of the MIP images confirms the above findings CTA HEAD Anterior circulation: Intracranial internal carotid arteries are patent with calcified plaque causing mild stenosis. Anterior cerebral arteries are patent. Left A1 ACA is dominant with diminutive or absent right A1 segment. There is occlusion of the mid right M1 MCA. There is preserved opacification of the more distal right MCA territory. Left MCA is patent. Posterior circulation: Intracranial vertebral arteries are  patent. Basilar artery is patent. Posterior cerebral arteries are patent. There are posterior communicating arteries present bilaterally with fetal origin of the right PCA. Venous sinuses: Patent as allowed by contrast bolus timing. Review of the MIP images confirms the above findings IMPRESSION: Occlusion of the right M1 MCA. However, there is good filling of the more distal MCA territory. No hemodynamically significant stenosis in the neck. These results were communicated to Dr. Lorraine Lax at [place] 6/27/2021by text page via the [facility] messaging system. Electronically Signed   By: Macy Mis M.D.   On: 12/29/2019 19:10   MR ANGIO HEAD WO CONTRAST  Result Date: 12/30/2019 CLINICAL DATA:  Status post right M1 occlusion with intervention EXAM: MRI HEAD WITHOUT CONTRAST MRA HEAD WITHOUT CONTRAST TECHNIQUE: Multiplanar, multiecho pulse sequences of the brain and surrounding structures were obtained without intravenous contrast. Angiographic images of the head were  obtained using MRA technique without contrast. COMPARISON:  Multiple CT studies done yesterday. FINDINGS: MRI HEAD FINDINGS Brain: Diffusion imaging shows acute infarction affecting the right occipital cortex. Acute infarction affects the right insular region and frontal operculum. Small foci of acute infarction elsewhere in the right frontal and parietal regions. Areas of infarction show mild swelling but there is no mass effect. Small focus of susceptibility in the right sylvian fissure region may be intravascular. Few small foci of susceptibility in the right parietal region also suspected to be intravascular. No sign of parenchymal hematoma. No hydrocephalus. No extra-axial collection. Mild chronic small-vessel ischemic changes elsewhere throughout the cerebral hemispheric white matter. 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 Vascular: Stent in the right M1 region. Skull and upper cervical spine: Negative Sinuses/Orbits: Clear/normal Other: None MRA HEAD FINDINGS Both internal carotid arteries show antegrade flow through the skull base. On the left, there is supply in the left MCA territory and both anterior cerebral artery territories. Moderate atherosclerotic irregularity of the MCA branches. On the right, there is signal loss related to the right MCA stent. More distal MCA branch vessels do show supply, with absence of the superior division. Right vertebral artery terminates in PICA. Left vertebral artery supplies the basilar. 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 Left PCA arises from the basilar tip. Distal vessel atherosclerotic irregularity. Right PCA arises from the right carotid and shows poor supply of the distal branches. 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 Fetal origin right PCA with poor flow in the distal branch vessels. Acute infarction of the right insular region and  frontal operculum. Missing superior division right MCA. Small scattered other acute infarctions in the right frontal and parietal regions. Swelling but no mass effect. No intraparenchymal hematoma. Few small foci of petechial blood versus vascular susceptibility signal. Right MCA stent. Missing superior division as noted above. Inferior division shows flow. Electronically Signed   By: Nelson Chimes M.D.   On: 12/30/2019 19:21   MR BRAIN WO CONTRAST  Result Date: 12/30/2019 CLINICAL DATA:  Status post right M1 occlusion with intervention EXAM: MRI HEAD WITHOUT CONTRAST MRA HEAD WITHOUT CONTRAST TECHNIQUE: Multiplanar, multiecho pulse sequences of the brain and surrounding structures were obtained without intravenous contrast. Angiographic images of the head were obtained using MRA technique without contrast. COMPARISON:  Multiple CT studies done yesterday. FINDINGS: MRI HEAD FINDINGS Brain: Diffusion imaging shows acute infarction affecting the right occipital cortex. Acute infarction affects the right insular region and frontal operculum. Small foci of acute infarction elsewhere in  the right frontal and parietal regions. Areas of infarction show mild swelling but there is no mass effect. Small focus of susceptibility in the right sylvian fissure region may be intravascular. Few small foci of susceptibility in the right parietal region also suspected to be intravascular. No sign of parenchymal hematoma. No hydrocephalus. No extra-axial collection. Mild chronic small-vessel ischemic changes elsewhere throughout the cerebral hemispheric white matter. 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 Vascular: Stent in the right M1 region. Skull and upper cervical spine: Negative Sinuses/Orbits: Clear/normal Other: None MRA HEAD FINDINGS Both internal carotid arteries show antegrade flow through the skull base. On the left, there is supply in the  left MCA territory and both anterior cerebral artery territories. Moderate atherosclerotic irregularity of the MCA branches. On the right, there is signal loss related to the right MCA stent. More distal MCA branch vessels do show supply, with absence of the superior division. Right vertebral artery terminates in PICA. Left vertebral artery supplies the basilar. 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 Left PCA arises from the basilar tip. Distal vessel atherosclerotic irregularity. Right PCA arises from the right carotid and shows poor supply of the distal branches. 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 Fetal origin right PCA with poor flow in the distal branch vessels. Acute infarction of the right insular region and frontal operculum. Missing superior division right MCA. Small scattered other acute infarctions in the right frontal and parietal regions. Swelling but no mass effect. No intraparenchymal hematoma. Few small foci of petechial blood versus vascular susceptibility signal. Right MCA stent. Missing superior division as noted above. Inferior division shows flow. Electronically Signed   By: Nelson Chimes M.D.   On: 12/30/2019 19:21   IR Intra Cran Stent  Result Date: 12/31/2019 INDICATION: New onset left-sided hemiplegia, right gaze deviation. Occluded right middle cerebral M1 segment on CT angiogram of the head and neck. EXAM: 1. EMERGENT LARGE VESSEL OCCLUSION THROMBOLYSIS (anterior CIRCULATION) COMPARISON:  CT angiogram of the head and neck of June, 27, 2021. MEDICATIONS: Vancomycin 1 g IV was administered within 1 hour of the procedure. ANESTHESIA/SEDATION: General anesthesia CONTRAST:  Isovue 300 approximately 170 mL FLUOROSCOPY TIME:  Fluoroscopy Time: 131 minutes 12 seconds (4835 mGy). COMPLICATIONS: None immediate. TECHNIQUE: Following a full explanation of the procedure along with the potential associated complications, an informed witnessed consent was  obtained the patient's son. The risks of intracranial hemorrhage of 10%, worsening neurological deficit, ventilator dependency, death and inability to revascularize were all reviewed in detail with the patient's son. The patient was then put under general anesthesia by the Department of Anesthesiology at [facility]. The right groin was prepped and draped in the usual sterile fashion. Thereafter using modified Seldinger technique, transfemoral access into the right common femoral artery was obtained without difficulty. Over a 0.035 inch guidewire an 8 French 25 cm Pinnacle sheath was inserted. Through this, and also over a 0.035 inch guidewire a combination of a select 5 French 125 cm Simmons 2 catheter inside of a 95 cm 087 balloon guide catheter was advanced without difficulty to the right common carotid artery. The guidewire and the select catheter were removed. Good aspiration was obtained from the hub of the balloon guide catheter just proximal to the right common carotid artery bifurcation. Arteriogram was then performed centered extra cranially and intracranially. FINDINGS: The right common carotid arteriogram demonstrates the right external carotid artery and its  major branches to be widely patent. The right internal carotid artery at the bulb demonstrates a smooth shallow plaque. Moderate tortuosity was noted of the proximal right internal carotid artery. 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 Distal to this the cervical petrous junction demonstrates normal opacification. Distal to this the petrous, cavernous, and the cavernous segments demonstrate patency as does the supraclinoid segment. Opacification is seen of the right posterior communicating artery opacifying the  right posterior cerebral distribution. The right middle cerebral artery demonstrates complete angiographic occlusion at the origin of the anterior temporal branch. Focal areas of caliber irregularity are seen of the proximal right middle cerebral artery. PROCEDURE: Through the 087 balloon guide catheter in the proximal right internal carotid artery, a Zoom 071 135 cm catheter inside of which was an 021 160 cm Phenom microcatheter was advanced over a 0.014 inch standard Synchro micro guidewire gingerly through the right internal carotid artery in the neck and into the supraclinoid right ICA. The micro guidewire was then gently manipulated with a torque device and advanced into the inferior division M2 M3 region of the right middle cerebral artery followed by the microcatheter. The guidewire was removed. Good aspiration was obtained from the hub of the microcatheter. Gentle contrast injection demonstrated safe position of tip of the microcatheter. A 5 mm x 37 mm Embotrap retrieval device was then advanced to the distal end of the microcatheter. The O ring on the delivery micro guidewire was loosened. With slight forward gentle traction with the right hand on the delivery micro guidewire, with the left hand the retrieval device was deployed. The 071 Zoom catheter was then advanced into the right middle cerebral artery engaging the occluded right middle cerebral artery. With constant flow arrest in the right internal carotid artery, and constant aspiration using a Penumbra device at the hub of the 071 Zoom catheter for approximately 3 minutes, the combination of the retrieval device, the microcatheter, and the Zoom aspiration catheter were retrieved and removed. Following reversal of flow arrest, a control arteriogram performed through the balloon guide catheter in the right internal carotid artery demonstrates revascularization of the right middle cerebral artery and also of the superior and inferior divisions. The  anterior temporal branch remains patent. A TICI 2B revascularization was achieved. This also unmasked an irregular filling defect at the right middle cerebral artery terminus at the trifurcation region with flow noted in the distal inferior division. A second pass was then made again with the combination of the 071 Zoom catheter advanced over an 021 160 cm Phenom microcatheter over a 0.014 inch standard Synchro micro guidewire. Again access was obtained into the distal M2 M3 region of the inferior division with the micro guidewire then followed by the microcatheter. The guidewire was removed. Again safe position of the tip of the microcatheter was confirmed and connected to continuous heparinized saline infusion. A 4 mm x 40 mm Solitaire X retrieval device was then deployed in the manner described above. Again with proximal flow arrest in the right internal carotid artery proximally, and constant aspiration via the Zoom catheter which was now imbedded in the occluded right middle cerebral artery at its trifurcation region over approximately 2-1/2 minutes, the combination of the retrieval device, the microcatheter, and the Zoom catheter was retrieved and removed. Following reversal of flow arrest, a  control arteriogram performed through the right internal carotid artery demonstrated improved opacification of the right MCA trifurcation branches. There continued be occluded superior division. The combination of the microcatheter, inside a 071 Zoom aspiration catheter was again advanced to the right middle cerebral artery over a 0.014 inch standard Synchro micro guidewire, which was now advanced into the superior division of the right middle cerebral artery emanating at the MCA trifurcation region. The microcatheter was advanced to the M2 region over a micro guidewire. The micro guidewire was removed. Good aspiration obtained from the hub of the microcatheter. Again with flow arrest in the right internal carotid artery  proximally, and constant aspiration with the Penumbra aspiration device at the hub of the aspiration catheter following deployment of a 3 mm x 20 mm Solitaire X retrieval device, for 2 minutes, the combination of the retrieval device, the microcatheter and the Zoom catheter were retrieved and removed. The superior division continued to be occluded. The superior division was again cannulated with a microcatheter over a micro guidewire as described above. After having verified safe position of the tip of the microcatheter, in the superior division M2 region, a 4 mm x 40 mm Solitaire X retrieval device was deployed in the usual manner. Again with proximal flow arrest in the proximal right ICA, and constant aspiration at the hub of the Zoom catheter at the site of the occluded superior division in the distal right middle cerebral M1 segment over 2 minutes, the combination of the retrieval device, the microcatheter and the retrieval device were retrieved and removed. Following reverse of flow arrest, there appeared to be modest improved flow in the proximal superior division. It was now noted the inferior division had a near occlusive clot in the proximal aspect. A fourth pass was then made this time using a combination of the Phenom microcatheter, inside of a 6 [place] Catalyst 135 cm catheter advanced over a 0.014 inch standard Synchro micro guidewire to the distal right M1 segment. The micro guidewire was then advanced into the M2 M3 region of the inferior division followed by the microcatheter. The guidewire was removed. A 4 mm x 40 mm Solitaire X retrieval device was deployed with the Catalyst guide catheter advanced at the origin of inferior division and distal to this. Following proximal flow arrest and constant aspiration for approximately 2 minutes at the hub of the Catalyst guide catheter, the combination of the retrieval device, the microcatheter and the Catalyst catheter were retrieved and removed following  reversal of flow arrest. Improved flow was now noted into the inferior division proximally through the two main branch points. Following each thrombectomy, strips of fragments were seen intertwined either in the retrieval device, or in the aspiration catheters. 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 It was therefore decided to proceed with placement of a rescue stent in order to prevent occlusion of the major right MCA inferior division branch. The microcatheter was again advanced over a 0.014 inch standard Synchro micro guidewire with a 6 [place] Catalyst guide catheter. The microcatheter was advanced over a micro guidewire without difficulty into the M2 region followed by the removal of the micro guidewire. Again good aspiration obtained from the hub of the microcatheter. This was then connected to continuous heparinized saline infusion. Measurements performed of the right middle cerebral artery in the mid M1 segment, and also the proximal inferior division distal to the severe stenosis. A 4 mm x 24 mm Neuroform  Atlas stent was advanced to the distal end of the microcatheter. The O ring on the delivery microcatheter was then loosened. With slight forward gentle traction with the right hand on the delivery micro guidewire with left hand the delivery microcatheter was gently retrieved unsheathing the distal and then the proximal portion of the stent with excellent coverage at the site of the severe stenosis. The delivery apparatus was removed. A control arteriogram performed through the balloon guide in the distal cervical ICA on the right demonstrated now significantly improved caliber and flow through the right middle cerebral artery the dominant inferior division. There was poor stagnant flow noted in the proximal superior division. Free flow through the stented segment and also the other branch of the right middle cerebral artery  except for the superior division. 8 mg of Integrilin was given intra-arterially in order to prevent intra stent platelet aggregation. A 10 minute post stent arteriogram continued to demonstrate flow through the stented segment in the right MCA distribution except for the non dominant superior division. A TICI 2b revascularization was maintained. The right posterior communicating artery with the right posterior cerebral artery distribution remained unchanged. Throughout the procedure, the patient's blood pressure and neurological status remained stable. A CT of the brain performed following the third pass demonstrated no mass-effect or midline shift with mild element of contrast in the right perisylvian region. The balloon guide was then retrieved and removed. The 8 French 25 cm Pinnacle sheath was then exchanged over a 0.035 inch J-tip guidewire for a short 8 [place] Pinnacle sheath. An arteriogram performed through this demonstrated spasm in the previously noted tortuous right common iliac artery. There was small amount of contrast noted in the soft tissue at the site of entry of the 8 [place] Pinnacle sheath which completely disappeared on repeat arteriogram performed approximately 3 minutes later. The 8 French sheath was removed and a 7 [place] ExoSeal closure device was placed. Also minimal compression was held for approximately 25 minutes. The distal pulses remained Dopplerable in the dorsalis pedis, and the posterior tibial regions bilaterally. Patient was left intubated on account of the patient's inability to communicate due to difficulty hearing and also her neurological condition. During this time, while pressure was held, and firmness was felt in the region of the pannus on the right side in the right lower quadrant, and also in the right inguinal region. In view of the unstable blood pressure, the patient was sent to CT scan for CT of the pelvis and also of the abdomen and also CT of the brain. CT of the  brain revealed no evidence of mass effect or midline shift with no significant change in the right perisylvian hyperattenuation felt to be due to a contrast stain. CT of the abdomen and pelvis revealed no evidence of the overlying skin tightness. Hemodynamically the patient was now stable with blood pressure in the 150s over 80s and the heart rate being regular in the 80s to 90s sinus rhythm. Patient was then transferred to the neuro ICU intubated for post thrombectomy management. IMPRESSION: Status post endovascular revascularization of occluded right middle cerebral M1 segment with 4 passes with different stent retrievers and Penumbra aspiration achieving a TICI 2B revascularization. Status post rescue stent placement from the distal M1 segment into the proximal dominant inferior division of the right middle cerebral artery due to underlying severe intracranial arteriosclerosis. PLAN: Follow-up in the clinic 4 weeks post discharge. Electronically Signed   By: Luanne Bras M.D.   On:  12/30/2019 17:15   IR CT Head Ltd  Result Date: 12/31/2019 INDICATION: New onset left-sided hemiplegia, right gaze deviation. Occluded right middle cerebral M1 segment on CT angiogram of the head and neck. EXAM: 1. EMERGENT LARGE VESSEL OCCLUSION THROMBOLYSIS (anterior CIRCULATION) COMPARISON:  CT angiogram of the head and neck of June, 27, 2021. MEDICATIONS: Vancomycin 1 g IV was administered within 1 hour of the procedure. ANESTHESIA/SEDATION: General anesthesia CONTRAST:  Isovue 300 approximately 170 mL FLUOROSCOPY TIME:  Fluoroscopy Time: 131 minutes 12 seconds (4835 mGy). COMPLICATIONS: None immediate. TECHNIQUE: Following a full explanation of the procedure along with the potential associated complications, an informed witnessed consent was obtained the patient's son. The risks of intracranial hemorrhage of 10%, worsening neurological deficit, ventilator dependency, death and inability to revascularize were all reviewed  in detail with the patient's son. The patient was then put under general anesthesia by the Department of Anesthesiology at [facility]. The right groin was prepped and draped in the usual sterile fashion. Thereafter using modified Seldinger technique, transfemoral access into the right common femoral artery was obtained without difficulty. Over a 0.035 inch guidewire an 8 French 25 cm Pinnacle sheath was inserted. Through this, and also over a 0.035 inch guidewire a combination of a select 5 French 125 cm Simmons 2 catheter inside of a 95 cm 087 balloon guide catheter was advanced without difficulty to the right common carotid artery. The guidewire and the select catheter were removed. Good aspiration was obtained from the hub of the balloon guide catheter just proximal to the right common carotid artery bifurcation. Arteriogram was then performed centered extra cranially and intracranially. FINDINGS: The right common carotid arteriogram demonstrates the right external carotid artery and its major branches to be widely patent. The right internal carotid artery at the bulb demonstrates a smooth shallow plaque. Moderate tortuosity was noted of the proximal right internal carotid artery. 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 Distal to this the cervical petrous junction demonstrates normal opacification. Distal to this the petrous, cavernous, and the cavernous segments demonstrate patency as does the supraclinoid segment. Opacification is seen of the right posterior communicating artery opacifying the right posterior cerebral distribution. The right middle cerebral artery demonstrates complete angiographic occlusion at the origin of the anterior temporal branch. Focal areas of  caliber irregularity are seen of the proximal right middle cerebral artery. PROCEDURE: Through the 087 balloon guide catheter in the proximal right internal carotid artery, a Zoom 071 135 cm catheter inside of which was an 021 160 cm Phenom microcatheter was advanced over a 0.014 inch standard Synchro micro guidewire gingerly through the right internal carotid artery in the neck and into the supraclinoid right ICA. The micro guidewire was then gently manipulated with a torque device and advanced into the inferior division M2 M3 region of the right middle cerebral artery followed by the microcatheter. The guidewire was removed. Good aspiration was obtained from the hub of the microcatheter. Gentle contrast injection demonstrated safe position of tip of the microcatheter. A 5 mm x 37 mm Embotrap retrieval device was then advanced to the distal end of the microcatheter. The O ring on the delivery micro guidewire was loosened. With slight forward gentle traction with the right hand on the delivery micro guidewire, with the left hand the retrieval device was deployed.  The 071 Zoom catheter was then advanced into the right middle cerebral artery engaging the occluded right middle cerebral artery. With constant flow arrest in the right internal carotid artery, and constant aspiration using a Penumbra device at the hub of the 071 Zoom catheter for approximately 3 minutes, the combination of the retrieval device, the microcatheter, and the Zoom aspiration catheter were retrieved and removed. Following reversal of flow arrest, a control arteriogram performed through the balloon guide catheter in the right internal carotid artery demonstrates revascularization of the right middle cerebral artery and also of the superior and inferior divisions. The anterior temporal branch remains patent. A TICI 2B revascularization was achieved. This also unmasked an irregular filling defect at the right middle cerebral artery terminus at the  trifurcation region with flow noted in the distal inferior division. A second pass was then made again with the combination of the 071 Zoom catheter advanced over an 021 160 cm Phenom microcatheter over a 0.014 inch standard Synchro micro guidewire. Again access was obtained into the distal M2 M3 region of the inferior division with the micro guidewire then followed by the microcatheter. The guidewire was removed. Again safe position of the tip of the microcatheter was confirmed and connected to continuous heparinized saline infusion. A 4 mm x 40 mm Solitaire X retrieval device was then deployed in the manner described above. Again with proximal flow arrest in the right internal carotid artery proximally, and constant aspiration via the Zoom catheter which was now imbedded in the occluded right middle cerebral artery at its trifurcation region over approximately 2-1/2 minutes, the combination of the retrieval device, the microcatheter, and the Zoom catheter was retrieved and removed. Following reversal of flow arrest, a control arteriogram performed through the right internal carotid artery demonstrated improved opacification of the right MCA trifurcation branches. There continued be occluded superior division. The combination of the microcatheter, inside a 071 Zoom aspiration catheter was again advanced to the right middle cerebral artery over a 0.014 inch standard Synchro micro guidewire, which was now advanced into the superior division of the right middle cerebral artery emanating at the MCA trifurcation region. The microcatheter was advanced to the M2 region over a micro guidewire. The micro guidewire was removed. Good aspiration obtained from the hub of the microcatheter. Again with flow arrest in the right internal carotid artery proximally, and constant aspiration with the Penumbra aspiration device at the hub of the aspiration catheter following deployment of a 3 mm x 20 mm Solitaire X retrieval device, for  2 minutes, the combination of the retrieval device, the microcatheter and the Zoom catheter were retrieved and removed. The superior division continued to be occluded. The superior division was again cannulated with a microcatheter over a micro guidewire as described above. After having verified safe position of the tip of the microcatheter, in the superior division M2 region, a 4 mm x 40 mm Solitaire X retrieval device was deployed in the usual manner. Again with proximal flow arrest in the proximal right ICA, and constant aspiration at the hub of the Zoom catheter at the site of the occluded superior division in the distal right middle cerebral M1 segment over 2 minutes, the combination of the retrieval device, the microcatheter and the retrieval device were retrieved and removed. Following reverse of flow arrest, there appeared to be modest improved flow in the proximal superior division. It was now noted the inferior division had a near occlusive clot in the proximal aspect. A fourth pass was  then made this time using a combination of the Phenom microcatheter, inside of a 6 [place] Catalyst 135 cm catheter advanced over a 0.014 inch standard Synchro micro guidewire to the distal right M1 segment. The micro guidewire was then advanced into the M2 M3 region of the inferior division followed by the microcatheter. The guidewire was removed. A 4 mm x 40 mm Solitaire X retrieval device was deployed with the Catalyst guide catheter advanced at the origin of inferior division and distal to this. Following proximal flow arrest and constant aspiration for approximately 2 minutes at the hub of the Catalyst guide catheter, the combination of the retrieval device, the microcatheter and the Catalyst catheter were retrieved and removed following reversal of flow arrest. Improved flow was now noted into the inferior division proximally through the two main branch points. Following each thrombectomy, strips of fragments were seen  intertwined either in the retrieval device, or in the aspiration catheters. 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 It was therefore decided to proceed with placement of a rescue stent in order to prevent occlusion of the major right MCA inferior division branch. The microcatheter was again advanced over a 0.014 inch standard Synchro micro guidewire with a 6 [place] Catalyst guide catheter. The microcatheter was advanced over a micro guidewire without difficulty into the M2 region followed by the removal of the micro guidewire. Again good aspiration obtained from the hub of the microcatheter. This was then connected to continuous heparinized saline infusion. Measurements performed of the right middle cerebral artery in the mid M1 segment, and also the proximal inferior division distal to the severe stenosis. A 4 mm x 24 mm Neuroform Atlas stent was advanced to the distal end of the microcatheter. The O ring on the delivery microcatheter was then loosened. With slight forward gentle traction with the right hand on the delivery micro guidewire with left hand the delivery microcatheter was gently retrieved unsheathing the distal and then the proximal portion of the stent with excellent coverage at the site of the severe stenosis. The delivery apparatus was removed. A control arteriogram performed through the balloon guide in the distal cervical ICA on the right demonstrated now significantly improved caliber and flow through the right middle cerebral artery the dominant inferior division. There was poor stagnant flow noted in the proximal superior division. Free flow through the stented segment and also the other branch of the right middle cerebral artery except for the superior division. 8 mg of Integrilin was given intra-arterially in order to prevent intra stent platelet aggregation. A 10 minute post stent arteriogram continued to  demonstrate flow through the stented segment in the right MCA distribution except for the non dominant superior division. A TICI 2b revascularization was maintained. The right posterior communicating artery with the right posterior cerebral artery distribution remained unchanged. Throughout the procedure, the patient's blood pressure and neurological status remained stable. A CT of the brain performed following the third pass demonstrated no mass-effect or midline shift with mild element of contrast in the right perisylvian region. The balloon guide was then retrieved and removed. The 8 French 25 cm Pinnacle sheath was then exchanged over a 0.035 inch J-tip guidewire for a short 8 [place] Pinnacle sheath. An arteriogram performed through this demonstrated spasm in the previously noted tortuous right common iliac artery. There was small amount of contrast noted in the soft tissue at the site of entry of the 8 [place] Pinnacle sheath  which completely disappeared on repeat arteriogram performed approximately 3 minutes later. The 8 French sheath was removed and a 7 [place] ExoSeal closure device was placed. Also minimal compression was held for approximately 25 minutes. The distal pulses remained Dopplerable in the dorsalis pedis, and the posterior tibial regions bilaterally. Patient was left intubated on account of the patient's inability to communicate due to difficulty hearing and also her neurological condition. During this time, while pressure was held, and firmness was felt in the region of the pannus on the right side in the right lower quadrant, and also in the right inguinal region. In view of the unstable blood pressure, the patient was sent to CT scan for CT of the pelvis and also of the abdomen and also CT of the brain. CT of the brain revealed no evidence of mass effect or midline shift with no significant change in the right perisylvian hyperattenuation felt to be due to a contrast stain. CT of the abdomen  and pelvis revealed no evidence of the overlying skin tightness. Hemodynamically the patient was now stable with blood pressure in the 150s over 80s and the heart rate being regular in the 80s to 90s sinus rhythm. Patient was then transferred to the neuro ICU intubated for post thrombectomy management. IMPRESSION: Status post endovascular revascularization of occluded right middle cerebral M1 segment with 4 passes with different stent retrievers and Penumbra aspiration achieving a TICI 2B revascularization. Status post rescue stent placement from the distal M1 segment into the proximal dominant inferior division of the right middle cerebral artery due to underlying severe intracranial arteriosclerosis. PLAN: Follow-up in the clinic 4 weeks post discharge. Electronically Signed   By: Luanne Bras M.D.   On: 12/30/2019 17:15   DG CHEST PORT 1 VIEW  Result Date: 12/31/2019 CLINICAL DATA:  Status post extubation EXAM: PORTABLE CHEST 1 VIEW COMPARISON:  12/30/2019 FINDINGS: Cardiac shadow remains enlarged. Aortic calcifications are again seen. Feeding catheter is noted extending into the stomach. Endotracheal tube has been removed in the interval. The lungs are well aerated without focal infiltrate or sizable effusion. IMPRESSION: Status post extubation. No acute abnormality noted. Electronically Signed   By: Inez Catalina M.D.   On: 12/31/2019 11:27   DG Chest Port 1 View  Result Date: 12/30/2019 CLINICAL DATA:  Intubation. EXAM: PORTABLE CHEST 1 VIEW COMPARISON:  Radiograph 11/08/2019. FINDINGS: Low positioning of the endotracheal tube 6 mm from the carina. Enteric tube in place with tip below the diaphragm not included in this chest field of view. Stable cardiomegaly. Unchanged mediastinal contours. Interstitial coarsening which appears chronic. No focal airspace disease, pleural effusion, or pneumothorax. No acute osseous abnormalities are seen. IMPRESSION: 1. Low positioning of the endotracheal tube 6  mm from the carina. Retraction of 2-3 cm recommended. 2. Enteric tube in place with tip below the diaphragm. 3. Stable cardiomegaly and chronic interstitial coarsening. Electronically Signed   By: Keith Rake M.D.   On: 12/30/2019 01:11   DG Knee Left Port  Result Date: 01/02/2020 CLINICAL DATA:  Left knee pain. EXAM: PORTABLE LEFT KNEE - 1-2 VIEW COMPARISON:  None. FINDINGS: No evidence of fracture, dislocation, or joint effusion. Severe degenerative changes seen involving the medial joint space. Moderate degenerative changes seen involving the lateral joint space. Severe degenerative changes seen involving the patellofemoral space. Soft tissues are unremarkable. IMPRESSION: Severe tricompartmental osteoarthritis. No acute abnormality seen in the left knee. Electronically Signed   By: Marijo Conception M.D.   On: 01/02/2020 14:59  DG Abd Portable 1V  Result Date: 12/30/2019 CLINICAL DATA:  OG tube placement. EXAM: PORTABLE ABDOMEN - 1 VIEW COMPARISON:  Abdomen pelvis CT yesterday. FINDINGS: Tip and side port of the enteric tube below the diaphragm in the stomach. Excreted IV contrast within both renal collecting systems and urinary bladder. Bilateral ureteral stents in place. Nephrograms demonstrate mild hydronephrosis. Nonobstructive bowel gas pattern. IMPRESSION: 1. Tip and side port of the enteric tube below the diaphragm in the stomach. 2. Bilateral ureteral stents in place. Excreted IV contrast in both renal collecting systems in the urinary bladder with mild bilateral hydronephrosis. Electronically Signed   By: Keith Rake M.D.   On: 12/30/2019 01:13   DG Swallowing Func-Speech Pathology  Result Date: 01/03/2020 Objective Swallowing Evaluation: Type of Study: MBS-Modified Barium Swallow Study  Patient Details Name: Gloria [name] MRN: [id number] Date of Birth: Aug 07, 1945 Today's Date: 01/03/2020 Time: SLP Start Time (ACUTE ONLY): 1106 -SLP Stop Time (ACUTE ONLY): 4166 SLP Time Calculation  (min) (ACUTE ONLY): 26 min Past Medical History: Past Medical History: Diagnosis Date . Arthritis   knees . Atrial fibrillation ([place])  . Dysrhythmia  . History of kidney stones  . HOH (hard of hearing)  . Hypertension  Past Surgical History: Past Surgical History: Procedure Laterality Date . CHOLECYSTECTOMY   . CYSTOSCOPY WITH STENT PLACEMENT Bilateral 11/09/2019  Procedure: CYSTOSCOPY BILATERAL URETERAL STENT PLACEMENT;  Surgeon: Ardis Hughs, MD;  Location: [facility];  Service: Urology;  Laterality: Bilateral; . CYSTOSCOPY/URETEROSCOPY/HOLMIUM LASER/STENT PLACEMENT Right 11/22/2019  Procedure: CYSTOSCOPY RIGHT URETEROSCOPY/HOLMIUM LASER STONE EXTRACTION /STENT EXCHANGE;  Surgeon: Ardis Hughs, MD;  Location: [facility];  Service: Urology;  Laterality: Right; . CYSTOSCOPY/URETEROSCOPY/HOLMIUM LASER/STENT PLACEMENT Left 12/06/2019  Procedure: CYSTOSCOPY BILATERAL URETEROSCOPY/HOLMIUM LASER/STENT EXCHANGE;  Surgeon: Ardis Hughs, MD;  Location: [facility];  Service: Urology;  Laterality: Left; . CYSTOSCOPY/URETEROSCOPY/HOLMIUM LASER/STENT PLACEMENT Bilateral 12/25/2019  Procedure: BILATERAL URETEROSCOPY/HOLMIUM LASER LEFT, BILATERAL STONE EXTRACTION BILATERAL STENT EXCHANGE;  Surgeon: Ardis Hughs, MD;  Location: [facility];  Service: Urology;  Laterality: Bilateral; . IR CT HEAD LTD  12/30/2019 . IR INTRA CRAN STENT  12/30/2019 . IR PERCUTANEOUS ART THROMBECTOMY/INFUSION INTRACRANIAL INC DIAG ANGIO  12/30/2019 . RADIOLOGY WITH ANESTHESIA N/A 12/29/2019  Procedure: IR WITH ANESTHESIA;  Surgeon: Radiologist, Medication, MD;  Location: [place];  Service: Radiology;  Laterality: N/A; HPI: 74 y.o. female admitted with flaccid Lt side and Rt gaze preference.  Pt received tPA.  CTA revealed Rt M1 occlusion and she was taken for emergent thrombectomy.  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  She was extubated 12/31/2019. PMH:  Pt is very HOH - reads lips, HTN, h/o kidney stones, A-Fib; s/p multiple cystocopies with stent placement.   Subjective: alert Assessment / Plan / Recommendation CHL IP CLINICAL IMPRESSIONS 01/03/2020 Clinical Impression Pt presents with a moderate oral and more mild pharyngeal dysphagia. She has decreased bolus cohesion and oral clearance across consistencies. There is anterior loss of solids, unmasticated and without evidence of awareness. Labial seal with liquids is best via straw. Thin liquids spill prematurely into the pharynx and sit briefly in the larynx and pyriform sinuses before a swallow is triggered. Aspiration occurs intermittently and silently, and cues to cough are difficult given her hearing loss despite use of clear masks and glasses for lipreading and visual cues. She initially only has penetration while using  a chin tuck, but since it cannot be cleared to command, this ultimately results in aspiration as well. She has improved oral control and containment at the valleculae with nectar thick liquids and purees. Otherwise, motorically, her pharyngeal phase is functional. Recommend starting Dys 1 (pureed) solids and nectar thick liquids via straw.  SLP Visit Diagnosis Dysphagia, oropharyngeal phase (R13.12) Attention and concentration deficit following -- Frontal lobe and executive function deficit following -- Impact on safety and function Mild aspiration risk;Moderate aspiration risk   CHL IP TREATMENT RECOMMENDATION 01/03/2020 Treatment Recommendations Therapy as outlined in treatment plan below   Prognosis 01/03/2020 Prognosis for Safe Diet Advancement Good Barriers to Reach Goals Cognitive deficits Barriers/Prognosis Comment -- CHL IP DIET RECOMMENDATION 01/03/2020 SLP Diet Recommendations Dysphagia 1 (Puree) solids;Nectar thick liquid Liquid Administration via Straw Medication Administration Crushed with puree Compensations Minimize environmental  distractions;Slow rate;Small sips/bites Postural Changes Seated upright at 90 degrees;Remain semi-upright after after feeds/meals (Comment)   CHL IP OTHER RECOMMENDATIONS 01/03/2020 Recommended Consults -- Oral Care Recommendations Oral care BID Other Recommendations Order thickener from pharmacy;Prohibited food (jello, ice cream, thin soups);Remove water pitcher;Have oral suction available   CHL IP FOLLOW UP RECOMMENDATIONS 01/03/2020 Follow up Recommendations Inpatient Rehab   CHL IP FREQUENCY AND DURATION 01/03/2020 Speech Therapy Frequency (ACUTE ONLY) min 2x/week Treatment Duration 2 weeks      CHL IP ORAL PHASE 01/03/2020 Oral Phase Impaired Oral - Pudding Teaspoon -- Oral - Pudding Cup -- Oral - Honey Teaspoon -- Oral - Honey Cup -- Oral - Nectar Teaspoon -- Oral - Nectar Cup -- Oral - Nectar Straw Decreased bolus cohesion;Reduced posterior propulsion;Lingual/palatal residue Oral - Thin Teaspoon Decreased bolus cohesion;Reduced posterior propulsion;Premature spillage Oral - Thin Cup -- Oral - Thin Straw Decreased bolus cohesion;Reduced posterior propulsion;Lingual/palatal residue;Premature spillage Oral - Puree Decreased bolus cohesion;Reduced posterior propulsion;Lingual/palatal residue Oral - Mech Soft Decreased bolus cohesion;Reduced posterior propulsion;Lingual/palatal residue;Left anterior bolus loss Oral - Regular -- Oral - Multi-Consistency -- Oral - Pill -- Oral Phase - Comment --  CHL IP PHARYNGEAL PHASE 01/03/2020 Pharyngeal Phase Impaired Pharyngeal- Pudding Teaspoon -- Pharyngeal -- Pharyngeal- Pudding Cup -- Pharyngeal -- Pharyngeal- Honey Teaspoon -- Pharyngeal -- Pharyngeal- Honey Cup -- Pharyngeal -- Pharyngeal- Nectar Teaspoon -- Pharyngeal -- Pharyngeal- Nectar Cup -- Pharyngeal -- Pharyngeal- Nectar Straw Delayed swallow initiation-vallecula Pharyngeal -- Pharyngeal- Thin Teaspoon Delayed swallow initiation-vallecula Pharyngeal -- Pharyngeal- Thin Cup -- Pharyngeal -- Pharyngeal- Thin Straw Delayed  swallow initiation-pyriform sinuses;Penetration/Aspiration before swallow Pharyngeal Material enters airway, passes BELOW cords without attempt by patient to eject out (silent aspiration) Pharyngeal- Puree Delayed swallow initiation-vallecula Pharyngeal -- Pharyngeal- Mechanical Soft -- Pharyngeal -- Pharyngeal- Regular -- Pharyngeal -- Pharyngeal- Multi-consistency -- Pharyngeal -- Pharyngeal- Pill -- Pharyngeal -- Pharyngeal Comment --  CHL IP CERVICAL ESOPHAGEAL PHASE 01/03/2020 Cervical Esophageal Phase WFL Pudding Teaspoon -- Pudding Cup -- Honey Teaspoon -- Honey Cup -- Nectar Teaspoon -- Nectar Cup -- Nectar Straw -- Thin Teaspoon -- Thin Cup -- Thin Straw -- Puree -- Mechanical Soft -- Regular -- Multi-consistency -- Pill -- Cervical Esophageal Comment -- Osie Bond., M.A. [facility] Pager [phone] Office [phone] 01/03/2020, 12:39 PM              DG C-Arm 1-60 Min-No Report  Result Date: 12/25/2019 Fluoroscopy was utilized by the requesting physician.  No radiographic interpretation.   DG C-Arm 1-60 Min-No Report  Result Date: 12/06/2019 Fluoroscopy was utilized by the requesting physician.  No radiographic interpretation.   VAS [place] GROIN PSEUDOANEURYSM  Result Date: 01/01/2020  ARTERIAL PSEUDOANEURYSM  Exam: Right groin Indications: Patient complains of bruising and drop in hemoglobin. History: S/p catheterization. Comparison Study: 12/30/19 negative pseudoaneurysm/ hematoma Performing Technologist: June Leap RDMS, RVT  Examination Guidelines: A complete evaluation includes B-mode imaging, spectral Doppler, color Doppler, and power Doppler as needed of all accessible portions of each vessel. Bilateral testing is considered an integral part of a complete examination. Limited examinations for reoccurring indications may be performed as noted. +------------+----------+---------+------+----------+ Right DuplexPSV (cm/s)Waveform PlaqueComment(s)  +------------+----------+---------+------+----------+ CFA            140    triphasic                 +------------+----------+---------+------+----------+ Right Vein comments:patent CFV  Findings: No hematoma visualized  Summary: No evidence of pseudoaneurysm, AVF or DVT  Diagnosing physician: Harold Barban MD Electronically signed by Harold Barban MD on 01/01/2020 at 8:52:29 PM.   --------------------------------------------------------------------------------    Final    VAS [place] GROIN PSEUDOANEURYSM  Result Date: 12/31/2019  ARTERIAL PSEUDOANEURYSM  Exam: Right groin Indications: Patient complains of bruising. Comparison Study: no prior Performing Technologist: Abram Sander RVS  Examination Guidelines: A complete evaluation includes B-mode imaging, spectral Doppler, color Doppler, and power Doppler as needed of all accessible portions of each vessel. Bilateral testing is considered an integral part of a complete examination. Limited examinations for reoccurring indications may be performed as noted. +------------+----------+--------+------+----------+ Right DuplexPSV (cm/s)WaveformPlaqueComment(s) +------------+----------+--------+------+----------+ CFA            147    biphasic                 +------------+----------+--------+------+----------+ Prox SFA       146    biphasic                 +------------+----------+--------+------+----------+  Summary: No evidence of pseudoaneurysm, AVF or DVT  Diagnosing physician: Deitra Mayo MD Electronically signed by Deitra Mayo MD on 12/31/2019 at 8:54:10 AM.   --------------------------------------------------------------------------------    Final    IR PERCUTANEOUS ART THROMBECTOMY/INFUSION INTRACRANIAL INC DIAG ANGIO  Result Date: 12/31/2019 INDICATION: New onset left-sided hemiplegia, right gaze deviation. Occluded right middle cerebral M1 segment on CT angiogram of the head and neck. EXAM: 1. EMERGENT LARGE VESSEL  OCCLUSION THROMBOLYSIS (anterior CIRCULATION) COMPARISON:  CT angiogram of the head and neck of June, 27, 2021. MEDICATIONS: Vancomycin 1 g IV was administered within 1 hour of the procedure. ANESTHESIA/SEDATION: General anesthesia CONTRAST:  Isovue 300 approximately 170 mL FLUOROSCOPY TIME:  Fluoroscopy Time: 131 minutes 12 seconds (4835 mGy). COMPLICATIONS: None immediate. TECHNIQUE: Following a full explanation of the procedure along with the potential associated complications, an informed witnessed consent was obtained the patient's son. The risks of intracranial hemorrhage of 10%, worsening neurological deficit, ventilator dependency, death and inability to revascularize were all reviewed in detail with the patient's son. The patient was then put under general anesthesia by the Department of Anesthesiology at [facility]. The right groin was prepped and draped in the usual sterile fashion. Thereafter using modified Seldinger technique, transfemoral access into the right common femoral artery was obtained without difficulty. Over a 0.035 inch guidewire an 8 French 25 cm Pinnacle sheath was inserted. Through this, and also over a 0.035 inch guidewire a combination of a select 5 French 125 cm Simmons 2 catheter inside of a 95 cm 087 balloon guide catheter was advanced without difficulty to the right common carotid artery. The guidewire and the select catheter were removed. Good aspiration was  obtained from the hub of the balloon guide catheter just proximal to the right common carotid artery bifurcation. Arteriogram was then performed centered extra cranially and intracranially. FINDINGS: The right common carotid arteriogram demonstrates the right external carotid artery and its major branches to be widely patent. The right internal carotid artery at the bulb demonstrates a smooth shallow plaque. Moderate tortuosity was noted of the proximal right internal carotid artery. 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 Distal to this the cervical petrous junction demonstrates normal opacification. Distal to this the petrous, cavernous, and the cavernous segments demonstrate patency as does the supraclinoid segment. Opacification is seen of the right posterior communicating artery opacifying the right posterior cerebral distribution. The right middle cerebral artery demonstrates complete angiographic occlusion at the origin of the anterior temporal branch. Focal areas of caliber irregularity are seen of the proximal right middle cerebral artery. PROCEDURE: Through the 087 balloon guide catheter in the proximal right internal carotid artery, a Zoom 071 135 cm catheter inside of which was an 021 160 cm Phenom microcatheter was advanced over a 0.014 inch standard Synchro micro guidewire gingerly through the right internal carotid artery in the neck and into the supraclinoid right ICA. The micro guidewire was then gently manipulated with a torque device and advanced into the inferior division M2 M3 region of the right middle cerebral artery followed by the microcatheter. The guidewire was removed. Good aspiration was obtained from the hub of the microcatheter. Gentle contrast injection demonstrated safe position of tip of the microcatheter. A 5 mm x 37 mm Embotrap retrieval device was then advanced to the distal end of the microcatheter. The O ring on the delivery micro guidewire was loosened. With slight forward gentle traction with the right hand on the delivery micro guidewire, with the left hand the retrieval device was deployed. The 071 Zoom catheter was then advanced into the right middle cerebral artery engaging the occluded right middle cerebral artery. With constant flow arrest in the right  internal carotid artery, and constant aspiration using a Penumbra device at the hub of the 071 Zoom catheter for approximately 3 minutes, the combination of the retrieval device, the microcatheter, and the Zoom aspiration catheter were retrieved and removed. Following reversal of flow arrest, a control arteriogram performed through the balloon guide catheter in the right internal carotid artery demonstrates revascularization of the right middle cerebral artery and also of the superior and inferior divisions. The anterior temporal branch remains patent. A TICI 2B revascularization was achieved. This also unmasked an irregular filling defect at the right middle cerebral artery terminus at the trifurcation region with flow noted in the distal inferior division. A second pass was then made again with the combination of the 071 Zoom catheter advanced over an 021 160 cm Phenom microcatheter over a 0.014 inch standard Synchro micro guidewire. Again access was obtained into the distal M2 M3 region of the inferior division with the micro guidewire then followed by the microcatheter. The guidewire was removed. Again safe position of the tip of the microcatheter was confirmed and connected to continuous heparinized saline infusion. A 4 mm x 40 mm Solitaire X retrieval device was then deployed in the manner described above. Again with proximal flow arrest in the right internal carotid artery proximally, and constant aspiration via the Zoom catheter  which was now imbedded in the occluded right middle cerebral artery at its trifurcation region over approximately 2-1/2 minutes, the combination of the retrieval device, the microcatheter, and the Zoom catheter was retrieved and removed. Following reversal of flow arrest, a control arteriogram performed through the right internal carotid artery demonstrated improved opacification of the right MCA trifurcation branches. There continued be occluded superior division. The combination of  the microcatheter, inside a 071 Zoom aspiration catheter was again advanced to the right middle cerebral artery over a 0.014 inch standard Synchro micro guidewire, which was now advanced into the superior division of the right middle cerebral artery emanating at the MCA trifurcation region. The microcatheter was advanced to the M2 region over a micro guidewire. The micro guidewire was removed. Good aspiration obtained from the hub of the microcatheter. Again with flow arrest in the right internal carotid artery proximally, and constant aspiration with the Penumbra aspiration device at the hub of the aspiration catheter following deployment of a 3 mm x 20 mm Solitaire X retrieval device, for 2 minutes, the combination of the retrieval device, the microcatheter and the Zoom catheter were retrieved and removed. The superior division continued to be occluded. The superior division was again cannulated with a microcatheter over a micro guidewire as described above. After having verified safe position of the tip of the microcatheter, in the superior division M2 region, a 4 mm x 40 mm Solitaire X retrieval device was deployed in the usual manner. Again with proximal flow arrest in the proximal right ICA, and constant aspiration at the hub of the Zoom catheter at the site of the occluded superior division in the distal right middle cerebral M1 segment over 2 minutes, the combination of the retrieval device, the microcatheter and the retrieval device were retrieved and removed. Following reverse of flow arrest, there appeared to be modest improved flow in the proximal superior division. It was now noted the inferior division had a near occlusive clot in the proximal aspect. A fourth pass was then made this time using a combination of the Phenom microcatheter, inside of a 6 [place] Catalyst 135 cm catheter advanced over a 0.014 inch standard Synchro micro guidewire to the distal right M1 segment. The micro guidewire was then  advanced into the M2 M3 region of the inferior division followed by the microcatheter. The guidewire was removed. A 4 mm x 40 mm Solitaire X retrieval device was deployed with the Catalyst guide catheter advanced at the origin of inferior division and distal to this. Following proximal flow arrest and constant aspiration for approximately 2 minutes at the hub of the Catalyst guide catheter, the combination of the retrieval device, the microcatheter and the Catalyst catheter were retrieved and removed following reversal of flow arrest. Improved flow was now noted into the inferior division proximally through the two main branch points. Following each thrombectomy, strips of fragments were seen intertwined either in the retrieval device, or in the aspiration catheters. 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 It was therefore decided to proceed with placement of a rescue stent in order to prevent occlusion of the major right MCA inferior division branch. The microcatheter was again advanced over a 0.014 inch standard Synchro micro guidewire with a 6 [place] Catalyst guide catheter. The microcatheter was advanced over a micro guidewire without difficulty into the M2 region followed by the removal of the micro guidewire. Again good aspiration obtained from the hub of the  microcatheter. This was then connected to continuous heparinized saline infusion. Measurements performed of the right middle cerebral artery in the mid M1 segment, and also the proximal inferior division distal to the severe stenosis. A 4 mm x 24 mm Neuroform Atlas stent was advanced to the distal end of the microcatheter. The O ring on the delivery microcatheter was then loosened. With slight forward gentle traction with the right hand on the delivery micro guidewire with left hand the delivery microcatheter was gently retrieved unsheathing the distal and then the proximal  portion of the stent with excellent coverage at the site of the severe stenosis. The delivery apparatus was removed. A control arteriogram performed through the balloon guide in the distal cervical ICA on the right demonstrated now significantly improved caliber and flow through the right middle cerebral artery the dominant inferior division. There was poor stagnant flow noted in the proximal superior division. Free flow through the stented segment and also the other branch of the right middle cerebral artery except for the superior division. 8 mg of Integrilin was given intra-arterially in order to prevent intra stent platelet aggregation. A 10 minute post stent arteriogram continued to demonstrate flow through the stented segment in the right MCA distribution except for the non dominant superior division. A TICI 2b revascularization was maintained. The right posterior communicating artery with the right posterior cerebral artery distribution remained unchanged. Throughout the procedure, the patient's blood pressure and neurological status remained stable. A CT of the brain performed following the third pass demonstrated no mass-effect or midline shift with mild element of contrast in the right perisylvian region. The balloon guide was then retrieved and removed. The 8 French 25 cm Pinnacle sheath was then exchanged over a 0.035 inch J-tip guidewire for a short 8 [place] Pinnacle sheath. An arteriogram performed through this demonstrated spasm in the previously noted tortuous right common iliac artery. There was small amount of contrast noted in the soft tissue at the site of entry of the 8 [place] Pinnacle sheath which completely disappeared on repeat arteriogram performed approximately 3 minutes later. The 8 French sheath was removed and a 7 [place] ExoSeal closure device was placed. Also minimal compression was held for approximately 25 minutes. The distal pulses remained Dopplerable in the dorsalis pedis, and the  posterior tibial regions bilaterally. Patient was left intubated on account of the patient's inability to communicate due to difficulty hearing and also her neurological condition. During this time, while pressure was held, and firmness was felt in the region of the pannus on the right side in the right lower quadrant, and also in the right inguinal region. In view of the unstable blood pressure, the patient was sent to CT scan for CT of the pelvis and also of the abdomen and also CT of the brain. CT of the brain revealed no evidence of mass effect or midline shift with no significant change in the right perisylvian hyperattenuation felt to be due to a contrast stain. CT of the abdomen and pelvis revealed no evidence of the overlying skin tightness. Hemodynamically the patient was now stable with blood pressure in the 150s over 80s and the heart rate being regular in the 80s to 90s sinus rhythm. Patient was then transferred to the neuro ICU intubated for post thrombectomy management. IMPRESSION: Status post endovascular revascularization of occluded right middle cerebral M1 segment with 4 passes with different stent retrievers and Penumbra aspiration achieving a TICI 2B revascularization. Status post rescue stent placement from the distal  M1 segment into the proximal dominant inferior division of the right middle cerebral artery due to underlying severe intracranial arteriosclerosis. PLAN: Follow-up in the clinic 4 weeks post discharge. Electronically Signed   By: Luanne Bras M.D.   On: 12/30/2019 17:15   DG HIP PORT UNILAT WITH PELVIS 1V LEFT  Result Date: 01/02/2020 CLINICAL DATA:  Left hip pain. EXAM: DG HIP (WITH OR WITHOUT PELVIS) 1V PORT LEFT COMPARISON:  None. FINDINGS: There is no evidence of hip fracture or dislocation. There is no evidence of arthropathy or other focal bone abnormality. IMPRESSION: Negative. Electronically Signed   By: Marijo Conception M.D.   On: 01/02/2020 15:00   CT HEAD CODE  STROKE WO CONTRAST  Result Date: 12/29/2019 CLINICAL DATA:  Code stroke.  Left-sided weakness EXAM: CT HEAD WITHOUT CONTRAST TECHNIQUE: Contiguous axial images were obtained from the base of the skull through the vertex without intravenous contrast. COMPARISON:  None. FINDINGS: Brain: There is no acute intracranial hemorrhage or mass effect. Gray-white differentiation is preserved. Ventricles and sulci are within normal limits in size and configuration. Patchy hypoattenuation in the supratentorial white matter is nonspecific but probably reflects chronic microvascular ischemic changes. Extra-axial dural-based hyperdense lesion along the high left frontal convexity and falx measuring 2.6 x 1.8 x 2.3 cm is consistent with a meningioma. There is no apparent underlying edema. This abuts the superior sagittal sinus. Vascular: No hyperdense vessel. Mild intracranial atherosclerotic calcification at the skull base. Skull: Unremarkable. Sinuses/Orbits: No acute abnormality. Other: Mastoid air cells are clear. ASPECTS ([place] Stroke Program Early CT Score) - Ganglionic level infarction (caudate, lentiform nuclei, internal capsule, insula, M1-M3 cortex): 7 - Supraganglionic infarction (M4-M6 cortex): 3 Total score (0-10 with 10 being normal): 10 IMPRESSION: No acute intracranial hemorrhage or evidence of acute infarction. ASPECT score is 10. Chronic microvascular ischemic changes. Left frontal meningioma without significant mass effect or underlying edema. Initial results were communicated to Dr. Lorraine Lax at [place] 6/27/2021by text page via the [facility] messaging system. Electronically Signed   By: Macy Mis M.D.   On: 12/29/2019 18:20   VAS [place] LOWER EXTREMITY VENOUS (DVT)  Result Date: 01/02/2020  Lower Venous DVTStudy Indications: Swelling.  Risk Factors: None identified. Limitations: Body habitus, poor ultrasound/tissue interface and patient positioning, patient pain tolerance. Comparison Study: No prior studies.  Performing Technologist: Oliver Hum RVT  Examination Guidelines: A complete evaluation includes B-mode imaging, spectral Doppler, color Doppler, and power Doppler as needed of all accessible portions of each vessel. Bilateral testing is considered an integral part of a complete examination. Limited examinations for reoccurring indications may be performed as noted. The reflux portion of the exam is performed with the patient in reverse Trendelenburg.  +-----+---------------+---------+-----------+----------+--------------+ RIGHTCompressibilityPhasicitySpontaneityPropertiesThrombus Aging +-----+---------------+---------+-----------+----------+--------------+ CFV  Full           Yes      Yes                                 +-----+---------------+---------+-----------+----------+--------------+   +---------+---------------+---------+-----------+----------+--------------+ LEFT     CompressibilityPhasicitySpontaneityPropertiesThrombus Aging +---------+---------------+---------+-----------+----------+--------------+ CFV      Full           Yes      Yes                                 +---------+---------------+---------+-----------+----------+--------------+ SFJ      Full                                                        +---------+---------------+---------+-----------+----------+--------------+  FV Prox  Full                                                        +---------+---------------+---------+-----------+----------+--------------+ FV Mid   Full                                                        +---------+---------------+---------+-----------+----------+--------------+ FV Distal               Yes      Yes                                 +---------+---------------+---------+-----------+----------+--------------+ PFV      Full                                                         +---------+---------------+---------+-----------+----------+--------------+ POP      Full           Yes      Yes                                 +---------+---------------+---------+-----------+----------+--------------+ PTV                                                   Not visualized +---------+---------------+---------+-----------+----------+--------------+ PERO                                                  Not visualized +---------+---------------+---------+-----------+----------+--------------+     Summary: RIGHT: - No evidence of common femoral vein obstruction.  LEFT: - There is no evidence of deep vein thrombosis in the lower extremity. However, portions of this examination were limited- see technologist comments above.  - No cystic structure found in the popliteal fossa.  *See table(s) above for measurements and observations. Electronically signed by Servando Snare MD on 01/02/2020 at 4:31:17 PM.    Final    ECHOCARDIOGRAM LIMITED  Result Date: 12/30/2019    ECHOCARDIOGRAM LIMITED REPORT   Patient Name:   ARZU MCGAUGHEY Date of Exam: 12/30/2019 Medical Rec #:  [id number]           Height:       62.0 in Accession #:    [id number]          Weight:       211.0 lb Date of Birth:  [birth year]            BSA:          1.956 m Patient Age:    33 years  BP:           132/80 mmHg Patient Gender: F                   HR:           64 bpm. Exam Location:  Inpatient Procedure: Limited Color Doppler, Cardiac Doppler and Limited Echo Indications:    stroke 434.91  History:        Patient has prior history of Echocardiogram examinations, most                 recent 11/09/2019. Arrythmias:Atrial Fibrillation.  Sonographer:    Johny Chess Referring Phys: [id number] [place]  1. Left ventricular ejection fraction, by estimation, is >75%. The left ventricle has hyperdynamic function. The left ventricle has no regional wall motion abnormalities. There is severe left  ventricular hypertrophy. Left ventricular diastolic parameters are indeterminate.  2. Right ventricular systolic function is normal. The right ventricular size is normal. There is mildly elevated pulmonary artery systolic pressure.  3. Tricuspid valve regurgitation is mild to moderate.  4. The aortic valve is abnormal. Aortic valve regurgitation is mild to moderate. Mild aortic valve sclerosis is present, with no evidence of aortic valve stenosis.  5. The inferior vena cava is normal in size with greater than 50% respiratory variability, suggesting right atrial pressure of 3 mmHg. FINDINGS  Left Ventricle: Left ventricular ejection fraction, by estimation, is >75%. The left ventricle has hyperdynamic function. The left ventricle has no regional wall motion abnormalities. The left ventricular internal cavity size was small. There is severe left ventricular hypertrophy. Right Ventricle: The right ventricular size is normal. No increase in right ventricular wall thickness. Right ventricular systolic function is normal. There is mildly elevated pulmonary artery systolic pressure. The tricuspid regurgitant velocity is 3.14  m/s, and with an assumed right atrial pressure of 3 mmHg, the estimated right ventricular systolic pressure is 95.1 mmHg. Pericardium: Trivial pericardial effusion is present. Mitral Valve: The mitral valve is abnormal. There is mild thickening of the mitral valve leaflet(s). Moderate mitral annular calcification. Tricuspid Valve: The tricuspid valve is grossly normal. Tricuspid valve regurgitation is mild to moderate. Aortic Valve: The aortic valve is abnormal. Aortic valve regurgitation is mild to moderate. Mild aortic valve sclerosis is present, with no evidence of aortic valve stenosis. Pulmonic Valve: The pulmonic valve was normal in structure. Pulmonic valve regurgitation is not visualized. Aorta: The aortic root and ascending aorta are structurally normal, with no evidence of dilitation.  Venous: The inferior vena cava is normal in size with greater than 50% respiratory variability, suggesting right atrial pressure of 3 mmHg.  LEFT VENTRICLE PLAX 2D LVIDd:         3.10 cm LVIDs:         1.60 cm LV PW:         1.70 cm LV IVS:        1.60 cm LVOT diam:     1.90 cm LVOT Area:     2.84 cm  IVC IVC diam: 1.90 cm LEFT ATRIUM         Index LA diam:    4.70 cm 2.40 cm/m   AORTA Ao Root diam: 3.50 cm Ao Asc diam:  3.50 cm TRICUSPID VALVE TR Peak grad:   39.4 mmHg TR Vmax:        314.00 cm/s  SHUNTS Systemic Diam: 1.90 cm Dorris Carnes MD Electronically signed by Dorris Carnes MD Signature Date/Time: 12/30/2019/7:38:12 PM  Final     PHYSICAL EXAM   Temp:  [97.9 F (36.6 C)-99.5 F (37.5 C)] 99.5 F (37.5 C) (07/03 1214) Pulse Rate:  [59-77] 74 (07/03 1214) Resp:  [12-36] 20 (07/03 1301) BP: (122-158)/(46-70) 158/69 (07/03 1214) SpO2:  [97 %-100 %] 100 % (07/03 1214) Weight:  [99.2 kg] 99.2 kg (07/03 0422)  Awakens to voice command. Right gaze deviation. Left facial droop. Reduced movement in the LUE and LLE. Will not follow commands. Will not speak fluently, except when provoked with pain. Will not name or repeat.   RN has noticed spontaneous movements in all extremities, R>>L Left Babinski. Abdominal distention prominent with some grimacing.  ASSESSMENT/PLAN Gloria [name] is a 74 y.o. female with history of bilateral ureteral calculi, HTN, Atrial Fibrillation not on anticoagulation (not on [facility] due to high risk of falls), who presents with left sided hemiplegia and right gaze preference. CTH in the ED revealed left frontal meningioma, CTA with a right M1 occlusion. She received IVTPA on 12/29/19 at 1830 w/LKW 1645 and was taken for mechanical thrombectomy with resulting TICI 2B revascularization w/placement of recue stent in the right MCA.  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 Small vessel disease. Atrophy. ASPECTS 10.     CTA head & neck R M1 occlusion, fetal PCA on the right  Cerebral angio R M1 occlusion w/p TICI 2b revascularization. 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    Post IR CT hyperdensity R subarachnoid space, stable L parafalcine meningioma  MRI  R occipital infarct. R insular and frontal operculum infarct. Small scattered R frontal and parietal infarcts.  MRA  fetal R PCA. Missing superior division R MCA. R MCA stent, inferior division with flow.  LE Doppler  No DVT  2D Echo EF > 75%, severe LVH. Mild to moderate TVR.  LDL 90  HgbA1c 5.2  Heparin subq for VTE prophylaxis  aspirin 325 mg daily prior to admission, now on aspirin 81 mg daily and Brilinta (ticagrelor) 90 mg bid given stent placement. Plan to continue Brilinta at d/c with [facility] in 5-7 days (around 01/09/20 if anemia stable).   Therapy recommendations:  CIR  Disposition:  pending   Acute Respiratory Failure  intubated for IR, left intubated for airway protection  Extubation 6/29  Tolerated well  Hematoma R groin  pseudoanerysm w/ extravasation following IR  CT abd & pelvis R inguinal hematoma into abdominal wall. Stable B ureteral stents and B adrenal adenoma. Aortic atherosclerosis.    Hgb 9.2->8.8->8.4->7.8->7.3 - 1u PRBC - 8.7->8.6->9.3  [place] right groin x 2 - no pseudoaneurysm   Hematuria, resolved  S/p B ureteral stents exchange 6/4 d/t B renal calculi  ABD CT Mild B hydronephrosis, stents in place  Hgb 9.2->8.8->8.4->7.8->7.3 - 1u PRBC - 8.7->8.6->9.3  Cre 1.9->1.5->1.44->1.88->1.64->1.49->1.32  Urology consulted, appreciate help  Ureteral stents removed by urology 6/30 followed with 1 dose cipro  ongoing Cre monitor, if elevated, consider [place] abd to rule out hydro  No gross hematuria now  Atrial Fibrillation  Home anticoagulation:  none d/t high fall risk  Not  timing for [facility] at this time  Plan with [facility] in 5-7 days (around 01/09/20) if anemia stable   Left leg swelling  LE venous doppler no DVT  XR  of left knee severe OA and hip neg  On heparin subq for DVT prophylaxis   Continue SCDs  Hypertension  Home meds:  Coreg 6.25 bid  Stable 110-140  SBP goal now < 180  Long-term BP goal normotensive  Hyperlipidemia  Home meds:  No statin  LDL 90, goal < 70  lipitor 40 now  Continue statin at discharge  Dysphagia  Secondary to stroke  NPO -> pass swallow -> dys1 with nectar thick  Speech on board  Cortrak placed  On TF @ 50 -> 25 to facilitate oral intake  Continue IVF @ 25   CKD stage IIIab  Cre 1.49->1.76->1.50->1.88->1.64->1.49->1.32  Could be due to contrast nephrotoxicity  On IVF and TF  Encourage oral intake  Continue monitoring BMP  Other Stroke Risk Factors  Advanced age  Obesity, Body mass index is 38.59 kg/m., recommend weight loss, diet and exercise as appropriate   Other Active Problems  Urinary incontinence on mirabegron PTA  [facility] day # 6  Right MCA, left MCA, and right PCA acute infarcts secondary to a. Fib not on anticoagulation.    She is s/p right MCA stent and is on DAPT for stent. Although some continue this for 6-12 months as in coronary stents, I agree with Dr. Erlinda Hong that a change to anticoagulation for secondary stroke prophylaxis from a. Fib is warranted in the near future.   Abdominal distension - ?constipation vs ileus or obstruction - abdominal X-ray.  Rogue Jury, MS, MD      To contact Stroke Continuity provider, please refer to [email or web address] After hours, contact General Neurology

## 2020-01-05 LAB — GLUCOSE, CAPILLARY
Glucose-Capillary: 127 mg/dL — ABNORMAL HIGH (ref 70–99)
Glucose-Capillary: 133 mg/dL — ABNORMAL HIGH (ref 70–99)
Glucose-Capillary: 124 mg/dL — ABNORMAL HIGH (ref 70–99)
Glucose-Capillary: 115 mg/dL — ABNORMAL HIGH (ref 70–99)
Glucose-Capillary: 122 mg/dL — ABNORMAL HIGH (ref 70–99)

## 2020-01-05 MED ORDER — PRO-STAT SUGAR FREE PO LIQD
30.0000 mL | Freq: Two times a day (BID) | ORAL | Status: DC
  Administered 2020-01-05 – 2020-01-06 (×2): 30 mL
  Filled 2020-01-05: qty 30

## 2020-01-05 NOTE — Progress Notes (Signed)
STROKE TEAM PROGRESS NOTE   SUBJECTIVE (INTERVAL HISTORY) Abdominal X-ray showed dilated bowel loops in the left lower abdomen; ileus vs SBO.  Bowel movement recently.    OBJECTIVE Temp:  [97.5 F (36.4 C)-99.6 F (37.6 C)] 98.5 F (36.9 C) (07/04 1201) Pulse Rate:  [57-81] 62 (07/04 1201) Cardiac Rhythm: Atrial fibrillation (07/04 0813) Resp:  [18-24] 22 (07/04 1201) BP: (133-152)/(69-78) 136/78 (07/04 1201) SpO2:  [98 %-100 %] 100 % (07/04 1201) Weight:  [96.5 kg] 96.5 kg (07/04 0315)  Recent Labs  Lab 01/04/20 2019 01/04/20 2331 01/05/20 0351 01/05/20 0706 01/05/20 1114  GLUCAP 119* 123* 127* 133* 124*   Recent Labs  Lab 12/30/19 0500 12/30/19 0500 12/31/19 0342 12/31/19 0342 12/31/19 1211 12/31/19 1655 01/01/20 0615 01/02/20 0535 01/03/20 0442  NA 141  --  141  --   --   --  142 141 140  K 4.5  --  4.7  --   --   --  4.3 4.2 4.4  CL 113*  --  112*  --   --   --  112* 108 108  CO2 22  --  19*  --   --   --  22 25 23   GLUCOSE 149*  --  103*  --   --   --  125* 119* 126*  BUN 28*  --  33*  --   --   --  32* 31* 31*  CREATININE 1.44*  --  1.88*  --   --   --  1.64* 1.49* 1.32*  CALCIUM 7.9*   < > 8.0*   < >  --   --  8.1* 8.1* 8.4*  MG  --   --  1.9  --  1.8 1.8 1.8  --   --   PHOS  --   --   --   --  4.0 3.5 3.3  --   --    < > = values in this interval not displayed.   Recent Labs  Lab 12/29/19 1802  AST 17  ALT 11  ALKPHOS 82  BILITOT 0.6  PROT 6.2*  ALBUMIN 3.2*   Recent Labs  Lab 12/29/19 1802 12/29/19 1846 12/30/19 0500 12/30/19 0500 12/31/19 0342 01/01/20 0615 01/01/20 1446 01/02/20 0535 01/03/20 0442  WBC 5.8   < > 7.8  --  10.4 6.0  --  5.1 5.2  NEUTROABS 3.9  --  7.3  --   --   --   --   --   --   HGB 11.8*   < > 8.4*   < > 7.8* 7.3* 8.7* 8.6* 9.3*  HCT 38.3   < > 26.6*   < > 24.7* 23.5* 27.3* 27.2* 29.4*  MCV 104.4*   < > 103.1*  --  103.8* 104.9*  --  103.0* 103.2*  PLT 225   < > 200  --  223 157  --  175 212   < > = values in  this interval not displayed.   No results for input(s): CKTOTAL, CKMB, CKMBINDEX, TROPONINI in the last 168 hours. No results for input(s): LABPROT, INR in the last 72 hours. No results for input(s): COLORURINE, LABSPEC, [place], GLUCOSEU, HGBUR, BILIRUBINUR, KETONESUR, PROTEINUR, UROBILINOGEN, NITRITE, LEUKOCYTESUR in the last 72 hours.  Invalid input(s): APPERANCEUR     Component Value Date/Time   CHOL 143 12/30/2019 0500   TRIG 110 01/01/2020 0615   HDL 41 12/30/2019 0500   CHOLHDL 3.5 12/30/2019 0500  VLDL 12 12/30/2019 0500   LDLCALC 90 12/30/2019 0500   Lab Results  Component Value Date   HGBA1C 5.2 12/30/2019   No results found for: LABOPIA, COCAINSCRNUR, LABBENZ, AMPHETMU, THCU, LABBARB  No results for input(s): ETH in the last 168 hours.   IMAGING  CT ABDOMEN PELVIS WO CONTRAST 12/29/2019 IMPRESSION:  1. Hematoma within the right inguinal region extending along the inguinal crease into the pannus of the right lower quadrant abdominal wall. No extension into the retroperitoneal space.  2. Bilateral ureteral stents as above.  3. Stable bilateral adrenal adenoma.  4. Aortic Atherosclerosis (ICD10-I70.0).   CT Code Stroke CTA Head W/WO contrast 12/29/2019 IMPRESSION:  Occlusion of the right M1 MCA. However, there is good filling of the more distal MCA territory. No hemodynamically significant stenosis in the neck. These results were communicated to Dr. Lorraine Lax at [place] 6/27/2021by text page via the [facility] messaging system.   CT HEAD WO CONTRAST 12/29/2019 IMPRESSION:  1. Hyperdense material in the right subarachnoid space, overlying the frontal operculum and within the Sylvian fissure. This is most likely contrast staining, but follow-up studies will be necessary to differentiate from subarachnoid hemorrhage.  2. Unchanged left parafalcine meningioma.   CT Code Stroke CTA Neck W/WO contrast 12/29/2019 IMPRESSION:  Occlusion of the right M1 MCA. However, there is  good filling of the more distal MCA territory. No hemodynamically significant stenosis in the neck.    MR BRAIN WO CONTRAST MR ANGIO HEAD WO CONTRAST 12/30/2019 IMPRESSION:  Acute infarction of the right occipital cortex. Fetal origin right PCA with poor flow in the distal branch vessels. Acute infarction of the right insular region and frontal operculum. Missing superior division right MCA. Small scattered other acute infarctions in the right frontal and parietal regions. Swelling but no mass effect. No intraparenchymal hematoma. Few small foci of petechial blood versus vascular susceptibility signal. Right MCA stent. Missing superior division as noted above. Inferior division shows flow.   IR Intra Cran Stent 12/31/2019 IMPRESSION: Status post endovascular revascularization of occluded right middle cerebral M1 segment with 4 passes with different stent retrievers and [place] aspiration achieving a TICI 2B revascularization. Status post rescue stent placement from the distal M1 segment into the proximal dominant inferior division of the right middle cerebral artery due to underlying severe intracranial arteriosclerosis.  PLAN: Follow-up in the clinic 4 weeks post discharge.   DG CHEST PORT 1 VIEW 12/31/2019 IMPRESSION:  Status post extubation. No acute abnormality noted.   DG Chest Port 1 View 12/30/2019 IMPRESSION:  1. Low positioning of the endotracheal tube 6 mm from the carina. Retraction of 2-3 cm recommended.  2. Enteric tube in place with tip below the diaphragm.  3. Stable cardiomegaly and chronic interstitial coarsening.   DG Knee Left Port 01/02/2020 IMPRESSION:  Severe tricompartmental osteoarthritis. No acute abnormality seen in the left knee.   DG Abd Portable 1V 01/04/2020 IMPRESSION:  Dilated loop of small bowel in the left lower abdomen. This is nonspecific for ileus or small-bowel obstruction on this supine exam.   DG Abd Portable 1V 12/30/2019 IMPRESSION:  1. Tip and  side port of the enteric tube below the diaphragm in the stomach.  2. Bilateral ureteral stents in place. Excreted IV contrast in both renal collecting systems in the urinary bladder with mild bilateral hydronephrosis.    DG Swallowing Func-Speech Pathology 01/03/2020 Diet Recommendations Dysphagia 1 (Puree) solids;Nectar thick liquid Liquid Administration via Straw Medication Administration Crushed with puree Compensations Minimize environmental distractions;Slow rate;Small  sips/bites Postural Changes Seated upright at 90 degrees;Remain semi-upright after after feeds/meals (Comment)   CHL IP OTHER RECOMMENDATIONS 01/03/2020 Recommended Consults -- Oral Care Recommendations Oral care BID Other Recommendations Order thickener from pharmacy;Prohibited food (jello, ice cream, thin soups);Remove water pitcher;Have oral suction available   CHL IP FOLLOW UP RECOMMENDATIONS 01/03/2020 Follow up Recommendations Inpatient Rehab                  DG C-Arm 1-60 Min-No Report 12/25/2019 Fluoroscopy was utilized by the requesting physician.  No radiographic interpretation.   VAS [place] GROIN PSEUDOANEURYSM  01/01/2020 Findings:  No hematoma visualized   Summary:  No evidence of pseudoaneurysm, AVF or DVT      DG HIP PORT UNILAT WITH PELVIS 1V LEFT 01/02/2020 IMPRESSION:  Negative.   CT HEAD CODE STROKE WO CONTRAST 12/29/2019 IMPRESSION:  No acute intracranial hemorrhage or evidence of acute infarction. ASPECT score is 10. Chronic microvascular ischemic changes. Left frontal meningioma without significant mass effect or underlying edema.   VAS [place] LOWER EXTREMITY VENOUS (DVT) 01/02/2020 Summary:  RIGHT: - No evidence of common femoral vein obstruction.  LEFT: - There is no evidence of deep vein thrombosis in the lower extremity. However, portions of this examination were limited- see technologist comments above.  - No cystic structure found in the popliteal fossa.    ECHOCARDIOGRAM  LIMITED 12/30/2019 IMPRESSIONS   1. Left ventricular ejection fraction, by estimation, is >75%. The left ventricle has hyperdynamic function. The left ventricle has no regional wall motion abnormalities. There is severe left ventricular hypertrophy. Left ventricular diastolic parameters are indeterminate.   2. Right ventricular systolic function is normal. The right ventricular size is normal. There is mildly elevated pulmonary artery systolic pressure.   3. Tricuspid valve regurgitation is mild to moderate.   4. The aortic valve is abnormal. Aortic valve regurgitation is mild to moderate. Mild aortic valve sclerosis is present, with no evidence of aortic valve stenosis.   5. The inferior vena cava is normal in size with greater than 50% respiratory variability, suggesting right atrial pressure of 3 mmHg.    PHYSICAL EXAM   Temp:  [97.5 F (36.4 C)-99.6 F (37.6 C)] 98.5 F (36.9 C) (07/04 1201) Pulse Rate:  [57-81] 62 (07/04 1201) Resp:  [18-24] 22 (07/04 1201) BP: (133-152)/(69-78) 136/78 (07/04 1201) SpO2:  [98 %-100 %] 100 % (07/04 1201) Weight:  [96.5 kg] 96.5 kg (07/04 0315)  Awake; deaf. Oriented per RN when tested with hearing aids.. Right gaze deviation. Left facial droop. Withdraws to pain on right.   Reduced movement in the LUE and LLE to pain.   Will not follow commands. Dysarthria. Will not name or repeat.   Abdominal distention prominent.    ASSESSMENT/PLAN Ms. Gloria [name] is a 74 y.o. female with history of bilateral ureteral calculi, HTN, Atrial Fibrillation not on anticoagulation (not on [facility] due to high risk of falls), who presents with left sided hemiplegia and right gaze preference. CTH in the ED revealed left frontal meningioma, CTA with a right M1 occlusion. She received IVTPA on 12/29/19 at 1830 w/LKW 1645 and was taken for mechanical thrombectomy with resulting TICI 2B revascularization w/placement of recue stent in the right MCA.  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 Small vessel disease. Atrophy. ASPECTS 10.  CTA head & neck R M1 occlusion, fetal PCA on the right  Cerebral angio R M1 occlusion w/p TICI 2b revascularization. 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    Post IR CT hyperdensity R subarachnoid space, stable L parafalcine meningioma  MRI  R occipital infarct. R insular and frontal operculum infarct. Small scattered R frontal and parietal infarcts.  MRA  fetal R PCA. Missing superior division R MCA. R MCA stent, inferior division with flow.  LE Doppler  No DVT  2D Echo EF > 75%, severe LVH. Mild to moderate TVR.  LDL 90  HgbA1c 5.2  Heparin subq for VTE prophylaxis  aspirin 325 mg daily prior to admission, now on aspirin 81 mg daily and Brilinta (ticagrelor) 90 mg bid given stent placement. Plan to continue Brilinta at d/c with [facility] in 5-7 days (around 01/09/20 if anemia stable).   Therapy recommendations:  CIR  Disposition:  pending   Acute Respiratory Failure  intubated for IR, left intubated for airway protection  Extubation 6/29  Tolerated well  Hematoma R groin  pseudoanerysm w/ extravasation following IR  CT abd & pelvis R inguinal hematoma into abdominal wall. Stable B ureteral stents and B adrenal adenoma. Aortic atherosclerosis.    Hgb 9.2->8.8->8.4->7.8->7.3 - 1u PRBC - 8.7->8.6->9.3  [place] right groin x 2 - no pseudoaneurysm   Hematuria, resolved  S/p B ureteral stents exchange 6/4 d/t B renal calculi  ABD CT Mild B hydronephrosis, stents in place  Hgb 9.2->8.8->8.4->7.8->7.3 - 1u PRBC - 8.7->8.6->9.3  Cre 1.9->1.5->1.44->1.88->1.64->1.49->1.32  Urology consulted, appreciate help  Ureteral stents removed by urology 6/30 followed with 1 dose cipro  ongoing Cre monitor, if elevated, consider [place] abd to rule  out hydro  No gross hematuria now  Atrial Fibrillation  Home anticoagulation:  none d/t high fall risk  Not timing for [facility] at this time  Plan with [facility] in 5-7 days (around 01/09/20) if anemia stable   Left leg swelling  LE venous doppler no DVT  XR of left knee severe OA and hip neg  On heparin subq for DVT prophylaxis   Continue SCDs  Hypertension  Home meds:  Coreg 6.25 bid  Stable 110-140  SBP goal now < 180  Long-term BP goal normotensive  Hyperlipidemia  Home meds:  No statin  LDL 90, goal < 70  lipitor 40 now  Continue statin at discharge  Dysphagia  Secondary to stroke  NPO -> pass swallow -> dys1 with nectar thick  Speech on board  Cortrak placed  On TF @ 50 -> 25 to facilitate oral intake  Continue IVF @ 25   CKD stage IIIab  Cre 1.49->1.76->1.50->1.88->1.64->1.49->1.32  Could be due to contrast nephrotoxicity  On IVF and TF  Encourage oral intake  Continue monitoring BMP  Other Stroke Risk Factors  Advanced age  Obesity, Body mass index is 38.59 kg/m., recommend weight loss, diet and exercise as appropriate   Other Active Problems  Urinary incontinence on mirabegron PTA  HOH  Check labs in AM  Hospital day # 7  Assessment/Plan:  Right MCA, left MCA, and right PCA acute infarcts secondary to a. Fib not on anticoagulation.    She is s/p right MCA stent and is on DAPT for stent. Although some continue this for 6-12 months as in coronary stents, I do think that a change to anticoagulation for secondary stroke prophylaxis from a. Fib is warranted in the near future.  Abdominal distension - ?  ileus vs obstruction -  If not improved by early next week, then may consider GI consult.  Rogue Jury, MS, MD      To contact Stroke Continuity provider, please refer to [email or web address] After hours, contact General Neurology

## 2020-01-06 DIAGNOSIS — R1033 Periumbilical pain: Secondary | ICD-10-CM

## 2020-01-06 DIAGNOSIS — R140 Abdominal distension (gaseous): Secondary | ICD-10-CM

## 2020-01-06 LAB — GLUCOSE, CAPILLARY
Glucose-Capillary: 101 mg/dL — ABNORMAL HIGH (ref 70–99)
Glucose-Capillary: 101 mg/dL — ABNORMAL HIGH (ref 70–99)
Glucose-Capillary: 112 mg/dL — ABNORMAL HIGH (ref 70–99)
Glucose-Capillary: 112 mg/dL — ABNORMAL HIGH (ref 70–99)
Glucose-Capillary: 120 mg/dL — ABNORMAL HIGH (ref 70–99)
Glucose-Capillary: 122 mg/dL — ABNORMAL HIGH (ref 70–99)
Glucose-Capillary: 127 mg/dL — ABNORMAL HIGH (ref 70–99)

## 2020-01-06 LAB — COMPREHENSIVE METABOLIC PANEL
ALT: 26 U/L (ref 0–44)
AST: 48 U/L — ABNORMAL HIGH (ref 15–41)
Albumin: 2.6 g/dL — ABNORMAL LOW (ref 3.5–5.0)
Alkaline Phosphatase: 71 U/L (ref 38–126)
Anion gap: 11 (ref 5–15)
BUN: 40 mg/dL — ABNORMAL HIGH (ref 8–23)
CO2: 20 mmol/L — ABNORMAL LOW (ref 22–32)
Calcium: 8.3 mg/dL — ABNORMAL LOW (ref 8.9–10.3)
Chloride: 108 mmol/L (ref 98–111)
Creatinine, Ser: 1.49 mg/dL — ABNORMAL HIGH (ref 0.44–1.00)
GFR calc Af Amer: 40 mL/min — ABNORMAL LOW (ref >60)
GFR calc non Af Amer: 34 mL/min — ABNORMAL LOW (ref >60)
Glucose, Bld: 131 mg/dL — ABNORMAL HIGH (ref 70–99)
Potassium: 2.9 mmol/L — ABNORMAL LOW (ref 3.5–5.1)
Sodium: 139 mmol/L (ref 135–145)
Total Bilirubin: 0.9 mg/dL (ref 0.3–1.2)
Total Protein: 5.7 g/dL — ABNORMAL LOW (ref 6.5–8.1)

## 2020-01-06 LAB — MAGNESIUM: Magnesium: 2.1 mg/dL (ref 1.7–2.4)

## 2020-01-06 LAB — CBC
HCT: 32.7 % — ABNORMAL LOW (ref 36.0–46.0)
Hemoglobin: 10.4 g/dL — ABNORMAL LOW (ref 12.0–15.0)
MCH: 33.2 pg (ref 26.0–34.0)
MCHC: 31.8 g/dL (ref 30.0–36.0)
MCV: 104.5 fL — ABNORMAL HIGH (ref 80.0–100.0)
Platelets: 339 10*3/uL (ref 150–400)
RBC: 3.13 MIL/uL — ABNORMAL LOW (ref 3.87–5.11)
RDW: 15 % (ref 11.5–15.5)
WBC: 7.5 10*3/uL (ref 4.0–10.5)
nRBC: 0.3 % — ABNORMAL HIGH (ref 0.0–0.2)

## 2020-01-06 LAB — PHOSPHORUS: Phosphorus: 4.2 mg/dL (ref 2.5–4.6)

## 2020-01-06 MED ORDER — POTASSIUM CHLORIDE 20 MEQ PO PACK: 40.0000 meq | PACK | ORAL | Status: DC

## 2020-01-06 MED ORDER — POTASSIUM CHLORIDE 10 MEQ/100ML IV SOLN
10.0000 meq | INTRAVENOUS | Status: AC
  Administered 2020-01-06 (×3): 10 meq via INTRAVENOUS
  Filled 2020-01-06 (×3): qty 100

## 2020-01-06 MED ORDER — POTASSIUM CHLORIDE IN NACL 20-0.9 MEQ/L-% IV SOLN
INTRAVENOUS | Status: DC
  Filled 2020-01-06 (×2): qty 1000

## 2020-01-06 MED ORDER — BISACODYL 10 MG RE SUPP
10.0000 mg | Freq: Two times a day (BID) | RECTAL | Status: AC
  Administered 2020-01-06 – 2020-01-07 (×2): 10 mg via RECTAL
  Filled 2020-01-06 (×2): qty 1

## 2020-01-06 NOTE — Progress Notes (Signed)
Occupational Therapy Treatment Patient Details Name: Gloria [name] MRN: [id number] DOB: 28-Oct-1945 Today's Date: 01/06/2020    History of present illness This 74 y.o. female admitted with flaccid Lt side and Rt gaze preference.  Pt received tPA.  CTA revealed Rt M1 occlusion and she was taken for emergent thrombectomy.  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  She was extubated 12/31/2019. PMH:  Pt is very HOH - reads lips, HTN, h/o kidney stones, A-Fib; s/p multiple cystocopies with stent placement.  x-rays of L LE were negative for fx, confirmed L knee OA   OT comments  Pt making gradual progress towards OT goals this session. Pt continues to present with R gaze preference, L sided weakness, and hearing deficits impacting pts ability to complete BADLs.  Pt following commands <50% of session likely secondary to hearing deficits. Utilized clear face mask and wrote out commands with inconsistent results. Pt did appropriately gesture to distended stomach when asked if pt was experiencing pain. Pt did state no when asked if pt wanted to sit EOB. Session focus on bed mobility and repositioning as pt prefers to lean to R side. Pt currently requires total A +2 for all bed mobility. Agree with DC plan, will follow acutely per POC.    Follow Up Recommendations  CIR    Equipment Recommendations  None recommended by OT    Recommendations for Other Services      Precautions / Restrictions Precautions Precautions: Fall;Other (comment) Precaution Comments: Rt gaze preference with Lt neglect, essentially deaf Other Brace: does best with glasses donned and clear facemask so she can read your lips (masks are in room)       Mobility Bed Mobility Overal bed mobility: Needs Assistance Bed Mobility: Rolling Rolling: Total assist;+2 for physical  assistance         General bed mobility comments: pt did state "no" when asked to sit EOB. Pt sunken down in bed with heavy R cervical rotation. assisted pt with positioning in bed with pt needing total A +2 to roll to R. positioned pt on R side with head rotated L to decrease R cervial rotation  Transfers                 General transfer comment: defer this session    Balance                                           ADL either performed or assessed with clinical judgement   ADL Overall ADL's : Needs assistance/impaired                           Toilet Transfer Details (indicate cue type and reason): pts stomach very distended and does state note when OTA asked pt if we could sit up         Functional mobility during ADLs: Total assistance;+2 for physical assistance (to reposition in bed) General ADL Comments: wrote instructions and utilized clear face mask with pt only followings < 50% of time. total A for all bed mobility     Vision   Vision Assessment?: Vision impaired- to be further tested in functional context;Yes (assessed via observation d/t difficulty with pt following  commands) Eye Alignment: Impaired (comment) Ocular Range of Motion: Restricted on the left Alignment/Gaze Preference: Gaze right Tracking/Visual Pursuits: Impaired - to be further tested in functional context (difficult to determine d/t inconsistency with following commands) Saccades: Impaired - to be further tested in functional context Visual Fields: Impaired-to be further tested in functional context;Other (comment) (likely L visual field cut but difficulty determining) Additional Comments: heavy Rt gaze preference. repositioned pt with head at midline with pt able to track to midline but no further.   Perception     Praxis      Cognition Arousal/Alertness: Awake/alert Behavior During Therapy: WFL for tasks assessed/performed Overall Cognitive Status:  Difficult to assess Area of Impairment: Safety/judgement;Awareness;Attention                   Current Attention Level: Focused     Safety/Judgement: Decreased awareness of deficits;Decreased awareness of safety Awareness: Intellectual   General Comments: cognition difficult to determine d/t hearing deficits.pt following commands < 50% of time but when able to comprehend pt does gesture appropriately        Exercises Other Exercises Other Exercises: pt continues to present with flaccid LUE   Shoulder Instructions       General Comments pt left rolled to pts L side with head positioned in slight L cervical rotation. pt with great difficulty following commands overall. used clear mask and wrote down cues with inconsistent results.    Pertinent Vitals/ Pain       Pain Assessment: Faces (points to distended stomach) Faces Pain Scale: Hurts little more Pain Location: stomach Pain Descriptors / Indicators: Grimacing;Guarding Pain Intervention(s): Limited activity within patient's tolerance;Monitored during session;Repositioned  Home Living                                          Prior Functioning/Environment              Frequency  Min 2X/week        Progress Toward Goals  OT Goals(current goals can now be found in the care plan section)  Progress towards OT goals: Progressing toward goals  Acute Rehab OT Goals Patient Stated Goal: to go home OT Goal Formulation: Patient unable to participate in goal setting Time For Goal Achievement: 01/14/20 Potential to Achieve Goals: Good  Plan Discharge plan remains appropriate;Frequency remains appropriate    Co-evaluation                 AM-PAC OT "6 Clicks" Daily Activity     Outcome Measure   Help from another person eating meals?: Total Help from another person taking care of personal grooming?: Total Help from another person toileting, which includes using toliet, bedpan, or  urinal?: Total Help from another person bathing (including washing, rinsing, drying)?: Total Help from another person to put on and taking off regular upper body clothing?: Total Help from another person to put on and taking off regular lower body clothing?: Total 6 Click Score: 6    End of Session    OT Visit Diagnosis: Unsteadiness on feet (R26.81);Muscle weakness (generalized) (M62.81);Cognitive communication deficit (R41.841);Hemiplegia and hemiparesis Symptoms and signs involving cognitive functions: Cerebral infarction Hemiplegia - Right/Left: Left Hemiplegia - dominant/non-dominant: Non-Dominant Hemiplegia - caused by: Cerebral infarction   Activity Tolerance Patient limited by pain   Patient Left in bed;with call bell/phone within reach;with bed alarm set   Nurse Communication  Mobility status;Need for lift equipment        Time: 1211-1230 OT Time Calculation (min): 19 min  Charges: OT General Charges $OT Visit: 1 Visit OT Treatments $Therapeutic Activity: 8-22 mins  Lanier Clam., COTA/L Acute Rehabilitation Services [phone] [place] 01/06/2020, 3:14 PM

## 2020-01-06 NOTE — TOC Initial Note (Addendum)
Transition of Care [facility]) - Initial/Assessment Note    Patient Details  Name: Gloria [name] MRN: [id number] Date of Birth: 1945/08/10  Transition of Care [facility]) CM/SW Contact:    Benard Halsted, LCSW Phone Number: 01/06/2020, 2:23 PM  Clinical Narrative:                 CSW spoke with patient's brother regarding discharge plan if CIR is unable to accept patient due to lack of home support. Patient's brother confirmed that patient will likely never be able live independently again and is in agreement with SNF at discharge. CSW explained insurance process. He stated concern that insurance will only pay for short term. CSW advised him to go ahead and contact DSS to apply for facility Medicaid since it takes up to 90 days to process. CSW will send referral for review. Patient has not had COVID vaccines; brother is aware she will go into quarantine at [facility] for 14 days.     Expected Discharge Plan: Skilled Nursing Facility Barriers to Discharge: Insurance Authorization, SNF Pending bed offer, Continued Medical Work up   Patient Goals and CMS Choice Patient states their goals for this hospitalization and ongoing recovery are:: Rehab CMS Medicare.gov Compare Post Acute Care list provided to:: Patient Choice offered to / list presented to : Patient, Sibling  Expected Discharge Plan and Services Expected Discharge Plan: [place] In-house Referral: Clinical Social Work   Post Acute Care Choice: [place] Living arrangements for the past 2 months: [place]                                      Prior Living Arrangements/Services Living arrangements for the past 2 months: Single Family Home Lives with:: Self Patient language and need for interpreter reviewed:: Yes Do you feel safe going back to the place where you live?: Yes      Need for Family Participation in Patient Care: No (Comment) Care giver support system in place?: Yes (comment)    Criminal Activity/Legal Involvement Pertinent to Current Situation/Hospitalization: No - Comment as needed  Activities of Daily Living   ADL Screening (condition at time of admission) Is the patient deaf or have difficulty hearing?: Yes Does the patient have difficulty seeing, even when wearing glasses/contacts?: No Does the patient have difficulty concentrating, remembering, or making decisions?: No Patient able to express need for assistance with ADLs?: Yes Does the patient have difficulty dressing or bathing?: Yes Independently performs ADLs?: No Weakness of Legs: Both Weakness of Arms/Hands: Both  Permission Sought/Granted Permission sought to share information with : Facility Sport and exercise psychologist, Family Supports Permission granted to share information with : Yes, Verbal Permission Granted  Share Information with NAME: [name]  Permission granted to share info w AGENCY: SNFs  Permission granted to share info w Relationship: Brother  Permission granted to share info w Contact Information: [phone]  Emotional Assessment Appearance:: Appears stated age Attitude/Demeanor/Rapport: Unable to Assess Affect (typically observed): Unable to Assess Orientation: : Oriented to Self Alcohol / Substance Use: Not Applicable Psych Involvement: No (comment)  Admission diagnosis:  Stroke [facility]) [I63.9] Stroke (cerebrum) ([place]) [I63.9] Middle cerebral artery embolism, right [I66.01] Patient Active Problem List   Diagnosis Date Noted  . Atrial fibrillation ([place]) 01/01/2020  . Groin hematoma, R s/p IR for stroke 01/01/2020  . Hematuria 01/01/2020  . Accelerated hypertension 01/01/2020  . Hyperlipidemia 01/01/2020  .  Dysphagia following cerebral infarction 01/01/2020  . CKD (chronic kidney disease), stage IIIa 01/01/2020  . Obesity (BMI 30-39.9) 01/01/2020  . Urinary incontinence 01/01/2020  . HOH (hard of hearing) 01/01/2020  . Acute blood loss anemia   . Essential hypertension   .  Middle cerebral artery embolism, right 12/30/2019  . 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 Acute renal failure (ARF) ([place]) 11/08/2019  . Obstructive uropathy 11/08/2019  . Hyperkalemia 11/08/2019  . Metabolic acidosis 22/48/2500  . Hyperphosphatemia 11/08/2019   PCP:  Maryella Shivers, MD Pharmacy:   [id number] - [place], [place] - [address] [facility] [place] [address] Phone: [phone] Fax: [phone]     Social Determinants of Health (SDOH) Interventions    Readmission Risk Interventions No flowsheet data found.

## 2020-01-06 NOTE — Progress Notes (Signed)
STROKE TEAM PROGRESS NOTE   SUBJECTIVE (INTERVAL HISTORY) Patient is lying in bed.  She has significant abdominal distention and ileus.  She has had some watery diarrhea.  She threw up twice today.  Her potassium was low at 2.9.  Vital signs are stable.  No neurological changes.  No family at the bedside  OBJECTIVE Temp:  [98.6 F (37 C)-98.9 F (37.2 C)] 98.7 F (37.1 C) (07/05 0817) Pulse Rate:  [60-79] 60 (07/05 0817) Cardiac Rhythm: Atrial fibrillation (07/05 0700) Resp:  [19-22] 20 (07/05 0817) BP: (135-156)/(71-79) 147/79 (07/05 0817) SpO2:  [96 %-100 %] 96 % (07/05 0817) Weight:  [95.8 kg] 95.8 kg (07/05 0337)  Recent Labs  Lab 01/05/20 2001 01/06/20 0001 01/06/20 0359 01/06/20 0815 01/06/20 1245  GLUCAP 122* 122* 120* 127* 112*   Recent Labs  Lab 12/31/19 0342 12/31/19 0342 12/31/19 1211 12/31/19 1655 01/01/20 0615 01/01/20 0615 01/02/20 0535 01/03/20 0442 01/06/20 0719  NA 141  --   --   --  142  --  141 140 139  K 4.7  --   --   --  4.3  --  4.2 4.4 2.9*  CL 112*  --   --   --  112*  --  108 108 108  CO2 19*  --   --   --  22  --  25 23 20*  GLUCOSE 103*  --   --   --  125*  --  119* 126* 131*  BUN 33*  --   --   --  32*  --  31* 31* 40*  CREATININE 1.88*  --   --   --  1.64*  --  1.49* 1.32* 1.49*  CALCIUM 8.0*   < >  --   --  8.1*   < > 8.1* 8.4* 8.3*  MG 1.9  --  1.8 1.8 1.8  --   --   --   --   PHOS  --   --  4.0 3.5 3.3  --   --   --   --    < > = values in this interval not displayed.   Recent Labs  Lab 01/06/20 0719  AST 48*  ALT 26  ALKPHOS 71  BILITOT 0.9  PROT 5.7*  ALBUMIN 2.6*   Recent Labs  Lab 12/31/19 0342 12/31/19 0342 01/01/20 0615 01/01/20 1446 01/02/20 0535 01/03/20 0442 01/06/20 0719  WBC 10.4  --  6.0  --  5.1 5.2 7.5  HGB 7.8*   < > 7.3* 8.7* 8.6* 9.3* 10.4*  HCT 24.7*   < > 23.5* 27.3* 27.2* 29.4* 32.7*  MCV 103.8*  --  104.9*  --  103.0* 103.2* 104.5*  PLT 223  --  157  --  175 212 339   < > = values in this  interval not displayed.    IMAGING No results found.   PHYSICAL EXAM  Elderly Caucasian lady seems to be in mild distress due to abdominal distention. . Afebrile. Head is nontraumatic. Neck is supple without bruit.    Cardiac exam no murmur or gallop. Lungs are clear to auscultation. Distal pulses are well felt.  She is extremely hard of hearing.  Abdomen greatly distended.  Bowel sounds very diminished.  Slight tenderness over abdomen diffusely Neurological Exam :  Patient is deaf and difficult to communicate with.  She is lying in bed.  She has right gaze preference and head deviation.  She follows simple commands.  Speech is slow but clear.  Right gaze deviation.  Does not blink to threat on the left but does so on the right.  Left lower facial weakness.  Tongue midline.  Left upper extremity is flaccid and weak.  Withdraws left lower extremity to pain.  Purposeful antigravity movements on the right side. ASSESSMENT/PLAN Ms. DANESSA MENSCH is a 74 y.o. female with history of bilateral ureteral calculi, HTN, Atrial Fibrillation not on anticoagulation (not on [facility] due to high risk of falls), who presents with left sided hemiplegia and right gaze preference. CTH in the ED revealed left frontal meningioma, CTA with a right M1 occlusion. She received IVTPA on 12/29/19 at 1830 w/LKW 1645 and was taken for mechanical thrombectomy with resulting TICI 2B revascularization w/placement of recue stent in the right MCA.  Stroke - R MCA and PCA infarcts due to R M1 occlusion s/p tPA and EVT with TICI2b and R MCA stenting, likely cardio embolic due to AF not on [facility]   Code Stroke CT head No acute abnormality. Small vessel disease. Atrophy. ASPECTS 10.     CTA head & neck R M1 occlusion, fetal PCA on the right  Cerebral angio R M1 occlusion w/p TICI 2b revascularization. 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    Post IR CT hyperdensity R subarachnoid space, stable L parafalcine  meningioma  MRI  R occipital infarct. R insular and frontal operculum infarct. Small scattered R frontal and parietal infarcts.  MRA  fetal R PCA. Missing superior division R MCA. R MCA stent, inferior division with flow.  LE Doppler  No DVT  2D Echo EF > 75%, severe LVH. Mild to moderate TVR.  LDL 90  HgbA1c 5.2  Heparin subq for VTE prophylaxis  aspirin 325 mg daily prior to admission, now on aspirin 81 mg daily and Brilinta (ticagrelor) 90 mg bid given stent placement. Plan to continue Brilinta at d/c with [facility] in 5-7 days (around 01/09/20 if anemia stable).   Therapy recommendations:  CIR  Disposition:  pending   Ileus/SBO  Extremely distended Abd  KUB 7/3 w/ dilated small bowel loop c/w ileus  Stop TF and po intake except aspirin and brilinta  Pt w/ 2 BMs this am, 1 loose and 1 soft. Had BMs over the weekend  Nausea w/ vomiting this am  IM consulted. Likely needs decompression  Acute Respiratory Failure, resolved  intubated for IR, left intubated for airway protection  Extubation 6/29  Tolerated well  Hematoma R groin  pseudoanerysm w/ extravasation following IR  CT abd & pelvis R inguinal hematoma into abdominal wall. Stable B ureteral stents and B adrenal adenoma. Aortic atherosclerosis.    S/p 1u PRBC for Hgb 7.3  [place] right groin x 2 - no pseudoaneurysm   Hgb now 10.4  Hematuria, resolved  S/p B ureteral stents exchange 6/4 d/t B renal calculi  ABD CT Mild B hydronephrosis, stents in place  Hgb 9.2->8.8->8.4->7.8->7.3 - 1u PRBC - 8.7->8.6->9.3->10.4  Urology consulted, appreciate help  Ureteral stents removed by urology 6/30 followed by 1 dose cipro  No gross hematuria now  ongoing Cre monitor, if elevated, consider [place] abd to rule out hydro  Cre 1.49  Atrial Fibrillation  Home anticoagulation:  none d/t high fall risk  Not timing for [facility] at this time  Plan with [facility] in 5-7 days (around 01/09/20) if anemia stable   Left leg  swelling  LE venous doppler no DVT  XR of  left knee severe OA and hip neg  On heparin subq for DVT prophylaxis   Continue SCDs  Hypertension  Home meds:  Coreg 6.25 bid  Stable 110-140  SBP goal now < 180  Long-term BP goal normotensive  Hyperlipidemia  Home meds:  No statin  LDL 90, goal < 70  lipitor 40 now  Continue statin at discharge  Dysphagia  Secondary to stroke  Cleared for dys1 with nectar thick, poor intake  Has Cortrak   On TF @  25 to facilitate oral intake, now on hold  Speech on board  Increased IVF to NS w/ 20K at 70h   CKD stage IIIab  Cre 1.49  Could be due to contrast nephrotoxicity  Increased IVF   Continue monitoring BMP  Other Stroke Risk Factors  Advanced age  Obesity, Body mass index is 38.59 kg/m., recommend weight loss, diet and exercise as appropriate   Other Active Problems  Urinary incontinence on mirabegron PTA  HOH  Transient bradycardia w/ HR to 33 7/5  hypokalemia 2.9 - supplement - recheck in am  Hospital day # 8   Patient presented with embolic stroke and underwent successful thrombectomy with rescue stent placement for intracranial atherosclerosis.  She unfortunately has developed abdominal distention and ileus.  Plan getting internal medicine consult to help with management of her distended abdomen and ileus.  We will hold tube feeds for now.  Start IV fluids with potassium replacement.  Discussed with medical hospitalist and patient and answered questions.  Greater than 50% time during this 35-minute visit was spent in counseling and coordination of care and discussion with care team.  Antony Contras, MD     To contact Stroke Continuity provider, please refer to [email or web address] After hours, contact General Neurology

## 2020-01-06 NOTE — Progress Notes (Signed)
Inpatient Rehab Admissions Coordinator:   Spoke with pt's brother, [name], over the phone. Per [name], pt lives alone, and he would not be able to provide any physical assistance to her, nor are either of their homes w/c accessible.  We discussed barriers to CIR including limited support/accessability at home, and possible lack of insurance coverage for SNF rehab after CIR.  Will discuss with TOC team and continue to follow.   Shann Medal, PT, DPT Admissions Coordinator [phone] 01/06/20  12:25 PM

## 2020-01-06 NOTE — Consult Note (Signed)
[facility] Medical Consultation  Gloria [name] KGY:[id number] DOB: 1946-05-24 DOA: 12/29/2019 PCP: Maryella Shivers, MD   Requesting physician: Mamie Nick Date of consultation: 01/06/2020 Reason for consultation: SBO  Impression/Recommendations Principal Problem:   Stroke (cerebrum) ([place]) - R MCA & PCA d.t R M1 occlusion s/p tPA and IR w/ partial revascularization and R MCA stenting, embolic d/t AF not on [facility] Active Problems:   Middle cerebral artery embolism, right   Atrial fibrillation (HCC)   Groin hematoma, R s/p IR for stroke   Hematuria   Accelerated hypertension   Hyperlipidemia   Dysphagia following cerebral infarction   CKD (chronic kidney disease), stage IIIa   Obesity (BMI 30-39.9)   Urinary incontinence   HOH (hard of hearing)   Acute blood loss anemia   Essential hypertension    1. Ileus.  Likely from prolonged immobility secondary to stroke and one side paresis, patient had 1 vomiting this morning.  Discussed with patient nurse at bedside, patient passed diarrhea this morning.  On physical exam patient very distended with increased bowel sounds, since the patient has a partial bowel obstruction. -Dulcolax suppository twice daily x2 -Repeat abdominal x-ray tomorrow -Replenish potassium, check magnesium and phosphate -Encourage activity as tolerated -If symptoms not improving, consider start NGT suction, discussed with nurse at bedside currently patient has a Dobbhoff may need to change to regular NGT for suction if needed  2.  Stroke with paresis, on aspirin and Brilinta, rest as per primary team  3.  Hypertension fairly controlled, continue current regimen  4.  Paroxysmal A. fib, rate controlled, not on anticoagulation for frequent falls.  5.  Acute blood loss anemia, probably from the hematoma of the right groin area.  Continue to monitor H&H level.  I will followup again tomorrow. Please contact me if I can be of assistance in the meanwhile. Thank  you for this consultation.  Chief Complaint: Abd pain  HPI:  74 year old woman with history of hypertension, A. fib not on anticoagulation presenting with left-sided weakness and right gaze preference. Found to have ischemic CVA with right M1 occlusion s/p tPA 12/29/2019, revascularization of right MCA M1 segment as well as placement of rescue stent in the right MCA.  Postprocedure complicated with right groin hematoma.  Patient paresis and dysphagia recovered slowly.  NG tube was inserted for feeding.  This morning nurse noticed patient threw up NGT feeding x1.  NG tube was clamped.  Nurse also reported patient had one episode of loose bowel movement this morning.  And patient continued to pass gas this morning as well.  Review of Systems:  10 points of review system done negative except those mentioned in HPI  Past Medical History:  Diagnosis Date  . Arthritis    knees  . Atrial fibrillation ([place])   . Dysrhythmia   . History of kidney stones   . HOH (hard of hearing)   . Hypertension    Past Surgical History:  Procedure Laterality Date  . CHOLECYSTECTOMY    . CYSTOSCOPY WITH STENT PLACEMENT Bilateral 11/09/2019   Procedure: CYSTOSCOPY BILATERAL URETERAL STENT PLACEMENT;  Surgeon: Ardis Hughs, MD;  Location: [facility];  Service: Urology;  Laterality: Bilateral;  . CYSTOSCOPY/URETEROSCOPY/HOLMIUM LASER/STENT PLACEMENT Right 11/22/2019   Procedure: CYSTOSCOPY RIGHT URETEROSCOPY/HOLMIUM LASER STONE EXTRACTION /STENT EXCHANGE;  Surgeon: Ardis Hughs, MD;  Location: [facility];  Service: Urology;  Laterality: Right;  . CYSTOSCOPY/URETEROSCOPY/HOLMIUM LASER/STENT PLACEMENT Left 12/06/2019   Procedure: CYSTOSCOPY BILATERAL URETEROSCOPY/HOLMIUM LASER/STENT EXCHANGE;  Surgeon: Ardis Hughs,  MD;  Location: [facility];  Service: Urology;  Laterality: Left;  . CYSTOSCOPY/URETEROSCOPY/HOLMIUM LASER/STENT PLACEMENT Bilateral 12/25/2019   Procedure: BILATERAL URETEROSCOPY/HOLMIUM LASER LEFT,  BILATERAL STONE EXTRACTION BILATERAL STENT EXCHANGE;  Surgeon: Ardis Hughs, MD;  Location: [facility];  Service: Urology;  Laterality: Bilateral;  . IR CT HEAD LTD  12/30/2019  . IR INTRA CRAN STENT  12/30/2019  . IR PERCUTANEOUS ART THROMBECTOMY/INFUSION INTRACRANIAL INC DIAG ANGIO  12/30/2019  . RADIOLOGY WITH ANESTHESIA N/A 12/29/2019   Procedure: IR WITH ANESTHESIA;  Surgeon: Radiologist, Medication, MD;  Location: [place];  Service: Radiology;  Laterality: N/A;   Social History:  reports that she has never smoked. She has never used smokeless tobacco. She reports that she does not drink alcohol and does not use drugs.  Allergies  Allergen Reactions  . Aspartame And Phenylalanine Other (See Comments)    unknown  . Azithromycin Other (See Comments)    Unknown  . Bee Venom Other (See Comments)    Hyper  . Cortizone-5 [Hydrocortisone] Itching and Swelling  . Olmesartan Other (See Comments)    unknown  . Other Other (See Comments)    IV dye Dyes - unknown reaction   . Penicillins Other (See Comments)    unknown  . Valsartan Other (See Comments)    Unknown  . Latex Rash and Other (See Comments)    Skin peels   History reviewed. No pertinent family history.  Prior to Admission medications   Medication Sig Start Date End Date Taking? Authorizing Provider  aspirin 325 MG tablet Take 325 mg by mouth daily.   Yes [provider]  carvedilol (COREG) 6.25 MG tablet Take 6.25 mg by mouth 2 (two) times daily. 11/08/19  Yes [provider]  furosemide (LASIX) 20 MG tablet Take 1 tablet (20 mg total) by mouth as needed for fluid. Patient taking differently: Take 20 mg by mouth daily as needed for fluid.  11/15/19  Yes Pokhrel, [place], MD  magnesium oxide (MAG-OX) 400 MG tablet Take 400 mg by mouth 2 (two) times daily. 11/12/19  Yes [provider]  traMADol (ULTRAM) 50 MG tablet Take 1-2 tablets (50-100 mg total) by mouth every 6 (six) hours as needed for moderate  pain. 12/06/19  Yes Ardis Hughs, MD  mirabegron ER (MYRBETRIQ) 25 MG TB24 tablet Take 1 tablet (25 mg total) by mouth daily. 11/22/19   Ardis Hughs, MD   Physical Exam: Blood pressure (!) 143/85, pulse 78, temperature 98.6 F (37 C), temperature source Oral, resp. rate 18, weight 95.8 kg, SpO2 96 %. Vitals:   01/06/20 1149 01/06/20 1543  BP: (!) 141/82 (!) 143/85  Pulse: 70 78  Resp: 18 18  Temp: 98.3 F (36.8 C) 98.6 F (37 C)  SpO2: 96% 96%     General: Sleepy  Eyes: Right-sided gaze  ENT: No swelling  Neck: Supple no JVD  Cardiovascular: Irregular heartbeat, soft systolic murmur on heart base  Respiratory: Lung sounds clear bilaterally no crackles  Abdomen: Distended, increased bowel sounds on all quadrants.  Mild tenderness on periumbilical area no rebound no guarding  Skin: Right upper thigh and groin area large hematoma  Musculoskeletal: Weak  Psychiatric: Calm  Neurologic: Left-sided weakness and right gaze.  Labs on Admission:  Basic Metabolic Panel: Recent Labs  Lab 12/31/19 0342 12/31/19 1211 12/31/19 1655 01/01/20 0615 01/02/20 0535 01/03/20 0442 01/06/20 0719  NA 141  --   --  142 141 140 139  K 4.7  --   --  4.3 4.2 4.4 2.9*  CL 112*  --   --  112* 108 108 108  CO2 19*  --   --  22 25 23  20*  GLUCOSE 103*  --   --  125* 119* 126* 131*  BUN 33*  --   --  32* 31* 31* 40*  CREATININE 1.88*  --   --  1.64* 1.49* 1.32* 1.49*  CALCIUM 8.0*  --   --  8.1* 8.1* 8.4* 8.3*  MG 1.9 1.8 1.8 1.8  --   --  2.1  PHOS  --  4.0 3.5 3.3  --   --  4.2   Liver Function Tests: Recent Labs  Lab 01/06/20 0719  AST 48*  ALT 26  ALKPHOS 71  BILITOT 0.9  PROT 5.7*  ALBUMIN 2.6*   No results for input(s): LIPASE, AMYLASE in the last 168 hours. No results for input(s): AMMONIA in the last 168 hours. CBC: Recent Labs  Lab 12/31/19 0342 12/31/19 0342 01/01/20 0615 01/01/20 1446 01/02/20 0535 01/03/20 0442 01/06/20 0719  WBC 10.4  --   6.0  --  5.1 5.2 7.5  HGB 7.8*   < > 7.3* 8.7* 8.6* 9.3* 10.4*  HCT 24.7*   < > 23.5* 27.3* 27.2* 29.4* 32.7*  MCV 103.8*  --  104.9*  --  103.0* 103.2* 104.5*  PLT 223  --  157  --  175 212 339   < > = values in this interval not displayed.   Cardiac Enzymes: No results for input(s): CKTOTAL, CKMB, CKMBINDEX, TROPONINI in the last 168 hours. BNP: Invalid input(s): POCBNP CBG: Recent Labs  Lab 01/06/20 0001 01/06/20 0359 01/06/20 0815 01/06/20 1245 01/06/20 1539  GLUCAP 122* 120* 127* 112* 112*    Radiological Exams on Admission: DG Abd Portable 1V  Result Date: 01/04/2020 CLINICAL DATA:  Abdominal distension EXAM: PORTABLE ABDOMEN - 1 VIEW COMPARISON:  CT abdomen pelvis dated 12/30/2019 FINDINGS: An enteric tube terminates in the stomach. Previously seen nephroureteral stents are no longer identified. Cholecystectomy clips are noted. Enteric contrast is seen in the a cecum and ascending colon. A dilated loop of small bowel overlies the left lower abdomen. Air-fluid levels and free intraperitoneal air cannot be excluded on the supine exam. Degenerative changes are seen in the hips and spine. IMPRESSION: 1. Dilated loop of small bowel in the left lower abdomen. This is nonspecific for ileus or small-bowel obstruction on this supine exam. Electronically Signed   By: Zerita Boers M.D.   On: 01/04/2020 17:06    EKG, none  Time spent: [facility] Pager [phone]  01/06/2020, 4:37 PM

## 2020-01-06 NOTE — Progress Notes (Signed)
SLP Cancellation Note  Patient Details Name: MILIA WARTH MRN: [id number] DOB: 25-Apr-1946   Cancelled treatment:       Reason Eval/Treat Not Completed: Fatigue/lethargy limiting ability to participate (Pt was approached for treatment. Pt was NPO secondary to abdominal distension. She was approached by SLP for treatment and initially responded to the SLP. However, she exhibited difficulty maintaining alertness for long enough periods to participate in the session. SLP will follow up on subsequent date.)  Aksh Swart I. Hardin Negus, [place], [place] Office number [phone] Pager [place] 01/06/2020, 3:23 PM

## 2020-01-06 NOTE — Progress Notes (Signed)
RN notified by tele, pt HR dropped down to 33 but is now back up into the mid 80s. RN assessed patient, pt alert. MD Leonie Man has been notified.

## 2020-01-06 NOTE — NC FL2 (Addendum)
[place] LEVEL OF CARE SCREENING TOOL     IDENTIFICATION  Patient Name: Gloria [name] Birthdate: [birth year] Sex: female Admission Date (Current Location): 12/29/2019  [facility] and [place] Number:  [employer] and Address:  [facility] [place], [place] [address] [place], [place] [address]      Provider Number: [id number]  Attending Physician Name and Address:  Garvin Fila, MD  Relative Name and Phone Number:  [name], son, [phone]    Current Level of Care: Hospital Recommended Level of Care: [place] Prior Approval Number:    Date Approved/Denied:   PASRR Number: [id number]  Discharge Plan: SNF    Current Diagnoses: Patient Active Problem List   Diagnosis Date Noted   Atrial fibrillation ([place]) 01/01/2020   Groin hematoma, R s/p IR for stroke 01/01/2020   Hematuria 01/01/2020   Accelerated hypertension 01/01/2020   Hyperlipidemia 01/01/2020   Dysphagia following cerebral infarction 01/01/2020   CKD (chronic kidney disease), stage IIIa 01/01/2020   Obesity (BMI 30-39.9) 01/01/2020   Urinary incontinence 01/01/2020   HOH (hard of hearing) 01/01/2020   Acute blood loss anemia    Essential hypertension    Middle cerebral artery embolism, right 12/30/2019   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 s/p tPA and IR w/ partial revascularization and R MCA stenting, embolic d/t AF not on [facility] 12/29/2019   Acute renal failure (ARF) ([place]) 11/08/2019   Obstructive uropathy 11/08/2019   Hyperkalemia 84/13/2440   Metabolic acidosis 04/30/2535   Hyperphosphatemia 11/08/2019    Orientation RESPIRATION BLADDER Height & Weight     Self, Time, Situation, Place  Normal Incontinent, External catheter Weight: 211 lb 3.2 oz (95.8 kg) Height:     BEHAVIORAL SYMPTOMS/MOOD NEUROLOGICAL BOWEL NUTRITION STATUS      Incontinent Diet (Please see DC Summary)  AMBULATORY STATUS COMMUNICATION OF NEEDS Skin    Extensive Assist Verbally Surgical wounds (Closed incision on Perineum)                       Personal Care Assistance Level of Assistance  Bathing, Feeding, Dressing Bathing Assistance: Maximum assistance Feeding assistance: Maximum assistance Dressing Assistance: Maximum assistance     Functional Limitations Info  Hearing   Hearing Info: Impaired      SPECIAL CARE FACTORS FREQUENCY  PT (By licensed PT), OT (By licensed OT)     PT Frequency: 5x/week OT Frequency: 5x/week            Contractures Contractures Info: Not present    Additional Factors Info  Code Status, Allergies Code Status Info: Full Allergies Info: Aspartame And Phenylalanine, Azithromycin, Bee Venom, Cortizone-5 (Hydrocortisone), Olmesartan, Other, Penicillins, Valsartan, Latex           Current Medications (01/06/2020):  This is the current hospital active medication list Current Facility-Administered Medications  Medication Dose Route Frequency Provider Last Rate Last Admin    stroke: mapping our early stages of recovery book   Does not apply Once Williams, Jessica N, NP       0.9 % NaCl with KCl 20 mEq/ L  infusion   Intravenous Continuous Donzetta Starch, NP       acetaminophen (TYLENOL) tablet 650 mg  650 mg Oral Q4H PRN Vonzella Nipple, NP       Or   acetaminophen (TYLENOL) 160 MG/5ML solution 650 mg  650 mg Per Tube Q4H PRN Vonzella Nipple, NP  650 mg at 01/03/20 0756   Or   acetaminophen (TYLENOL) suppository 650 mg  650 mg Rectal Q4H PRN Vonzella Nipple, NP       aspirin chewable tablet 81 mg  81 mg Oral Daily Deveshwar, Sanjeev, MD       Or   aspirin chewable tablet 81 mg  81 mg Per Tube Daily Deveshwar, Sanjeev, MD   81 mg at 01/06/20 1005   chlorhexidine (PERIDEX) 0.12 % solution 15 mL  15 mL Mouth Rinse BID Rosalin Hawking, MD   15 mL at 01/06/20 1005   Chlorhexidine Gluconate Cloth 2 % PADS 6 each  6 each Topical Daily Aroor, Lanice Schwab, MD   6 each at 01/06/20 1005    heparin injection 5,000 Units  5,000 Units Subcutaneous Q8H Rosalin Hawking, MD   5,000 Units at 01/06/20 0514   MEDLINE mouth rinse  15 mL Mouth Rinse q12n4p Rosalin Hawking, MD   15 mL at 01/05/20 1240   potassium chloride 10 mEq in 100 mL IVPB  10 mEq Intravenous Q1 Hr x 3 Biby, Sharon L, NP 100 mL/hr at 01/06/20 1150 10 mEq at 01/06/20 1150   senna-docusate (Senokot-S) tablet 1 tablet  1 tablet Oral QHS PRN Vonzella Nipple, NP       ticagrelor (BRILINTA) tablet 90 mg  90 mg Oral BID Luanne Bras, MD       Or   ticagrelor (BRILINTA) tablet 90 mg  90 mg Per Tube BID Luanne Bras, MD   90 mg at 01/06/20 1006     Discharge Medications: Please see discharge summary for a list of discharge medications.  Relevant Imaging Results:  Relevant Lab Results:   Additional Information SSN: [id number]  Lissa Morales Rayyan, LCSW   I have personally obtained history,examined this patient, reviewed notes, independently viewed imaging studies, participated in medical decision making and plan of care.ROS completed by me personally and pertinent positives fully documented  I have made any additions or clarifications directly to the above note. Agree with note above.   Antony Contras, MD Medical Director [place] Pager: [phone] 01/06/2020 12:52 PM#

## 2020-01-07 ENCOUNTER — Inpatient Hospital Stay (HOSPITAL_COMMUNITY): Payer: PPO

## 2020-01-07 DIAGNOSIS — E7800 Pure hypercholesterolemia, unspecified: Secondary | ICD-10-CM

## 2020-01-07 DIAGNOSIS — S301XXA Contusion of abdominal wall, initial encounter: Secondary | ICD-10-CM

## 2020-01-07 DIAGNOSIS — K567 Ileus, unspecified: Secondary | ICD-10-CM

## 2020-01-07 LAB — GLUCOSE, CAPILLARY
Glucose-Capillary: 91 mg/dL (ref 70–99)
Glucose-Capillary: 98 mg/dL (ref 70–99)
Glucose-Capillary: 95 mg/dL (ref 70–99)
Glucose-Capillary: 98 mg/dL (ref 70–99)
Glucose-Capillary: 94 mg/dL (ref 70–99)
Glucose-Capillary: 88 mg/dL (ref 70–99)

## 2020-01-07 LAB — BASIC METABOLIC PANEL
Anion gap: 11 (ref 5–15)
BUN: 42 mg/dL — ABNORMAL HIGH (ref 8–23)
CO2: 21 mmol/L — ABNORMAL LOW (ref 22–32)
Calcium: 8.2 mg/dL — ABNORMAL LOW (ref 8.9–10.3)
Chloride: 111 mmol/L (ref 98–111)
Creatinine, Ser: 1.45 mg/dL — ABNORMAL HIGH (ref 0.44–1.00)
GFR calc Af Amer: 41 mL/min — ABNORMAL LOW (ref >60)
GFR calc non Af Amer: 35 mL/min — ABNORMAL LOW (ref >60)
Glucose, Bld: 102 mg/dL — ABNORMAL HIGH (ref 70–99)
Potassium: 3.4 mmol/L — ABNORMAL LOW (ref 3.5–5.1)
Sodium: 143 mmol/L (ref 135–145)

## 2020-01-07 LAB — TSH: TSH: 4.769 u[IU]/mL — ABNORMAL HIGH (ref 0.350–4.500)

## 2020-01-07 NOTE — Progress Notes (Signed)
PT Cancellation Note  Patient Details Name: Gloria [name] MRN: [id number] DOB: 02-24-1946   Cancelled Treatment:    Reason Eval/Treat Not Completed: Patient not medically ready.  Has ileus, abd distension and is a hold per nsg due to pain.  Follow up as time and pt allow.   Ramond Dial 01/07/2020, 1:27 PM   Mee Hives, PT MS Acute Rehab Dept. Number: [place] and [place]

## 2020-01-07 NOTE — Progress Notes (Signed)
OT Cancellation Note  Patient Details Name: Gloria [name] MRN: [id number] DOB: 07-09-1945   Cancelled Treatment:    Reason Eval/Treat Not Completed: Medical issues which prohibited therapy;Other (comment) Per chart review pt with ileus and abd distension. Per nursing request will hold OT session for today. Will follow up as pt medically appropriate.  Lanier Clam., COTA/L Acute Rehabilitation Services [phone] [place] 01/07/2020, 2:59 PM

## 2020-01-07 NOTE — Progress Notes (Signed)
Nutrition Follow-up  RD working remotely.  DOCUMENTATION CODES:   Obesity unspecified  INTERVENTION:  Once ileus resolves recommend resuming tube feeds: -Osmolite 1.2 at 50 mL/hr (1200 mL per day) -Pro-Stat 30 mL BID -Provides 1640 kcal, 97 grams of protein, 984 mL H2O daily  NUTRITION DIAGNOSIS:   Inadequate oral intake related to inability to eat as evidenced by NPO status.  Ongoing.  GOAL:   Patient will meet greater than or equal to 90% of their needs  Not met.  MONITOR:   Diet advancement, TF tolerance  REASON FOR ASSESSMENT:   Consult Enteral/tube feeding initiation and management  ASSESSMENT:   Pt with PMH Of bilateral ureteral calculi, HTN, Afib (not on anticoagulation due to high fall risk), and deafness (does not use sign language she reads lips) admitted with R M1 occlusion s/p tPA  And mechanical thrombectomy with R MCA stenting.   6/28 cortrak placed; tip gastric 6/29 extubated 7/2 diet advanced to dysphagia 1 with nectar-thick liquids  Per review of chart patient is deaf and does not use sign language. Patient reads lips. Per review of chart patient has had poor PO intake since diet was advanced (0-30% with average of 12.5%). Patient had an episode of emesis yesterday. Tube feeds were stopped and she was made NPO. Per abdominal x-ray findings consistent with adynamic ileus.  Medications reviewed and include: NS with KCl 20 mEq/L at 70 mL/hr.  Labs reviewed: CBG 91-98, Potassium 3.4, CO2 21, BUN 42, Creatinine 1.45.  Discussed with RN via secure chat. Plan is to continue NPO status and holding tube feeds at this time in setting of ileus. Noted palliative medicine was consulted today.  Diet Order:   Diet Order            Diet NPO time specified  Diet effective now                EDUCATION NEEDS:   No education needs have been identified at this time  Skin:  Skin Assessment: Reviewed RN Assessment  Last BM:  7/4  Height:   Ht Readings  from Last 1 Encounters:  12/23/19 5' 2"  (1.575 m)   Weight:   Wt Readings from Last 1 Encounters:  01/06/20 95.8 kg   Ideal Body Weight:  50 kg  BMI:  Body mass index is 38.63 kg/m.  Estimated Nutritional Needs:   Kcal:  1600-1800  Protein:  80-100 grams  Fluid:  >1.6 L/day  Jacklynn Barnacle, MS, RD, LDN Pager number available on Amion

## 2020-01-07 NOTE — Progress Notes (Signed)
STROKE TEAM PROGRESS NOTE   SUBJECTIVE (INTERVAL HISTORY) Patient is lying in bed.  Gloria [name] has slightly reduced abdominal distention and ileus.  Gloria [name] still has some watery diarrhea.  Gloria [name] is now NPO and tube feeds are on hold.KUB shows no change in ileus.  Her potassium is still low at 3.4  Vital signs are stable.  No neurological changes.  Her brother arrived after rounds.  Neurological exam is unchanged  OBJECTIVE Temp:  [98.1 F (36.7 C)-98.6 F (37 C)] 98.6 F (37 C) (07/06 1302) Pulse Rate:  [59-78] 59 (07/06 1302) Cardiac Rhythm: Atrial fibrillation (07/06 0756) Resp:  [17-20] 20 (07/06 1302) BP: (114-143)/(65-85) 134/65 (07/06 1302) SpO2:  [95 %-98 %] 96 % (07/06 1302)  Recent Labs  Lab 01/06/20 2011 01/06/20 2347 01/07/20 0412 01/07/20 0843 01/07/20 1259  GLUCAP 101* 101* 91 98 95   Recent Labs  Lab 12/31/19 1655 01/01/20 0615 01/01/20 0615 01/02/20 0535 01/02/20 0535 01/03/20 0442 01/06/20 0719 01/07/20 0447  NA  --  142  --  141  --  140 139 143  K  --  4.3  --  4.2  --  4.4 2.9* 3.4*  CL  --  112*  --  108  --  108 108 111  CO2  --  22  --  25  --  23 20* 21*  GLUCOSE  --  125*  --  119*  --  126* 131* 102*  BUN  --  32*  --  31*  --  31* 40* 42*  CREATININE  --  1.64*  --  1.49*  --  1.32* 1.49* 1.45*  CALCIUM  --  8.1*   < > 8.1*   < > 8.4* 8.3* 8.2*  MG 1.8 1.8  --   --   --   --  2.1  --   PHOS 3.5 3.3  --   --   --   --  4.2  --    < > = values in this interval not displayed.   Recent Labs  Lab 01/06/20 0719  AST 48*  ALT 26  ALKPHOS 71  BILITOT 0.9  PROT 5.7*  ALBUMIN 2.6*   Recent Labs  Lab 01/01/20 0615 01/01/20 1446 01/02/20 0535 01/03/20 0442 01/06/20 0719  WBC 6.0  --  5.1 5.2 7.5  HGB 7.3* 8.7* 8.6* 9.3* 10.4*  HCT 23.5* 27.3* 27.2* 29.4* 32.7*  MCV 104.9*  --  103.0* 103.2* 104.5*  PLT 157  --  175 212 339    IMAGING DG Abd Portable 1V  Result Date: 01/07/2020 CLINICAL DATA:  History of cholecystectomy. History of cystoscopy  and stent placement. EXAM: PORTABLE ABDOMEN - 1 VIEW COMPARISON:  01/04/2020. FINDINGS: Hemidiaphragms not imaged. Feeding tube noted with tip over the stomach. Distended loops of small and large bowel again noted without interim change. Findings consistent with adynamic ileus. Oral contrast in the right colon. IMPRESSION: 1.  Feeding tube noted with tip over the stomach. 2. Distended loops of small large bowel again noted without interim change. Findings consistent adynamic ileus. Continued follow-up exam suggested to demonstrate resolution and to exclude bowel obstruction. Electronically Signed   By: Marcello Moores  Register   On: 01/07/2020 07:13     PHYSICAL EXAM  Elderly Caucasian lady seems to be in mild distress due to abdominal distention. . Afebrile. Head is nontraumatic. Neck is supple without bruit.    Cardiac exam no murmur or gallop. Lungs are clear to auscultation. Distal pulses are  well felt.  Gloria [name] is extremely hard of hearing.  Abdomen greatly distended.  Bowel sounds very diminished.  Slight tenderness over abdomen diffusely Neurological Exam :  Patient is deaf and difficult to communicate with.  Gloria [name] is lying in bed.  Gloria [name] has right gaze preference and head deviation.  Gloria [name] follows simple commands.  Speech is slow but clear.  Right gaze deviation.  Does not blink to threat on the left but does so on the right.  Left lower facial weakness.  Tongue midline.  Left upper extremity is flaccid and weak.  Withdraws left lower extremity to pain.  Purposeful antigravity movements on the right side. ASSESSMENT/PLAN Gloria [name] is a 74 y.o. female with history of bilateral ureteral calculi, HTN, Atrial Fibrillation not on anticoagulation (not on [facility] due to high risk of falls), who presents with left sided hemiplegia and right gaze preference. CTH in the ED revealed left frontal meningioma, CTA with a right M1 occlusion. Gloria [name] received IVTPA on 12/29/19 at 1830 w/LKW 1645 and was taken for mechanical  thrombectomy with resulting TICI 2B revascularization w/placement of recue stent in the right MCA.  Stroke - R MCA and PCA infarcts due to R M1 occlusion s/p tPA and EVT with TICI2b and R MCA stenting, likely cardio embolic due to AF not on [facility]   Code Stroke CT head No acute abnormality. Small vessel disease. Atrophy. ASPECTS 10.     CTA head & neck R M1 occlusion, fetal PCA on the right  Cerebral angio R M1 occlusion w/p TICI 2b revascularization. 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    Post IR CT hyperdensity R subarachnoid space, stable L parafalcine meningioma  MRI  R occipital infarct. R insular and frontal operculum infarct. Small scattered R frontal and parietal infarcts.  MRA  fetal R PCA. Missing superior division R MCA. R MCA stent, inferior division with flow.  LE Doppler  No DVT  2D Echo EF > 75%, severe LVH. Mild to moderate TVR.  LDL 90  HgbA1c 5.2  Heparin subq for VTE prophylaxis  aspirin 325 mg daily prior to admission, now on aspirin 81 mg daily and Brilinta (ticagrelor) 90 mg bid given stent placement. Plan to continue Brilinta at d/c with [facility] in 5-7 days (around 01/09/20 if anemia stable).   Therapy recommendations:  CIR  Disposition:  pending   Ileus/SBO  Extremely distended Abd  KUB 7/3 w/ dilated small bowel loop c/w ileus  Stop TF and po intake except aspirin and brilinta  Pt w/ 2 BMs this am, 1 loose and 1 soft. Had BMs over the weekend  Nausea w/ vomiting this am  IM consulted. Likely needs decompression  Acute Respiratory Failure, resolved  intubated for IR, left intubated for airway protection  Extubation 6/29  Tolerated well  Hematoma R groin  pseudoanerysm w/ extravasation following IR  CT abd & pelvis R inguinal hematoma into abdominal wall. Stable B ureteral stents and B adrenal adenoma. Aortic atherosclerosis.    S/p 1u PRBC for Hgb 7.3  [place] right groin x 2 - no pseudoaneurysm   Hgb now  10.4  Hematuria, resolved  S/p B ureteral stents exchange 6/4 d/t B renal calculi  ABD CT Mild B hydronephrosis, stents in place  Hgb 9.2->8.8->8.4->7.8->7.3 - 1u PRBC - 8.7->8.6->9.3->10.4  Urology consulted, appreciate help  Ureteral stents removed by urology 6/30 followed by 1 dose cipro  No gross hematuria now  ongoing Cre monitor, if  elevated, consider [place] abd to rule out hydro  Cre 1.49  Atrial Fibrillation  Home anticoagulation:  none d/t high fall risk  Not timing for [facility] at this time  Plan with [facility] in 5-7 days (around 01/09/20) if anemia stable   Left leg swelling  LE venous doppler no DVT  XR of left knee severe OA and hip neg  On heparin subq for DVT prophylaxis   Continue SCDs  Hypertension  Home meds:  Coreg 6.25 bid  Stable 110-140  SBP goal now < 180  Long-term BP goal normotensive  Hyperlipidemia  Home meds:  No statin  LDL 90, goal < 70  lipitor 40 now  Continue statin at discharge  Dysphagia  Secondary to stroke  Cleared for dys1 with nectar thick, poor intake  Has Cortrak   On TF @  25 to facilitate oral intake, now on hold  Speech on board  Increased IVF to NS w/ 20K at 70h   CKD stage IIIab  Cre 1.49  Could be due to contrast nephrotoxicity  Increased IVF   Continue monitoring BMP  Other Stroke Risk Factors  Advanced age  Obesity, Body mass index is 38.59 kg/m., recommend weight loss, diet and exercise as appropriate   Other Active Problems  Urinary incontinence on mirabegron PTA  HOH  Transient bradycardia w/ HR to 33 7/5  hypokalemia 2.9 - supplement - recheck in am  Hospital day # 9   Patient presented with embolic stroke and underwent successful thrombectomy with rescue stent placement for intracranial atherosclerosis.  Gloria [name] unfortunately has developed abdominal distention and ileus.  Appreciate help from internal medicine consult to help with management of her distended abdomen and ileus.  Plan  discontinue vandetanib and use NG tube to suction for decompression of her distended abdomen.  Continue  IV fluids with potassium replacement.  Discussed with Dr. Joneen Roach medical hospitalist and patient and answered questions.  Long discussion at the bedside with patient's health power of attorney and answered questions about the prognosis and plan of care.  Recommend palliative care consult to discuss goals of care and final decision about inpatient rehab versus skilled nursing facility after improvement in her ongoing medical condition after a few days.  Greater than 50% time during this 25-minute visit was spent in counseling and coordination of care and discussion with care team.  Antony Contras, MD     To contact Stroke Continuity provider, please refer to [email or web address] After hours, contact General Neurology

## 2020-01-07 NOTE — Progress Notes (Signed)
SLP Cancellation Note  Patient Details Name: DAVINE SWENEY MRN: [id number] DOB: July 07, 1945   Cancelled treatment:       Reason Eval/Treat Not Completed: Pain limiting ability to participate;Fatigue/lethargy limiting ability to participate;Medical issues which prohibited therapy  Pt with ileus, abdominal distension, made NPO; politely declined cognitive therapy this date due to not feeling well. ST service to follow for therapy as appropriate.  Tyreke Kaeser P Folashade Gamboa 01/07/2020, 2:01 PM

## 2020-01-07 NOTE — Progress Notes (Signed)
PROGRESS NOTE    Gloria [name]  BPZ:[id number]  DOB: [birth year]  PCP: Maryella Shivers, MD Admit date:12/29/2019 Chief compliant: medical consult requested for abd distension, n/v/d Hospital course: Patient is a 73 y.o. female with history of bilateral ureteral calculi, HTN, Atrial Fibrillation not on anticoagulation (not on [facility] due to high risk of falls), who presents with left sided hemiplegia and right gaze preference. CTH in the ED revealed left frontal meningioma, CTA with a right M1 occlusion. She received IVTPA on 12/29/19 and was taken for mechanical thrombectomy as well as revascularization w/placement of recue stent in the right MCA.  MRI revealed right occipital infarct, right insular and frontal operculum infarct, small scattered right frontal and parietal infarcts.  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  Patient was intubated for IR procedure, airway protection and extubated on 6/29..  Following IR procedure patient also developed hematoma in the right groin due to pseudoaneurysm with extravasation.  CT abdomen and pelvis showed hematoma extension into abdominal wall.  Patient received 1 unit of PRBC for hemoglobin 7.3.  Ultrasonogram of the right groin however did not reveal any pseudoaneurysm and hemoglobin been stable around 10.  Now on aspirin and Brilinta given stent placement with plans to discharge on anticoagulation/Brilinta in 5 to 7 days. Hospital course complicated by abdominal distention associated with nausea, vomiting over the weekend although patient been having BMs.  KUB on 7/3 showed dilated small bowel loops consistent with ileus.  Patient been on cortrak for tube feeds given dysphagia as a resultant of stroke.  Speech has cleared for dysphagia 1 with nectar thick.  [facility] service consulted on 7/5 due to persistent symptoms for further evaluation and  management.   Subjective:  Patient sleeping comfortably and in no acute distress.  She is easily arousable but noted to have right gaze preference with left hemiparesis.  Objective: Vitals:   01/06/20 2013 01/06/20 2347 01/07/20 0413 01/07/20 0815  BP: 114/85 134/78 (!) 142/72 137/76  Pulse: 74 72 69 71  Resp: 17 17 18 20   Temp: 98.1 F (36.7 C) 98.2 F (36.8 C) 98.3 F (36.8 C) 98.3 F (36.8 C)  TempSrc: Oral Oral Oral Oral  SpO2: 95% 98% 96% 97%  Weight:        Intake/Output Summary (Last 24 hours) at 01/07/2020 0844 Last data filed at 01/07/2020 0817 Gross per 24 hour  Intake 948.12 ml  Output 100 ml  Net 848.12 ml   Filed Weights   01/04/20 0422 01/05/20 0315 01/06/20 0337  Weight: 99.2 kg 96.5 kg 95.8 kg    Physical Examination:  General: Moderately built, no acute distress noted Head ENT: Right gaze preference  heart: S1-S2 heard, regular rate and rhythm, no murmurs.  No leg edema noted Lungs: Equal air entry bilaterally, no rhonchi or rales on exam, no accessory muscle use Abdomen: Notably distended, tympanic, hyperactive bowel sounds heard in abdomen and also lower chest,  nontender. Extremities: Trace to 1+ pitting edema in bilateral lower extremities.  No cyanosis or clubbing. Neurological: Awake but not very communicative, left hemiparesis, right gaze preference, could not test further. Skin: No wounds or rashes.  Data Reviewed: I have personally reviewed following labs and imaging studies  CBC: Recent Labs  Lab 01/01/20 0615 01/01/20 1446 01/02/20 0535 01/03/20 0442 01/06/20 0719  WBC 6.0  --  5.1 5.2 7.5  HGB 7.3* 8.7* 8.6* 9.3* 10.4*  HCT 23.5* 27.3* 27.2* 29.4* 32.7*  MCV 104.9*  --  103.0* 103.2* 104.5*  PLT 157  --  175 212 361   Basic Metabolic Panel: Recent Labs  Lab 12/31/19 1211 12/31/19 1655 01/01/20 0615 01/02/20 0535 01/03/20 0442 01/06/20 0719 01/07/20 0447  NA  --   --  142 141 140 139 143  K  --   --  4.3 4.2 4.4 2.9*  3.4*  CL  --   --  112* 108 108 108 111  CO2  --   --  22 25 23  20* 21*  GLUCOSE  --   --  125* 119* 126* 131* 102*  BUN  --   --  32* 31* 31* 40* 42*  CREATININE  --   --  1.64* 1.49* 1.32* 1.49* 1.45*  CALCIUM  --   --  8.1* 8.1* 8.4* 8.3* 8.2*  MG 1.8 1.8 1.8  --   --  2.1  --   PHOS 4.0 3.5 3.3  --   --  4.2  --    GFR: Estimated Creatinine Clearance: 36.8 mL/min (A) (by C-G formula based on SCr of 1.45 mg/dL (H)). Liver Function Tests: Recent Labs  Lab 01/06/20 0719  AST 48*  ALT 26  ALKPHOS 71  BILITOT 0.9  PROT 5.7*  ALBUMIN 2.6*   No results for input(s): LIPASE, AMYLASE in the last 168 hours. No results for input(s): AMMONIA in the last 168 hours. Coagulation Profile: No results for input(s): INR, PROTIME in the last 168 hours. Cardiac Enzymes: No results for input(s): CKTOTAL, CKMB, CKMBINDEX, TROPONINI in the last 168 hours. BNP (last 3 results) No results for input(s): PROBNP in the last 8760 hours. HbA1C: No results for input(s): HGBA1C in the last 72 hours. CBG: Recent Labs  Lab 01/06/20 1539 01/06/20 2011 01/06/20 2347 01/07/20 0412 01/07/20 0843  GLUCAP 112* 101* 101* 91 98   Lipid Profile: No results for input(s): CHOL, HDL, LDLCALC, TRIG, CHOLHDL, LDLDIRECT in the last 72 hours. Thyroid Function Tests: Recent Labs    01/07/20 0447  TSH 4.769*   Anemia Panel: No results for input(s): VITAMINB12, FOLATE, FERRITIN, TIBC, IRON, RETICCTPCT in the last 72 hours. Sepsis Labs: No results for input(s): PROCALCITON, LATICACIDVEN in the last 168 hours.  Recent Results (from the past 240 hour(s))  SARS Coronavirus 2 by RT PCR (hospital order, performed in [facility] hospital lab) Nasopharyngeal Nasopharyngeal Swab     Status: None   Collection Time: 12/29/19  7:02 PM   Specimen: Nasopharyngeal Swab  Result Value Ref Range Status   SARS Coronavirus 2 NEGATIVE NEGATIVE Final    Comment: (NOTE) SARS-CoV-2 target nucleic acids are NOT DETECTED.  The  SARS-CoV-2 RNA is generally detectable in upper and lower respiratory specimens during the acute phase of infection. The lowest concentration of SARS-CoV-2 viral copies this assay can detect is 250 copies / mL. A negative result does not preclude SARS-CoV-2 infection and should not be used as the sole basis for treatment or other patient management decisions.  A negative result may occur with improper specimen collection / handling, submission of specimen other than nasopharyngeal swab, presence of viral mutation(s) within the areas targeted by this assay, and inadequate number of viral copies (<250 copies / mL). A negative result must be combined with clinical observations, patient history, and epidemiological information.  Fact Sheet for Patients:   StrictlyIdeas.no  Fact Sheet for Healthcare Providers: BankingDealers.co.za  This test is not yet approved or  cleared  by the [place] and has been authorized for detection and/or diagnosis of SARS-CoV-2 by FDA under an Emergency Use Authorization (EUA).  This EUA will remain in effect (meaning this test can be used) for the duration of the COVID-19 declaration under Section 564(b)(1) of the Act, 21 U.S.C. section 360bbb-3(b)(1), unless the authorization is terminated or revoked sooner.  Performed at [facility], [place] [address] [place], [place] [address]   MRSA PCR Screening     Status: None   Collection Time: 12/30/19 12:13 AM   Specimen: Nasopharyngeal  Result Value Ref Range Status   MRSA by PCR NEGATIVE NEGATIVE Final    Comment:        The GeneXpert MRSA Assay (FDA approved for NASAL specimens only), is one component of a comprehensive MRSA colonization surveillance program. It is not intended to diagnose MRSA infection nor to guide or monitor treatment for MRSA infections. Performed at [facility], [place] [address] [place], [place] [address]        Radiology Studies: DG Abd Portable 1V  Result Date: 01/07/2020 CLINICAL DATA:  History of cholecystectomy. History of cystoscopy and stent placement. EXAM: PORTABLE ABDOMEN - 1 VIEW COMPARISON:  01/04/2020. FINDINGS: Hemidiaphragms not imaged. Feeding tube noted with tip over the stomach. Distended loops of small and large bowel again noted without interim change. Findings consistent with adynamic ileus. Oral contrast in the right colon. IMPRESSION: 1.  Feeding tube noted with tip over the stomach. 2. Distended loops of small large bowel again noted without interim change. Findings consistent adynamic ileus. Continued follow-up exam suggested to demonstrate resolution and to exclude bowel obstruction. Electronically Signed   By: Marcello Moores  Register   On: 01/07/2020 07:13      Scheduled Meds: .  stroke: mapping our early stages of recovery book   Does not apply Once  . aspirin  81 mg Oral Daily   Or  . aspirin  81 mg Per Tube Daily  . chlorhexidine  15 mL Mouth Rinse BID  . Chlorhexidine Gluconate Cloth  6 each Topical Daily  . heparin injection (subcutaneous)  5,000 Units Subcutaneous Q8H  . mouth rinse  15 mL Mouth Rinse q12n4p  . ticagrelor  90 mg Oral BID   Or  . ticagrelor  90 mg Per Tube BID   Continuous Infusions: . 0.9 % NaCl with KCl 20 mEq / L 70 mL/hr at 01/07/20 0427     Assessment/Plan:  1.  Adynamic ileus: In the setting of acute stroke and postsurgical state.  Patient currently has Cortrak for tube feeds, repeat abdominal x-ray this morning not much change from 7/3. Hold tube feeds, diet.  Continue n.p.o., IV fluids with KCl. Replace potassium further to keep level close to 4 (potassium was significantly low at 2.9 yesterday).  Will discontinue current feeding tube and replaced with NG tube to intermittent suction given significant abdominal distention.  2.  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  S/p TPA  on 6/27, thrombectomy and right MCA stent.  Now on aspirin and Brilinta (may need to clamp tube while receiving oral meds).  Plan to transition to [place] along with anticoagulation on 7/8 per neurology.  On tube feeds for dysphagia as a result of stroke.  Speech did clear patient for dysphagia 1 and nectar thick liquids but hold feeds/oral intake given ileus and abdominal distention. Defer further management to primary service  3.  Right groin hematoma: Patient felt to have pseudoaneurysm with extravasation following IR.  CT abdomen/pelvis also showed right inguinal hematoma into abdominal wall.  Ultrasonogram right groin x2 however did not reveal any pseudoaneurysm.  Patient received 1 unit PRBC and hemoglobin now improved to 10.4 (from 7.3)  4.  Bilateral renal calculi, hematuria: Patient underwent bilateral ureteral stent exchange on June 4.  Urology consulted in this admission and ureteral stents removed on 6/30.  No evidence of hematuria now.  Did receive 1 unit PRBC for anemia during the hospital course as discussed above.  Abdominal CT did confirm mild bilateral hydronephrosis with stents intact.  Creatinine stable around 1.4.  Of note also has history of urinary incontinence on mirabegron.  5.  Left leg swelling: X-ray of the left knee showed severe osteoarthritis, left hip x-ray was negative.  Venous Doppler ruled out DVT.  Avoid fluid overload.  6.  Hypertension, hyperlipidemia: Continue Coreg and Lipitor started in this admission.  7.  CKD stage IIIb: Baseline creatinine appears to be around 1.3-1.4.  S/p bilateral ureteral stents recently, now removed as explained above.  Continue to monitor given GI losses.  Avoid nephrotoxins.  Currently on potassium containing IV fluids, monitor renal function and potassium level.  DVT prophylaxis: Heparin Disposition Plan: Per primary service  Discussed above management and plan with Dr. Leonie Man  Time spent: 35 minutes    >50% time spent in  discussions with care team and coordination of care.    Guilford Shi, MD [facility] Pager in [place]  If 7PM-7AM, please contact night-coverage [email or web address] 01/07/2020, 8:44 AM

## 2020-01-08 ENCOUNTER — Inpatient Hospital Stay (HOSPITAL_COMMUNITY): Payer: PPO

## 2020-01-08 DIAGNOSIS — I69391 Dysphagia following cerebral infarction: Secondary | ICD-10-CM

## 2020-01-08 DIAGNOSIS — Z515 Encounter for palliative care: Secondary | ICD-10-CM

## 2020-01-08 DIAGNOSIS — Z66 Do not resuscitate: Secondary | ICD-10-CM

## 2020-01-08 DIAGNOSIS — S301XXD Contusion of abdominal wall, subsequent encounter: Secondary | ICD-10-CM

## 2020-01-08 DIAGNOSIS — Z7189 Other specified counseling: Secondary | ICD-10-CM

## 2020-01-08 LAB — GLUCOSE, CAPILLARY
Glucose-Capillary: 102 mg/dL — ABNORMAL HIGH (ref 70–99)
Glucose-Capillary: 77 mg/dL (ref 70–99)
Glucose-Capillary: 81 mg/dL (ref 70–99)
Glucose-Capillary: 86 mg/dL (ref 70–99)
Glucose-Capillary: 88 mg/dL (ref 70–99)
Glucose-Capillary: 91 mg/dL (ref 70–99)

## 2020-01-08 LAB — BRAIN NATRIURETIC PEPTIDE: B Natriuretic Peptide: 465.9 pg/mL — ABNORMAL HIGH (ref 0.0–100.0)

## 2020-01-08 LAB — BASIC METABOLIC PANEL
Anion gap: 12 (ref 5–15)
BUN: 39 mg/dL — ABNORMAL HIGH (ref 8–23)
CO2: 21 mmol/L — ABNORMAL LOW (ref 22–32)
Calcium: 8.2 mg/dL — ABNORMAL LOW (ref 8.9–10.3)
Chloride: 113 mmol/L — ABNORMAL HIGH (ref 98–111)
Creatinine, Ser: 1.41 mg/dL — ABNORMAL HIGH (ref 0.44–1.00)
GFR calc Af Amer: 42 mL/min — ABNORMAL LOW (ref >60)
GFR calc non Af Amer: 37 mL/min — ABNORMAL LOW (ref >60)
Glucose, Bld: 90 mg/dL (ref 70–99)
Potassium: 2.7 mmol/L — CL (ref 3.5–5.1)
Sodium: 146 mmol/L — ABNORMAL HIGH (ref 135–145)

## 2020-01-08 MED ORDER — IPRATROPIUM-ALBUTEROL 0.5-2.5 (3) MG/3ML IN SOLN: 3.0000 mL | Freq: Two times a day (BID) | RESPIRATORY_TRACT | Status: DC

## 2020-01-08 MED ORDER — POTASSIUM CHLORIDE 10 MEQ/100ML IV SOLN
10.0000 meq | Freq: Once | INTRAVENOUS | Status: AC
  Administered 2020-01-08: 10 meq via INTRAVENOUS
  Filled 2020-01-08: qty 100

## 2020-01-08 MED ORDER — IPRATROPIUM-ALBUTEROL 0.5-2.5 (3) MG/3ML IN SOLN
3.0000 mL | Freq: Three times a day (TID) | RESPIRATORY_TRACT | Status: DC
  Administered 2020-01-08 (×2): 3 mL via RESPIRATORY_TRACT
  Filled 2020-01-08 (×2): qty 3

## 2020-01-08 MED ORDER — IPRATROPIUM-ALBUTEROL 0.5-2.5 (3) MG/3ML IN SOLN: 3.0000 mL | RESPIRATORY_TRACT | Status: DC | PRN

## 2020-01-08 MED ORDER — DEXTROSE-NACL 5-0.45 % IV SOLN: INTRAVENOUS | Status: DC

## 2020-01-08 MED ORDER — POTASSIUM CHLORIDE IN NACL 40-0.9 MEQ/L-% IV SOLN
INTRAVENOUS | Status: DC
  Filled 2020-01-08: qty 1000

## 2020-01-08 MED ORDER — POTASSIUM CHLORIDE 10 MEQ/100ML IV SOLN
10.0000 meq | INTRAVENOUS | Status: AC
  Administered 2020-01-08 (×6): 10 meq via INTRAVENOUS
  Filled 2020-01-08 (×6): qty 100

## 2020-01-08 MED ORDER — BUDESONIDE 0.5 MG/2ML IN SUSP
0.5000 mg | Freq: Two times a day (BID) | RESPIRATORY_TRACT | Status: DC
  Administered 2020-01-08: 0.5 mg via RESPIRATORY_TRACT
  Filled 2020-01-08: qty 2

## 2020-01-08 NOTE — Progress Notes (Signed)
PROGRESS NOTE    Gloria [name]  EXH:[id number] DOB: [birth year] DOA: 12/29/2019 PCP: Maryella Shivers, MD   Brief Narrative:  74 y.o.femalewith history of bilateral ureteral calculi, HTN, Atrial Fibrillation not on anticoagulation (not on AC due to high risk of falls), who presents with left sided hemiplegia and right gaze preference. CTH in the ED revealed left frontal meningioma, CTA with a right M1 occlusion. She received IVTPA on 12/29/19 and was taken for mechanical thrombectomy as well as revascularization w/placement of recue stent in the right MCA.  MRI revealed right occipital infarct, right insular and frontal operculum infarct, small scattered right frontal and parietal infarcts.  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  Patient was intubated for IR procedure, airway protection and extubated on 6/29..  Following IR procedure patient also developed hematoma in the right groin due to pseudoaneurysm with extravasation.  CT abdomen and pelvis showed hematoma extension into abdominal wall.  Patient received 1 unit of PRBC for hemoglobin 7.3.  Ultrasonogram of the right groin however did not reveal any pseudoaneurysm and hemoglobin been stable around 10.  Now on aspirin and Brilinta given stent placement with plans to discharge on anticoagulation/Brilinta in 5 to 7 days. Hospital course complicated by abdominal distention associated with nausea, vomiting over the weekend although patient been having BMs.  KUB on 7/3 showed dilated small bowel loops consistent with ileus.  Patient been on cortrak for tube feeds given dysphagia as a resultant of stroke.  Speech has cleared for dysphagia 1 with nectar thick.  [facility] service consulted on 7/5 due to persistent symptoms for further evaluation and management.    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  BNP slightly elevated at 500, chest x-ray shows bronchitic changes. Order as needed as needed bronchodilators, incentive spirometer, Pulmicort twice daily Hydration with caution, if necessary will give IV Lasix.  Adynamic ileus In the setting of CVA/postsurgical.  Recommend out of bed to chair.  Maintain NG tube to suction.  Aggressively replete electrolytes.  Embolic CVA In the setting of A. fib not on anticoagulation.  Status post TPA 6/27, thrombectomy and right MCA stent.  Now on aspirin and Brilinta with eventual plans to transition to Brilinta with anticoagulation. Dysphagia 1 diet Care per neurology team  Right groin hematoma Status post 1 unit PRBC.  Hemoglobin currently stable. CT abdomen pelvis showed right inguinal hematoma but no pseudoaneurysm seen on ultrasound  Bilateral renal calculi, hematuria Status post stent exchange t by urology 6/4, stent removed 6/30 by urology   Left leg swelling X-ray of the left knee showed severe osteoarthritis, left hip x-ray was negative.  Venous Doppler ruled out DVT.  Avoid fluid overload.  Hypertension, hyperlipidemia  Continue Coreg and Lipitor started in this admission.  CKD stage IIIb  Baseline creatinine appears to be around 1.3-1.4.  S/p bilateral ureteral stents recently, now removed as explained above.  Continue to monitor  given GI losses.  Avoid nephrotoxins.  Currently on potassium containing IV fluids, monitor renal function and potassium level.  DVT prophylaxis: Subcu heparin Code Status: DNR Family Communication:  Lowella Fairy. No answer- left a voicemail.   Status is: Inpatient    Body mass index is 38.63 kg/m.     Subjective: Laying in the bed, tells me she is passing some gas. Appears slightly short of breath but no complaints.   Review of Systems Otherwise negative except as per HPI, including: General: Denies fever, chills, night sweats or unintended weight loss. Resp: Denies cough, wheezing Cardiac: Denies chest pain, palpitations, orthopnea, paroxysmal nocturnal dyspnea. GI: Denies abdominal pain, nausea, vomiting, diarrhea or constipation GU: Denies dysuria, frequency, hesitancy or incontinence MS: Denies muscle aches, joint pain or swelling Neuro: Denies headache, neurologic deficits (focal weakness, numbness, tingling), abnormal gait Psych: Denies anxiety, depression, SI/HI/AVH Skin: Denies new rashes or lesions ID: Denies sick contacts, exotic exposures, travel  Examination:  Constitutional: Not in acute distress; appears chronically ill.  Respiratory: b'/ mild wheezing especially at the bases.  Cardiovascular: Normal sinus rhythm, no rubs Abdomen: Nontender nondistended good bowel sounds Musculoskeletal: No edema noted Skin: No rashes seen Neurologic: CN 2-12 grossly intact.  And nonfocal Psychiatric: Poor judgment and insight. Alert and oriented x 3. Normal mood.   NGT In place  Objective: Vitals:   01/08/20 0000 01/08/20 0343 01/08/20 0810 01/08/20 1153  BP: 135/75 127/68 (!) 149/71 (!) 145/64  Pulse: 65 70 73 64  Resp:  18 18 18   Temp: 98.4 F (36.9 C) 98.1 F (36.7 C) 98.4 F (36.9 C) 98.5 F (36.9 C)  TempSrc:  Oral Oral Axillary  SpO2: 97% 96% 96% 96%  Weight:        Intake/Output Summary (Last 24 hours) at 01/08/2020 1404 Last data filed at 01/08/2020  7253 Gross per 24 hour  Intake 1653.13 ml  Output 130 ml  Net 1523.13 ml   Filed Weights   01/04/20 0422 01/05/20 0315 01/06/20 0337  Weight: 99.2 kg 96.5 kg 95.8 kg     Data Reviewed:   CBC: Recent Labs  Lab 01/01/20 1446 01/02/20 0535 01/03/20 0442 01/06/20 0719  WBC  --  5.1 5.2 7.5  HGB 8.7* 8.6* 9.3* 10.4*  HCT 27.3* 27.2* 29.4* 32.7*  MCV  --  103.0* 103.2* 104.5*  PLT  --  175 212 664   Basic Metabolic Panel: Recent Labs  Lab 01/02/20 0535 01/03/20 0442 01/06/20 0719 01/07/20 0447 01/08/20 0414  NA 141 140 139 143 146*  K 4.2 4.4 2.9* 3.4* 2.7*  CL 108 108 108 111 113*  CO2 25 23 20* 21* 21*  GLUCOSE 119* 126* 131* 102* 90  BUN 31* 31* 40* 42* 39*  CREATININE 1.49* 1.32* 1.49* 1.45* 1.41*  CALCIUM 8.1* 8.4* 8.3* 8.2* 8.2*  MG  --   --  2.1  --   --   PHOS  --   --  4.2  --   --    GFR: Estimated Creatinine Clearance: 37.8 mL/min (A) (by C-G formula based on SCr of 1.41 mg/dL (H)). Liver Function Tests: Recent Labs  Lab 01/06/20 0719  AST 48*  ALT 26  ALKPHOS 71  BILITOT 0.9  PROT 5.7*  ALBUMIN 2.6*   No results  for input(s): LIPASE, AMYLASE in the last 168 hours. No results for input(s): AMMONIA in the last 168 hours. Coagulation Profile: No results for input(s): INR, PROTIME in the last 168 hours. Cardiac Enzymes: No results for input(s): CKTOTAL, CKMB, CKMBINDEX, TROPONINI in the last 168 hours. BNP (last 3 results) No results for input(s): PROBNP in the last 8760 hours. HbA1C: No results for input(s): HGBA1C in the last 72 hours. CBG: Recent Labs  Lab 01/07/20 1958 01/07/20 2320 01/08/20 0337 01/08/20 0843 01/08/20 1253  GLUCAP 94 88 91 88 77   Lipid Profile: No results for input(s): CHOL, HDL, LDLCALC, TRIG, CHOLHDL, LDLDIRECT in the last 72 hours. Thyroid Function Tests: Recent Labs    01/07/20 0447  TSH 4.769*   Anemia Panel: No results for input(s): VITAMINB12, FOLATE, FERRITIN, TIBC, IRON, RETICCTPCT in the last 72  hours. Sepsis Labs: No results for input(s): PROCALCITON, LATICACIDVEN in the last 168 hours.  Recent Results (from the past 240 hour(s))  SARS Coronavirus 2 by RT PCR (hospital order, performed in [facility] hospital lab) Nasopharyngeal Nasopharyngeal Swab     Status: None   Collection Time: 12/29/19  7:02 PM   Specimen: Nasopharyngeal Swab  Result Value Ref Range Status   SARS Coronavirus 2 NEGATIVE NEGATIVE Final    Comment: (NOTE) SARS-CoV-2 target nucleic acids are NOT DETECTED.  The SARS-CoV-2 RNA is generally detectable in upper and lower respiratory specimens during the acute phase of infection. The lowest concentration of SARS-CoV-2 viral copies this assay can detect is 250 copies / mL. A negative result does not preclude SARS-CoV-2 infection and should not be used as the sole basis for treatment or other patient management decisions.  A negative result may occur with improper specimen collection / handling, submission of specimen other than nasopharyngeal swab, presence of viral mutation(s) within the areas targeted by this assay, and inadequate number of viral copies (<250 copies / mL). A negative result must be combined with clinical observations, patient history, and epidemiological information.  Fact Sheet for Patients:   StrictlyIdeas.no  Fact Sheet for Healthcare Providers: BankingDealers.co.za  This test is not yet approved or  cleared by the [place] FDA and has been authorized for detection and/or diagnosis of SARS-CoV-2 by FDA under an Emergency Use Authorization (EUA).  This EUA will remain in effect (meaning this test can be used) for the duration of the COVID-19 declaration under Section 564(b)(1) of the Act, 21 U.S.C. section 360bbb-3(b)(1), unless the authorization is terminated or revoked sooner.  Performed at [facility], [place] [address] [place], [place] [address]   MRSA PCR Screening      Status: None   Collection Time: 12/30/19 12:13 AM   Specimen: Nasopharyngeal  Result Value Ref Range Status   MRSA by PCR NEGATIVE NEGATIVE Final    Comment:        The GeneXpert MRSA Assay (FDA approved for NASAL specimens only), is one component of a comprehensive MRSA colonization surveillance program. It is not intended to diagnose MRSA infection nor to guide or monitor treatment for MRSA infections. Performed at [facility], [place] [address] [place], [place] [address]          Radiology Studies: DG Abd 1 View  Result Date: 01/07/2020 CLINICAL DATA:  Nasogastric tube placement. EXAM: ABDOMEN - 1 VIEW COMPARISON:  January 07, 2020 (6:50 a.m.) FINDINGS: The feeding tube seen on the prior study has been removed and has been replaced with a nasogastric tube. Its distal  tip is seen overlying the expected region of the body of the stomach. Multiple distended loops of small and large bowel are again seen without interval change in caliber when compared to the prior study. No radio-opaque calculi or other significant radiographic abnormality are seen. IMPRESSION: Nasogastric tube positioning, as described above. Electronically Signed   By: Virgina Norfolk M.D.   On: 01/07/2020 15:17   DG ABD ACUTE 2+V W 1V CHEST  Result Date: 01/08/2020 CLINICAL DATA:  Follow-up ileus.  Diarrhea. EXAM: DG ABDOMEN ACUTE W/ 1V CHEST COMPARISON:  Abdominal x-rays 01/07/2020 and earlier, including CT abdomen and pelvis 12/29/2019. No prior chest x-ray. FINDINGS: Moderate gaseous distension of the ascending and transverse colon, not significantly changed since yesterday. The gaseous distension of the small bowel has improved. No evidence of free intraperitoneal air on the [employer]. Nasogastric tube tip projects over the expected location of the mid stomach, unchanged. Contrast material in sigmoid colon diverticula is again noted. Cardiac silhouette mildly to moderately enlarged for AP technique. Moderate  central peribronchial thickening. Lungs otherwise clear. Mild pulmonary venous hypertension without overt edema. IMPRESSION: 1. Stable moderate colonic ileus. Improved small bowel ileus. No free intraperitoneal air. 2. Stable cardiomegaly. Mild pulmonary venous hypertension without overt edema. Moderate changes of bronchitis and/or asthma without focal airspace pneumonia. Electronically Signed   By: Evangeline Dakin M.D.   On: 01/08/2020 11:14   DG Abd Portable 1V  Result Date: 01/07/2020 CLINICAL DATA:  History of cholecystectomy. History of cystoscopy and stent placement. EXAM: PORTABLE ABDOMEN - 1 VIEW COMPARISON:  01/04/2020. FINDINGS: Hemidiaphragms not imaged. Feeding tube noted with tip over the stomach. Distended loops of small and large bowel again noted without interim change. Findings consistent with adynamic ileus. Oral contrast in the right colon. IMPRESSION: 1.  Feeding tube noted with tip over the stomach. 2. Distended loops of small large bowel again noted without interim change. Findings consistent adynamic ileus. Continued follow-up exam suggested to demonstrate resolution and to exclude bowel obstruction. Electronically Signed   By: Marcello Moores  Register   On: 01/07/2020 07:13        Scheduled Meds: .  stroke: mapping our early stages of recovery book   Does not apply Once  . aspirin  81 mg Oral Daily   Or  . aspirin  81 mg Per Tube Daily  . chlorhexidine  15 mL Mouth Rinse BID  . Chlorhexidine Gluconate Cloth  6 each Topical Daily  . heparin injection (subcutaneous)  5,000 Units Subcutaneous Q8H  . mouth rinse  15 mL Mouth Rinse q12n4p  . ticagrelor  90 mg Oral BID   Or  . ticagrelor  90 mg Per Tube BID   Continuous Infusions: . dextrose 5 % and 0.45% NaCl 75 mL/hr at 01/08/20 1021  . potassium chloride 10 mEq (01/08/20 1304)     LOS: 10 days   Time spent= 45 mins    Bitania Shankland Arsenio Loader, MD [facility]  If 7PM-7AM, please contact night-coverage  01/08/2020,  2:04 PM

## 2020-01-08 NOTE — Consult Note (Signed)
Consultation Note Date: 01/08/2020   Patient Name: Gloria [name]  DOB: [birth year]  MRN: [id number]  Age / Sex: 74 y.o., female  PCP: Gloria Shivers, MD Referring Physician: Garvin Fila, MD  Reason for Consultation: Establishing goals of care  HPI/Patient Profile: 74 y.o. female  with past medical history of HTN, a fib (not on AC d/t high risk of falls), severe OA, and CKD admitted on 12/29/2019 with L sided hemiplegia and R gaze preference. CT revealed L frontal meningioma and R M1 occlusion. She received IV TPA on 6/27 and underwent mechanical thrombectomy and stent placement.  MRI revealed right occipital infarct, right insular and frontal operculum infarct, small scattered right frontal and parietal infarcts. Patient is being treated for embolic CVA d/t a fib. Patient was started on dysphagia 1 diet but that has been stopped d/t ileus. Now has NG for decompression. PMT consulted for [place].  Clinical Assessment and Goals of Care: I have reviewed medical records including EPIC notes, labs and imaging, received report from RN, assessed the patient and then spoke with patient's brother and Gloria [name],  to discuss diagnosis prognosis, GOC, EOL wishes, disposition and options.  I introduced Palliative Medicine as specialized medical care for people living with serious illness. It focuses on providing relief from the symptoms and stress of a serious illness. The goal is to improve quality of life for both the patient and the family.  We discussed a brief life review of the patient. Gloria [name] tells me Gloria [name] has lived next door to him for over 20 years. Her spouse passed away about 5 years ago.   As far as functional and nutritional status, Gloria [name] tells me the patient has been declining for about 5 years - she is very weak. She ambulates around the house with 2 canes but does not ambulate well. She has severe OA that impairs mobility. Gloria [name] tells me of  difficulties with Gloria [name] completing ADLs independently - took 1.5 hour to bathe herself using sink - could not use tub/shower. Gloria [name] also shares about some loss of appetite and weight loss. Patient was incontinent and this was a significant burden to her - stopped taking lasix d/t incontinence. Also would go a day or two without eating when she knew she had to leave the house for fear of incontinent episode. Gloria [name] shares of mild cognitive decline/confusion recently.    We discussed patient's current illness and what it means in the larger context of patient's on-going co-morbidities.  Natural disease trajectory and expectations at EOL were discussed. Gloria [name] has good understanding of current situation. Specifically discussed Gloria [name]'s stroke, the effects on her body, difficulty with nutrition, poor functional status baseline, and ileus.   I attempted to elicit values and goals of care important to the patient.  Gloria [name] shares that Gloria [name] greatly values her independence. She has always told him she would not want to be placed in a facility. She has also told him she would not want to be on life support. We discuss [name]'s advance directive - Gloria [name] has made it clear she would not want life artificially prolonged  The difference between aggressive medical intervention and comfort care was considered in light of the patient's goals of care. We discuss consideration of comfort focused care if Gloria [name] does not improve or declines.   We discuss code status - [name] has made it clear in the past she would not want resuscitation efforts - Gloria [name] agrees.   Discussed with Gloria [name] the importance of continued conversation with  family and the medical providers regarding overall plan of care and treatment options, ensuring decisions are within the context of the patient's values and GOCs.    We discussed disposition options moving forward - Gloria [name] acknowledges that CIR may be very difficult for patient to tolerate. Will  consider SNF vs hospice depending on how patient does in coming days.   Questions and concerns were addressed. The family was encouraged to call with questions or concerns.   Primary Decision Maker HCPOA/brother - Gloria [name]    SUMMARY OF RECOMMENDATIONS   - code status changed to DNR - continue current measures - PMT will follow along  - consider SNF vs hospice depending on hospital course  Code Status/Advance Care Planning:  DNR  Discharge Planning: To Be Determined      Primary Diagnoses: Present on Admission: . 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 Middle cerebral artery embolism, right . Atrial fibrillation ([place]) . Hematuria . Accelerated hypertension . Hyperlipidemia . CKD (chronic kidney disease), stage IIIa . Obesity (BMI 30-39.9) . Urinary incontinence   I have reviewed the medical record, interviewed the patient and family, and examined the patient. The following aspects are pertinent.  Past Medical History:  Diagnosis Date  . Arthritis    knees  . Atrial fibrillation ([place])   . Dysrhythmia   . History of kidney stones   . HOH (hard of hearing)   . Hypertension    Social History   Socioeconomic History  . Marital status: Widowed    Spouse name: Not on file  . Number of children: Not on file  . Years of education: Not on file  . Highest education level: Not on file  Occupational History  . Not on file  Tobacco Use  . Smoking status: Never Smoker  . Smokeless tobacco: Never Used  Vaping Use  . Vaping Use: Never used  Substance and Sexual Activity  . Alcohol use: Never  . Drug use: Never  . Sexual activity: Not on file  Other Topics Concern  . Not on file  Social History Narrative  . Not on file   Social Determinants of Health   Financial Resource Strain:   . Difficulty of Paying Living Expenses:   Food Insecurity:   . Worried About  Charity fundraiser in the Last Year:   . Arboriculturist in the Last Year:   Transportation Needs:   . Film/video editor (Medical):   Marland Kitchen Lack of Transportation (Non-Medical):   Physical Activity:   . Days of Exercise per Week:   . Minutes of Exercise per Session:   Stress:   . Feeling of Stress :   Social Connections:   . Frequency of Communication with Friends and Family:   . Frequency of Social Gatherings with Friends and Family:   . Attends Religious Services:   . Active Member of Clubs or Organizations:   . Attends Archivist Meetings:   Marland Kitchen Marital Status:    History reviewed. No pertinent family history. Scheduled Meds: .  stroke: mapping our early stages of recovery book   Does not apply Once  . aspirin  81 mg Oral Daily   Or  . aspirin  81 mg Per Tube Daily  . budesonide (PULMICORT) nebulizer solution  0.5 mg Nebulization BID  . chlorhexidine  15 mL Mouth Rinse BID  .  Chlorhexidine Gluconate Cloth  6 each Topical Daily  . heparin injection (subcutaneous)  5,000 Units Subcutaneous Q8H  . ipratropium-albuterol  3 mL Nebulization TID  . mouth rinse  15 mL Mouth Rinse q12n4p  . ticagrelor  90 mg Oral BID   Or  . ticagrelor  90 mg Per Tube BID   Continuous Infusions: . dextrose 5 % and 0.45% NaCl 75 mL/hr at 01/08/20 1021  . potassium chloride 10 mEq (01/08/20 1304)   PRN Meds:.acetaminophen **OR** acetaminophen (TYLENOL) oral liquid 160 mg/5 mL **OR** acetaminophen, ipratropium-albuterol, senna-docusate Allergies  Allergen Reactions  . Aspartame And Phenylalanine Other (See Comments)    unknown  . Azithromycin Other (See Comments)    Unknown  . Bee Venom Other (See Comments)    Hyper  . Cortizone-5 [Hydrocortisone] Itching and Swelling  . Olmesartan Other (See Comments)    unknown  . Other Other (See Comments)    IV dye Dyes - unknown reaction   . Penicillins Other (See Comments)    unknown  . Valsartan Other (See Comments)    Unknown  .  Latex Rash and Other (See Comments)    Skin peels   Review of Systems  Unable to perform ROS: Mental status change    Physical Exam Constitutional:      General: She is not in acute distress.    Comments: Does not verbally respond or follow commands, is HOH  Pulmonary:     Effort: Pulmonary effort is normal.  Skin:    General: Skin is warm and dry.  Neurological:     Mental Status: She is alert.     Comments: UTA orientation     Vital Signs: BP (!) 145/64 (BP Location: Right Arm)   Pulse 64   Temp 98.5 F (36.9 C) (Axillary)   Resp 18   Wt 95.8 kg   SpO2 100%   BMI 38.63 kg/m  Pain Scale: PAINAD   Pain Score: 0-No pain   SpO2: SpO2: 100 % O2 Device:SpO2: 100 % O2 Flow Rate: .O2 Flow Rate (L/min): 1 L/min  IO: Intake/output summary:   Intake/Output Summary (Last 24 hours) at 01/08/2020 1444 Last data filed at 01/08/2020 1610 Gross per 24 hour  Intake 1653.13 ml  Output 130 ml  Net 1523.13 ml    LBM: Last BM Date: 01/06/20 Baseline Weight: Weight: 95.7 kg Most recent weight: Weight: 95.8 kg     Palliative Assessment/Data: PPS 20% d/t PO status    Time Total: 70 minutes Greater than 50%  of this time was spent counseling and coordinating care related to the above assessment and plan.  Juel Burrow, DNP, AGNP-C Palliative Medicine Team [phone] Pager: [phone]

## 2020-01-08 NOTE — Progress Notes (Addendum)
STROKE TEAM PROGRESS NOTE   SUBJECTIVE (INTERVAL HISTORY) Patient is lying in bed.  She has  More reduced abdominal distention and ileus.  . She now has NG tube to suction which shows dark bilious secretions.  Her potassium is still low at 2.7.  BMP is elevated.  Vital signs are stable.  No neurological changes.    Neurological exam is unchanged.  KUB today showed improved small bowel ileus with stable moderate colonic ileus.  No free intraperitoneal air.  Mild pulmonary venous hypertension and overt edema. OBJECTIVE Temp:  [98.1 F (36.7 C)-98.5 F (36.9 C)] 98.5 F (36.9 C) (07/07 1153) Pulse Rate:  [64-78] 64 (07/07 1153) Cardiac Rhythm: Atrial fibrillation;Bundle branch block (07/07 0700) Resp:  [18-21] 18 (07/07 1153) BP: (127-153)/(64-91) 145/64 (07/07 1153) SpO2:  [94 %-97 %] 96 % (07/07 1153)  Recent Labs  Lab 01/07/20 1958 01/07/20 2320 01/08/20 0337 01/08/20 0843 01/08/20 1253  GLUCAP 94 88 91 88 77   Recent Labs  Lab 01/02/20 0535 01/02/20 0535 01/03/20 0442 01/03/20 0442 01/06/20 0719 01/07/20 0447 01/08/20 0414  NA 141  --  140  --  139 143 146*  K 4.2  --  4.4  --  2.9* 3.4* 2.7*  CL 108  --  108  --  108 111 113*  CO2 25  --  23  --  20* 21* 21*  GLUCOSE 119*  --  126*  --  131* 102* 90  BUN 31*  --  31*  --  40* 42* 39*  CREATININE 1.49*  --  1.32*  --  1.49* 1.45* 1.41*  CALCIUM 8.1*   < > 8.4*   < > 8.3* 8.2* 8.2*  MG  --   --   --   --  2.1  --   --   PHOS  --   --   --   --  4.2  --   --    < > = values in this interval not displayed.   Recent Labs  Lab 01/06/20 0719  AST 48*  ALT 26  ALKPHOS 71  BILITOT 0.9  PROT 5.7*  ALBUMIN 2.6*   Recent Labs  Lab 01/01/20 1446 01/02/20 0535 01/03/20 0442 01/06/20 0719  WBC  --  5.1 5.2 7.5  HGB 8.7* 8.6* 9.3* 10.4*  HCT 27.3* 27.2* 29.4* 32.7*  MCV  --  103.0* 103.2* 104.5*  PLT  --  175 212 339    IMAGING DG Abd 1 View  Result Date: 01/07/2020 CLINICAL DATA:  Nasogastric tube placement.  EXAM: ABDOMEN - 1 VIEW COMPARISON:  January 07, 2020 (6:50 a.m.) FINDINGS: The feeding tube seen on the prior study has been removed and has been replaced with a nasogastric tube. Its distal tip is seen overlying the expected region of the body of the stomach. Multiple distended loops of small and large bowel are again seen without interval change in caliber when compared to the prior study. No radio-opaque calculi or other significant radiographic abnormality are seen. IMPRESSION: Nasogastric tube positioning, as described above. Electronically Signed   By: Virgina Norfolk M.D.   On: 01/07/2020 15:17   DG ABD ACUTE 2+V W 1V CHEST  Result Date: 01/08/2020 CLINICAL DATA:  Follow-up ileus.  Diarrhea. EXAM: DG ABDOMEN ACUTE W/ 1V CHEST COMPARISON:  Abdominal x-rays 01/07/2020 and earlier, including CT abdomen and pelvis 12/29/2019. No prior chest x-ray. FINDINGS: Moderate gaseous distension of the ascending and transverse colon, not significantly changed since yesterday. The gaseous  distension of the small bowel has improved. No evidence of free intraperitoneal air on the [employer]. Nasogastric tube tip projects over the expected location of the mid stomach, unchanged. Contrast material in sigmoid colon diverticula is again noted. Cardiac silhouette mildly to moderately enlarged for AP technique. Moderate central peribronchial thickening. Lungs otherwise clear. Mild pulmonary venous hypertension without overt edema. IMPRESSION: 1. Stable moderate colonic ileus. Improved small bowel ileus. No free intraperitoneal air. 2. Stable cardiomegaly. Mild pulmonary venous hypertension without overt edema. Moderate changes of bronchitis and/or asthma without focal airspace pneumonia. Electronically Signed   By: Evangeline Dakin M.D.   On: 01/08/2020 11:14     PHYSICAL EXAM  Elderly Caucasian lady seems to be in mild distress due to abdominal distention. . Afebrile. Head is nontraumatic. Neck is supple without bruit.     Cardiac exam no murmur or gallop. Lungs are clear to auscultation. Distal pulses are well felt.  She is extremely hard of hearing.  Abdomen greatly distended.  Bowel sounds very diminished.  Slight tenderness over abdomen diffusely Neurological Exam :  Patient is deaf and difficult to communicate with.  She is lying in bed.  She has right gaze preference and head deviation.  She follows simple commands.  Speech is slow but clear.  Right gaze deviation.  Does not blink to threat on the left but does so on the right.  Left lower facial weakness.  Tongue midline.  Left upper extremity is flaccid and weak.  Withdraws left lower extremity to pain.  Purposeful antigravity movements on the right side. ASSESSMENT/PLAN Gloria [name] is a 74 y.o. female with history of bilateral ureteral calculi, HTN, Atrial Fibrillation not on anticoagulation (not on [facility] due to high risk of falls), who presents with left sided hemiplegia and right gaze preference. CTH in the ED revealed left frontal meningioma, CTA with a right M1 occlusion. She received IVTPA on 12/29/19 at 1830 w/LKW 1645 and was taken for mechanical thrombectomy with resulting TICI 2B revascularization w/placement of recue stent in the right MCA.  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 Small vessel disease. Atrophy. ASPECTS 10.     CTA head & neck R M1 occlusion, fetal PCA on the right  Cerebral angio R M1 occlusion w/p TICI 2b revascularization. 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    Post IR CT hyperdensity R subarachnoid space, stable L parafalcine meningioma  MRI  R occipital infarct. R insular and frontal operculum infarct. Small scattered R frontal and parietal infarcts.  MRA  fetal R PCA. Missing superior division R MCA. R MCA stent, inferior division with  flow.  LE Doppler  No DVT  2D Echo EF > 75%, severe LVH. Mild to moderate TVR.  LDL 90  HgbA1c 5.2  Heparin subq for VTE prophylaxis  aspirin 325 mg daily prior to admission, now on aspirin 81 mg daily and Brilinta (ticagrelor) 90 mg bid given stent placement. Plan to continue Brilinta at d/c with [facility] in 5-7 days (around 01/09/20 if anemia stable).   Therapy recommendations:  CIR  Disposition:  pending   Ileus/SBO  Extremely distended Abd  KUB 7/3 w/ dilated small bowel loop c/w ileus  Stop TF and po intake except aspirin and brilinta  Pt w/ 2  BMs this am, 1 loose and 1 soft. Had BMs over the weekend  Nausea w/ vomiting this am  IM consulted. Likely needs decompression  Acute Respiratory Failure, resolved  intubated for IR, left intubated for airway protection  Extubation 6/29  Tolerated well  Hematoma R groin  pseudoanerysm w/ extravasation following IR  CT abd & pelvis R inguinal hematoma into abdominal wall. Stable B ureteral stents and B adrenal adenoma. Aortic atherosclerosis.    S/p 1u PRBC for Hgb 7.3  [place] right groin x 2 - no pseudoaneurysm   Hgb now 10.4  Hematuria, resolved  S/p B ureteral stents exchange 6/4 d/t B renal calculi  ABD CT Mild B hydronephrosis, stents in place  Hgb 9.2->8.8->8.4->7.8->7.3 - 1u PRBC - 8.7->8.6->9.3->10.4  Urology consulted, appreciate help  Ureteral stents removed by urology 6/30 followed by 1 dose cipro  No gross hematuria now  ongoing Cre monitor, if elevated, consider [place] abd to rule out hydro  Cre 1.49  Atrial Fibrillation  Home anticoagulation:  none d/t high fall risk  Not timing for [facility] at this time  Plan with [facility] in 5-7 days (around 01/09/20) if anemia stable   Left leg swelling  LE venous doppler no DVT  XR of left knee severe OA and hip neg  On heparin subq for DVT prophylaxis   Continue SCDs  Hypertension  Home meds:  Coreg 6.25 bid  Stable 110-140  SBP goal now <  180  Long-term BP goal normotensive  Hyperlipidemia  Home meds:  No statin  LDL 90, goal < 70  lipitor 40 now  Continue statin at discharge  Dysphagia  Secondary to stroke  Cleared for dys1 with nectar thick, poor intake  Has Cortrak   On TF @  25 to facilitate oral intake, now on hold  Speech on board  Increased IVF to NS w/ 20K at 70h   CKD stage IIIab  Cre 1.49  Could be due to contrast nephrotoxicity  Increased IVF   Continue monitoring BMP  Other Stroke Risk Factors  Advanced age  Obesity, Body mass index is 38.59 kg/m., recommend weight loss, diet and exercise as appropriate   Other Active Problems  Urinary incontinence on mirabegron PTA  HOH  Transient bradycardia w/ HR to 33 7/5  hypokalemia 2.9 - supplement - recheck in am  Hospital day # 10   Patient neurologically remains unchanged and abdominal distention is slightly improving.  She continues to have hypokalemia.  Plan replace potassium.  Continue NG tube to suction for decompression of abdominal distention.  Appreciate medical hospitalist team help.  Palliative care consultation pending.  Greater than 50% time during this 25-minute visit was spent on counseling and coordination of care about her abdominal distention and stroke and discussion with care team. Antony Contras, MD     To contact Stroke Continuity provider, please refer to [email or web address] After hours, contact General Neurology

## 2020-01-08 NOTE — TOC Benefit Eligibility Note (Signed)
Transition of Care [facility]) Benefit Eligibility Note    Patient Details  Name: Gloria [name] MRN: [id number] Date of Birth: [birth year]   Medication/Dose: BRILINTA  90 MG BID  Covered?: Yes  Tier: 3 Drug  Prescription Coverage Preferred Pharmacy: [facility]  Spoke with Person/Company/Phone Number:: [place]  @ [employer] # [phone] OPT- 2  Co-Pay: $ 45.00  Prior Approval: No  Deductible:  (NO DEDUCTIBLE WITH PLAN)  Additional Notes: 90 DAY SUPPLY FOR  RETAIL $ 90.00    Memory Argue Phone Number: 01/08/2020, 11:38 AM

## 2020-01-08 NOTE — Progress Notes (Signed)
OT Cancellation Note  Patient Details Name: Gloria [name] MRN: [id number] DOB: April 11, 1946   Cancelled Treatment:    Reason Eval/Treat Not Completed: Pain limiting ability to participate;Medical issues which prohibited therapy;Other (comment) . Pt remains to have constant liquid stool with distended abdomen. Pt unable to tolerate OT session per chart review at this time.  Will follow up as time allows for OT session.  Lanier Clam., COTA/L Acute Rehabilitation Services [phone] [place] 01/08/2020, 3:41 PM

## 2020-01-08 NOTE — Progress Notes (Signed)
SLP Cancellation Note  Patient Details Name: Gloria [name] MRN: [id number] DOB: September 02, 1945   Cancelled treatment:       Reason Eval/Treat Not Completed: Pain limiting ability to participate;Fatigue/lethargy limiting ability to participate;Medical issues which prohibited therapy  Per RN palliative meeting is planned for today. Pt remains to have constant liquid stool with severely distended abdomen; generally not feeling well and not wishing to participate in therapy. ST to await plan of care s/p conclusion of palliative meeting.   Kellen Hover P. Shavona Gunderman, M.S., [place] Pathologist Acute Rehabilitation Services Pager: [place] 01/08/2020, 11:29 AM

## 2020-01-08 NOTE — Progress Notes (Signed)
PT Cancellation Note  Patient Details Name: Gloria [name] MRN: [id number] DOB: June 07, 1946   Cancelled Treatment:    Reason Eval/Treat Not Completed: Other (comment). Per RN palliative meeting is planned for today. Pt remains to have constant liquid stool with severely distended abdomen and unable to tolerate any mobility at this time. Acute PT to await plan of care s/p conclusion of palliative meeting. PT to return as able as appropriate to progress mobility if requested by family.  Kittie Plater, PT, DPT Acute Rehabilitation Services Pager #: [phone] Office #: [phone]    [place] Lopes 01/08/2020, 9:59 AM

## 2020-01-09 DIAGNOSIS — R32 Unspecified urinary incontinence: Secondary | ICD-10-CM

## 2020-01-09 DIAGNOSIS — Z515 Encounter for palliative care: Secondary | ICD-10-CM

## 2020-01-09 LAB — VITAMIN B12: Vitamin B-12: 483 pg/mL (ref 180–914)

## 2020-01-09 LAB — CBC
HCT: 29.4 % — ABNORMAL LOW (ref 36.0–46.0)
Hemoglobin: 9.4 g/dL — ABNORMAL LOW (ref 12.0–15.0)
MCH: 34.7 pg — ABNORMAL HIGH (ref 26.0–34.0)
MCHC: 32 g/dL (ref 30.0–36.0)
MCV: 108.5 fL — ABNORMAL HIGH (ref 80.0–100.0)
Platelets: 343 10*3/uL (ref 150–400)
RBC: 2.71 MIL/uL — ABNORMAL LOW (ref 3.87–5.11)
RDW: 16.7 % — ABNORMAL HIGH (ref 11.5–15.5)
WBC: 6.8 10*3/uL (ref 4.0–10.5)
nRBC: 0.3 % — ABNORMAL HIGH (ref 0.0–0.2)

## 2020-01-09 LAB — BASIC METABOLIC PANEL
Anion gap: 11 (ref 5–15)
BUN: 31 mg/dL — ABNORMAL HIGH (ref 8–23)
CO2: 19 mmol/L — ABNORMAL LOW (ref 22–32)
Calcium: 8.3 mg/dL — ABNORMAL LOW (ref 8.9–10.3)
Chloride: 117 mmol/L — ABNORMAL HIGH (ref 98–111)
Creatinine, Ser: 1.29 mg/dL — ABNORMAL HIGH (ref 0.44–1.00)
GFR calc Af Amer: 47 mL/min — ABNORMAL LOW (ref >60)
GFR calc non Af Amer: 41 mL/min — ABNORMAL LOW (ref >60)
Glucose, Bld: 110 mg/dL — ABNORMAL HIGH (ref 70–99)
Potassium: 2.4 mmol/L — CL (ref 3.5–5.1)
Sodium: 147 mmol/L — ABNORMAL HIGH (ref 135–145)

## 2020-01-09 LAB — GLUCOSE, CAPILLARY
Glucose-Capillary: 108 mg/dL — ABNORMAL HIGH (ref 70–99)
Glucose-Capillary: 118 mg/dL — ABNORMAL HIGH (ref 70–99)
Glucose-Capillary: 106 mg/dL — ABNORMAL HIGH (ref 70–99)

## 2020-01-09 LAB — MAGNESIUM: Magnesium: 2 mg/dL (ref 1.7–2.4)

## 2020-01-09 MED ORDER — DEXTROSE 5 % IV SOLN: INTRAVENOUS | Status: DC

## 2020-01-09 MED ORDER — GLYCOPYRROLATE 1 MG PO TABS
1.0000 mg | ORAL_TABLET | ORAL | Status: DC | PRN
  Filled 2020-01-09: qty 1

## 2020-01-09 MED ORDER — HALOPERIDOL LACTATE 5 MG/ML IJ SOLN: 0.5000 mg | INTRAMUSCULAR | Status: DC | PRN

## 2020-01-09 MED ORDER — HALOPERIDOL 0.5 MG PO TABS: 0.5000 mg | ORAL_TABLET | ORAL | Status: DC | PRN

## 2020-01-09 MED ORDER — BIOTENE DRY MOUTH MT LIQD: 15.0000 mL | OROMUCOSAL | Status: DC | PRN

## 2020-01-09 MED ORDER — BIOTENE DRY MOUTH MT LIQD: 15.0000 mL | Freq: Two times a day (BID) | OROMUCOSAL | Status: DC

## 2020-01-09 MED ORDER — POTASSIUM CHLORIDE 10 MEQ/100ML IV SOLN
10.0000 meq | INTRAVENOUS | Status: AC
  Administered 2020-01-09 (×6): 10 meq via INTRAVENOUS
  Filled 2020-01-09 (×6): qty 100

## 2020-01-09 MED ORDER — POLYVINYL ALCOHOL 1.4 % OP SOLN
1.0000 [drp] | Freq: Four times a day (QID) | OPHTHALMIC | Status: DC | PRN
  Filled 2020-01-09: qty 15

## 2020-01-09 MED ORDER — MORPHINE SULFATE (PF) 2 MG/ML IV SOLN: 2.0000 mg | INTRAVENOUS | Status: DC | PRN

## 2020-01-09 MED ORDER — HALOPERIDOL LACTATE 2 MG/ML PO CONC
0.5000 mg | ORAL | Status: DC | PRN
  Filled 2020-01-09: qty 0.3

## 2020-01-09 MED ORDER — GLYCOPYRROLATE 0.2 MG/ML IJ SOLN: 0.2000 mg | INTRAMUSCULAR | Status: DC | PRN

## 2020-01-09 MED ORDER — ONDANSETRON HCL 4 MG/2ML IJ SOLN: 4.0000 mg | Freq: Four times a day (QID) | INTRAMUSCULAR | Status: DC | PRN

## 2020-01-09 MED ORDER — ONDANSETRON 4 MG PO TBDP: 4.0000 mg | ORAL_TABLET | Freq: Four times a day (QID) | ORAL | Status: DC | PRN

## 2020-01-09 MED ORDER — MORPHINE SULFATE (CONCENTRATE) 10 MG/0.5ML PO SOLN: 5.0000 mg | ORAL | Status: DC | PRN

## 2020-01-09 NOTE — Progress Notes (Addendum)
Occupational Therapy Treatment Patient Details Name: Gloria [name] MRN: [id number] DOB: [birth year] Today's Date: 01/09/2020    History of present illness This 74 y.o. female admitted with flaccid Lt side and Rt gaze preference.  Pt received tPA.  CTA revealed Rt M1 occlusion and she was taken for emergent thrombectomy.  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  She was extubated 12/31/2019. PMH:  Pt is very HOH - reads lips, HTN, h/o kidney stones, A-Fib; s/p multiple cystocopies with stent placement.  x-rays of L LE were negative for fx, confirmed L knee OA   OT comments  Patient continues to make minimal progress towards goals in skilled OT session. Patient's session was limited due to continued loose stool (though improved from previous days) further weakness, and now following less than 25% of commands despite multi-modal cues and strategies. Pt with need for significant total A of 2 to roll and complete peri-care and unable to sit EOB due to deconditioning and understanding commands. Pt unable to follow commands to complete basic ADLS to date, and only oriented to name. Due to decline, discharge is now recommended to SNF due to inability to tolerate intensive therapy; will continue to follow acutely.    Follow Up Recommendations  SNF;Supervision/Assistance - 24 hour    Equipment Recommendations   (To be determined at next venue)    Recommendations for Other Services      Precautions / Restrictions Precautions Precautions: Fall;Other (comment) Precaution Comments: Rt gaze preference with Lt neglect, essentially deaf Required Braces or Orthoses: Other Brace Other Brace: does best with glasses donned and clear facemask so she can read your lips Restrictions Weight Bearing Restrictions: No       Mobility Bed Mobility Overal bed  mobility: Needs Assistance Bed Mobility: Rolling Rolling: Total assist;+2 for physical assistance         General bed mobility comments: Due to significant total A to roll to complete peri-care and decreased ability to follow commands, unable to sit EOB  Transfers                 General transfer comment: defer this session    Balance                                           ADL either performed or assessed with clinical judgement   ADL Overall ADL's : Needs assistance/impaired             Lower Body Bathing: Total assistance;Bed level   Upper Body Dressing : Total assistance;Bed level   Lower Body Dressing: Total assistance;Bed level   Toilet Transfer: Total assistance Toilet Transfer Details (indicate cue type and reason): Stomach remains distended, pt now minimally following commands despite strategies Toileting- Clothing Manipulation and Hygiene: Bed level;Total assistance Toileting - Clothing Manipulation Details (indicate cue type and reason): Frequent stool remains significant barrier     Functional mobility during ADLs: Total assistance;+2 for physical assistance General ADL Comments: Total A for bed mobility following commands less than 25% of the time, responses to prompting most of time incoherent/unintelligible     Vision       Perception     Praxis      Cognition Arousal/Alertness: Lethargic Behavior During Therapy: Flat  affect Overall Cognitive Status: Difficult to assess Area of Impairment: Orientation;Attention;Problem solving;Following commands;Memory;Awareness                 Orientation Level: Disoriented to;Place;Time;Situation Current Attention Level: Focused Memory: Decreased recall of precautions;Decreased short-term memory Following Commands: Follows one step commands inconsistently Safety/Judgement: Decreased awareness of deficits;Decreased awareness of safety Awareness: Intellectual Problem  Solving: Slow processing;Decreased initiation;Difficulty sequencing;Requires verbal cues;Requires tactile cues General Comments: Pt following less commands for therapist and confirmed with RN that pt is showing increased cognitive deficits to date        Exercises     Shoulder Instructions       General Comments      Pertinent Vitals/ Pain       Pain Assessment: Faces Faces Pain Scale: Hurts a little bit Pain Location: stomach Pain Descriptors / Indicators: Grimacing;Guarding Pain Intervention(s): Limited activity within patient's tolerance;Monitored during session;Repositioned  Home Living                                          Prior Functioning/Environment              Frequency  Min 2X/week        Progress Toward Goals  OT Goals(current goals can now be found in the care plan section)  Progress towards OT goals: Not progressing toward goals - comment (Pt with increased need for assist and to follow one step commands)  Acute Rehab OT Goals OT Goal Formulation: Patient unable to participate in goal setting Time For Goal Achievement: 01/14/20 Potential to Achieve Goals: [place] Discharge plan needs to be updated;Frequency remains appropriate    Co-evaluation                 AM-PAC OT "6 Clicks" Daily Activity     Outcome Measure   Help from another person eating meals?: Total Help from another person taking care of personal grooming?: Total Help from another person toileting, which includes using toliet, bedpan, or urinal?: Total Help from another person bathing (including washing, rinsing, drying)?: Total Help from another person to put on and taking off regular upper body clothing?: Total Help from another person to put on and taking off regular lower body clothing?: Total 6 Click Score: 6    End of Session    OT Visit Diagnosis: Unsteadiness on feet (R26.81);Muscle weakness (generalized) (M62.81);Cognitive communication  deficit (R41.841);Hemiplegia and hemiparesis Symptoms and signs involving cognitive functions: Cerebral infarction Hemiplegia - Right/Left: Left Hemiplegia - dominant/non-dominant: Non-Dominant Hemiplegia - caused by: Cerebral infarction   Activity Tolerance Patient limited by fatigue;Patient limited by lethargy   Patient Left in bed;with bed alarm set;with call bell/phone within reach   Nurse Communication Mobility status;Need for lift equipment        Time: 1126-1141 OT Time Calculation (min): 15 min  Charges: OT General Charges $OT Visit: 1 Visit OT Treatments $Self Care/Home Management : 8-22 mins  Corinne Ports E. Carold Eisner, COTA/L Acute Rehabilitation Services [phone] [place] 01/09/2020, 12:27 PM

## 2020-01-09 NOTE — Progress Notes (Signed)
STROKE TEAM PROGRESS NOTE   SUBJECTIVE (INTERVAL HISTORY) Patient is lying in bed.  She continues to have abdominal distention and ileus.  .  NG tube to suction which shows dark bilious secretions.  Her potassium is still low at 2.4.  Despite replacement.  Vital signs are stable.  Continues to have left hemiplegia and right gaze deviation.  I spoke to patient's brother over the phone who is health power of attorney he has talked with the family members last night and family has decided on comfort care measures only and are interested in moving her to hospice nursing home in [place] to be close to the family.   OBJECTIVE Temp:  [97.7 F (36.5 C)-99.1 F (37.3 C)] 97.7 F (36.5 C) (07/08 1252) Pulse Rate:  [67-80] 71 (07/08 1252) Cardiac Rhythm: Atrial fibrillation;Bundle branch block (07/07 1919) Resp:  [17-20] 17 (07/08 1252) BP: (141-152)/(58-80) 145/80 (07/08 1252) SpO2:  [94 %-99 %] 99 % (07/08 1252) Weight:  [89.9 kg] 89.9 kg (07/08 0500)  Recent Labs  Lab 01/08/20 1936 01/08/20 2334 01/09/20 0411 01/09/20 0821 01/09/20 1118  GLUCAP 86 102* 108* 118* 106*   Recent Labs  Lab 01/03/20 0442 01/03/20 0442 01/06/20 0719 01/06/20 0719 01/07/20 0447 01/08/20 0414 01/09/20 0428  NA 140  --  139  --  143 146* 147*  K 4.4  --  2.9*  --  3.4* 2.7* 2.4*  CL 108  --  108  --  111 113* 117*  CO2 23  --  20*  --  21* 21* 19*  GLUCOSE 126*  --  131*  --  102* 90 110*  BUN 31*  --  40*  --  42* 39* 31*  CREATININE 1.32*  --  1.49*  --  1.45* 1.41* 1.29*  CALCIUM 8.4*   < > 8.3*   < > 8.2* 8.2* 8.3*  MG  --   --  2.1  --   --   --  2.0  PHOS  --   --  4.2  --   --   --   --    < > = values in this interval not displayed.   Recent Labs  Lab 01/06/20 0719  AST 48*  ALT 26  ALKPHOS 71  BILITOT 0.9  PROT 5.7*  ALBUMIN 2.6*   Recent Labs  Lab 01/03/20 0442 01/06/20 0719 01/09/20 0428  WBC 5.2 7.5 6.8  HGB 9.3* 10.4* 9.4*  HCT 29.4* 32.7* 29.4*  MCV 103.2* 104.5* 108.5*   PLT 212 339 343    IMAGING No results found.   PHYSICAL EXAM  Elderly Caucasian lady seems to be in mild distress due to abdominal distention. . Afebrile. Head is nontraumatic. Neck is supple without bruit.    Cardiac exam no murmur or gallop. Lungs are clear to auscultation. Distal pulses are well felt.  She is extremely hard of hearing.  Abdomen greatly distended.  Bowel sounds very diminished.  Slight tenderness over abdomen diffusely Neurological Exam :  Patient is deaf and difficult to communicate with.  She is lying in bed.  She has right gaze preference and head deviation.  She follows simple commands.  Speech is slow but clear.  Right gaze deviation.  Does not blink to threat on the left but does so on the right.  Left lower facial weakness.  Tongue midline.  Left upper extremity is flaccid and weak.  Withdraws left lower extremity to pain.  Purposeful antigravity movements on the right side.  ASSESSMENT/PLAN Ms. SORAIYA AHNER is a 74 y.o. female with history of bilateral ureteral calculi, HTN, Atrial Fibrillation not on anticoagulation (not on [facility] due to high risk of falls), who presents with left sided hemiplegia and right gaze preference. CTH in the ED revealed left frontal meningioma, CTA with a right M1 occlusion. She received IVTPA on 12/29/19 at 1830 w/LKW 1645 and was taken for mechanical thrombectomy with resulting TICI 2B revascularization w/placement of recue stent in the right MCA.  Stroke - R MCA and PCA infarcts due to R M1 occlusion s/p tPA and EVT with TICI2b and R MCA stenting, likely cardio embolic due to AF not on [facility]   Code Stroke CT head No acute abnormality. Small vessel disease. Atrophy. ASPECTS 10.     CTA head & neck R M1 occlusion, fetal PCA on the right  Cerebral angio R M1 occlusion w/p TICI 2b revascularization. 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    Post IR CT hyperdensity R subarachnoid space, stable L parafalcine  meningioma  MRI  R occipital infarct. R insular and frontal operculum infarct. Small scattered R frontal and parietal infarcts.  MRA  fetal R PCA. Missing superior division R MCA. R MCA stent, inferior division with flow.  LE Doppler  No DVT  2D Echo EF > 75%, severe LVH. Mild to moderate TVR.  LDL 90  HgbA1c 5.2  Heparin subq for VTE prophylaxis  aspirin 325 mg daily prior to admission, now on aspirin 81 mg daily and Brilinta (ticagrelor) 90 mg bid given stent placement. Plan to continue Brilinta at d/c with [facility] in 5-7 days (around 01/09/20 if anemia stable).   Therapy recommendations:  CIR  Disposition:  pending   Ileus/SBO  Extremely distended Abd  KUB 7/3 w/ dilated small bowel loop c/w ileus  Stop TF and po intake except aspirin and brilinta  Pt w/ 2 BMs this am, 1 loose and 1 soft. Had BMs over the weekend  Nausea w/ vomiting this am  IM consulted. Likely needs decompression  Acute Respiratory Failure, resolved  intubated for IR, left intubated for airway protection  Extubation 6/29  Tolerated well  Hematoma R groin  pseudoanerysm w/ extravasation following IR  CT abd & pelvis R inguinal hematoma into abdominal wall. Stable B ureteral stents and B adrenal adenoma. Aortic atherosclerosis.    S/p 1u PRBC for Hgb 7.3  [place] right groin x 2 - no pseudoaneurysm   Hgb now 10.4  Hematuria, resolved  S/p B ureteral stents exchange 6/4 d/t B renal calculi  ABD CT Mild B hydronephrosis, stents in place  Hgb 9.2->8.8->8.4->7.8->7.3 - 1u PRBC - 8.7->8.6->9.3->10.4  Urology consulted, appreciate help  Ureteral stents removed by urology 6/30 followed by 1 dose cipro  No gross hematuria now  ongoing Cre monitor, if elevated, consider [place] abd to rule out hydro  Cre 1.49  Atrial Fibrillation  Home anticoagulation:  none d/t high fall risk  Not timing for [facility] at this time  Plan with [facility] in 5-7 days (around 01/09/20) if anemia stable   Left leg  swelling  LE venous doppler no DVT  XR of left knee severe OA and hip neg  On heparin subq for DVT prophylaxis   Continue SCDs  Hypertension  Home meds:  Coreg 6.25 bid  Stable 110-140  SBP goal now < 180  Long-term BP goal normotensive  Hyperlipidemia  Home meds:  No statin  LDL 90, goal < 70  lipitor 40 now  Continue statin at discharge  Dysphagia  Secondary to stroke  Cleared for dys1 with nectar thick, poor intake  Has Cortrak   On TF @  25 to facilitate oral intake, now on hold  Speech on board  Increased IVF to NS w/ 20K at 70h   CKD stage IIIab  Cre 1.49  Could be due to contrast nephrotoxicity  Increased IVF   Continue monitoring BMP  Other Stroke Risk Factors  Advanced age  Obesity, Body mass index is 38.59 kg/m., recommend weight loss, diet and exercise as appropriate   Other Active Problems  Urinary incontinence on mirabegron PTA  HOH  Transient bradycardia w/ HR to 33 7/5  hypokalemia 2.9 - supplement - recheck in am  Hospital day # 11   Patient neurologically remains unchanged and abdominal distention is is persistent..  She continues to have hypokalemia.    Appreciate medical hospitalist team and palliative care team help.  I have spoken to patient's next of kin and health power of attorney her brother who feel family has agreed to comfort care measures only and request transfer to hospice nursing home in [place] to be close to the family.  I am in agreement and will transition her to comfort care measures only today.  Discussed with Dr. Reesa Chew and palliative care nurse practitioner greater than 50% time during this 25-minute visit was spent on counseling and coordination of care about her abdominal distention and stroke and discussion with care team. Antony Contras, MD     To contact Stroke Continuity provider, please refer to [email or web address] After hours, contact General Neurology

## 2020-01-09 NOTE — Progress Notes (Signed)
SLP Cancellation Note  Patient Details Name: Gloria [name] MRN: [id number] DOB: 11-30-1945   Cancelled treatment/ Discharge:        Pt has transitioned to comfort care.  Our services will respectfully sign off.  Dimond Crotty L. Tivis Ringer, [place] Office number [phone] Pager [phone]   Assunta Curtis 01/09/2020, 2:49 PM

## 2020-01-09 NOTE — Progress Notes (Signed)
PT Cancellation Note  Patient Details Name: Gloria [name] MRN: [id number] DOB: September 24, 1945   Cancelled Treatment:    Reason Eval/Treat Not Completed: Other (comment) (noted pt is now comfort care, phone pt's brother, [name], who stated pt requested PT "stop hurting me" and agreed to PT signing off as pt has not been able to participate/benefit from PT. Will sign off.) Informed [name] that PT can be reordered if desired.    Blondell Reveal Kistler PT 01/09/2020  Acute Rehabilitation Services Pager [phone] Office [phone]

## 2020-01-09 NOTE — Progress Notes (Signed)
Inpatient Rehab Admissions Coordinator:     Note that, per discussion with Palliative, family has elected SNF versus hospice.  Will sign off for CIR at this time. Please feel free to contact us again should family wishes change.   Shann Medal, PT, DPT Admissions Coordinator [phone] 01/09/20  9:31 AM

## 2020-01-09 NOTE — Progress Notes (Signed)
PROGRESS NOTE    Gloria [name]  CHE:[id number] DOB: [birth year] DOA: 12/29/2019 PCP: Maryella Shivers, MD   Brief Narrative:  74 y.o.femalewith history of bilateral ureteral calculi, HTN, Atrial Fibrillation not on anticoagulation (not on AC due to high risk of falls), who presents with left sided hemiplegia and right gaze preference. CTH in the ED revealed left frontal meningioma, CTA with a right M1 occlusion. She received IVTPA on 12/29/19 and was taken for mechanical thrombectomy as well as revascularization w/placement of recue stent in the right MCA.  MRI revealed right occipital infarct, right insular and frontal operculum infarct, small scattered right frontal and parietal infarcts.  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  Patient was intubated for IR procedure, airway protection and extubated on 6/29..  Following IR procedure patient also developed hematoma in the right groin due to pseudoaneurysm with extravasation.  CT abdomen and pelvis showed hematoma extension into abdominal wall.  Patient received 1 unit of PRBC for hemoglobin 7.3.  Ultrasonogram of the right groin however did not reveal any pseudoaneurysm and hemoglobin been stable around 10.  Now on aspirin and Brilinta given stent placement with plans to discharge on anticoagulation/Brilinta in 5 to 7 days. Hospital course complicated by abdominal distention associated with nausea, vomiting over the weekend although patient been having BMs.  KUB on 7/3 showed dilated small bowel loops consistent with ileus.  Patient been on cortrak for tube feeds given dysphagia as a resultant of stroke.  Speech has cleared for dysphagia 1 with nectar thick.  [facility] service consulted on 7/5 due to persistent symptoms for further evaluation and management.    Assessment & Plan:   Principal Problem:   Stroke (cerebrum) ([place]) - R MCA & PCA d.t R M1  occlusion s/p tPA and IR w/ partial revascularization and R MCA stenting, embolic d/t AF not on AC Active Problems:   Middle cerebral artery embolism, right   Atrial fibrillation (HCC)   Groin hematoma, R s/p IR for stroke   Hematuria   Accelerated hypertension   Hyperlipidemia   Dysphagia following cerebral infarction   CKD (chronic kidney disease), stage IIIa   Obesity (BMI 30-39.9)   Urinary incontinence   HOH (hard of hearing)   Acute blood loss anemia   Essential hypertension   Goals of care, counseling/discussion   Palliative care by specialist   DNR (do not resuscitate)  Acute mild respiratory distress, improved Multifactorial suspect secondary to mild fluid overload versus reactive airway.  BNP slightly elevated at 500, chest x-ray shows bronchitic changes. Continue bronchodilators, Pulmicort Hydration with caution, if necessary will give IV Lasix.  Adynamic ileus with severe hypokalemia In the setting of CVA/postsurgical.  Recommend out of bed to chair.  Keep her n.p.o., maintain NG tube to low intermittent suction.  Abdomen seems slightly soft today, will repeat another x-ray tomorrow morning Aggressively replete electrolytes, repeat labs later today  Mild hyponatremia/hypochloremia -Change fluids to D5 water  Embolic CVA In the setting of A. fib not on anticoagulation.  Status post TPA 6/27, thrombectomy and right MCA stent.  Now on aspirin and Brilinta with eventual plans to transition to Brilinta with anticoagulation. Dysphagia 1 diet Care per neurology team  Right groin hematoma Status post 1 unit PRBC.  Hemoglobin currently stable. CT abdomen pelvis showed right inguinal hematoma but no pseudoaneurysm seen on ultrasound  Bilateral renal calculi, hematuria Status post stent  exchange t by urology 6/4, stent removed 6/30 by urology   Left leg swelling X-ray of the left knee showed severe osteoarthritis, left hip x-ray was negative.  Venous Doppler ruled out  DVT.  Avoid fluid overload.  Hypertension, hyperlipidemia  Continue Coreg and Lipitor started in this admission.  CKD stage IIIb  Baseline creatinine appears to be around 1.3-1.4.  S/p bilateral ureteral stents recently, now removed as explained above.  Continue to monitor given GI losses.  Avoid nephrotoxins.  Currently on potassium containing IV fluids, monitor renal function and potassium level.  Goals of care discussion -Palliative care team following.  Also spoke with patient's brother [name] this morning-interested in transitioning to comfort care over next 24-48 hours if no improvement noted.  They are aware of overall poor prognosis  DVT prophylaxis: Subcu heparin Code Status: DNR Family Communication: Spoke with [name] this morning  Status is: Inpatient    Body mass index is 36.25 kg/m.     Subjective: Minimal interaction does not communicate appropriately.  She appears to be in less distress this morning.  Tolerating NG tube with 300 cc output over last 24 hours.  Review of Systems Otherwise negative except as per HPI, including: General: Denies fever, chills, night sweats or unintended weight loss. Resp: Denies cough, wheezing, shortness of breath. Cardiac: Denies chest pain, palpitations, orthopnea, paroxysmal nocturnal dyspnea. GI: Denies abdominal pain, nausea, vomiting, diarrhea or constipation GU: Denies dysuria, frequency, hesitancy or incontinence MS: Denies muscle aches, joint pain or swelling Neuro: Denies headache, neurologic deficits (focal weakness, numbness, tingling), abnormal gait Psych: Denies anxiety, depression, SI/HI/AVH Skin: Denies new rashes or lesions ID: Denies sick contacts, exotic exposures, travel Examination:  Constitutional: Appears chronically ill, NG tube in place Respiratory: Clear to auscultation bilaterally Cardiovascular: Normal sinus rhythm, no rubs Abdomen: But softer than yesterday.  Diminished bowel  sounds Musculoskeletal: No edema noted Skin: No rashes seen Neurologic: Difficult to assess neuro exam Psychiatric: Poor judgment and insight NGT In place  Objective: Vitals:   01/08/20 2336 01/09/20 0401 01/09/20 0500 01/09/20 0819  BP: (!) 141/58 (!) 144/63  (!) 152/74  Pulse: 76 71  67  Resp: 19 18  20   Temp: 99.1 F (37.3 C) 98.6 F (37 C)  97.9 F (36.6 C)  TempSrc: Oral Oral  Oral  SpO2: 95% 99%  96%  Weight:   89.9 kg     Intake/Output Summary (Last 24 hours) at 01/09/2020 1151 Last data filed at 01/09/2020 0915 Gross per 24 hour  Intake 513.05 ml  Output 1025 ml  Net -511.95 ml   Filed Weights   01/05/20 0315 01/06/20 0337 01/09/20 0500  Weight: 96.5 kg 95.8 kg 89.9 kg     Data Reviewed:   CBC: Recent Labs  Lab 01/03/20 0442 01/06/20 0719 01/09/20 0428  WBC 5.2 7.5 6.8  HGB 9.3* 10.4* 9.4*  HCT 29.4* 32.7* 29.4*  MCV 103.2* 104.5* 108.5*  PLT 212 339 644   Basic Metabolic Panel: Recent Labs  Lab 01/03/20 0442 01/06/20 0719 01/07/20 0447 01/08/20 0414 01/09/20 0428  NA 140 139 143 146* 147*  K 4.4 2.9* 3.4* 2.7* 2.4*  CL 108 108 111 113* 117*  CO2 23 20* 21* 21* 19*  GLUCOSE 126* 131* 102* 90 110*  BUN 31* 40* 42* 39* 31*  CREATININE 1.32* 1.49* 1.45* 1.41* 1.29*  CALCIUM 8.4* 8.3* 8.2* 8.2* 8.3*  MG  --  2.1  --   --  2.0  PHOS  --  4.2  --   --   --  GFR: Estimated Creatinine Clearance: 39.9 mL/min (A) (by C-G formula based on SCr of 1.29 mg/dL (H)). Liver Function Tests: Recent Labs  Lab 01/06/20 0719  AST 48*  ALT 26  ALKPHOS 71  BILITOT 0.9  PROT 5.7*  ALBUMIN 2.6*   No results for input(s): LIPASE, AMYLASE in the last 168 hours. No results for input(s): AMMONIA in the last 168 hours. Coagulation Profile: No results for input(s): INR, PROTIME in the last 168 hours. Cardiac Enzymes: No results for input(s): CKTOTAL, CKMB, CKMBINDEX, TROPONINI in the last 168 hours. BNP (last 3 results) No results for input(s): PROBNP in  the last 8760 hours. HbA1C: No results for input(s): HGBA1C in the last 72 hours. CBG: Recent Labs  Lab 01/08/20 1936 01/08/20 2334 01/09/20 0411 01/09/20 0821 01/09/20 1118  GLUCAP 86 102* 108* 118* 106*   Lipid Profile: No results for input(s): CHOL, HDL, LDLCALC, TRIG, CHOLHDL, LDLDIRECT in the last 72 hours. Thyroid Function Tests: Recent Labs    01/07/20 0447  TSH 4.769*   Anemia Panel: Recent Labs    01/09/20 0853  VITAMINB12 483   Sepsis Labs: No results for input(s): PROCALCITON, LATICACIDVEN in the last 168 hours.  No results found for this or any previous visit (from the past 240 hour(s)).       Radiology Studies: DG Abd 1 View  Result Date: 01/07/2020 CLINICAL DATA:  Nasogastric tube placement. EXAM: ABDOMEN - 1 VIEW COMPARISON:  January 07, 2020 (6:50 a.m.) FINDINGS: The feeding tube seen on the prior study has been removed and has been replaced with a nasogastric tube. Its distal tip is seen overlying the expected region of the body of the stomach. Multiple distended loops of small and large bowel are again seen without interval change in caliber when compared to the prior study. No radio-opaque calculi or other significant radiographic abnormality are seen. IMPRESSION: Nasogastric tube positioning, as described above. Electronically Signed   By: Virgina Norfolk M.D.   On: 01/07/2020 15:17   DG ABD ACUTE 2+V W 1V CHEST  Result Date: 01/08/2020 CLINICAL DATA:  Follow-up ileus.  Diarrhea. EXAM: DG ABDOMEN ACUTE W/ 1V CHEST COMPARISON:  Abdominal x-rays 01/07/2020 and earlier, including CT abdomen and pelvis 12/29/2019. No prior chest x-ray. FINDINGS: Moderate gaseous distension of the ascending and transverse colon, not significantly changed since yesterday. The gaseous distension of the small bowel has improved. No evidence of free intraperitoneal air on the [employer]. Nasogastric tube tip projects over the expected location of the mid stomach, unchanged. Contrast  material in sigmoid colon diverticula is again noted. Cardiac silhouette mildly to moderately enlarged for AP technique. Moderate central peribronchial thickening. Lungs otherwise clear. Mild pulmonary venous hypertension without overt edema. IMPRESSION: 1. Stable moderate colonic ileus. Improved small bowel ileus. No free intraperitoneal air. 2. Stable cardiomegaly. Mild pulmonary venous hypertension without overt edema. Moderate changes of bronchitis and/or asthma without focal airspace pneumonia. Electronically Signed   By: Evangeline Dakin M.D.   On: 01/08/2020 11:14        Scheduled Meds: .  stroke: mapping our early stages of recovery book   Does not apply Once  . aspirin  81 mg Oral Daily   Or  . aspirin  81 mg Per Tube Daily  . budesonide (PULMICORT) nebulizer solution  0.5 mg Nebulization BID  . chlorhexidine  15 mL Mouth Rinse BID  . Chlorhexidine Gluconate Cloth  6 each Topical Daily  . heparin injection (subcutaneous)  5,000 Units Subcutaneous Q8H  .  ipratropium-albuterol  3 mL Nebulization BID  . mouth rinse  15 mL Mouth Rinse q12n4p  . ticagrelor  90 mg Oral BID   Or  . ticagrelor  90 mg Per Tube BID   Continuous Infusions: . dextrose 75 mL/hr at 01/09/20 0903  . potassium chloride 10 mEq (01/09/20 1147)     LOS: 11 days   Time spent= 45 mins    Brei Pociask Arsenio Loader, MD [facility]  If 7PM-7AM, please contact night-coverage  01/09/2020, 11:51 AM

## 2020-01-09 NOTE — Progress Notes (Signed)
Daily Progress Note   Patient Name: Gloria [name]       Date: 01/09/2020 DOB: [birth year]  Age: 74 y.o. MRN#: [id number] Attending Physician: Gloria Fila, MD Primary Care Physician: Gloria Shivers, MD Admit Date: 12/29/2019   Reason for Consultation/Follow-up: Establishing goals of care  Subjective: More interactive today but seems confused. Telling me her mouth is dry; attempted to clean mouth but she refused. Unable to discuss goals of care.   Length of Stay: 11  Current Medications: Scheduled Meds:  . antiseptic oral rinse  15 mL Topical BID  . mouth rinse  15 mL Mouth Rinse q12n4p    Continuous Infusions:   PRN Meds: acetaminophen **OR** acetaminophen (TYLENOL) oral liquid 160 mg/5 mL **OR** acetaminophen, glycopyrrolate **OR** glycopyrrolate **OR** glycopyrrolate, haloperidol **OR** haloperidol **OR** haloperidol lactate, ipratropium-albuterol, morphine injection, morphine CONCENTRATE, ondansetron **OR** ondansetron (ZOFRAN) IV, polyvinyl alcohol  Physical Exam Constitutional:      General: She is not in acute distress.    Comments: lethargic  Pulmonary:     Effort: Pulmonary effort is normal. No respiratory distress.  Skin:    General: Skin is warm and dry.  Neurological:     Mental Status: She is disoriented.             Vital Signs: BP (!) 145/80 (BP Location: Left Arm)   Pulse 71   Temp 97.7 F (36.5 C) (Oral)   Resp 17   Wt 89.9 kg   SpO2 99%   BMI 36.25 kg/m  SpO2: SpO2: 99 % O2 Device: O2 Device: Room Air O2 Flow Rate: O2 Flow Rate (L/min): 1 L/min  Intake/output summary:   Intake/Output Summary (Last 24 hours) at 01/09/2020 1424 Last data filed at 01/09/2020 0915 Gross per 24 hour  Intake 513.05 ml  Output 1025 ml  Net -511.95 ml   LBM: Last BM Date:  01/09/20 Baseline Weight: Weight: 95.7 kg Most recent weight: Weight: 89.9 kg       Palliative Assessment/Data: PPS 20%    Flowsheet Rows     Most Recent Value  Intake Tab  Referral Department Neurology  Unit at Time of Referral Cardiac/Telemetry Unit  Palliative Care Primary Diagnosis Neurology  Date Notified 01/07/20  Palliative Care Type New Palliative care  Reason for referral Clarify Goals of Care  Date of Admission 12/29/19  Date first seen by Palliative Care 01/08/20  # of days Palliative referral response time 1 Day(s)  # of days IP prior to Palliative referral 9  Clinical Assessment  Palliative Performance Scale Score 20%  Psychosocial & Spiritual Assessment  Palliative Care Outcomes  Patient/Family meeting held? Yes  Who was at the meeting? brother  Palliative Care Outcomes Clarified goals of care, Provided advance care planning, Provided psychosocial or spiritual support, Changed CPR status      Patient Active Problem List   Diagnosis Date Noted  . Goals of care, counseling/discussion   . Palliative care by specialist   . DNR (do not resuscitate)   . Atrial fibrillation ([place]) 01/01/2020  . Groin hematoma, R s/p IR for stroke 01/01/2020  . Hematuria 01/01/2020  . Accelerated hypertension 01/01/2020  . Hyperlipidemia 01/01/2020  . Dysphagia following cerebral infarction 01/01/2020  .  CKD (chronic kidney disease), stage IIIa 01/01/2020  . Obesity (BMI 30-39.9) 01/01/2020  . Urinary incontinence 01/01/2020  . HOH (hard of hearing) 01/01/2020  . Acute blood loss anemia   . Essential hypertension   . Middle cerebral artery embolism, right 12/30/2019  . 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 Acute renal failure (ARF) ([place]) 11/08/2019  . Obstructive uropathy 11/08/2019  . Hyperkalemia 11/08/2019  . Metabolic acidosis 94/70/9628  .  Hyperphosphatemia 11/08/2019    Palliative Care Assessment & Plan   HPI: 74 y.o. female  with past medical history of HTN, a fib (not on AC d/t high risk of falls), severe OA, and CKD admitted on 12/29/2019 with L sided hemiplegia and R gaze preference. CT revealed L frontal meningioma and R M1 occlusion. She received IV TPA on 6/27 and underwent mechanical thrombectomy and stent placement.  MRI revealed right occipital infarct, right insular and frontal operculum infarct, small scattered right frontal and parietal infarcts. Patient is being treated for embolic CVA d/t a fib. Patient was started on dysphagia 1 diet but that has been stopped d/t ileus. Now has NG for decompression. PMT consulted for [place].  Assessment: Discussed with neuro and Dr. Reesa Chew - Neurology had discussion with brother this AM and brother opted for comfort care and hospice facility.  Follow up with brother - he is clear that his main goal for [name] is that she is peaceful and not suffer. We discussed transitioning orders to only measures necessary to ensure comfort. Brother in agreement. He would like NG removed before he visits so that she looks more like herself. He understands poor prognosis.  He plans to meet with hospice of [facility] this afternoon.   All questions and concerns addressed  Recommendations/Plan:  All measures not needed to ensure comfort discontinued - d/c NG, alternative means to manage nausea if nausea persists  PRNs added to ensure comfort  Discharge to hospice facility when bed available  Goals of Care and Additional Recommendations:  Limitations on Scope of Treatment: Full Comfort Care  Code Status:  DNR  Prognosis:   < 2 weeks  Discharge Planning:  Hospice facility  Care plan was discussed with RN, neurology, Dr. Reesa Chew, brother  Thank you for allowing the Palliative Medicine Team to assist in the care of this patient.   Total Time 25 minutes Prolonged Time Billed   no       Greater than 50%  of this time was spent counseling and coordinating care related to the above assessment and plan.  Juel Burrow, DNP, [facility] Palliative Medicine Team Team Phone # [phone]  Pager [phone]

## 2020-01-09 NOTE — TOC Progression Note (Signed)
Transition of Care [facility]) - Progression Note    Patient Details  Name: Gloria [name] MRN: [id number] Date of Birth: July 12, 1945  Transition of Care [facility]) CM/SW [place], [place] Phone Number: 01/09/2020, 1:30 PM  Clinical Narrative:   CSW alerted by MD after speaking to brother that the family would like to transition to comfort care, interest in residential hospice at [facility]. CSW contacted [facility] and provided referral, no beds available today. CSW to continue to follow.    Expected Discharge Plan: [place] Barriers to Discharge: Hospice Bed not available  Expected Discharge Plan and Services Expected Discharge Plan: [place] In-house Referral: Clinical Social Work   Post Acute Care Choice: [place] Living arrangements for the past 2 months: Single Family Home                                       Social Determinants of Health (SDOH) Interventions    Readmission Risk Interventions No flowsheet data found.

## 2020-01-10 DIAGNOSIS — E876 Hypokalemia: Secondary | ICD-10-CM | POA: Diagnosis not present

## 2020-01-10 DIAGNOSIS — K567 Ileus, unspecified: Secondary | ICD-10-CM | POA: Diagnosis not present

## 2020-01-10 LAB — FOLATE RBC
Folate, Hemolysate: 477 ng/mL
Folate, RBC: 1544 ng/mL (ref >498)
Hematocrit: 30.9 % — ABNORMAL LOW (ref 34.0–46.6)

## 2020-01-10 NOTE — Progress Notes (Signed)
Pt refused oral care again. Stated she didn't want to get upset and it smelled like poop. Showed pt the toothbrush and she said she wanted hers. RN was only able to wipe around her mouth.

## 2020-01-10 NOTE — Progress Notes (Signed)
Nutrition Brief Note  Chart reviewed. Pt now transitioning to comfort care.  No further nutrition interventions warranted at this time.  Please re-consult as needed.   Shawntia Mangal W, RD, LDN, CDCES Registered Dietitian II Certified Diabetes Care and Education Specialist Please refer to [facility] for RD and/or RD on-call/weekend/after hours pager  

## 2020-01-10 NOTE — TOC Transition Note (Signed)
Transition of Care [facility]) - CM/SW Discharge Note   Patient Details  Name: Gloria [name] MRN: [id number] Date of Birth: 1945/10/25  [facility] CM/SW Contact:  [name], LCSW Phone Number: 01/10/2020, 1:31 PM   Clinical Narrative:   Nurse to call report to [phone].    Final next level of care: [place] Barriers to Discharge: Barriers Resolved   Patient Goals and CMS Choice Patient states their goals for this hospitalization and ongoing recovery are:: patient unable to participate in goal setting CMS Medicare.gov Compare Post Acute Care list provided to:: Patient Choice offered to / list presented to : Patient, Sibling  Discharge Placement                Patient to be transferred to facility by: [place] Name of family member notified: [name] Patient and family notified of of transfer: 01/10/20  Discharge Plan and Services In-house Referral: Clinical Social Work   Post Acute Care Choice: [place]                               Social Determinants of Health ([place]) Interventions     Readmission Risk Interventions No flowsheet data found.

## 2020-01-10 NOTE — Plan of Care (Signed)
Pt is alert. VSS. Comfort measures continued. Problem: Education: Goal: Knowledge of General Education information will improve Description: Including pain rating scale, medication(s)/side effects and non-pharmacologic comfort measures Outcome: Progressing   Problem: Health Behavior/Discharge Planning: Goal: Ability to manage health-related needs will improve Outcome: Progressing   Problem: Clinical Measurements: Goal: Ability to maintain clinical measurements within normal limits will improve Outcome: Progressing Goal: Will remain free from infection Outcome: Progressing Goal: Diagnostic test results will improve Outcome: Progressing Goal: Respiratory complications will improve Outcome: Progressing Goal: Cardiovascular complication will be avoided Outcome: Progressing   Problem: Activity: Goal: Risk for activity intolerance will decrease Outcome: Progressing   Problem: Nutrition: Goal: Adequate nutrition will be maintained Outcome: Progressing   Problem: Coping: Goal: Level of anxiety will decrease Outcome: Progressing   Problem: Elimination: Goal: Will not experience complications related to bowel motility Outcome: Progressing Goal: Will not experience complications related to urinary retention Outcome: Progressing   Problem: Pain Managment: Goal: General experience of comfort will improve Outcome: Progressing   Problem: Safety: Goal: Ability to remain free from injury will improve Outcome: Progressing   Problem: Skin Integrity: Goal: Risk for impaired skin integrity will decrease Outcome: Progressing   Problem: Education: Goal: Knowledge of disease or condition will improve Outcome: Progressing Goal: Knowledge of secondary prevention will improve Outcome: Progressing Goal: Knowledge of patient specific risk factors addressed and post discharge goals established will improve Outcome: Progressing Goal: Individualized Educational Video(s) Outcome:  Progressing   Problem: Coping: Goal: Will verbalize positive feelings about self Outcome: Progressing Goal: Will identify appropriate support needs Outcome: Progressing   Problem: Health Behavior/Discharge Planning: Goal: Ability to manage health-related needs will improve Outcome: Progressing   Problem: Self-Care: Goal: Ability to participate in self-care as condition permits will improve Outcome: Progressing Goal: Verbalization of feelings and concerns over difficulty with self-care will improve Outcome: Progressing Goal: Ability to communicate needs accurately will improve Outcome: Progressing   Problem: Nutrition: Goal: Risk of aspiration will decrease Outcome: Progressing Goal: Dietary intake will improve Outcome: Progressing   Problem: Ischemic Stroke/TIA Tissue Perfusion: Goal: Complications of ischemic stroke/TIA will be minimized Outcome: Progressing

## 2020-01-10 NOTE — Discharge Summary (Addendum)
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 s/p tPA and IR w/ partial revascularization and R MCA stenting, embolic d/t AF not on [facility] Active Problems:   Middle cerebral artery embolism, right   Atrial fibrillation ([place])   Groin hematoma, R s/p IR for stroke   Hematuria   Accelerated hypertension   Hyperlipidemia   Dysphagia following cerebral infarction   CKD (chronic kidney disease), stage IIIa   Obesity (BMI 30-39.9)   Urinary incontinence   HOH (hard of hearing)   Acute blood loss anemia   Essential hypertension   Goals of care, counseling/discussion   Palliative care by specialist   DNR (do not resuscitate)   Comfort measures only status   Ileus (HCC)   Hypokalemia Partial Ileus and bowel obstruction   Allergies as of 01/10/2020       Reactions   Aspartame And Phenylalanine Other (See Comments)   unknown   Azithromycin Other (See Comments)   Unknown   Bee Venom Other (See Comments)   Hyper   Cortizone-5 [hydrocortisone] Itching, Swelling   Olmesartan Other (See Comments)   unknown   Other Other (See Comments)   IV dye Dyes - unknown reaction   Penicillins Other (See Comments)   unknown   Valsartan Other (See Comments)   Unknown   Latex Rash, Other (See Comments)   Skin peels        Medication List     STOP taking these medications    aspirin 325 MG tablet   carvedilol  6.25 MG tablet Commonly known as: COREG   furosemide 20 MG tablet Commonly known as: LASIX   magnesium oxide 400 MG tablet Commonly known as: MAG-OX   mirabegron ER 25 MG Tb24 tablet Commonly known as: Myrbetriq   traMADol 50 MG tablet Commonly known as: Ultram        LABORATORY STUDIES CBC    Component Value Date/Time   WBC 6.8 01/09/2020 0428   RBC 2.71 (L) 01/09/2020 0428   HGB 9.4 (L) 01/09/2020 0428   HCT 29.4 (L) 01/09/2020 0428   PLT 343 01/09/2020 0428   MCV 108.5 (H) 01/09/2020 0428   MCH 34.7 (H) 01/09/2020 0428   MCHC 32.0 01/09/2020 0428   RDW 16.7 (H) 01/09/2020 0428   LYMPHSABS 0.4 (L) 12/30/2019 0500   MONOABS 0.1 12/30/2019 0500   EOSABS 0.0 12/30/2019 0500   BASOSABS 0.0 12/30/2019 0500   CMP    Component Value Date/Time   NA 147 (H) 01/09/2020 0428   K 2.4 (LL) 01/09/2020 0428   CL 117 (H) 01/09/2020 0428   CO2 19 (L) 01/09/2020 0428   GLUCOSE 110 (H) 01/09/2020 0428   BUN 31 (H) 01/09/2020 0428   CREATININE 1.29 (H) 01/09/2020 0428   CALCIUM 8.3 (L) 01/09/2020 0428   PROT 5.7 (L) 01/06/2020 0719   ALBUMIN 2.6 (L) 01/06/2020 0719   AST 48 (H) 01/06/2020 0719   ALT  26 01/06/2020 0719   ALKPHOS 71 01/06/2020 0719   BILITOT 0.9 01/06/2020 0719   GFRNONAA 41 (L) 01/09/2020 0428   GFRAA 47 (L) 01/09/2020 0428   COAGS Lab Results  Component Value Date   INR 1.1 12/29/2019   Lipid Panel    Component Value Date/Time   CHOL 143 12/30/2019 0500   TRIG 110 01/01/2020 0615   HDL 41 12/30/2019 0500   CHOLHDL 3.5 12/30/2019 0500   VLDL 12 12/30/2019 0500   LDLCALC 90 12/30/2019 0500   HgbA1C  Lab Results  Component Value Date   HGBA1C 5.2 12/30/2019   Urinalysis    Component Value Date/Time   COLORURINE YELLOW 11/08/2019 2001   APPEARANCEUR CLEAR 11/08/2019 2001   LABSPEC 1.012 11/08/2019 2001   PHURINE 5.0 11/08/2019 2001   GLUCOSEU NEGATIVE 11/08/2019 2001   HGBUR LARGE (A) 11/08/2019 2001   BILIRUBINUR NEGATIVE 11/08/2019  2001   [place] NEGATIVE 11/08/2019 2001   PROTEINUR 100 (A) 11/08/2019 2001   NITRITE NEGATIVE 11/08/2019 2001   LEUKOCYTESUR SMALL (A) 11/08/2019 2001    SIGNIFICANT DIAGNOSTIC STUDIES CT ABDOMEN PELVIS WO CONTRAST  Result Date: 12/29/2019 CLINICAL DATA:  Right inguinal hematoma, recent neuro interventional procedure, assess for retroperitoneal hematoma EXAM: CT ABDOMEN AND PELVIS WITHOUT CONTRAST TECHNIQUE: Multidetector CT imaging of the abdomen and pelvis was performed following the standard protocol without IV contrast. COMPARISON:  11/08/2019 FINDINGS: Lower chest: Hypoventilatory changes are seen within the dependent lower lobes. Heart is enlarged with trace pericardial fluid unchanged. Hepatobiliary: Gallbladder surgically absent. Unenhanced imaging the liver demonstrates no gross abnormalities. Pancreas: Unremarkable. No pancreatic ductal dilatation or surrounding inflammatory changes. Spleen: Normal in size without focal abnormality. Adrenals/Urinary Tract: Excreted contrast is seen within the bilateral kidneys related to previous neuro interventional procedure. Bilateral renal cortical thinning is noted. Stable bilateral adrenal adenoma. Bilateral ureteral stents extend from the renal pelves into the bladder lumen. No filling defects within the bladder. Stomach/Bowel: Enteric catheter extends into the gastric lumen. No bowel obstruction or ileus. No bowel wall thickening or inflammatory change. Vascular/Lymphatic: Aortic atherosclerosis. No enlarged abdominal or pelvic lymph nodes. Reproductive: Uterus and bilateral adnexa are unremarkable. Other: Hematoma is seen within the right inguinal region extending along the inguinal crease into the pannus of the right lower quadrant abdominal wall. This measures up to 3.7 cm in thickness. There is no evidence of extension into the retroperitoneal space. At the time of the exam, extrinsic compression is being held at the right groin. There is no free  fluid or free gas within the peritoneal cavity. Fat containing hernia again noted within the left lower quadrant abdominal wall. No bowel herniation. Musculoskeletal: No acute or destructive bony lesions. Reconstructed images demonstrate no additional findings. IMPRESSION: 1. Hematoma within the right inguinal region extending along the inguinal crease into the pannus of the right lower quadrant abdominal wall. No extension into the retroperitoneal space. 2. Bilateral ureteral stents as above. 3. Stable bilateral adrenal adenoma. 4. Aortic Atherosclerosis (ICD10-I70.0). Electronically Signed   By: Randa Ngo M.D.   On: 12/29/2019 23:48   CT Code Stroke CTA Head W/WO contrast  Result Date: 12/29/2019 CLINICAL DATA:  Left-sided weakness, code stroke follow-up EXAM: CT ANGIOGRAPHY HEAD AND NECK TECHNIQUE: Multidetector CT imaging of the head and neck was performed using the standard protocol during bolus administration of intravenous contrast. Multiplanar CT image reconstructions and MIPs were obtained to evaluate the vascular anatomy. Carotid stenosis measurements (when applicable) are obtained utilizing NASCET criteria, using the distal  internal carotid diameter as the denominator. CONTRAST:  37mL OMNIPAQUE IOHEXOL 350 MG/ML SOLN COMPARISON:  None. FINDINGS: CTA NECK Aortic arch: Minimal calcified plaque along the aortic arch. Mild calcified plaque at the great vessel origins, which are patent. Right carotid system: Patent. Common carotid has a retropharyngeal course. Mild calcified plaque at the ICA origin causing minimal stenosis. Left carotid system: Patent. Common carotid has a retropharyngeal course. Mild calcified plaque at the ICA origin causing minimal stenosis. Vertebral arteries: Patent. Left vertebral artery is dominant. Suspected stenosis of the proximal right vertebral artery. Skeleton: Degenerative changes of the cervical spine. Other neck: No mass or adenopathy. Upper chest: Enlargement of the  main pulmonary artery suggesting pulmonary arterial hypertension. Review of the MIP images confirms the above findings CTA HEAD Anterior circulation: Intracranial internal carotid arteries are patent with calcified plaque causing mild stenosis. Anterior cerebral arteries are patent. Left A1 ACA is dominant with diminutive or absent right A1 segment. There is occlusion of the mid right M1 MCA. There is preserved opacification of the more distal right MCA territory. Left MCA is patent. Posterior circulation: Intracranial vertebral arteries are patent. Basilar artery is patent. Posterior cerebral arteries are patent. There are posterior communicating arteries present bilaterally with fetal origin of the right PCA. Venous sinuses: Patent as allowed by contrast bolus timing. Review of the MIP images confirms the above findings IMPRESSION: Occlusion of the right M1 MCA. However, there is good filling of the more distal MCA territory. No hemodynamically significant stenosis in the neck. These results were communicated to Dr. Lorraine Lax at [place] 6/27/2021by text page via the [facility] messaging system. Electronically Signed   By: Macy Mis M.D.   On: 12/29/2019 19:10   DG Abd 1 View  Result Date: 01/07/2020 CLINICAL DATA:  Nasogastric tube placement. EXAM: ABDOMEN - 1 VIEW COMPARISON:  January 07, 2020 (6:50 a.m.) FINDINGS: The feeding tube seen on the prior study has been removed and has been replaced with a nasogastric tube. Its distal tip is seen overlying the expected region of the body of the stomach. Multiple distended loops of small and large bowel are again seen without interval change in caliber when compared to the prior study. No radio-opaque calculi or other significant radiographic abnormality are seen. IMPRESSION: Nasogastric tube positioning, as described above. Electronically Signed   By: Virgina Norfolk M.D.   On: 01/07/2020 15:17   CT HEAD WO CONTRAST  Result Date: 12/29/2019 CLINICAL DATA:  Stroke  follow-up.  Status post thrombectomy EXAM: CT HEAD WITHOUT CONTRAST TECHNIQUE: Contiguous axial images were obtained from the base of the skull through the vertex without intravenous contrast. COMPARISON:  Head CT 12/29/2019 FINDINGS: Brain: There is hyperdense material in the right subarachnoid space, overlying the frontal operculum and within the sylvian fissure. No other extra-axial collection. No midline shift or other mass effect. No intraparenchymal hemorrhage. 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 Vascular: Status post right MCA stent placement. Skull: Normal. Negative for fracture or focal lesion. Sinuses/Orbits: No acute finding. Other: None. IMPRESSION: 1. Hyperdense material in the right subarachnoid space, overlying the frontal operculum and within the Sylvian fissure. This is most likely contrast staining, but follow-up studies will be necessary to differentiate from subarachnoid hemorrhage. 2. Unchanged left parafalcine meningioma. Electronically Signed   By: Ulyses Jarred M.D.   On: 12/29/2019 23:49   CT Code Stroke CTA Neck W/WO contrast  Result Date: 12/29/2019 CLINICAL DATA:  Left-sided weakness, code stroke follow-up EXAM: CT ANGIOGRAPHY HEAD AND NECK TECHNIQUE: Multidetector  CT imaging of the head and neck was performed using the standard protocol during bolus administration of intravenous contrast. Multiplanar CT image reconstructions and MIPs were obtained to evaluate the vascular anatomy. Carotid stenosis measurements (when applicable) are obtained utilizing NASCET criteria, using the distal internal carotid diameter as the denominator. CONTRAST:  81mL OMNIPAQUE IOHEXOL 350 MG/ML SOLN COMPARISON:  None. FINDINGS: CTA NECK Aortic arch: Minimal calcified plaque along the aortic arch. Mild calcified plaque at the great vessel origins, which are patent. Right carotid system: Patent. Common carotid has a retropharyngeal course. Mild calcified plaque at the ICA origin causing minimal  stenosis. Left carotid system: Patent. Common carotid has a retropharyngeal course. Mild calcified plaque at the ICA origin causing minimal stenosis. Vertebral arteries: Patent. Left vertebral artery is dominant. Suspected stenosis of the proximal right vertebral artery. Skeleton: Degenerative changes of the cervical spine. Other neck: No mass or adenopathy. Upper chest: Enlargement of the main pulmonary artery suggesting pulmonary arterial hypertension. Review of the MIP images confirms the above findings CTA HEAD Anterior circulation: Intracranial internal carotid arteries are patent with calcified plaque causing mild stenosis. Anterior cerebral arteries are patent. Left A1 ACA is dominant with diminutive or absent right A1 segment. There is occlusion of the mid right M1 MCA. There is preserved opacification of the more distal right MCA territory. Left MCA is patent. Posterior circulation: Intracranial vertebral arteries are patent. Basilar artery is patent. Posterior cerebral arteries are patent. There are posterior communicating arteries present bilaterally with fetal origin of the right PCA. Venous sinuses: Patent as allowed by contrast bolus timing. Review of the MIP images confirms the above findings IMPRESSION: Occlusion of the right M1 MCA. However, there is good filling of the more distal MCA territory. No hemodynamically significant stenosis in the neck. These results were communicated to Dr. Lorraine Lax at [place] 6/27/2021by text page via the [facility] messaging system. Electronically Signed   By: Macy Mis M.D.   On: 12/29/2019 19:10   MR ANGIO HEAD WO CONTRAST  Result Date: 12/30/2019 CLINICAL DATA:  Status post right M1 occlusion with intervention EXAM: MRI HEAD WITHOUT CONTRAST MRA HEAD WITHOUT CONTRAST TECHNIQUE: Multiplanar, multiecho pulse sequences of the brain and surrounding structures were obtained without intravenous contrast. Angiographic images of the head were obtained using MRA  technique without contrast. COMPARISON:  Multiple CT studies done yesterday. FINDINGS: MRI HEAD FINDINGS Brain: Diffusion imaging shows acute infarction affecting the right occipital cortex. Acute infarction affects the right insular region and frontal operculum. Small foci of acute infarction elsewhere in the right frontal and parietal regions. Areas of infarction show mild swelling but there is no mass effect. Small focus of susceptibility in the right sylvian fissure region may be intravascular. Few small foci of susceptibility in the right parietal region also suspected to be intravascular. No sign of parenchymal hematoma. No hydrocephalus. No extra-axial collection. Mild chronic small-vessel ischemic changes elsewhere throughout the cerebral hemispheric white matter. 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 Vascular: Stent in the right M1 region. Skull and upper cervical spine: Negative Sinuses/Orbits: Clear/normal Other: None MRA HEAD FINDINGS Both internal carotid arteries show antegrade flow through the skull base. On the left, there is supply in the left MCA territory and both anterior cerebral artery territories. Moderate atherosclerotic irregularity of the MCA branches. On the right, there is signal loss related to the right MCA stent. More distal MCA branch vessels do show supply, with absence of  the superior division. Right vertebral artery terminates in PICA. Left vertebral artery supplies the basilar. 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 Left PCA arises from the basilar tip. Distal vessel atherosclerotic irregularity. Right PCA arises from the right carotid and shows poor supply of the distal branches. 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 Fetal origin right PCA with poor flow in the distal branch vessels. Acute infarction of the right insular region and frontal operculum.  Missing superior division right MCA. Small scattered other acute infarctions in the right frontal and parietal regions. Swelling but no mass effect. No intraparenchymal hematoma. Few small foci of petechial blood versus vascular susceptibility signal. Right MCA stent. Missing superior division as noted above. Inferior division shows flow. Electronically Signed   By: Nelson Chimes M.D.   On: 12/30/2019 19:21   MR BRAIN WO CONTRAST  Result Date: 12/30/2019 CLINICAL DATA:  Status post right M1 occlusion with intervention EXAM: MRI HEAD WITHOUT CONTRAST MRA HEAD WITHOUT CONTRAST TECHNIQUE: Multiplanar, multiecho pulse sequences of the brain and surrounding structures were obtained without intravenous contrast. Angiographic images of the head were obtained using MRA technique without contrast. COMPARISON:  Multiple CT studies done yesterday. FINDINGS: MRI HEAD FINDINGS Brain: Diffusion imaging shows acute infarction affecting the right occipital cortex. Acute infarction affects the right insular region and frontal operculum. Small foci of acute infarction elsewhere in the right frontal and parietal regions. Areas of infarction show mild swelling but there is no mass effect. Small focus of susceptibility in the right sylvian fissure region may be intravascular. Few small foci of susceptibility in the right parietal region also suspected to be intravascular. No sign of parenchymal hematoma. No hydrocephalus. No extra-axial collection. Mild chronic small-vessel ischemic changes elsewhere throughout the cerebral hemispheric white matter. 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 Vascular: Stent in the right M1 region. Skull and upper cervical spine: Negative Sinuses/Orbits: Clear/normal Other: None MRA HEAD FINDINGS Both internal carotid arteries show antegrade flow through the skull base. On the left, there is supply in the left MCA territory  and both anterior cerebral artery territories. Moderate atherosclerotic irregularity of the MCA branches. On the right, there is signal loss related to the right MCA stent. More distal MCA branch vessels do show supply, with absence of the superior division. Right vertebral artery terminates in PICA. Left vertebral artery supplies the basilar. 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 Left PCA arises from the basilar tip. Distal vessel atherosclerotic irregularity. Right PCA arises from the right carotid and shows poor supply of the distal branches. 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 Fetal origin right PCA with poor flow in the distal branch vessels. Acute infarction of the right insular region and frontal operculum. Missing superior division right MCA. Small scattered other acute infarctions in the right frontal and parietal regions. Swelling but no mass effect. No intraparenchymal hematoma. Few small foci of petechial blood versus vascular susceptibility signal. Right MCA stent. Missing superior division as noted above. Inferior division shows flow. Electronically Signed   By: Nelson Chimes M.D.   On: 12/30/2019 19:21   IR Intra Cran Stent  Result Date: 12/31/2019 INDICATION: New onset left-sided hemiplegia, right gaze deviation. Occluded right middle cerebral M1 segment on CT angiogram of the head and neck. EXAM: 1. EMERGENT LARGE VESSEL OCCLUSION THROMBOLYSIS (anterior CIRCULATION) COMPARISON:  CT angiogram of the head and neck of June, 27, 2021.  MEDICATIONS: Vancomycin 1 g IV was administered within 1 hour of the procedure. ANESTHESIA/SEDATION: General anesthesia CONTRAST:  Isovue 300 approximately 170 mL FLUOROSCOPY TIME:  Fluoroscopy Time: 131 minutes 12 seconds (4835 mGy). COMPLICATIONS: None immediate. TECHNIQUE: Following a full explanation of the procedure along with the potential associated complications, an informed witnessed consent was obtained the patient's  son. The risks of intracranial hemorrhage of 10%, worsening neurological deficit, ventilator dependency, death and inability to revascularize were all reviewed in detail with the patient's son. The patient was then put under general anesthesia by the Department of Anesthesiology at [facility]. The right groin was prepped and draped in the usual sterile fashion. Thereafter using modified Seldinger technique, transfemoral access into the right common femoral artery was obtained without difficulty. Over a 0.035 inch guidewire an 8 French 25 cm Pinnacle sheath was inserted. Through this, and also over a 0.035 inch guidewire a combination of a select 5 French 125 cm Simmons 2 catheter inside of a 95 cm 087 balloon guide catheter was advanced without difficulty to the right common carotid artery. The guidewire and the select catheter were removed. Good aspiration was obtained from the hub of the balloon guide catheter just proximal to the right common carotid artery bifurcation. Arteriogram was then performed centered extra cranially and intracranially. FINDINGS: The right common carotid arteriogram demonstrates the right external carotid artery and its major branches to be widely patent. The right internal carotid artery at the bulb demonstrates a smooth shallow plaque. Moderate tortuosity was noted of the proximal right internal carotid artery. 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 Distal to this the cervical petrous junction demonstrates normal opacification. Distal to this the petrous, cavernous, and the cavernous segments demonstrate patency as does the supraclinoid segment. Opacification is seen of the right posterior communicating artery opacifying the right posterior cerebral  distribution. The right middle cerebral artery demonstrates complete angiographic occlusion at the origin of the anterior temporal branch. Focal areas of caliber irregularity are seen of the proximal right middle cerebral artery. PROCEDURE: Through the 087 balloon guide catheter in the proximal right internal carotid artery, a Zoom 071 135 cm catheter inside of which was an 021 160 cm Phenom microcatheter was advanced over a 0.014 inch standard Synchro micro guidewire gingerly through the right internal carotid artery in the neck and into the supraclinoid right ICA. The micro guidewire was then gently manipulated with a torque device and advanced into the inferior division M2 M3 region of the right middle cerebral artery followed by the microcatheter. The guidewire was removed. Good aspiration was obtained from the hub of the microcatheter. Gentle contrast injection demonstrated safe position of tip of the microcatheter. A 5 mm x 37 mm Embotrap retrieval device was then advanced to the distal end of the microcatheter. The O ring on the delivery micro guidewire was loosened. With slight forward gentle traction with the right hand on the delivery micro guidewire, with the left hand the retrieval device was deployed. The 071 Zoom catheter was then advanced into the right middle cerebral artery engaging the occluded right middle cerebral artery. With constant flow arrest in the right internal carotid artery, and constant aspiration using a Penumbra device at the hub of the 071 Zoom catheter for approximately 3 minutes, the combination of the retrieval device, the  microcatheter, and the Zoom aspiration catheter were retrieved and removed. Following reversal of flow arrest, a control arteriogram performed through the balloon guide catheter in the right internal carotid artery demonstrates revascularization of the right middle cerebral artery and also of the superior and inferior divisions. The anterior temporal branch  remains patent. A TICI 2B revascularization was achieved. This also unmasked an irregular filling defect at the right middle cerebral artery terminus at the trifurcation region with flow noted in the distal inferior division. A second pass was then made again with the combination of the 071 Zoom catheter advanced over an 021 160 cm Phenom microcatheter over a 0.014 inch standard Synchro micro guidewire. Again access was obtained into the distal M2 M3 region of the inferior division with the micro guidewire then followed by the microcatheter. The guidewire was removed. Again safe position of the tip of the microcatheter was confirmed and connected to continuous heparinized saline infusion. A 4 mm x 40 mm Solitaire X retrieval device was then deployed in the manner described above. Again with proximal flow arrest in the right internal carotid artery proximally, and constant aspiration via the Zoom catheter which was now imbedded in the occluded right middle cerebral artery at its trifurcation region over approximately 2-1/2 minutes, the combination of the retrieval device, the microcatheter, and the Zoom catheter was retrieved and removed. Following reversal of flow arrest, a control arteriogram performed through the right internal carotid artery demonstrated improved opacification of the right MCA trifurcation branches. There continued be occluded superior division. The combination of the microcatheter, inside a 071 Zoom aspiration catheter was again advanced to the right middle cerebral artery over a 0.014 inch standard Synchro micro guidewire, which was now advanced into the superior division of the right middle cerebral artery emanating at the MCA trifurcation region. The microcatheter was advanced to the M2 region over a micro guidewire. The micro guidewire was removed. Good aspiration obtained from the hub of the microcatheter. Again with flow arrest in the right internal carotid artery proximally, and constant  aspiration with the Penumbra aspiration device at the hub of the aspiration catheter following deployment of a 3 mm x 20 mm Solitaire X retrieval device, for 2 minutes, the combination of the retrieval device, the microcatheter and the Zoom catheter were retrieved and removed. The superior division continued to be occluded. The superior division was again cannulated with a microcatheter over a micro guidewire as described above. After having verified safe position of the tip of the microcatheter, in the superior division M2 region, a 4 mm x 40 mm Solitaire X retrieval device was deployed in the usual manner. Again with proximal flow arrest in the proximal right ICA, and constant aspiration at the hub of the Zoom catheter at the site of the occluded superior division in the distal right middle cerebral M1 segment over 2 minutes, the combination of the retrieval device, the microcatheter and the retrieval device were retrieved and removed. Following reverse of flow arrest, there appeared to be modest improved flow in the proximal superior division. It was now noted the inferior division had a near occlusive clot in the proximal aspect. A fourth pass was then made this time using a combination of the Phenom microcatheter, inside of a 6 [place] Catalyst 135 cm catheter advanced over a 0.014 inch standard Synchro micro guidewire to the distal right M1 segment. The micro guidewire was then advanced into the M2 M3 region of the inferior division followed by the microcatheter. The guidewire  was removed. A 4 mm x 40 mm Solitaire X retrieval device was deployed with the Catalyst guide catheter advanced at the origin of inferior division and distal to this. Following proximal flow arrest and constant aspiration for approximately 2 minutes at the hub of the Catalyst guide catheter, the combination of the retrieval device, the microcatheter and the Catalyst catheter were retrieved and removed following reversal of flow arrest.  Improved flow was now noted into the inferior division proximally through the two main branch points. Following each thrombectomy, strips of fragments were seen intertwined either in the retrieval device, or in the aspiration catheters. 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 It was therefore decided to proceed with placement of a rescue stent in order to prevent occlusion of the major right MCA inferior division branch. The microcatheter was again advanced over a 0.014 inch standard Synchro micro guidewire with a 6 [place] Catalyst guide catheter. The microcatheter was advanced over a micro guidewire without difficulty into the M2 region followed by the removal of the micro guidewire. Again good aspiration obtained from the hub of the microcatheter. This was then connected to continuous heparinized saline infusion. Measurements performed of the right middle cerebral artery in the mid M1 segment, and also the proximal inferior division distal to the severe stenosis. A 4 mm x 24 mm Neuroform Atlas stent was advanced to the distal end of the microcatheter. The O ring on the delivery microcatheter was then loosened. With slight forward gentle traction with the right hand on the delivery micro guidewire with left hand the delivery microcatheter was gently retrieved unsheathing the distal and then the proximal portion of the stent with excellent coverage at the site of the severe stenosis. The delivery apparatus was removed. A control arteriogram performed through the balloon guide in the distal cervical ICA on the right demonstrated now significantly improved caliber and flow through the right middle cerebral artery the dominant inferior division. There was poor stagnant flow noted in the proximal superior division. Free flow through the stented segment and also the other branch of the right middle cerebral artery except for the superior  division. 8 mg of Integrilin was given intra-arterially in order to prevent intra stent platelet aggregation. A 10 minute post stent arteriogram continued to demonstrate flow through the stented segment in the right MCA distribution except for the non dominant superior division. A TICI 2b revascularization was maintained. The right posterior communicating artery with the right posterior cerebral artery distribution remained unchanged. Throughout the procedure, the patient's blood pressure and neurological status remained stable. A CT of the brain performed following the third pass demonstrated no mass-effect or midline shift with mild element of contrast in the right perisylvian region. The balloon guide was then retrieved and removed. The 8 French 25 cm Pinnacle sheath was then exchanged over a 0.035 inch J-tip guidewire for a short 8 [place] Pinnacle sheath. An arteriogram performed through this demonstrated spasm in the previously noted tortuous right common iliac artery. There was small amount of contrast noted in the soft tissue at the site of entry of the 8 [place] Pinnacle sheath which completely disappeared on repeat arteriogram performed approximately 3 minutes later. The 8 French sheath was removed and a 7 [place] ExoSeal closure device was placed. Also minimal compression was held for approximately 25 minutes. The distal pulses remained Dopplerable in the dorsalis pedis, and the posterior tibial regions bilaterally. Patient was left intubated on account  of the patient's inability to communicate due to difficulty hearing and also her neurological condition. During this time, while pressure was held, and firmness was felt in the region of the pannus on the right side in the right lower quadrant, and also in the right inguinal region. In view of the unstable blood pressure, the patient was sent to CT scan for CT of the pelvis and also of the abdomen and also CT of the brain. CT of the brain revealed no evidence  of mass effect or midline shift with no significant change in the right perisylvian hyperattenuation felt to be due to a contrast stain. CT of the abdomen and pelvis revealed no evidence of the overlying skin tightness. Hemodynamically the patient was now stable with blood pressure in the 150s over 80s and the heart rate being regular in the 80s to 90s sinus rhythm. Patient was then transferred to the neuro ICU intubated for post thrombectomy management. IMPRESSION: Status post endovascular revascularization of occluded right middle cerebral M1 segment with 4 passes with different stent retrievers and Penumbra aspiration achieving a TICI 2B revascularization. Status post rescue stent placement from the distal M1 segment into the proximal dominant inferior division of the right middle cerebral artery due to underlying severe intracranial arteriosclerosis. PLAN: Follow-up in the clinic 4 weeks post discharge. Electronically Signed   By: Luanne Bras M.D.   On: 12/30/2019 17:15   IR CT Head [facility]  Result Date: 12/31/2019 INDICATION: New onset left-sided hemiplegia, right gaze deviation. Occluded right middle cerebral M1 segment on CT angiogram of the head and neck. EXAM: 1. EMERGENT LARGE VESSEL OCCLUSION THROMBOLYSIS (anterior CIRCULATION) COMPARISON:  CT angiogram of the head and neck of June, 27, 2021. MEDICATIONS: Vancomycin 1 g IV was administered within 1 hour of the procedure. ANESTHESIA/SEDATION: General anesthesia CONTRAST:  Isovue 300 approximately 170 mL FLUOROSCOPY TIME:  Fluoroscopy Time: 131 minutes 12 seconds (4835 mGy). COMPLICATIONS: None immediate. TECHNIQUE: Following a full explanation of the procedure along with the potential associated complications, an informed witnessed consent was obtained the patient's son. The risks of intracranial hemorrhage of 10%, worsening neurological deficit, ventilator dependency, death and inability to revascularize were all reviewed in detail with the  patient's son. The patient was then put under general anesthesia by the Department of Anesthesiology at [facility]. The right groin was prepped and draped in the usual sterile fashion. Thereafter using modified Seldinger technique, transfemoral access into the right common femoral artery was obtained without difficulty. Over a 0.035 inch guidewire an 8 French 25 cm Pinnacle sheath was inserted. Through this, and also over a 0.035 inch guidewire a combination of a select 5 French 125 cm Simmons 2 catheter inside of a 95 cm 087 balloon guide catheter was advanced without difficulty to the right common carotid artery. The guidewire and the select catheter were removed. Good aspiration was obtained from the hub of the balloon guide catheter just proximal to the right common carotid artery bifurcation. Arteriogram was then performed centered extra cranially and intracranially. FINDINGS: The right common carotid arteriogram demonstrates the right external carotid artery and its major branches to be widely patent. The right internal carotid artery at the bulb demonstrates a smooth shallow plaque. Moderate tortuosity was noted of the proximal right internal carotid artery. 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 Distal to this the cervical petrous junction demonstrates normal opacification. Distal to this the petrous, cavernous, and the cavernous segments demonstrate patency as does the supraclinoid segment. Opacification is seen of the right posterior communicating artery opacifying the right posterior cerebral distribution. The right middle cerebral artery demonstrates complete angiographic occlusion at the origin of the anterior temporal branch. Focal areas of caliber irregularity are  seen of the proximal right middle cerebral artery. PROCEDURE: Through the 087 balloon guide catheter in the proximal right internal carotid artery, a Zoom 071 135 cm catheter inside of which was an 021 160 cm Phenom microcatheter was advanced over a 0.014 inch standard Synchro micro guidewire gingerly through the right internal carotid artery in the neck and into the supraclinoid right ICA. The micro guidewire was then gently manipulated with a torque device and advanced into the inferior division M2 M3 region of the right middle cerebral artery followed by the microcatheter. The guidewire was removed. Good aspiration was obtained from the hub of the microcatheter. Gentle contrast injection demonstrated safe position of tip of the microcatheter. A 5 mm x 37 mm Embotrap retrieval device was then advanced to the distal end of the microcatheter. The O ring on the delivery micro guidewire was loosened. With slight forward gentle traction with the right hand on the delivery micro guidewire, with the left hand the retrieval device was deployed. The 071 Zoom catheter was then advanced into the right middle cerebral artery engaging the occluded right middle cerebral artery. With constant flow arrest in the right internal carotid artery, and constant aspiration using a Penumbra device at the hub of the 071 Zoom catheter for approximately 3 minutes, the combination of the retrieval device, the microcatheter, and the Zoom aspiration catheter were retrieved and removed. Following reversal of flow arrest, a control arteriogram performed through the balloon guide catheter in the right internal carotid artery demonstrates revascularization of the right middle cerebral artery and also of the superior and inferior divisions. The anterior temporal branch remains patent. A TICI 2B revascularization was achieved. This also unmasked an irregular filling defect at the right middle cerebral artery terminus at the trifurcation region with  flow noted in the distal inferior division. A second pass was then made again with the combination of the 071 Zoom catheter advanced over an 021 160 cm Phenom microcatheter over a 0.014 inch standard Synchro micro guidewire. Again access was obtained into the distal M2 M3 region of the inferior division with the micro guidewire then followed by the microcatheter. The guidewire was removed. Again safe position of the tip of the microcatheter was confirmed and connected to continuous heparinized saline infusion. A 4 mm x 40 mm Solitaire X retrieval device was then deployed in the manner described above. Again with proximal flow arrest in the right internal carotid artery proximally, and constant aspiration via the Zoom catheter which was now imbedded in the occluded right middle cerebral artery at its trifurcation region over approximately 2-1/2 minutes, the combination of the retrieval device, the microcatheter, and the Zoom catheter was retrieved and removed. Following reversal of flow arrest, a control arteriogram performed through the right internal carotid artery demonstrated improved opacification of the right MCA trifurcation branches. There continued be occluded superior division. The combination of the microcatheter, inside a 071 Zoom aspiration catheter was again advanced to the right middle cerebral artery over a 0.014 inch standard Synchro micro guidewire, which was now advanced into the superior division of the right middle cerebral artery  emanating at the MCA trifurcation region. The microcatheter was advanced to the M2 region over a micro guidewire. The micro guidewire was removed. Good aspiration obtained from the hub of the microcatheter. Again with flow arrest in the right internal carotid artery proximally, and constant aspiration with the Penumbra aspiration device at the hub of the aspiration catheter following deployment of a 3 mm x 20 mm Solitaire X retrieval device, for 2 minutes, the  combination of the retrieval device, the microcatheter and the Zoom catheter were retrieved and removed. The superior division continued to be occluded. The superior division was again cannulated with a microcatheter over a micro guidewire as described above. After having verified safe position of the tip of the microcatheter, in the superior division M2 region, a 4 mm x 40 mm Solitaire X retrieval device was deployed in the usual manner. Again with proximal flow arrest in the proximal right ICA, and constant aspiration at the hub of the Zoom catheter at the site of the occluded superior division in the distal right middle cerebral M1 segment over 2 minutes, the combination of the retrieval device, the microcatheter and the retrieval device were retrieved and removed. Following reverse of flow arrest, there appeared to be modest improved flow in the proximal superior division. It was now noted the inferior division had a near occlusive clot in the proximal aspect. A fourth pass was then made this time using a combination of the Phenom microcatheter, inside of a 6 [place] Catalyst 135 cm catheter advanced over a 0.014 inch standard Synchro micro guidewire to the distal right M1 segment. The micro guidewire was then advanced into the M2 M3 region of the inferior division followed by the microcatheter. The guidewire was removed. A 4 mm x 40 mm Solitaire X retrieval device was deployed with the Catalyst guide catheter advanced at the origin of inferior division and distal to this. Following proximal flow arrest and constant aspiration for approximately 2 minutes at the hub of the Catalyst guide catheter, the combination of the retrieval device, the microcatheter and the Catalyst catheter were retrieved and removed following reversal of flow arrest. Improved flow was now noted into the inferior division proximally through the two main branch points. Following each thrombectomy, strips of fragments were seen intertwined  either in the retrieval device, or in the aspiration catheters. 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 It was therefore decided to proceed with placement of a rescue stent in order to prevent occlusion of the major right MCA inferior division branch. The microcatheter was again advanced over a 0.014 inch standard Synchro micro guidewire with a 6 [place] Catalyst guide catheter. The microcatheter was advanced over a micro guidewire without difficulty into the M2 region followed by the removal of the micro guidewire. Again good aspiration obtained from the hub of the microcatheter. This was then connected to continuous heparinized saline infusion. Measurements performed of the right middle cerebral artery in the mid M1 segment, and also the proximal inferior division distal to the severe stenosis. A 4 mm x 24 mm Neuroform Atlas stent was advanced to the distal end of the microcatheter. The O ring on the delivery microcatheter was then loosened. With slight forward gentle traction with the right hand on the delivery micro guidewire with left hand the delivery microcatheter was gently retrieved unsheathing the distal and then the proximal portion of the stent with excellent coverage at the site of the severe stenosis. The  delivery apparatus was removed. A control arteriogram performed through the balloon guide in the distal cervical ICA on the right demonstrated now significantly improved caliber and flow through the right middle cerebral artery the dominant inferior division. There was poor stagnant flow noted in the proximal superior division. Free flow through the stented segment and also the other branch of the right middle cerebral artery except for the superior division. 8 mg of Integrilin was given intra-arterially in order to prevent intra stent platelet aggregation. A 10 minute post stent arteriogram continued to demonstrate flow  through the stented segment in the right MCA distribution except for the non dominant superior division. A TICI 2b revascularization was maintained. The right posterior communicating artery with the right posterior cerebral artery distribution remained unchanged. Throughout the procedure, the patient's blood pressure and neurological status remained stable. A CT of the brain performed following the third pass demonstrated no mass-effect or midline shift with mild element of contrast in the right perisylvian region. The balloon guide was then retrieved and removed. The 8 French 25 cm Pinnacle sheath was then exchanged over a 0.035 inch J-tip guidewire for a short 8 [place] Pinnacle sheath. An arteriogram performed through this demonstrated spasm in the previously noted tortuous right common iliac artery. There was small amount of contrast noted in the soft tissue at the site of entry of the 8 [place] Pinnacle sheath which completely disappeared on repeat arteriogram performed approximately 3 minutes later. The 8 French sheath was removed and a 7 [place] ExoSeal closure device was placed. Also minimal compression was held for approximately 25 minutes. The distal pulses remained Dopplerable in the dorsalis pedis, and the posterior tibial regions bilaterally. Patient was left intubated on account of the patient's inability to communicate due to difficulty hearing and also her neurological condition. During this time, while pressure was held, and firmness was felt in the region of the pannus on the right side in the right lower quadrant, and also in the right inguinal region. In view of the unstable blood pressure, the patient was sent to CT scan for CT of the pelvis and also of the abdomen and also CT of the brain. CT of the brain revealed no evidence of mass effect or midline shift with no significant change in the right perisylvian hyperattenuation felt to be due to a contrast stain. CT of the abdomen and pelvis revealed  no evidence of the overlying skin tightness. Hemodynamically the patient was now stable with blood pressure in the 150s over 80s and the heart rate being regular in the 80s to 90s sinus rhythm. Patient was then transferred to the neuro ICU intubated for post thrombectomy management. IMPRESSION: Status post endovascular revascularization of occluded right middle cerebral M1 segment with 4 passes with different stent retrievers and Penumbra aspiration achieving a TICI 2B revascularization. Status post rescue stent placement from the distal M1 segment into the proximal dominant inferior division of the right middle cerebral artery due to underlying severe intracranial arteriosclerosis. PLAN: Follow-up in the clinic 4 weeks post discharge. Electronically Signed   By: Luanne Bras M.D.   On: 12/30/2019 17:15   DG CHEST PORT 1 VIEW  Result Date: 12/31/2019 CLINICAL DATA:  Status post extubation EXAM: PORTABLE CHEST 1 VIEW COMPARISON:  12/30/2019 FINDINGS: Cardiac shadow remains enlarged. Aortic calcifications are again seen. Feeding catheter is noted extending into the stomach. Endotracheal tube has been removed in the interval. The lungs are well aerated without focal infiltrate or sizable effusion. IMPRESSION: Status  post extubation. No acute abnormality noted. Electronically Signed   By: Inez Catalina M.D.   On: 12/31/2019 11:27   DG Chest Port 1 View  Result Date: 12/30/2019 CLINICAL DATA:  Intubation. EXAM: PORTABLE CHEST 1 VIEW COMPARISON:  Radiograph 11/08/2019. FINDINGS: Low positioning of the endotracheal tube 6 mm from the carina. Enteric tube in place with tip below the diaphragm not included in this chest field of view. Stable cardiomegaly. Unchanged mediastinal contours. Interstitial coarsening which appears chronic. No focal airspace disease, pleural effusion, or pneumothorax. No acute osseous abnormalities are seen. IMPRESSION: 1. Low positioning of the endotracheal tube 6 mm from the carina.  Retraction of 2-3 cm recommended. 2. Enteric tube in place with tip below the diaphragm. 3. Stable cardiomegaly and chronic interstitial coarsening. Electronically Signed   By: Keith Rake M.D.   On: 12/30/2019 01:11   DG Knee Left Port  Result Date: 01/02/2020 CLINICAL DATA:  Left knee pain. EXAM: PORTABLE LEFT KNEE - 1-2 VIEW COMPARISON:  None. FINDINGS: No evidence of fracture, dislocation, or joint effusion. Severe degenerative changes seen involving the medial joint space. Moderate degenerative changes seen involving the lateral joint space. Severe degenerative changes seen involving the patellofemoral space. Soft tissues are unremarkable. IMPRESSION: Severe tricompartmental osteoarthritis. No acute abnormality seen in the left knee. Electronically Signed   By: Marijo Conception M.D.   On: 01/02/2020 14:59   DG ABD ACUTE 2+V W 1V CHEST  Result Date: 01/08/2020 CLINICAL DATA:  Follow-up ileus.  Diarrhea. EXAM: DG ABDOMEN ACUTE W/ 1V CHEST COMPARISON:  Abdominal x-rays 01/07/2020 and earlier, including CT abdomen and pelvis 12/29/2019. No prior chest x-ray. FINDINGS: Moderate gaseous distension of the ascending and transverse colon, not significantly changed since yesterday. The gaseous distension of the small bowel has improved. No evidence of free intraperitoneal air on the [employer]. Nasogastric tube tip projects over the expected location of the mid stomach, unchanged. Contrast material in sigmoid colon diverticula is again noted. Cardiac silhouette mildly to moderately enlarged for AP technique. Moderate central peribronchial thickening. Lungs otherwise clear. Mild pulmonary venous hypertension without overt edema. IMPRESSION: 1. Stable moderate colonic ileus. Improved small bowel ileus. No free intraperitoneal air. 2. Stable cardiomegaly. Mild pulmonary venous hypertension without overt edema. Moderate changes of bronchitis and/or asthma without focal airspace pneumonia. Electronically Signed    By: Evangeline Dakin M.D.   On: 01/08/2020 11:14   DG Abd Portable 1V  Result Date: 01/07/2020 CLINICAL DATA:  History of cholecystectomy. History of cystoscopy and stent placement. EXAM: PORTABLE ABDOMEN - 1 VIEW COMPARISON:  01/04/2020. FINDINGS: Hemidiaphragms not imaged. Feeding tube noted with tip over the stomach. Distended loops of small and large bowel again noted without interim change. Findings consistent with adynamic ileus. Oral contrast in the right colon. IMPRESSION: 1.  Feeding tube noted with tip over the stomach. 2. Distended loops of small large bowel again noted without interim change. Findings consistent adynamic ileus. Continued follow-up exam suggested to demonstrate resolution and to exclude bowel obstruction. Electronically Signed   By: Marcello Moores  Register   On: 01/07/2020 07:13   DG Abd Portable 1V  Result Date: 01/04/2020 CLINICAL DATA:  Abdominal distension EXAM: PORTABLE ABDOMEN - 1 VIEW COMPARISON:  CT abdomen pelvis dated 12/30/2019 FINDINGS: An enteric tube terminates in the stomach. Previously seen nephroureteral stents are no longer identified. Cholecystectomy clips are noted. Enteric contrast is seen in the a cecum and ascending colon. A dilated loop of small bowel overlies the left lower abdomen. Air-fluid  levels and free intraperitoneal air cannot be excluded on the supine exam. Degenerative changes are seen in the hips and spine. IMPRESSION: 1. Dilated loop of small bowel in the left lower abdomen. This is nonspecific for ileus or small-bowel obstruction on this supine exam. Electronically Signed   By: Zerita Boers M.D.   On: 01/04/2020 17:06   DG Abd Portable 1V  Result Date: 12/30/2019 CLINICAL DATA:  OG tube placement. EXAM: PORTABLE ABDOMEN - 1 VIEW COMPARISON:  Abdomen pelvis CT yesterday. FINDINGS: Tip and side port of the enteric tube below the diaphragm in the stomach. Excreted IV contrast within both renal collecting systems and urinary bladder. Bilateral  ureteral stents in place. Nephrograms demonstrate mild hydronephrosis. Nonobstructive bowel gas pattern. IMPRESSION: 1. Tip and side port of the enteric tube below the diaphragm in the stomach. 2. Bilateral ureteral stents in place. Excreted IV contrast in both renal collecting systems in the urinary bladder with mild bilateral hydronephrosis. Electronically Signed   By: Keith Rake M.D.   On: 12/30/2019 01:13   DG Swallowing Func-Speech Pathology  Result Date: 01/03/2020 Objective Swallowing Evaluation: Type of Study: MBS-Modified Barium Swallow Study  Patient Details Name: Gloria [name] MRN: [id number] Date of Birth: 09-01-1945 Today's Date: 01/03/2020 Time: SLP Start Time (ACUTE ONLY): 1106 -SLP Stop Time (ACUTE ONLY): 3154 SLP Time Calculation (min) (ACUTE ONLY): 26 min Past Medical History: Past Medical History: Diagnosis Date . Arthritis   knees . Atrial fibrillation ([place])  . Dysrhythmia  . History of kidney stones  . HOH (hard of hearing)  . Hypertension  Past Surgical History: Past Surgical History: Procedure Laterality Date . CHOLECYSTECTOMY   . CYSTOSCOPY WITH STENT PLACEMENT Bilateral 11/09/2019  Procedure: CYSTOSCOPY BILATERAL URETERAL STENT PLACEMENT;  Surgeon: Ardis Hughs, MD;  Location: [facility];  Service: Urology;  Laterality: Bilateral; . CYSTOSCOPY/URETEROSCOPY/HOLMIUM LASER/STENT PLACEMENT Right 11/22/2019  Procedure: CYSTOSCOPY RIGHT URETEROSCOPY/HOLMIUM LASER STONE EXTRACTION /STENT EXCHANGE;  Surgeon: Ardis Hughs, MD;  Location: [facility];  Service: Urology;  Laterality: Right; . CYSTOSCOPY/URETEROSCOPY/HOLMIUM LASER/STENT PLACEMENT Left 12/06/2019  Procedure: CYSTOSCOPY BILATERAL URETEROSCOPY/HOLMIUM LASER/STENT EXCHANGE;  Surgeon: Ardis Hughs, MD;  Location: [facility];  Service: Urology;  Laterality: Left; . CYSTOSCOPY/URETEROSCOPY/HOLMIUM LASER/STENT PLACEMENT Bilateral 12/25/2019  Procedure: BILATERAL URETEROSCOPY/HOLMIUM LASER LEFT, BILATERAL STONE EXTRACTION BILATERAL  STENT EXCHANGE;  Surgeon: Ardis Hughs, MD;  Location: [facility];  Service: Urology;  Laterality: Bilateral; . IR CT HEAD LTD  12/30/2019 . IR INTRA CRAN STENT  12/30/2019 . IR PERCUTANEOUS ART THROMBECTOMY/INFUSION INTRACRANIAL INC DIAG ANGIO  12/30/2019 . RADIOLOGY WITH ANESTHESIA N/A 12/29/2019  Procedure: IR WITH ANESTHESIA;  Surgeon: Radiologist, Medication, MD;  Location: [facility];  Service: Radiology;  Laterality: N/A; HPI: 74 y.o. female admitted with flaccid Lt side and Rt gaze preference.  Pt received tPA.  CTA revealed Rt M1 occlusion and she was taken for emergent thrombectomy.  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  She was extubated 12/31/2019. PMH:  Pt is very HOH - reads lips, HTN, h/o kidney stones, A-Fib; s/p multiple cystocopies with stent placement.   Subjective: alert Assessment / Plan / Recommendation CHL IP CLINICAL IMPRESSIONS 01/03/2020 Clinical Impression Pt presents with a moderate oral and more mild pharyngeal dysphagia. She has decreased bolus cohesion and oral clearance across consistencies. There is anterior loss of solids, unmasticated and without evidence of awareness. Labial seal  with liquids is best via straw. Thin liquids spill prematurely into the pharynx and sit briefly in the larynx and pyriform sinuses before a swallow is triggered. Aspiration occurs intermittently and silently, and cues to cough are difficult given her hearing loss despite use of clear masks and glasses for lipreading and visual cues. She initially only has penetration while using a chin tuck, but since it cannot be cleared to command, this ultimately results in aspiration as well. She has improved oral control and containment at the valleculae with nectar thick liquids and purees. Otherwise, motorically, her pharyngeal phase is functional. Recommend starting Dys 1  (pureed) solids and nectar thick liquids via straw.  SLP Visit Diagnosis Dysphagia, oropharyngeal phase (R13.12) Attention and concentration deficit following -- Frontal lobe and executive function deficit following -- Impact on safety and function Mild aspiration risk;Moderate aspiration risk   CHL IP TREATMENT RECOMMENDATION 01/03/2020 Treatment Recommendations Therapy as outlined in treatment plan below   Prognosis 01/03/2020 Prognosis for Safe Diet Advancement Good Barriers to Reach Goals Cognitive deficits Barriers/Prognosis Comment -- CHL IP DIET RECOMMENDATION 01/03/2020 SLP Diet Recommendations Dysphagia 1 (Puree) solids;Nectar thick liquid Liquid Administration via Straw Medication Administration Crushed with puree Compensations Minimize environmental distractions;Slow rate;Small sips/bites Postural Changes Seated upright at 90 degrees;Remain semi-upright after after feeds/meals (Comment)   CHL IP OTHER RECOMMENDATIONS 01/03/2020 Recommended Consults -- Oral Care Recommendations Oral care BID Other Recommendations Order thickener from pharmacy;Prohibited food (jello, ice cream, thin soups);Remove water pitcher;Have oral suction available   CHL IP FOLLOW UP RECOMMENDATIONS 01/03/2020 Follow up Recommendations Inpatient Rehab   CHL IP FREQUENCY AND DURATION 01/03/2020 Speech Therapy Frequency (ACUTE ONLY) min 2x/week Treatment Duration 2 weeks      CHL IP ORAL PHASE 01/03/2020 Oral Phase Impaired Oral - Pudding Teaspoon -- Oral - Pudding Cup -- Oral - Honey Teaspoon -- Oral - Honey Cup -- Oral - Nectar Teaspoon -- Oral - Nectar Cup -- Oral - Nectar Straw Decreased bolus cohesion;Reduced posterior propulsion;Lingual/palatal residue Oral - Thin Teaspoon Decreased bolus cohesion;Reduced posterior propulsion;Premature spillage Oral - Thin Cup -- Oral - Thin Straw Decreased bolus cohesion;Reduced posterior propulsion;Lingual/palatal residue;Premature spillage Oral - Puree Decreased bolus cohesion;Reduced posterior  propulsion;Lingual/palatal residue Oral - Mech Soft Decreased bolus cohesion;Reduced posterior propulsion;Lingual/palatal residue;Left anterior bolus loss Oral - Regular -- Oral - Multi-Consistency -- Oral - Pill -- Oral Phase - Comment --  CHL IP PHARYNGEAL PHASE 01/03/2020 Pharyngeal Phase Impaired Pharyngeal- Pudding Teaspoon -- Pharyngeal -- Pharyngeal- Pudding Cup -- Pharyngeal -- Pharyngeal- Honey Teaspoon -- Pharyngeal -- Pharyngeal- Honey Cup -- Pharyngeal -- Pharyngeal- Nectar Teaspoon -- Pharyngeal -- Pharyngeal- Nectar Cup -- Pharyngeal -- Pharyngeal- Nectar Straw Delayed swallow initiation-vallecula Pharyngeal -- Pharyngeal- Thin Teaspoon Delayed swallow initiation-vallecula Pharyngeal -- Pharyngeal- Thin Cup -- Pharyngeal -- Pharyngeal- Thin Straw Delayed swallow initiation-pyriform sinuses;Penetration/Aspiration before swallow Pharyngeal Material enters airway, passes BELOW cords without attempt by patient to eject out (silent aspiration) Pharyngeal- Puree Delayed swallow initiation-vallecula Pharyngeal -- Pharyngeal- Mechanical Soft -- Pharyngeal -- Pharyngeal- Regular -- Pharyngeal -- Pharyngeal- Multi-consistency -- Pharyngeal -- Pharyngeal- Pill -- Pharyngeal -- Pharyngeal Comment --  CHL IP CERVICAL ESOPHAGEAL PHASE 01/03/2020 Cervical Esophageal Phase WFL Pudding Teaspoon -- Pudding Cup -- Honey Teaspoon -- Honey Cup -- Nectar Teaspoon -- Nectar Cup -- Nectar Straw -- Thin Teaspoon -- Thin Cup -- Thin Straw -- Puree -- Mechanical Soft -- Regular -- Multi-consistency -- Pill -- Cervical Esophageal Comment -- Osie Bond., M.A. [facility] Pager [phone] Office [phone] 01/03/2020, 12:39 PM  DG C-Arm 1-60 Min-No Report  Result Date: 12/25/2019 Fluoroscopy was utilized by the requesting physician.  No radiographic interpretation.   VAS [place] GROIN PSEUDOANEURYSM  Result Date: 01/01/2020  ARTERIAL PSEUDOANEURYSM  Exam: Right groin Indications: Patient  complains of bruising and drop in hemoglobin. History: S/p catheterization. Comparison Study: 12/30/19 negative pseudoaneurysm/ hematoma Performing Technologist: June Leap RDMS, RVT  Examination Guidelines: A complete evaluation includes B-mode imaging, spectral Doppler, color Doppler, and power Doppler as needed of all accessible portions of each vessel. Bilateral testing is considered an integral part of a complete examination. Limited examinations for reoccurring indications may be performed as noted. +------------+----------+---------+------+----------+ Right DuplexPSV (cm/s)Waveform PlaqueComment(s) +------------+----------+---------+------+----------+ CFA            140    triphasic                 +------------+----------+---------+------+----------+ Right Vein comments:patent CFV  Findings: No hematoma visualized  Summary: No evidence of pseudoaneurysm, AVF or DVT  Diagnosing physician: Harold Barban MD Electronically signed by Harold Barban MD on 01/01/2020 at 8:52:29 PM.   --------------------------------------------------------------------------------    Final    VAS [place] GROIN PSEUDOANEURYSM  Result Date: 12/31/2019  ARTERIAL PSEUDOANEURYSM  Exam: Right groin Indications: Patient complains of bruising. Comparison Study: no prior Performing Technologist: Abram Sander RVS  Examination Guidelines: A complete evaluation includes B-mode imaging, spectral Doppler, color Doppler, and power Doppler as needed of all accessible portions of each vessel. Bilateral testing is considered an integral part of a complete examination. Limited examinations for reoccurring indications may be performed as noted. +------------+----------+--------+------+----------+ Right DuplexPSV (cm/s)WaveformPlaqueComment(s) +------------+----------+--------+------+----------+ CFA            147    biphasic                 +------------+----------+--------+------+----------+ Prox SFA       146    biphasic                  +------------+----------+--------+------+----------+  Summary: No evidence of pseudoaneurysm, AVF or DVT  Diagnosing physician: Deitra Mayo MD Electronically signed by Deitra Mayo MD on 12/31/2019 at 8:54:10 AM.   --------------------------------------------------------------------------------    Final    IR PERCUTANEOUS ART THROMBECTOMY/INFUSION INTRACRANIAL INC DIAG ANGIO  Result Date: 12/31/2019 INDICATION: New onset left-sided hemiplegia, right gaze deviation. Occluded right middle cerebral M1 segment on CT angiogram of the head and neck. EXAM: 1. EMERGENT LARGE VESSEL OCCLUSION THROMBOLYSIS (anterior CIRCULATION) COMPARISON:  CT angiogram of the head and neck of June, 27, 2021. MEDICATIONS: Vancomycin 1 g IV was administered within 1 hour of the procedure. ANESTHESIA/SEDATION: General anesthesia CONTRAST:  Isovue 300 approximately 170 mL FLUOROSCOPY TIME:  Fluoroscopy Time: 131 minutes 12 seconds (4835 mGy). COMPLICATIONS: None immediate. TECHNIQUE: Following a full explanation of the procedure along with the potential associated complications, an informed witnessed consent was obtained the patient's son. The risks of intracranial hemorrhage of 10%, worsening neurological deficit, ventilator dependency, death and inability to revascularize were all reviewed in detail with the patient's son. The patient was then put under general anesthesia by the Department of Anesthesiology at [facility]. The right groin was prepped and draped in the usual sterile fashion. Thereafter using modified Seldinger technique, transfemoral access into the right common femoral artery was obtained without difficulty. Over a 0.035 inch guidewire an 8 French 25 cm Pinnacle sheath was inserted. Through this, and also over a 0.035 inch guidewire a combination of a select 5 French 125 cm Simmons 2 catheter inside of a  95 cm 087 balloon guide catheter was advanced without difficulty to the right common  carotid artery. The guidewire and the select catheter were removed. Good aspiration was obtained from the hub of the balloon guide catheter just proximal to the right common carotid artery bifurcation. Arteriogram was then performed centered extra cranially and intracranially. FINDINGS: The right common carotid arteriogram demonstrates the right external carotid artery and its major branches to be widely patent. The right internal carotid artery at the bulb demonstrates a smooth shallow plaque. Moderate tortuosity was noted of the proximal right internal carotid artery. 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 Distal to this the cervical petrous junction demonstrates normal opacification. Distal to this the petrous, cavernous, and the cavernous segments demonstrate patency as does the supraclinoid segment. Opacification is seen of the right posterior communicating artery opacifying the right posterior cerebral distribution. The right middle cerebral artery demonstrates complete angiographic occlusion at the origin of the anterior temporal branch. Focal areas of caliber irregularity are seen of the proximal right middle cerebral artery. PROCEDURE: Through the 087 balloon guide catheter in the proximal right internal carotid artery, a Zoom 071 135 cm catheter inside of which was an 021 160 cm Phenom microcatheter was advanced over a 0.014 inch standard Synchro micro guidewire gingerly through the right internal carotid artery in the neck and into the supraclinoid right ICA. The micro guidewire was then gently manipulated with a torque device and advanced into the inferior division M2 M3 region of the right middle cerebral artery followed by the microcatheter. The guidewire was removed. Good  aspiration was obtained from the hub of the microcatheter. Gentle contrast injection demonstrated safe position of tip of the microcatheter. A 5 mm x 37 mm Embotrap retrieval device was then advanced to the distal end of the microcatheter. The O ring on the delivery micro guidewire was loosened. With slight forward gentle traction with the right hand on the delivery micro guidewire, with the left hand the retrieval device was deployed. The 071 Zoom catheter was then advanced into the right middle cerebral artery engaging the occluded right middle cerebral artery. With constant flow arrest in the right internal carotid artery, and constant aspiration using a Penumbra device at the hub of the 071 Zoom catheter for approximately 3 minutes, the combination of the retrieval device, the microcatheter, and the Zoom aspiration catheter were retrieved and removed. Following reversal of flow arrest, a control arteriogram performed through the balloon guide catheter in the right internal carotid artery demonstrates revascularization of the right middle cerebral artery and also of the superior and inferior divisions. The anterior temporal branch remains patent. A TICI 2B revascularization was achieved. This also unmasked an irregular filling defect at the right middle cerebral artery terminus at the trifurcation region with flow noted in the distal inferior division. A second pass was then made again with the combination of the 071 Zoom catheter advanced over an 021 160 cm Phenom microcatheter over a 0.014 inch standard Synchro micro guidewire. Again access was obtained into the distal M2 M3 region of the inferior division with the micro guidewire then followed by the microcatheter. The guidewire was removed. Again safe position of the tip of the microcatheter was confirmed and connected to continuous heparinized saline infusion. A 4 mm x 40 mm Solitaire X retrieval device  was then deployed in the manner described above. Again  with proximal flow arrest in the right internal carotid artery proximally, and constant aspiration via the Zoom catheter which was now imbedded in the occluded right middle cerebral artery at its trifurcation region over approximately 2-1/2 minutes, the combination of the retrieval device, the microcatheter, and the Zoom catheter was retrieved and removed. Following reversal of flow arrest, a control arteriogram performed through the right internal carotid artery demonstrated improved opacification of the right MCA trifurcation branches. There continued be occluded superior division. The combination of the microcatheter, inside a 071 Zoom aspiration catheter was again advanced to the right middle cerebral artery over a 0.014 inch standard Synchro micro guidewire, which was now advanced into the superior division of the right middle cerebral artery emanating at the MCA trifurcation region. The microcatheter was advanced to the M2 region over a micro guidewire. The micro guidewire was removed. Good aspiration obtained from the hub of the microcatheter. Again with flow arrest in the right internal carotid artery proximally, and constant aspiration with the Penumbra aspiration device at the hub of the aspiration catheter following deployment of a 3 mm x 20 mm Solitaire X retrieval device, for 2 minutes, the combination of the retrieval device, the microcatheter and the Zoom catheter were retrieved and removed. The superior division continued to be occluded. The superior division was again cannulated with a microcatheter over a micro guidewire as described above. After having verified safe position of the tip of the microcatheter, in the superior division M2 region, a 4 mm x 40 mm Solitaire X retrieval device was deployed in the usual manner. Again with proximal flow arrest in the proximal right ICA, and constant aspiration at the hub of the Zoom catheter at the site of the occluded superior division in the distal right  middle cerebral M1 segment over 2 minutes, the combination of the retrieval device, the microcatheter and the retrieval device were retrieved and removed. Following reverse of flow arrest, there appeared to be modest improved flow in the proximal superior division. It was now noted the inferior division had a near occlusive clot in the proximal aspect. A fourth pass was then made this time using a combination of the Phenom microcatheter, inside of a 6 [place] Catalyst 135 cm catheter advanced over a 0.014 inch standard Synchro micro guidewire to the distal right M1 segment. The micro guidewire was then advanced into the M2 M3 region of the inferior division followed by the microcatheter. The guidewire was removed. A 4 mm x 40 mm Solitaire X retrieval device was deployed with the Catalyst guide catheter advanced at the origin of inferior division and distal to this. Following proximal flow arrest and constant aspiration for approximately 2 minutes at the hub of the Catalyst guide catheter, the combination of the retrieval device, the microcatheter and the Catalyst catheter were retrieved and removed following reversal of flow arrest. Improved flow was now noted into the inferior division proximally through the two main branch points. Following each thrombectomy, strips of fragments were seen intertwined either in the retrieval device, or in the aspiration catheters. 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 It was therefore decided to proceed with placement of a rescue stent in order to prevent occlusion of the major right MCA inferior division branch. The microcatheter was again advanced over a 0.014 inch standard Synchro micro guidewire with a 6 [place] Catalyst guide catheter. The microcatheter was advanced  over a micro guidewire without difficulty into the M2 region followed by the removal of the micro guidewire. Again good aspiration  obtained from the hub of the microcatheter. This was then connected to continuous heparinized saline infusion. Measurements performed of the right middle cerebral artery in the mid M1 segment, and also the proximal inferior division distal to the severe stenosis. A 4 mm x 24 mm Neuroform Atlas stent was advanced to the distal end of the microcatheter. The O ring on the delivery microcatheter was then loosened. With slight forward gentle traction with the right hand on the delivery micro guidewire with left hand the delivery microcatheter was gently retrieved unsheathing the distal and then the proximal portion of the stent with excellent coverage at the site of the severe stenosis. The delivery apparatus was removed. A control arteriogram performed through the balloon guide in the distal cervical ICA on the right demonstrated now significantly improved caliber and flow through the right middle cerebral artery the dominant inferior division. There was poor stagnant flow noted in the proximal superior division. Free flow through the stented segment and also the other branch of the right middle cerebral artery except for the superior division. 8 mg of Integrilin was given intra-arterially in order to prevent intra stent platelet aggregation. A 10 minute post stent arteriogram continued to demonstrate flow through the stented segment in the right MCA distribution except for the non dominant superior division. A TICI 2b revascularization was maintained. The right posterior communicating artery with the right posterior cerebral artery distribution remained unchanged. Throughout the procedure, the patient's blood pressure and neurological status remained stable. A CT of the brain performed following the third pass demonstrated no mass-effect or midline shift with mild element of contrast in the right perisylvian region. The balloon guide was then retrieved and removed. The 8 French 25 cm Pinnacle sheath was then exchanged  over a 0.035 inch J-tip guidewire for a short 8 [place] Pinnacle sheath. An arteriogram performed through this demonstrated spasm in the previously noted tortuous right common iliac artery. There was small amount of contrast noted in the soft tissue at the site of entry of the 8 [place] Pinnacle sheath which completely disappeared on repeat arteriogram performed approximately 3 minutes later. The 8 French sheath was removed and a 7 [place] ExoSeal closure device was placed. Also minimal compression was held for approximately 25 minutes. The distal pulses remained Dopplerable in the dorsalis pedis, and the posterior tibial regions bilaterally. Patient was left intubated on account of the patient's inability to communicate due to difficulty hearing and also her neurological condition. During this time, while pressure was held, and firmness was felt in the region of the pannus on the right side in the right lower quadrant, and also in the right inguinal region. In view of the unstable blood pressure, the patient was sent to CT scan for CT of the pelvis and also of the abdomen and also CT of the brain. CT of the brain revealed no evidence of mass effect or midline shift with no significant change in the right perisylvian hyperattenuation felt to be due to a contrast stain. CT of the abdomen and pelvis revealed no evidence of the overlying skin tightness. Hemodynamically the patient was now stable with blood pressure in the 150s over 80s and the heart rate being regular in the 80s to 90s sinus rhythm. Patient was then transferred to the neuro ICU intubated for post thrombectomy management. IMPRESSION: Status post endovascular revascularization of occluded right  middle cerebral M1 segment with 4 passes with different stent retrievers and [place] aspiration achieving a TICI 2B revascularization. Status post rescue stent placement from the distal M1 segment into the proximal dominant inferior division of the right middle  cerebral artery due to underlying severe intracranial arteriosclerosis. PLAN: Follow-up in the clinic 4 weeks post discharge. Electronically Signed   By: Luanne Bras M.D.   On: 12/30/2019 17:15   DG HIP PORT UNILAT WITH PELVIS 1V LEFT  Result Date: 01/02/2020 CLINICAL DATA:  Left hip pain. EXAM: DG HIP (WITH OR WITHOUT PELVIS) 1V PORT LEFT COMPARISON:  None. FINDINGS: There is no evidence of hip fracture or dislocation. There is no evidence of arthropathy or other focal bone abnormality. IMPRESSION: Negative. Electronically Signed   By: Marijo Conception M.D.   On: 01/02/2020 15:00   CT HEAD CODE STROKE WO CONTRAST  Result Date: 12/29/2019 CLINICAL DATA:  Code stroke.  Left-sided weakness EXAM: CT HEAD WITHOUT CONTRAST TECHNIQUE: Contiguous axial images were obtained from the base of the skull through the vertex without intravenous contrast. COMPARISON:  None. FINDINGS: Brain: There is no acute intracranial hemorrhage or mass effect. Gray-white differentiation is preserved. Ventricles and sulci are within normal limits in size and configuration. Patchy hypoattenuation in the supratentorial white matter is nonspecific but probably reflects chronic microvascular ischemic changes. Extra-axial dural-based hyperdense lesion along the high left frontal convexity and falx measuring 2.6 x 1.8 x 2.3 cm is consistent with a meningioma. There is no apparent underlying edema. This abuts the superior sagittal sinus. Vascular: No hyperdense vessel. Mild intracranial atherosclerotic calcification at the skull base. Skull: Unremarkable. Sinuses/Orbits: No acute abnormality. Other: Mastoid air cells are clear. ASPECTS (Benkelman Stroke Program Early CT Score) - Ganglionic level infarction (caudate, lentiform nuclei, internal capsule, insula, M1-M3 cortex): 7 - Supraganglionic infarction (M4-M6 cortex): 3 Total score (0-10 with 10 being normal): 10 IMPRESSION: No acute intracranial hemorrhage or evidence of acute  infarction. ASPECT score is 10. Chronic microvascular ischemic changes. Left frontal meningioma without significant mass effect or underlying edema. Initial results were communicated to Dr. Lorraine Lax at [place] 6/27/2021by text page via the [facility] messaging system. Electronically Signed   By: Macy Mis M.D.   On: 12/29/2019 18:20   VAS [place] LOWER EXTREMITY VENOUS (DVT)  Result Date: 01/02/2020  Lower Venous DVTStudy Indications: Swelling.  Risk Factors: None identified. Limitations: Body habitus, poor ultrasound/tissue interface and patient positioning, patient pain tolerance. Comparison Study: No prior studies. Performing Technologist: Oliver Hum RVT  Examination Guidelines: A complete evaluation includes B-mode imaging, spectral Doppler, color Doppler, and power Doppler as needed of all accessible portions of each vessel. Bilateral testing is considered an integral part of a complete examination. Limited examinations for reoccurring indications may be performed as noted. The reflux portion of the exam is performed with the patient in reverse Trendelenburg.  +-----+---------------+---------+-----------+----------+--------------+ RIGHTCompressibilityPhasicitySpontaneityPropertiesThrombus Aging +-----+---------------+---------+-----------+----------+--------------+ CFV  Full           Yes      Yes                                 +-----+---------------+---------+-----------+----------+--------------+   +---------+---------------+---------+-----------+----------+--------------+ LEFT     CompressibilityPhasicitySpontaneityPropertiesThrombus Aging +---------+---------------+---------+-----------+----------+--------------+ CFV      Full           Yes      Yes                                 +---------+---------------+---------+-----------+----------+--------------+  SFJ      Full                                                         +---------+---------------+---------+-----------+----------+--------------+ FV Prox  Full                                                        +---------+---------------+---------+-----------+----------+--------------+ FV Mid   Full                                                        +---------+---------------+---------+-----------+----------+--------------+ FV Distal               Yes      Yes                                 +---------+---------------+---------+-----------+----------+--------------+ PFV      Full                                                        +---------+---------------+---------+-----------+----------+--------------+ POP      Full           Yes      Yes                                 +---------+---------------+---------+-----------+----------+--------------+ PTV                                                   Not visualized +---------+---------------+---------+-----------+----------+--------------+ PERO                                                  Not visualized +---------+---------------+---------+-----------+----------+--------------+     Summary: RIGHT: - No evidence of common femoral vein obstruction.  LEFT: - There is no evidence of deep vein thrombosis in the lower extremity. However, portions of this examination were limited- see technologist comments above.  - No cystic structure found in the popliteal fossa.  *See table(s) above for measurements and observations. Electronically signed by Servando Snare MD on 01/02/2020 at 4:31:17 PM.    Final    ECHOCARDIOGRAM LIMITED  Result Date: 12/30/2019    ECHOCARDIOGRAM LIMITED REPORT   Patient Name:   Gloria [name] Date of Exam: 12/30/2019 Medical Rec #:  [id number]           Height:       62.0 in Accession #:    [id number]  Weight:       211.0 lb Date of Birth:  [birth year]            BSA:          1.956 m Patient Age:    74 years            BP:           132/80 mmHg  Patient Gender: F                   HR:           64 bpm. Exam Location:  Inpatient Procedure: Limited Color Doppler, Cardiac Doppler and Limited Echo Indications:    stroke 434.91  History:        Patient has prior history of Echocardiogram examinations, most                 recent 11/09/2019. Arrythmias:Atrial Fibrillation.  Sonographer:    Johny Chess Referring Phys: [id number] [place]  1. Left ventricular ejection fraction, by estimation, is >75%. The left ventricle has hyperdynamic function. The left ventricle has no regional wall motion abnormalities. There is severe left ventricular hypertrophy. Left ventricular diastolic parameters are indeterminate.  2. Right ventricular systolic function is normal. The right ventricular size is normal. There is mildly elevated pulmonary artery systolic pressure.  3. Tricuspid valve regurgitation is mild to moderate.  4. The aortic valve is abnormal. Aortic valve regurgitation is mild to moderate. Mild aortic valve sclerosis is present, with no evidence of aortic valve stenosis.  5. The inferior vena cava is normal in size with greater than 50% respiratory variability, suggesting right atrial pressure of 3 mmHg. FINDINGS  Left Ventricle: Left ventricular ejection fraction, by estimation, is >75%. The left ventricle has hyperdynamic function. The left ventricle has no regional wall motion abnormalities. The left ventricular internal cavity size was small. There is severe left ventricular hypertrophy. Right Ventricle: The right ventricular size is normal. No increase in right ventricular wall thickness. Right ventricular systolic function is normal. There is mildly elevated pulmonary artery systolic pressure. The tricuspid regurgitant velocity is 3.14  m/s, and with an assumed right atrial pressure of 3 mmHg, the estimated right ventricular systolic pressure is 99.8 mmHg. Pericardium: Trivial pericardial effusion is present. Mitral Valve: The mitral  valve is abnormal. There is mild thickening of the mitral valve leaflet(s). Moderate mitral annular calcification. Tricuspid Valve: The tricuspid valve is grossly normal. Tricuspid valve regurgitation is mild to moderate. Aortic Valve: The aortic valve is abnormal. Aortic valve regurgitation is mild to moderate. Mild aortic valve sclerosis is present, with no evidence of aortic valve stenosis. Pulmonic Valve: The pulmonic valve was normal in structure. Pulmonic valve regurgitation is not visualized. Aorta: The aortic root and ascending aorta are structurally normal, with no evidence of dilitation. Venous: The inferior vena cava is normal in size with greater than 50% respiratory variability, suggesting right atrial pressure of 3 mmHg.  LEFT VENTRICLE PLAX 2D LVIDd:         3.10 cm LVIDs:         1.60 cm LV PW:         1.70 cm LV IVS:        1.60 cm LVOT diam:     1.90 cm LVOT Area:     2.84 cm  IVC IVC diam: 1.90 cm LEFT ATRIUM         Index LA diam:    4.70 cm 2.40  cm/m   AORTA Ao Root diam: 3.50 cm Ao Asc diam:  3.50 cm TRICUSPID VALVE TR Peak grad:   39.4 mmHg TR Vmax:        314.00 cm/s  SHUNTS Systemic Diam: 1.90 cm Dorris Carnes MD Electronically signed by Dorris Carnes MD Signature Date/Time: 12/30/2019/7:38:12 PM    Final        HISTORY OF PRESENT ILLNESS Gloria [name] is an 74 y.o. female PMH HTN, a. Fib (not anticoagulated) presented to [facility] ED as a code stroke for c/o left side flaccid, right gaze. Per EMS patient was at home with her brother and was at her baseline at 80 on 12/29/2019 when he left to go to the store. When he came back she was lying on the ground with her left side flaccid. 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 Patient is not on any blood thinners. Over the past 6 weeks has had multiple admissions for bilateral ureter stents. CTH: did not reveal a hemorrhage.  Left frontal meningioma. CTA: right M1 occlusion. BP: 160/81 BG: 106. NIHSS:23.  IV tPA was given.  Modified Rankin Score=3. Taken to IR for R M1 occlusion.     HOSPITAL COURSE Gloria [name] is a 74 y.o. female with history of bilateral ureteral calculi, HTN, Atrial Fibrillation not on anticoagulation (not on [facility] due to high risk of falls), who presents with left sided hemiplegia and right gaze preference. CTH in the ED revealed left frontal meningioma, CTA with a right M1 occlusion. She received IVTPA on 12/29/19 at 1830 w/LKW 1645 and was taken for mechanical thrombectomy with resulting TICI 2B revascularization w/placement of recue stent in the right MCA.   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 Small vessel disease. Atrophy. ASPECTS 10.     CTA head & neck R M1 occlusion, fetal PCA on the right  Cerebral angio R M1 occlusion w/p TICI 2b revascularization. 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    Post IR CT hyperdensity R subarachnoid space, stable L parafalcine meningioma  MRI  R occipital infarct. R insular and frontal operculum infarct. Small scattered R frontal and parietal infarcts.  MRA  fetal R PCA. Missing superior division R MCA. R MCA stent, inferior division with flow.  LE Doppler  No DVT  2D Echo EF > 75%, severe LVH. Mild to moderate TVR.  LDL 90  HgbA1c 5.2  aspirin 325 mg daily prior to admission, treated w/ aspirin 81 mg daily and Brilinta (ticagrelor) 90 mg bid given stent placement.   Therapy recommendations:  CIR  Patient with poor medical progression in setting on long-term disabilities from stroke. Discussion with brother. Palliative care consulted who had ongoing conversations with family, who ultimately decided on full comfort care for her  Disposition:  Hospice Home    Adynamic Ileus/SBO w/ severe hypokalemia  Extremely distended Abd  KUB 7/3 w/  dilated small bowel loop c/w ileus  Stop TF and po intake except aspirin and brilinta  Pt w/ 2 BMs over the weekend  Nausea w/ vomiting Monday am  IM consulted.   Attempted Decompression w/ NG   Repleted electrolytes    Acute Respiratory Failure, resolved Acute Mild Respiratory Distress, improved  intubated for IR, left intubated for  airway protection  Extubation 6/29  Tolerated well the developed Acute mild respiratory distress later  Multifactorial suspect secondary to mild fluid overload versus reactive airway.  BNP slightly elevated at 500, chest x-ray shows bronchitic changes.  Treated w/ bronchodilators, Pulmicort. Hydration with caution.    Hematoma R groin  pseudoanerysm w/ extravasation following IR  CT abd & pelvis R inguinal hematoma into abdominal wall. Stable B ureteral stents and B adrenal adenoma. Aortic atherosclerosis.    S/p 1u PRBC for Hgb 7.3  [place] right groin x 2 - no pseudoaneurysm   Last Hgb 9.4    Hematuria, resolved  S/p B ureteral stents exchange 6/4 d/t B renal calculi  ABD CT Mild B hydronephrosis, stents in place  Hgb 9.2->8.8->8.4->7.8->7.3 - 1u PRBC - 8.7->8.6->9.3->10.4->9.4   Urology consulted, appreciate help  Ureteral stents removed by urology 6/30 followed by 1 dose cipro  No gross hematuria   Last Cre 1.29    Atrial Fibrillation  Home anticoagulation:  none d/t high fall risk  No AC in hospital d/t anemia   Left leg swelling  LE venous doppler no DVT  XR of left knee severe OA and hip neg  Treated w/ heparin subq and SCDs for DVT prophylaxis during hospitalization  Hypertension  Home meds:  Coreg 6.25 bid   Hyperlipidemia  Home meds:  No statin  LDL 90, goal < 70  Treated w/ lipitor 40 IP   Dysphagia  Secondary to stroke  Cleared for dys1 with nectar thick, poor intake  Cortrak, now out  Treated w/ TF + IVF to NS w/ 20K at 70h   CKD stage IIIab  Cre 1.49  Could be due to contrast  nephrotoxicity  Increased IVF   Other Stroke Risk Factors  Advanced age  Obesity, Body mass index is 38.59 kg/m   Other Active Problems  Urinary incontinence on mirabegron PTA  HOH  Transient bradycardia w/ HR to 33 on 7/5  Persistent hypokalemia w/ ongoing replacement  Mild hyponatremia/hypochloremia  DISCHARGE EXAM Blood pressure (!) 150/78, pulse 75, temperature 98.2 F (36.8 C), temperature source Axillary, resp. rate 18, weight 89.9 kg, SpO2 97 %. Elderly Caucasian lady seems to be in mild distress due to abdominal distention. . Afebrile. Head is nontraumatic. Neck is supple without bruit.    Cardiac exam no murmur or gallop. Lungs are clear to auscultation. Distal pulses are well felt.  She is extremely hard of hearing.  Abdomen greatly distended.  Bowel sounds very diminished.  Slight tenderness over abdomen diffusely Neurological Exam :  Patient is deaf and difficult to communicate with.  She is lying in bed.  She has right gaze preference and head deviation.  She follows simple commands.  Speech is slow but clear.  Right gaze deviation.  Does not blink to threat on the left but does so on the right.  Left lower facial weakness.  Tongue midline.  Left upper extremity is flaccid and weak.  Withdraws left lower extremity to pain.  Purposeful antigravity movements on the right side.  Discharge Diet  Comfort feeds  DISCHARGE PLAN  Disposition:  Transfer to [facility] for ongoing comfort care  Meds per [place] of facility DNR signed  35 minutes were spent preparing discharge.  Burnetta Sabin, MSN, APRN, ANVP-BC, AGPCNP-BC Advanced Practice Stroke Nurse [place] for Schedule & Pager information 01/10/2020 12:24 PM  I have personally obtained history,examined this patient, reviewed notes, independently viewed imaging studies, participated  in medical decision making and plan of care.ROS completed by me personally and pertinent positives  fully documented  I have made any additions or clarifications directly to the above note. Agree with note above. Antony Contras, MD Medical Director [facility] Pager: [phone] 01/10/2020 3:43 PM

## 2020-01-10 NOTE — Progress Notes (Signed)
Pt's. IV removed; site is clean, dry and intact. Report called to Helene Kelp, Therapist, sports at [facility]. Discharge instructions placed in packet for receiving facility. Personal belongings packed by nursing staff and transported with patient. Patient transported via stretcher by [facility] to Hospice.

## 2020-01-10 NOTE — Progress Notes (Signed)
Patient is now comfort care no further medical treatment per family. I will be available for any questions if needed while the patient is in the hospital otherwise continue comfort care per primary team.  Please call with any further questions as needed.  Gerlean Ren MD [facility]

## 2020-02-12 DIAGNOSIS — R109 Unspecified abdominal pain: Secondary | ICD-10-CM

## 2020-02-12 DIAGNOSIS — R140 Abdominal distension (gaseous): Secondary | ICD-10-CM

## 2020-03-04 DEATH — deceased

## 2020-10-19 IMAGING — DX DG KNEE 1-2V PORT*L*
1 series · 2 of 2 positions shown · non-contrast
Comparison: None.

CLINICAL DATA: Left knee pain.

EXAM:
PORTABLE LEFT KNEE - 1-2 VIEW

[Series 1: knee · 0.14mm/px · 2 of 2 slices shown]
[im 1/2]
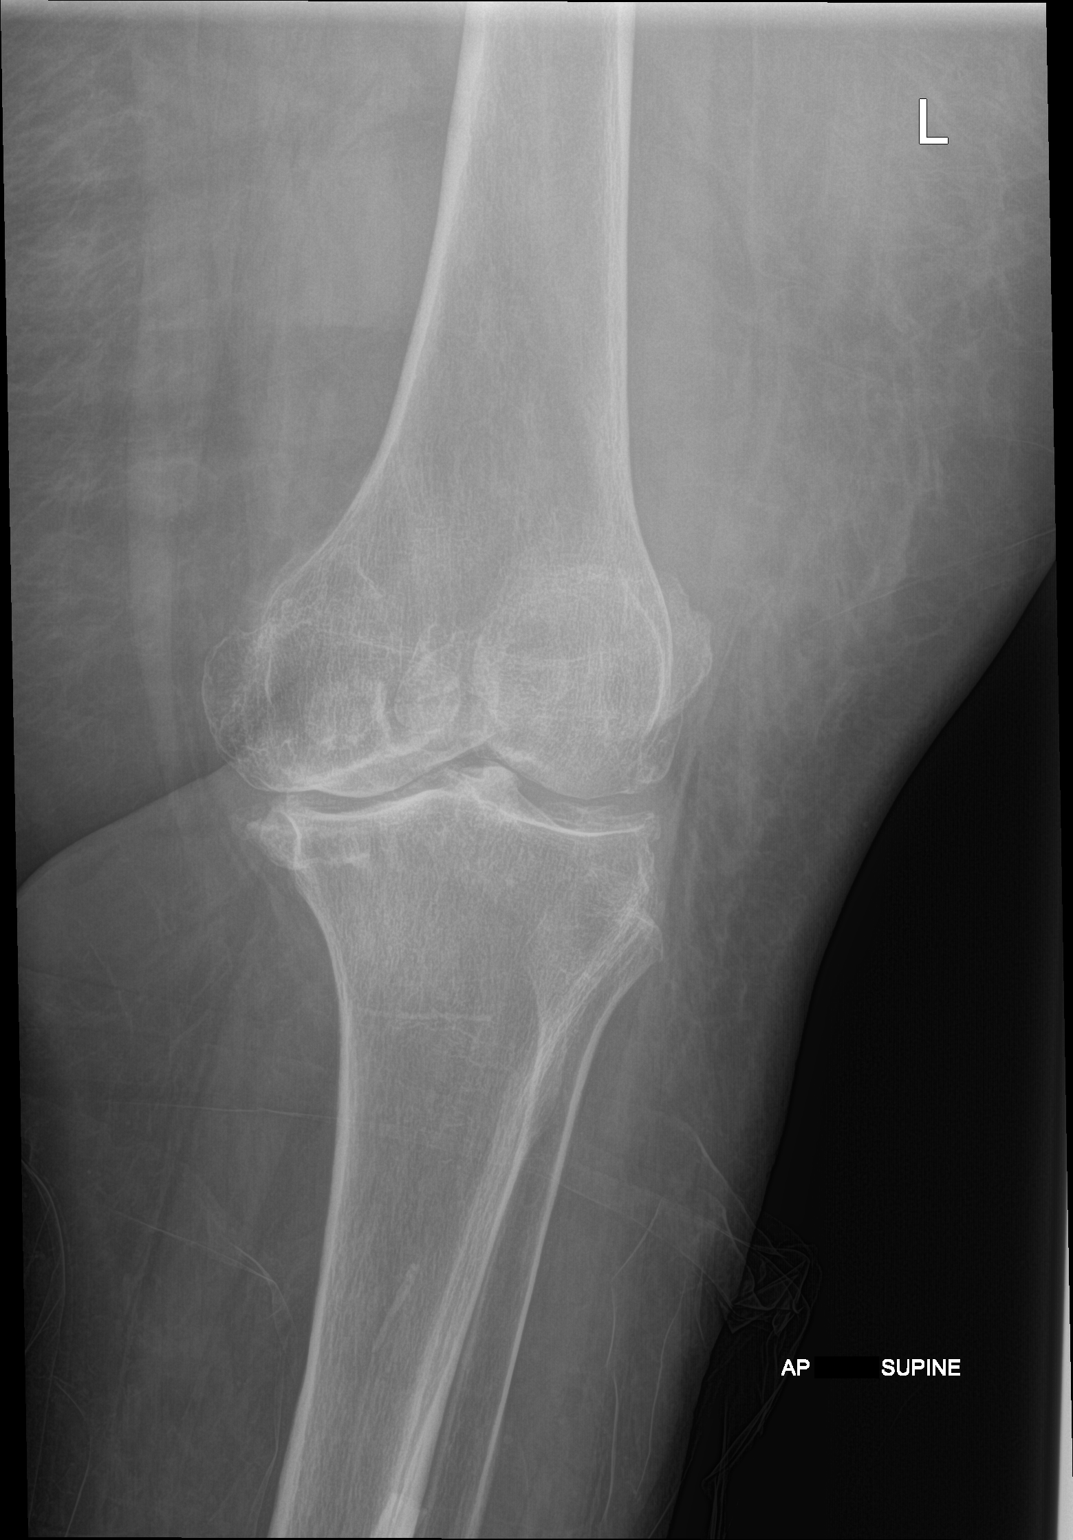
[im 2/2]
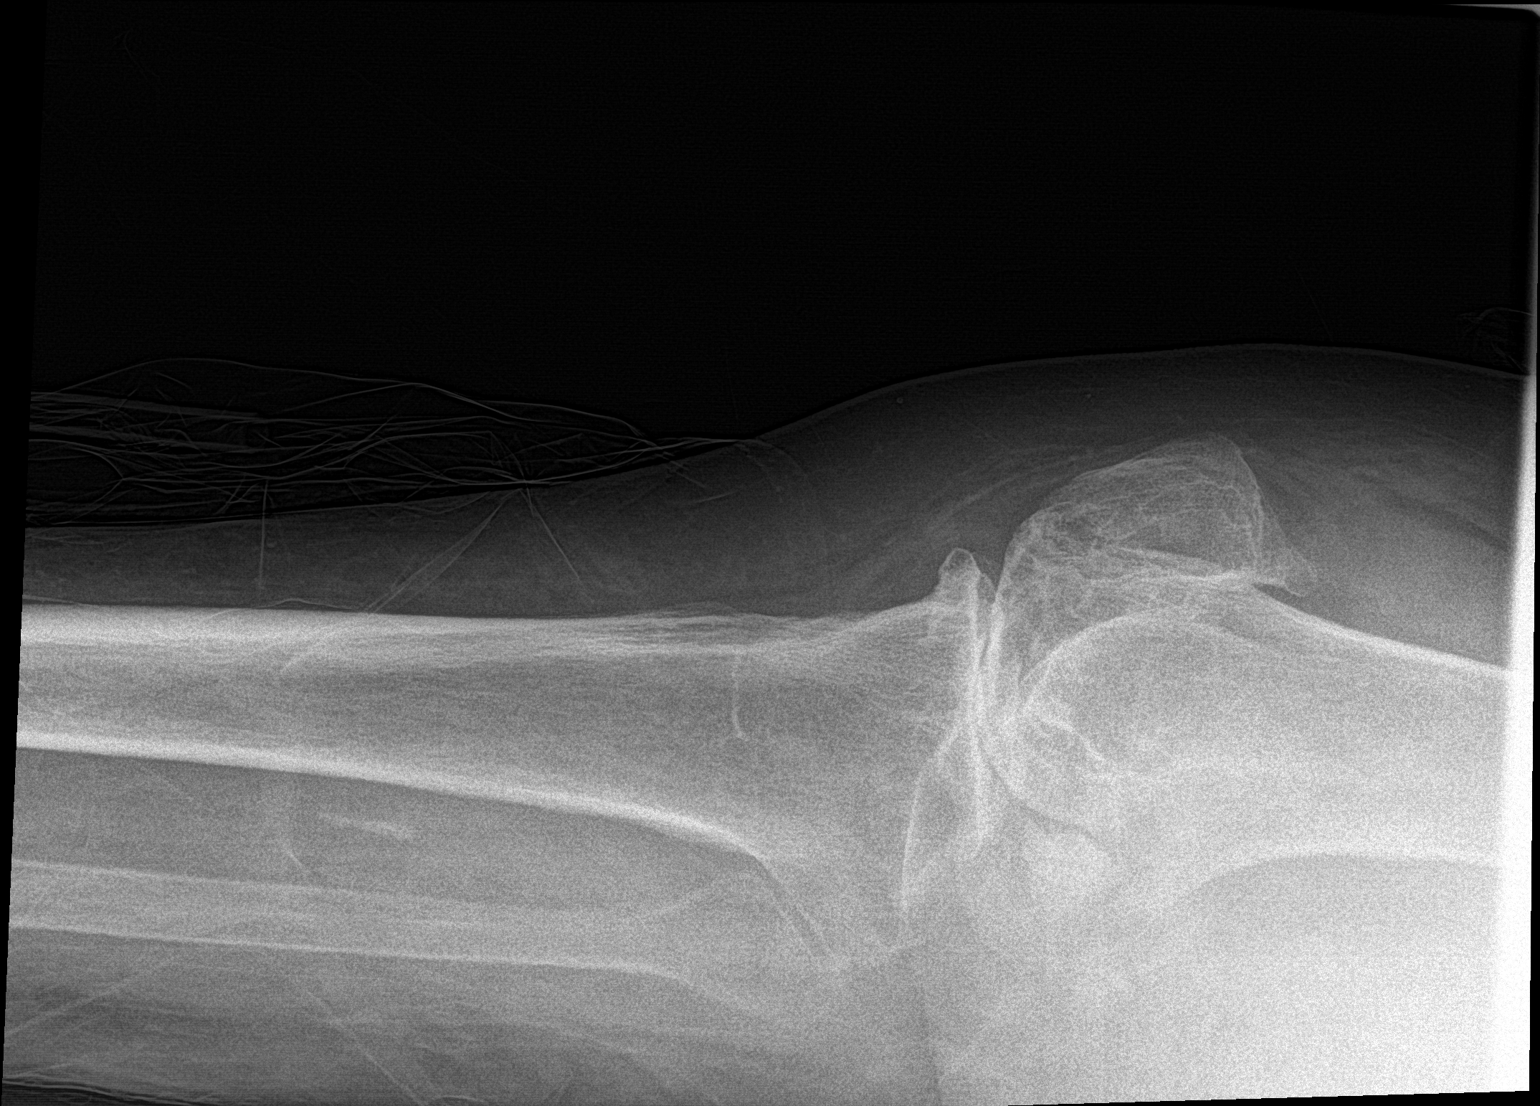

[2 of 2 positions shown; findings below may reference images not displayed]

FINDINGS: No evidence of fracture, dislocation, or joint effusion. Severe
degenerative changes seen involving the medial joint space. Moderate
degenerative changes seen involving the lateral joint space. Severe
degenerative changes seen involving the patellofemoral space. Soft
tissues are unremarkable.
IMPRESSION: Severe tricompartmental osteoarthritis. No acute abnormality seen in
the left knee.

## 2020-10-19 IMAGING — DX DG HIP (WITH OR WITHOUT PELVIS) 1V PORT*L*
2 series · 3 of 3 positions shown · non-contrast
Comparison: None.

CLINICAL DATA: Left hip pain.

EXAM:
DG HIP (WITH OR WITHOUT PELVIS) 1V PORT LEFT

[Series 1: pelvis · 0.14mm/px · 2 of 2 slices shown]
[im 1/2]
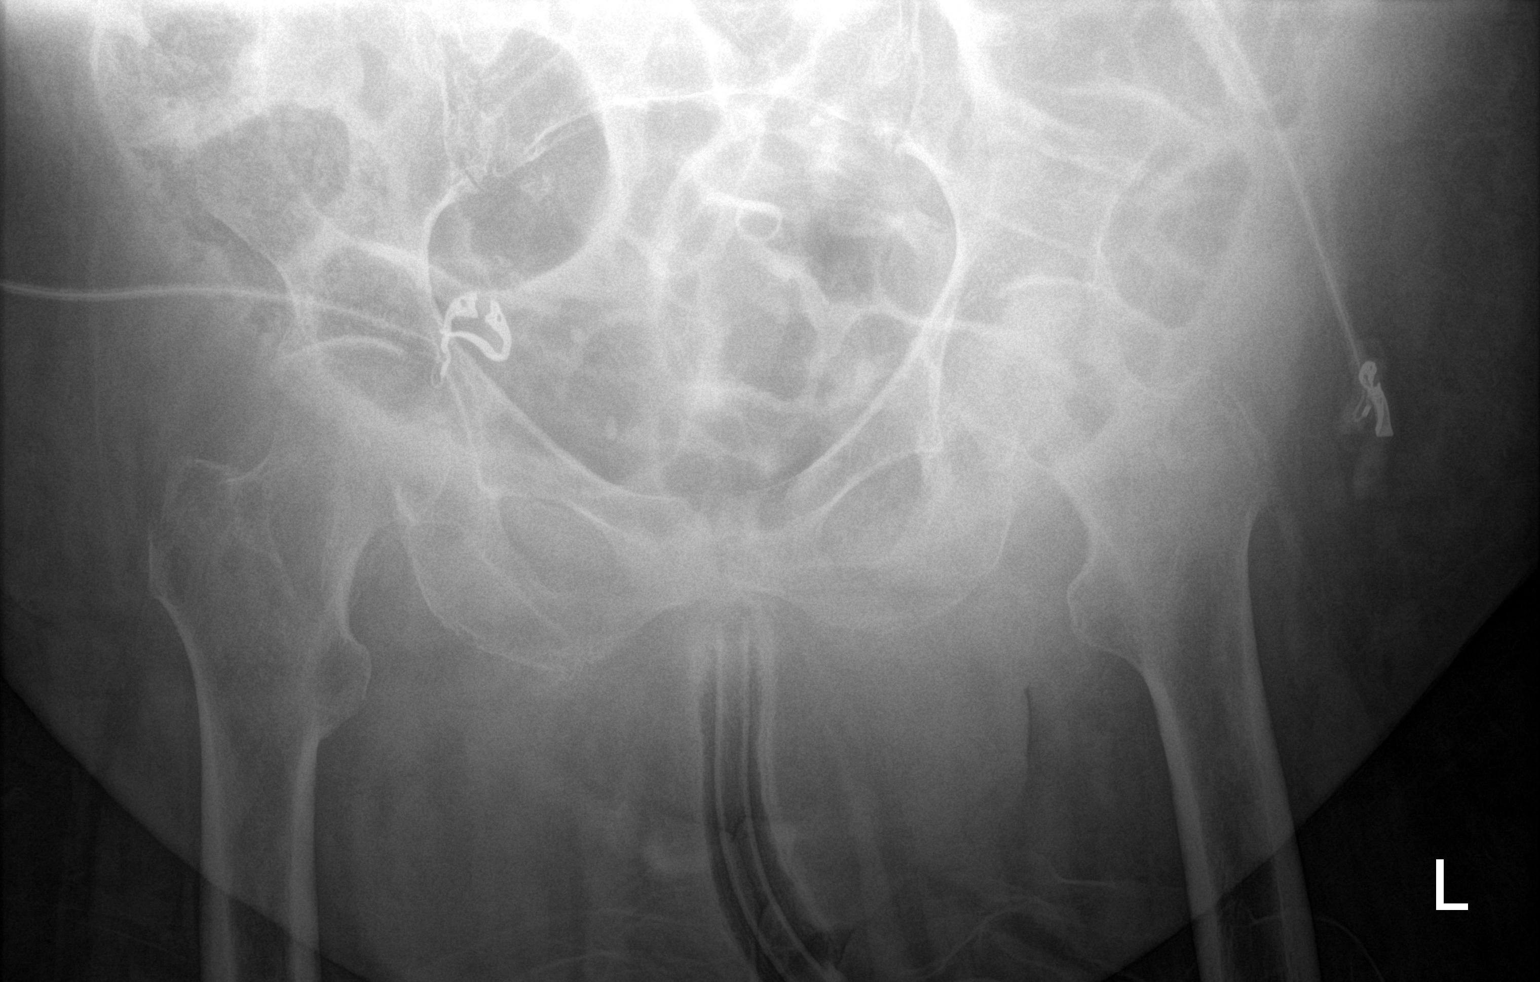
[im 2/2]
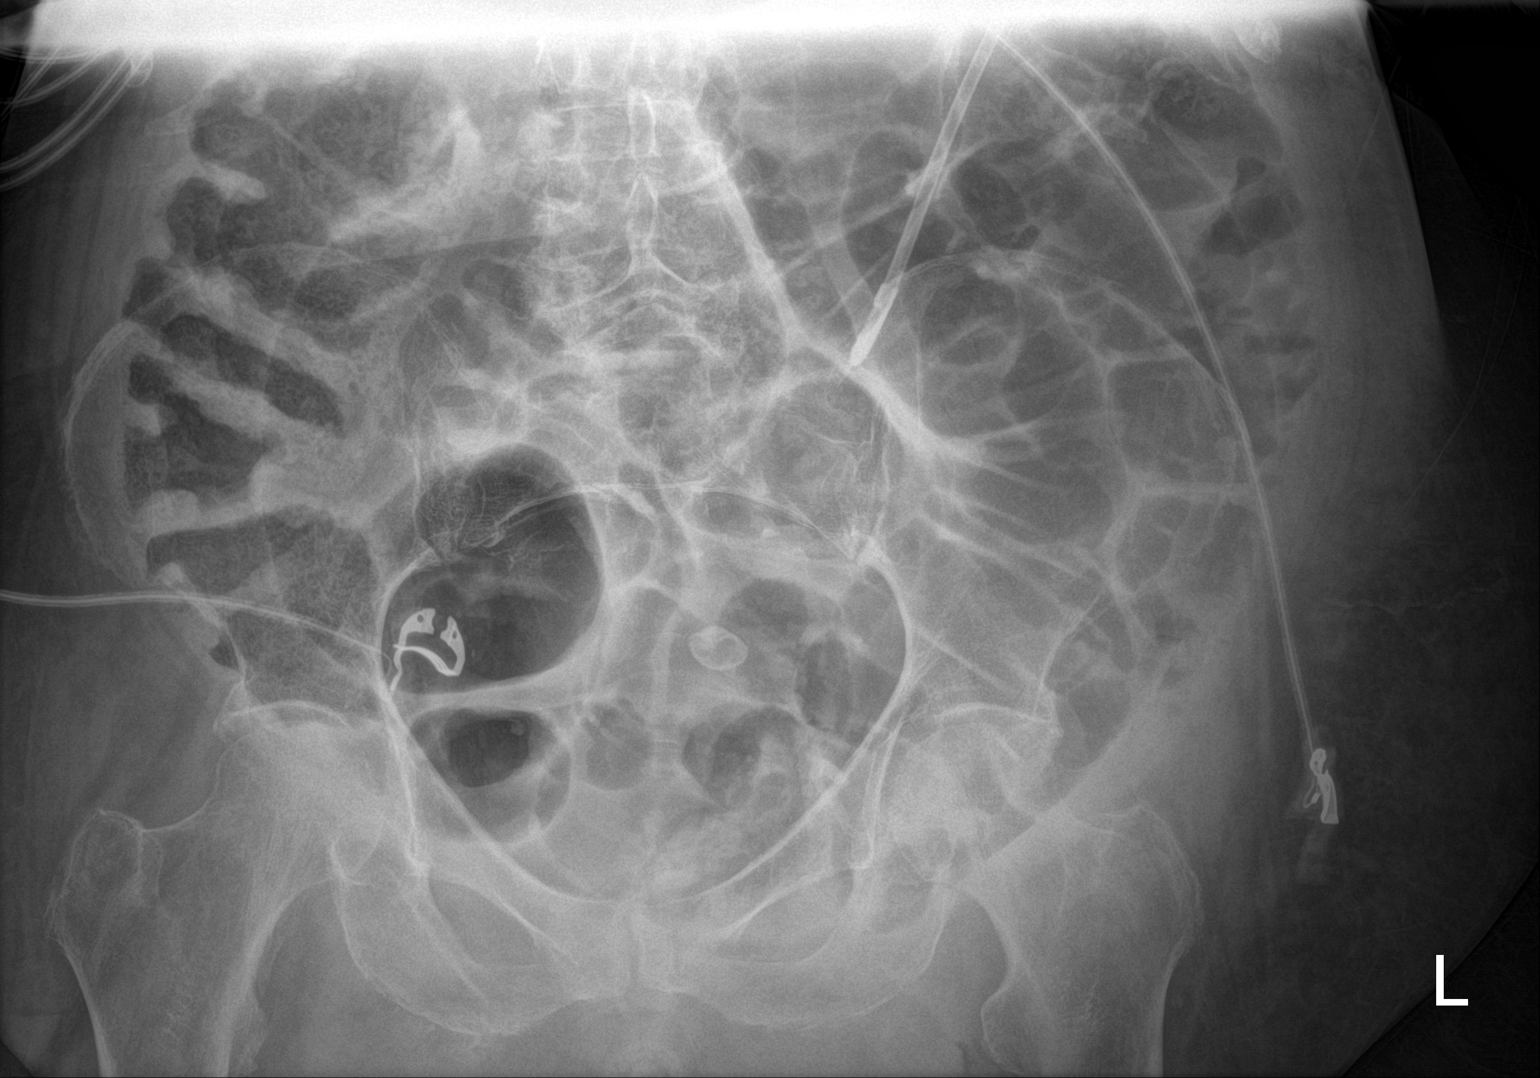

[hip]
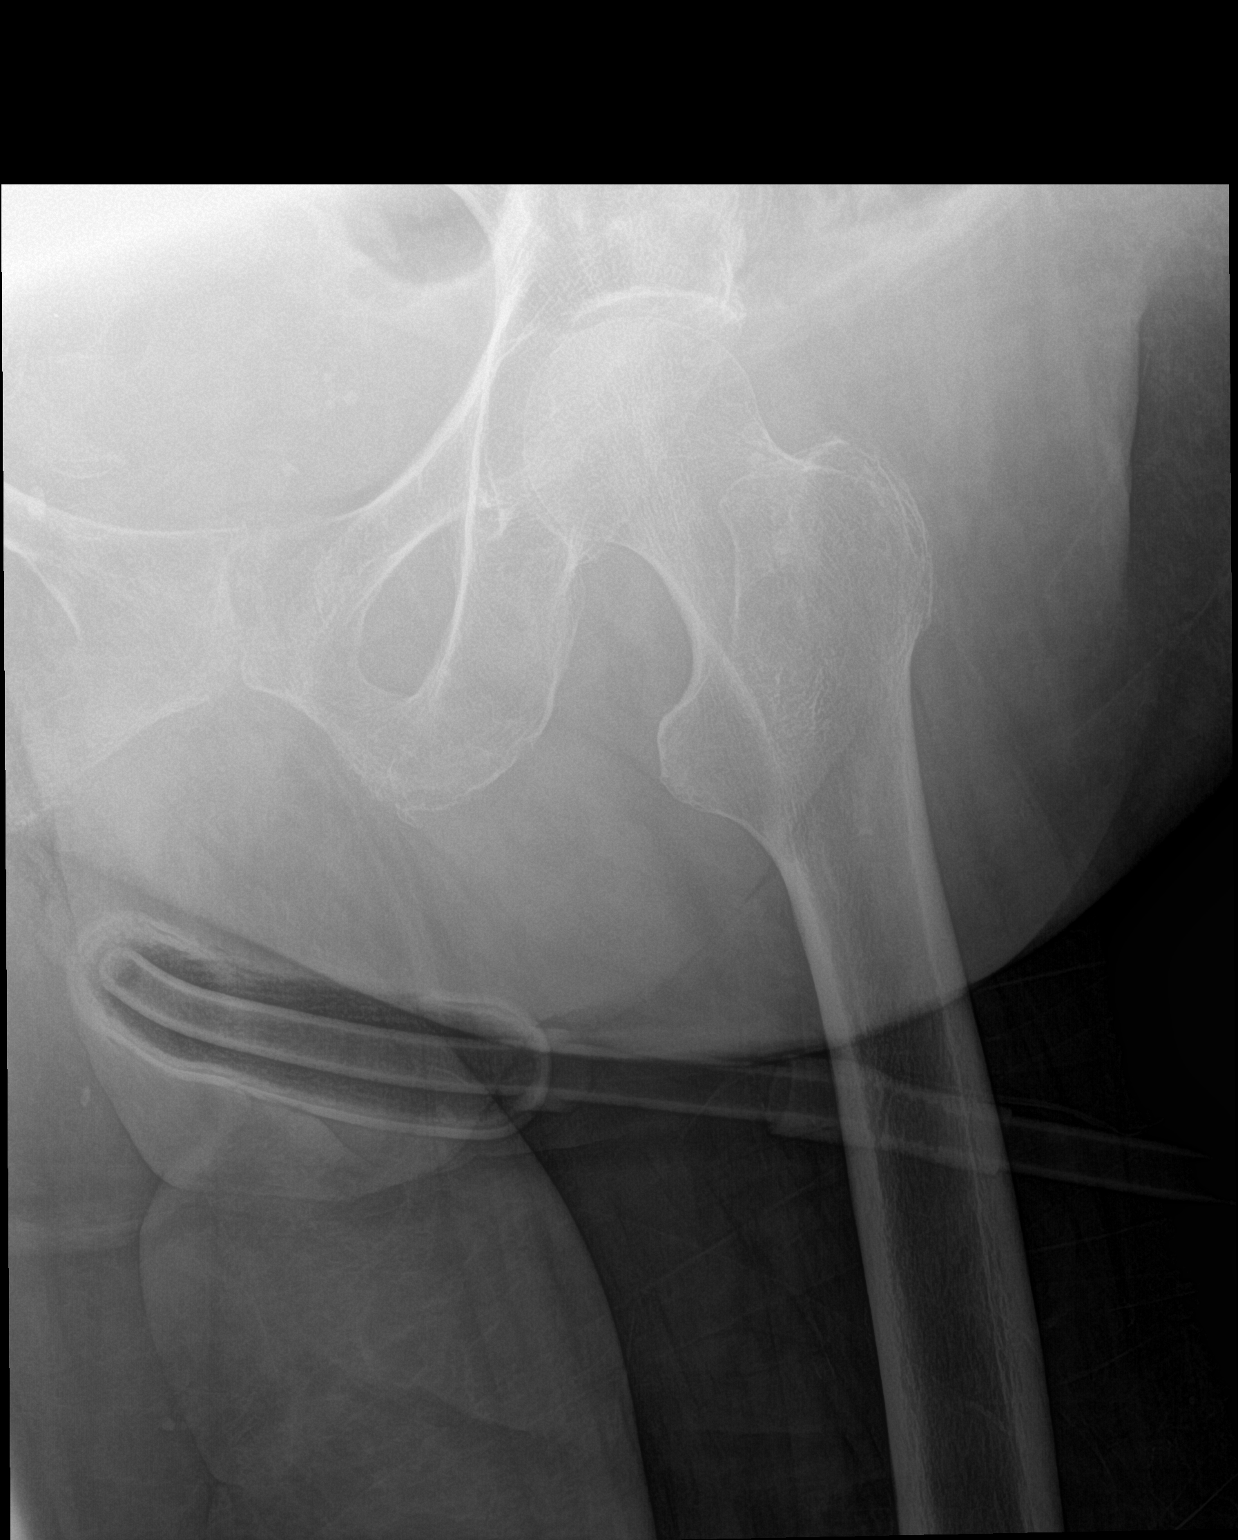

[3 of 3 positions shown; findings below may reference images not displayed]

FINDINGS: There is no evidence of hip fracture or dislocation. There is no
evidence of arthropathy or other focal bone abnormality.
IMPRESSION: Negative.

## 2020-10-25 IMAGING — CR DG ABDOMEN ACUTE W/ 1V CHEST
3 series · 3 of 3 positions shown · non-contrast
Comparison: Abdominal x-rays 01/07/2020 and earlier, including CT
abdomen and pelvis 12/29/2019. No prior chest x-ray.

CLINICAL DATA: Follow-up ileus.  Diarrhea.

EXAM:
DG ABDOMEN ACUTE W/ 1V CHEST

[chest pa]
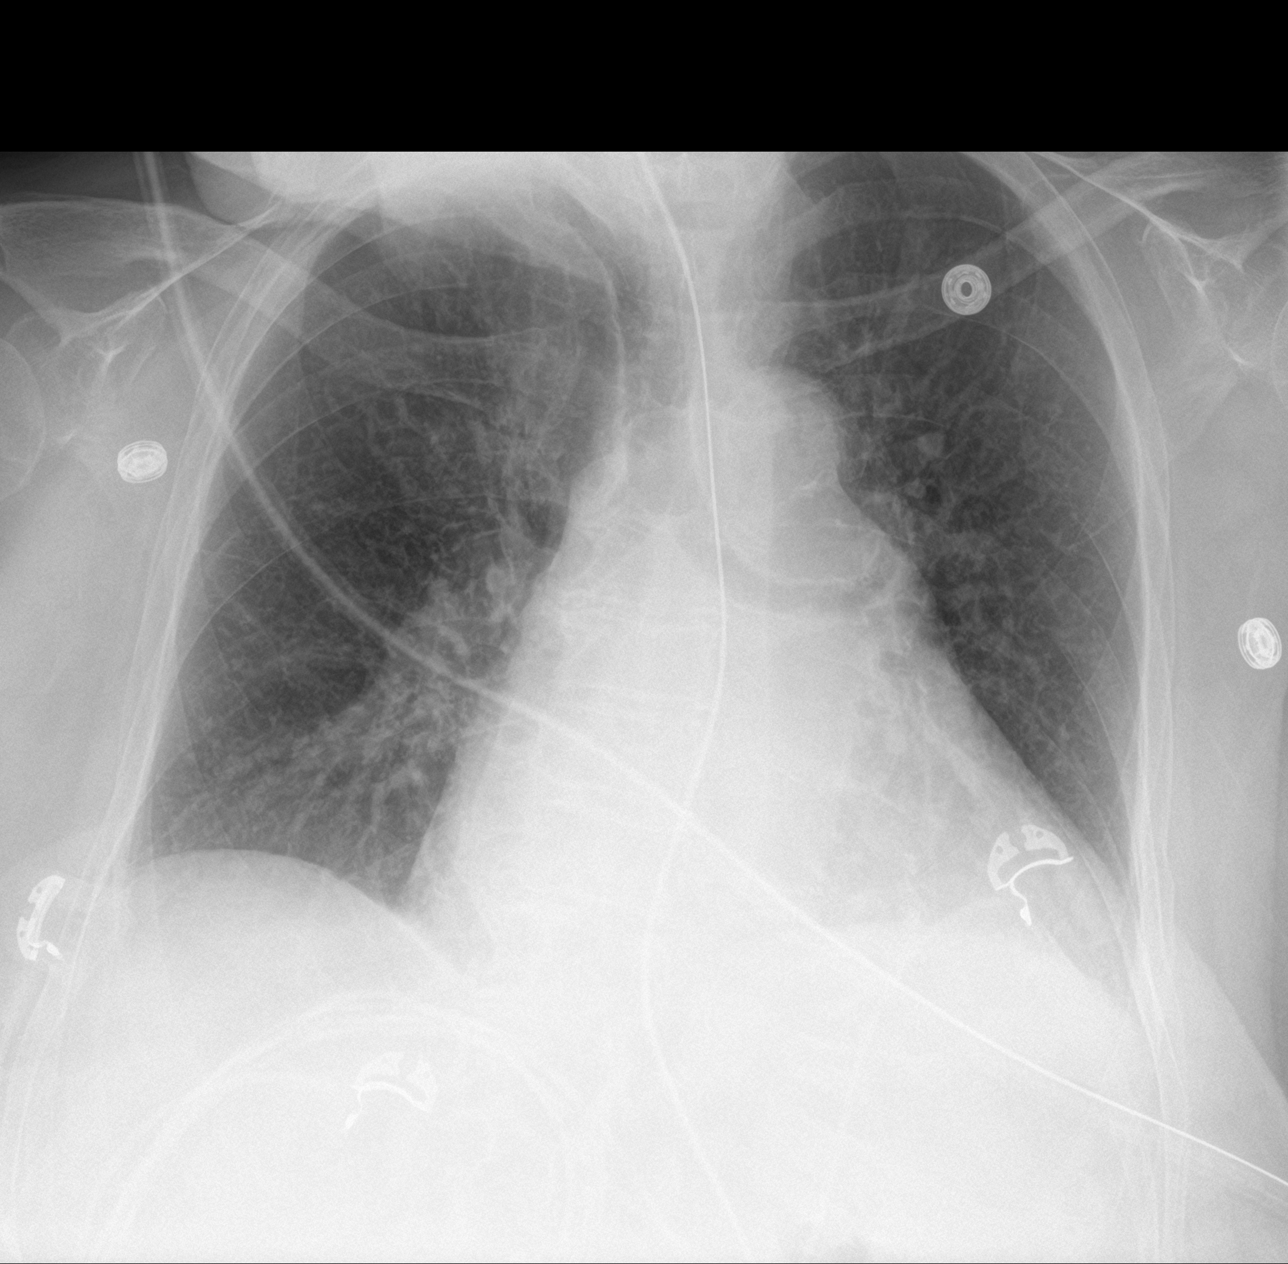

[abdomen erect]
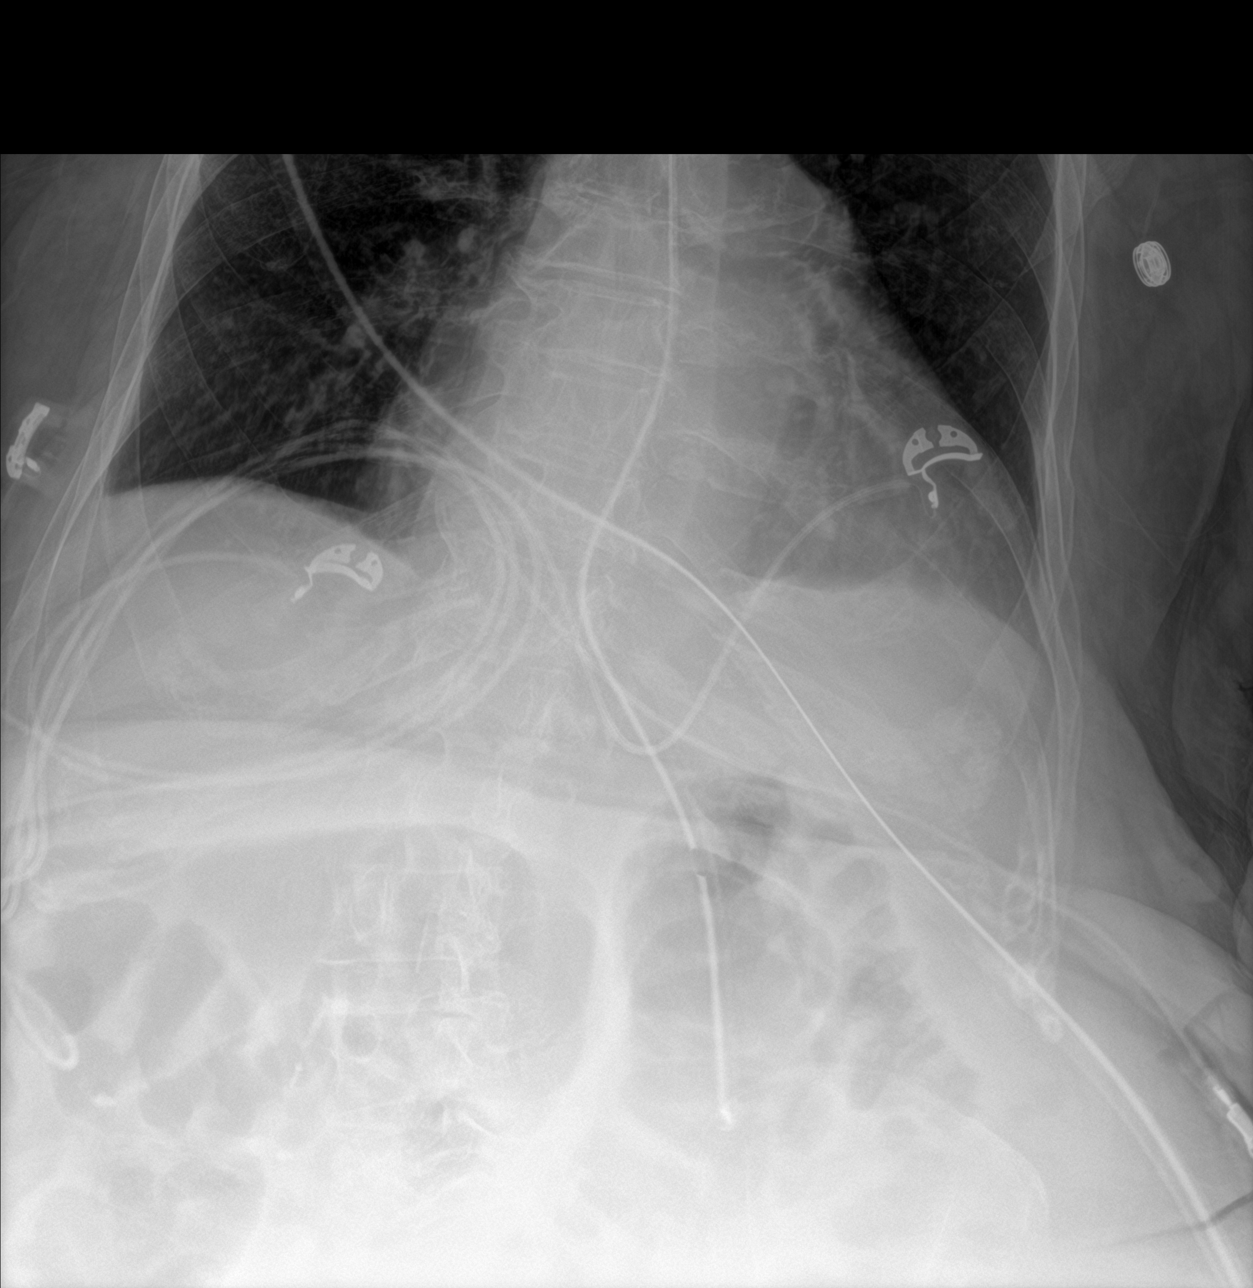

[abdomen supine]
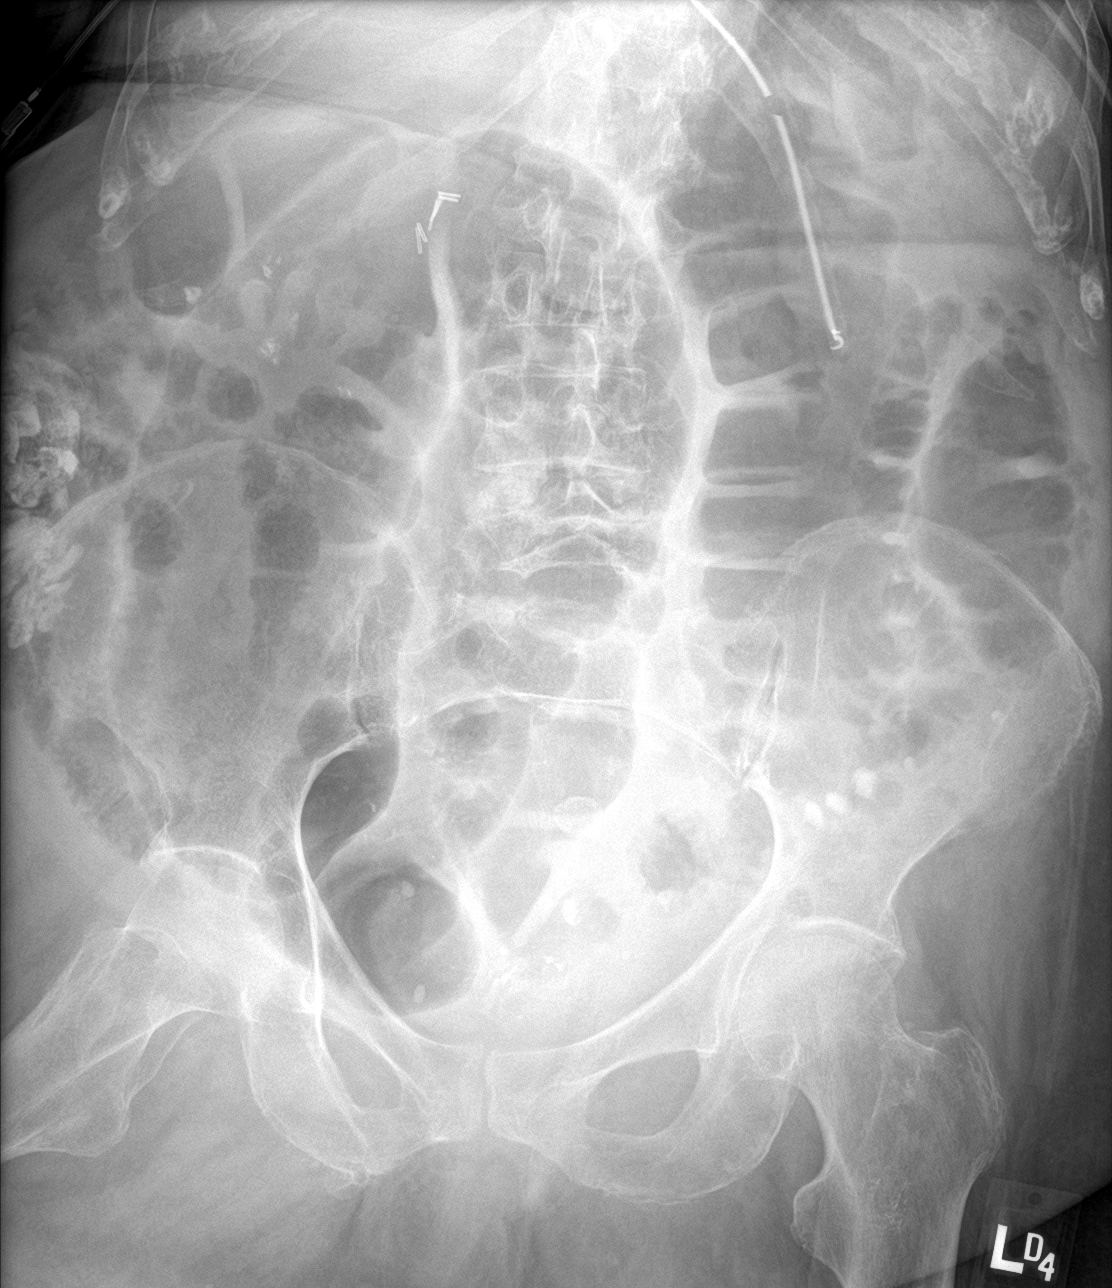

[3 of 3 positions shown; findings below may reference images not displayed]

FINDINGS: Moderate gaseous distension of the ascending and transverse colon,
not significantly changed since yesterday. The gaseous distension of
the small bowel has improved. No evidence of free intraperitoneal
air on the ERECT image. Nasogastric tube tip projects over the
expected location of the mid stomach, unchanged. Contrast material
in sigmoid colon diverticula is again noted.

Cardiac silhouette mildly to moderately enlarged for AP technique.
Moderate central peribronchial thickening. Lungs otherwise clear.
Mild pulmonary venous hypertension without overt edema.
IMPRESSION: 1. Stable moderate colonic ileus. Improved small bowel ileus. No
free intraperitoneal air.
2. Stable cardiomegaly. Mild pulmonary venous hypertension without
overt edema. Moderate changes of bronchitis and/or asthma without
focal airspace pneumonia.
